# Patient Record
Sex: Female | Born: 1970 | Race: White
Health system: Southern US, Community
[De-identification: ages and names within clinical notes are randomized; demographics above are authoritative.]

## PROBLEM LIST (undated history)

## (undated) ENCOUNTER — Emergency Department (HOSPITAL_COMMUNITY): Admission: EM | Discharge status: Absent without leave | Payer: Self-pay

## (undated) DIAGNOSIS — F410 Panic disorder [episodic paroxysmal anxiety] without agoraphobia: Secondary | ICD-10-CM

## (undated) DIAGNOSIS — J449 Chronic obstructive pulmonary disease, unspecified: Secondary | ICD-10-CM

## (undated) DIAGNOSIS — F419 Anxiety disorder, unspecified: Secondary | ICD-10-CM

## (undated) DIAGNOSIS — M549 Dorsalgia, unspecified: Secondary | ICD-10-CM

## (undated) DIAGNOSIS — F32A Depression, unspecified: Secondary | ICD-10-CM

## (undated) DIAGNOSIS — G8929 Other chronic pain: Secondary | ICD-10-CM

## (undated) DIAGNOSIS — F329 Major depressive disorder, single episode, unspecified: Secondary | ICD-10-CM

## (undated) HISTORY — PX: APPENDECTOMY (OPEN): SHX54

## (undated) HISTORY — PX: ARTHROSCOPIC ASSISTED, KNEE, ANTERIOR CRUCIATE LIGAMENT (ACL) RECONSTRUCTION, ALLOGRAFT: SHX3152

## (undated) HISTORY — PX: CHOLECYSTECTOMY: SHX55

---
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
## 1998-11-20 DIAGNOSIS — G8929 Other chronic pain: Secondary | ICD-10-CM

## 1998-11-20 HISTORY — DX: Other chronic pain: G89.29

## 2001-11-20 HISTORY — PX: CHOLECYSTECTOMY: SHX55

## 2014-03-09 ENCOUNTER — Encounter (HOSPITAL_COMMUNITY): Payer: Self-pay | Admitting: Emergency Medicine

## 2014-03-09 ENCOUNTER — Emergency Department (HOSPITAL_COMMUNITY)
Admission: EM | Admit: 2014-03-09 | Discharge: 2014-03-09 | Disposition: A | Discharge status: Home / Self Care | Payer: Medicaid - Out of State | Attending: Emergency Medicine | Admitting: Emergency Medicine

## 2014-03-09 ENCOUNTER — Emergency Department (HOSPITAL_COMMUNITY): Payer: Medicaid - Out of State

## 2014-03-09 DIAGNOSIS — F411 Generalized anxiety disorder: Secondary | ICD-10-CM | POA: Insufficient documentation

## 2014-03-09 DIAGNOSIS — M25569 Pain in unspecified knee: Secondary | ICD-10-CM | POA: Insufficient documentation

## 2014-03-09 DIAGNOSIS — F17200 Nicotine dependence, unspecified, uncomplicated: Secondary | ICD-10-CM | POA: Insufficient documentation

## 2014-03-09 DIAGNOSIS — M898X9 Other specified disorders of bone, unspecified site: Secondary | ICD-10-CM | POA: Insufficient documentation

## 2014-03-09 DIAGNOSIS — F3289 Other specified depressive episodes: Secondary | ICD-10-CM | POA: Insufficient documentation

## 2014-03-09 DIAGNOSIS — F329 Major depressive disorder, single episode, unspecified: Secondary | ICD-10-CM | POA: Insufficient documentation

## 2014-03-09 DIAGNOSIS — M25469 Effusion, unspecified knee: Secondary | ICD-10-CM | POA: Insufficient documentation

## 2014-03-09 DIAGNOSIS — M779 Enthesopathy, unspecified: Secondary | ICD-10-CM

## 2014-03-09 DIAGNOSIS — G8929 Other chronic pain: Secondary | ICD-10-CM | POA: Insufficient documentation

## 2014-03-09 DIAGNOSIS — G8918 Other acute postprocedural pain: Secondary | ICD-10-CM | POA: Insufficient documentation

## 2014-03-09 DIAGNOSIS — M25561 Pain in right knee: Secondary | ICD-10-CM

## 2014-03-09 DIAGNOSIS — M76891 Other specified enthesopathies of right lower limb, excluding foot: Secondary | ICD-10-CM

## 2014-03-09 DIAGNOSIS — Z9889 Other specified postprocedural states: Secondary | ICD-10-CM | POA: Insufficient documentation

## 2014-03-09 HISTORY — DX: Major depressive disorder, single episode, unspecified: F32.9

## 2014-03-09 HISTORY — DX: Depression, unspecified: F32.A

## 2014-03-09 HISTORY — DX: Anxiety disorder, unspecified: F41.9

## 2014-03-09 HISTORY — DX: Dorsalgia, unspecified: M54.9

## 2014-03-09 HISTORY — DX: Other chronic pain: G89.29

## 2014-03-09 MED ORDER — OXYCODONE-ACETAMINOPHEN 5-325 MG PO TABS
2.0000 | ORAL_TABLET | Freq: Once | ORAL | Status: AC
  Administered 2014-03-09: 2 via ORAL
  Filled 2014-03-09: qty 2

## 2014-03-09 MED ORDER — OXYCODONE-ACETAMINOPHEN 5-325 MG PO TABS: 1.0000 | ORAL_TABLET | Freq: Four times a day (QID) | ORAL | Status: DC | PRN

## 2014-03-09 MED ORDER — MELOXICAM 7.5 MG PO TABS: 15.0000 mg | ORAL_TABLET | Freq: Every day | ORAL | Status: DC

## 2014-03-09 NOTE — Discharge Instructions (Signed)
°Emergency Department Resource Guide °1) Find a Doctor and Pay Out of Pocket °Although you won't have to find out who is covered by your insurance plan, it is a good idea to ask around and get recommendations. You will then need to call the office and see if the doctor you have chosen will accept you as a new patient and what types of options they offer for patients who are self-pay. Some doctors offer discounts or will set up payment plans for their patients who do not have insurance, but you will need to ask so you aren't surprised when you get to your appointment. ° °2) Contact Your Local Health Department °Not all health departments have doctors that can see patients for sick visits, but many do, so it is worth a call to see if yours does. If you don't know where your local health department is, you can check in your phone book. The CDC also has a tool to help you locate your state's health department, and many state websites also have listings of all of their local health departments. ° °3) Find a Walk-in Clinic °If your illness is not likely to be very severe or complicated, you may want to try a walk in clinic. These are popping up all over the country in pharmacies, drugstores, and shopping centers. They're usually staffed by nurse practitioners or physician assistants that have been trained to treat common illnesses and complaints. They're usually fairly quick and inexpensive. However, if you have serious medical issues or chronic medical problems, these are probably not your best option. ° °No Primary Care Doctor: °- Call [facility] at  832-8000 - they can help you locate a primary care doctor that  accepts your insurance, provides certain services, etc. °- Physician Referral Service- 1-800-533-3463 ° °Chronic Pain Problems: °Organization         Address  Phone   Notes  °[facility]  (336) 297-2271 Patients need to be referred by their primary care doctor.  ° °Medication  Assistance: °Organization         Address  Phone   Notes  °[place] [facility] [address] °[place], [place] [address] (336) 641-8030 --Must be a resident of [place] °-- Must have NO insurance coverage whatsoever (no Medicaid/ Medicare, etc.) °-- The pt. MUST have a primary care doctor that directs their care regularly and follows them in the community °  °[facility]  (866) 331-1348   °[employer]  (888) 892-1162   ° °Agencies that provide inexpensive medical care: °Organization         Address  Phone   Notes  °[facility]  (336) 832-8035   °[facility]    (336) 832-7272   °[facility] [address] °[place], [place] [address] (336) 832-4777   °[facility] [address], °[place] (336) 271-4999   °[facility]    (336) 373-0678   °[facility]    (336) 272-1050   °[facility] ° [address], [place] Phone:  (336) 832-4444, Fax:  (336) 832-4440 Hours of Operation:  9 am - 6 pm, M-F.  Also accepts Medicaid/Medicare and self-pay.  °[facility] ° [address], [place] Phone: (336) 832-3150, Fax: (336) 832-3151. Hours of Operation:  8:30 am - 5:30 pm, M-F.  Also accepts Medicaid and self-pay.  °[facility] [place] [address], [place] Phone: (336) 878-6027   °[facility] [address], [place], [place] (336)723-1848, Ext. 123 Mondays & Thursdays: 7-9 AM.  First 15 patients are seen on a first come, first serve basis. °  ° °Medicaid-accepting [place] Providers: ° °Organization         Address  Phone   Notes  °[facility] [address], [place] (336) 641-2100 Also accepts self-pay patients.  °[facility] [address], [place] ° (336) 856-9996   °[facility] [address], [place]  (336) 288-8857   °[facility] [address] [place] [address], [place] (336) 299-7000   °[facility] [address], [place]  ° (336) 373-1557 Only accepts [place] Access Medicaid patients after they have their name applied to their card.  ° °Self-Pay (no insurance) in [place]: ° °Organization         Address  Phone   Notes  °Sickle Cell Patients, [facility] [address], [place] (336) 832-1970   °[facility] [address], [place] (336) 832-4400   °[facility] ° [address], [place] (336) 992-4800   °[facility]/Dr. Osei-Bonsu ° [address] [place] [address], [place] or [address], [place] (336) 841-8500 Phone number for both [place] and [place] locations is the same.  °[facility] [address], [place] (336) 299-0000   °[facility] [address] [place] [address], [place] or [address] (336) 852-7530 °(336) 878-2260   °[facility] [address], [place] (336) 350-1642, phone; (336) 294-5005, fax Sees patients 1st and 3rd Saturday of every month.  Must not qualify for public or private insurance (i.e. Medicaid, Medicare, [place] Health Choice, Veterans' Benefits) • Household income should be no more than 200% of the poverty level •The clinic cannot treat you if you are pregnant or think you are pregnant • Sexually transmitted diseases are not treated at the clinic.  ° ° °Dental Care: °Organization         Address  Phone  Notes  °[place] [facility] [address], [place] (336) 641-6152 Accepts children up to age 21 who are enrolled in Medicaid or [place] Health Choice; pregnant women with a Medicaid card; and children who have applied for Medicaid or [place] Health Choice, but were declined, whose parents can pay a reduced fee at time of service.  °[place]  [facility] [place]  [address], [place] (336) 641-7733 Accepts children up to age 21 who are enrolled in Medicaid or [place] Health Choice; pregnant women with a Medicaid card; and children who have applied for Medicaid or [place] Health Choice, but were declined, whose parents can pay a reduced fee at time of service.  °[facility] ° [address], [place] (336) 641-4533 Patients are seen by appointment only. Walk-ins are not accepted. [facility] will see patients 18 years of age and older. °Monday - Tuesday (8am-5pm) °Most Wednesdays (8:30-5pm) °$30 per visit, cash only  °[facility] ° [address], [place] (336) 641-4533 Patients are seen by appointment only. Walk-ins are not accepted. [facility] will see patients 18 years of age and older. °One   Wednesday Evening (Monthly: Volunteer Based).  $30 per visit, cash only  °[facility]  (919) 537-3737 for adults; Children under age 4, call Graduate Pediatric Dentistry at (919) [phone]. Children aged 4-14, please call (919) 537-3737 to request a pediatric application. ° Dental services are provided in all areas of dental care including fillings, crowns and bridges, complete and partial dentures, implants, gum treatment, root canals, and extractions. Preventive care is also provided. Treatment is provided to both adults and children. °Patients are selected via a lottery and there is often a waiting list. °  °[facility] [address], °[place] ° (336) 763-8833 [email or web address] °  °[facility] [address], [place], [place] (336)723-1848, Ext. 123 Second and Fourth Thursday of each month, opens at 6:30 AM; Clinic ends at 9 AM.  Patients are seen on a first-come first-served basis, and a limited number are seen during each clinic.  ° °[facility] ° [address], [place], [place] (336) 723-7904    Eligibility Requirements °You must have lived in [place], [place], or [place] counties for at least the last three months. °  You cannot be eligible for state or federal sponsored healthcare insurance, including Veterans Administration, Medicaid, or Medicare. °  You generally cannot be eligible for healthcare insurance through your employer.  °  How to apply: °Eligibility screenings are held every Tuesday and Wednesday afternoon from 1:00 pm until 4:00 pm. You do not need an appointment for the interview!  °[facility] [address], [place], [place] 336-631-2330   °[facility]  336-342-8273   °[facility]  336-703-3100   °[facility]  336-570-6415   ° °Behavioral Health Resources in the Community: °Intensive Outpatient Programs °Organization         Address  Phone  Notes  °[facility] [address], [place], [place] 336-878-6098   °[facility] [address], [place], [place] 336-832-9800   °[facility] [address], [place], [place] ° 336-882-2125   °[facility] [address],  °[place], [place] 1-800-853-5163 or 336-641-4981   °Substance Abuse Resources °Organization         Address  Phone  Notes  °[facility]  336-882-2125   °[facility]  336-784-9470   °[facility]  336-285-9073   °[facility]  336-845-3988   °Residential & Outpatient Substance Abuse Program  1-800-659-3381   °Psychological Services °Organization         Address  Phone  Notes  °[facility]  336- 832-9600   °[facility]  336- 378-7881   °[facility] [address], [place] 1-800-853-5163 or 336-641-4981   ° °Mobile Crisis Teams °Organization         Address  Phone  Notes  °[facility], Mobile Crisis Care Unit  1-877-626-1772   °Assertive °[facility] ° [address]  [place], [place] 336-834-9664   °Sharon DeEsch [address] °[place] 336-554-5454   ° °Self-Help/Support Groups °Organization         Address  Phone             Notes  °[facility]  - variety of support groups  336- 373-1402 Call for more information  °Narcotics Anonymous (NA), [facility] [address], °[place]  2 meetings at this location  ° °  Residential Treatment Programs °Organization         Address  Phone  Notes  °[facility] [address],    °[place]  1-866-801-8205   °[facility] ° [address], [place], [place] 704-293-8524   °[facility] [address], [place] 336-845-3988 Admissions: 8am-3pm M-F  °[facility] [address]    °[place], [place] 336-841-1104   °[facility] [address], [place], [place] 336-379-7146   °[facility] [address]  °[place], [place] 336-285-9073   °[facility] Intensive Outpatient [address], [place], [place] 336-852-3033   °[facility] [address]  °[place], [place] 1-877-615-2722 or 336-784-9470   °[facility] [address] [place], [place] 336-227-7417 Accepts Medicaid  °[facility] [address]  °[place] 1-800-659-3381 Substance Abuse/Addiction Treatment  ° °[place] Behavioral Health Resources °Organization         Address  Phone  Notes  °[facility]  (888) 581-9988   °Julie Brannon, PhD [address] [place], [place]   (336) 349-5553 or (336) 951-0000   °[facility]   [address] °[place], [place] (336) 349-4454   °[facility] [address], [place], [place] (336) 342-8316 Insurance/Medicaid/sponsorship through [facility]  °[facility] [address]                                    [place], [place] (336) 342-8316 Therapy/tele-psych/case    °[facility] [address]  ° [place],  (336) 349-2233    °Dr. Arfeen  (336) 349-4544   °[facility] [place]  [employer] [place] [facility] 1) [address], [place] °2) [address], [place] °3)  [address], [place] (336) 349-3220 °(336) 342-7768 ° °(336) 342-8140   °[place] [facility] (336) 342-1394 or (336) 342-3537 (After Hours)    ° ° °

## 2014-03-09 NOTE — ED Notes (Signed)
Per pt sts right knee pain. sts she had arthroscopic surgery over a month ago and is still having pain. sts she sees orthopedic on the 29th. sts she feels something moving, its swollen and burning.

## 2014-03-09 NOTE — ED Notes (Signed)
Triage done by Dahlia Byesraci Kaisyn Reinhold RN

## 2014-03-09 NOTE — ED Provider Notes (Signed)
CSN: [id number]     Arrival date & time 03/09/14  1721 History  This chart was scribed for non-physician practitioner Johney MaineErin O'Mallye, PA-C working with Dagmar HaitWilliam Blair Walden, MD by Joaquin MusicKristina Sanchez-Matthews, ED Scribe. This patient was seen in room TR04C/TR04C and the patient's care was started at 8:42 PM .   Chief Complaint  Patient presents with  . Knee Pain   The history is provided by the patient. No language interpreter was used.   HPI Comments: Buren KosDeborah Debski is a 43 y.o. female who presents to the Emergency Department complaining of ongoing R knee pain due to post-surgery over a month ago. Pt reports having arthroscopic surgery to R knee surgery for a bone spur [place] . [place] and does not have a local orthopedist yet. She states she is having sharp constant pain, 9/10, worse with ambulation. She denies having a knee brace but states she was informed to use crutches for a week, however a nurse told her just 3 days.  Her surgeon got upset, and she began using the crutches again for 2 weeks. Pt states she has taken OTC Advil and denies relief. Pt states she has chronic back pain and reports taking Percocet 15 but states she does not have her medications at this time. She denies allergies to medications. Pt denies any recent falls and injuries.  Past Medical History  Diagnosis Date  . Chronic back pain   . Depression   . Anxiety    Past Surgical History  Procedure Laterality Date  . Cholecystectomy    . Appendectomy     History reviewed. No pertinent family history. History  Substance Use Topics  . Smoking status: Current Every Day Smoker  . Smokeless tobacco: Not on file  . Alcohol Use: No   OB History   Grav Para Term Preterm Abortions TAB SAB Ect Mult Living                 Review of Systems  Musculoskeletal: Positive for gait problem.  Skin: Negative for color change and wound.   Allergies  Review of patient's  allergies indicates no known allergies.  Home Medications   Prior to Admission medications   Medication Sig Start Date End Date Taking? Authorizing Provider  buPROPion (WELLBUTRIN XL) 300 MG 24 hr tablet Take 300 mg by mouth daily.   Yes Historical Provider, MD  oxybutynin (DITROPAN-XL) 10 MG 24 hr tablet Take 10 mg by mouth daily.   Yes Historical Provider, MD  oxyCODONE (ROXICODONE) 15 MG immediate release tablet Take 15 mg by mouth every 4 (four) hours as needed for pain.   Yes Historical Provider, MD  risperiDONE (RISPERDAL) 0.5 MG tablet Take 0.5 mg by mouth daily.   Yes Historical Provider, MD  sertraline (ZOLOFT) 100 MG tablet Take 100 mg by mouth 2 (two) times daily.   Yes Historical Provider, MD   BP 120/67  Pulse 58  Temp(Src) 98.3 F (36.8 C)  Resp 16  Wt 247 lb 3 oz (112.124 kg)  SpO2 100%  LMP 02/16/2014  Physical Exam  Nursing note and vitals reviewed. Constitutional: She is oriented to person, place, and time. She appears well-developed and well-nourished.  Morbidly obese patient.   HENT:  Head: Normocephalic and atraumatic.  Eyes: EOM are normal.  Neck: Normal range of motion.  Cardiovascular: Normal rate.   Pulmonary/Chest: Effort normal.  Musculoskeletal: Normal range of motion.  R knee, moderate edema. 3 well  healing surgical incision consistent with arthroscopic surgery. No evidence of underlying infection. Circumferential tenderness worse in medial and lateral joint lines. Antalgic gait. Pedal pulse 2+. Sensation intact.   Neurological: She is alert and oriented to person, place, and time.  Skin: Skin is warm and dry.  Psychiatric: She has a normal mood and affect. Her behavior is normal.    ED Course  Procedures  DIAGNOSTIC STUDIES: Oxygen Saturation is 98% on RA, normal by my interpretation.    COORDINATION OF CARE: 8:46 PM-Discussed treatment plan which includes discussed radiology findings, will order crutches and knee sleeve. Advised pt to F/U  with orthopedist and will provide a resource guide for pt. Pt agreed to plan.   Labs Review Labs Reviewed - No data to display  Imaging Review Dg Knee Complete 4 Views Right  03/09/2014   CLINICAL DATA:  Right knee pain, swelling  EXAM: RIGHT KNEE - COMPLETE 4+ VIEW  COMPARISON:  None.  FINDINGS: Four views of the right knee submitted. Narrowing of medial joint compartment. Spurring of medial and lateral tibial plateau. Significant spurring of femoral condyles. Small joint effusion. Narrowing of patellofemoral joint space. Spurring of patella. No acute fracture or subluxation.  IMPRESSION: No acute fracture or subluxation. Osteoarthritic changes as described above.   Electronically Signed   By: Natasha MeadLiviu  Pop M.D.   On: 03/09/2014 20:39     EKG Interpretation None     MDM   Final diagnoses:  Right knee pain  Bone spur of right femur  Bone spur    Pt presenting to ED c/o right knee pain 18mo after arthroscopic surgery due to bone spurs in her knee. Denies new trauma to knee.  Out of pain medication. On exam, knee is tender and swollen, however, no evidence of underlying infection. Plain films: no acute fracture or subluxation. OA changes including multiple bone spurs. [facility].  Return precautions provided. Pt verbalized understanding and agreement with tx plan.   I personally performed the services described in this documentation, which was scribed in my presence. The recorded information has been reviewed and is accurate.   Junius FinnerErin O'Malley, PA-C 03/11/14 [phone]

## 2014-03-12 NOTE — ED Provider Notes (Signed)
Medical screening examination/treatment/procedure(s) were performed by non-physician practitioner and as supervising physician I was immediately available for consultation/collaboration.   EKG Interpretation None        William Arles Rumbold, MD 03/12/14 1557 

## 2014-03-16 ENCOUNTER — Encounter (HOSPITAL_COMMUNITY): Payer: Self-pay | Admitting: Emergency Medicine

## 2014-03-16 ENCOUNTER — Emergency Department (HOSPITAL_COMMUNITY)
Admission: EM | Admit: 2014-03-16 | Discharge: 2014-03-16 | Disposition: A | Discharge status: Home / Self Care | Payer: Medicaid - Out of State | Attending: Emergency Medicine | Admitting: Emergency Medicine

## 2014-03-16 DIAGNOSIS — L089 Local infection of the skin and subcutaneous tissue, unspecified: Secondary | ICD-10-CM | POA: Insufficient documentation

## 2014-03-16 DIAGNOSIS — F329 Major depressive disorder, single episode, unspecified: Secondary | ICD-10-CM | POA: Insufficient documentation

## 2014-03-16 DIAGNOSIS — K089 Disorder of teeth and supporting structures, unspecified: Secondary | ICD-10-CM | POA: Insufficient documentation

## 2014-03-16 DIAGNOSIS — F3289 Other specified depressive episodes: Secondary | ICD-10-CM | POA: Insufficient documentation

## 2014-03-16 DIAGNOSIS — K029 Dental caries, unspecified: Secondary | ICD-10-CM | POA: Insufficient documentation

## 2014-03-16 DIAGNOSIS — R110 Nausea: Secondary | ICD-10-CM | POA: Insufficient documentation

## 2014-03-16 DIAGNOSIS — Y92009 Unspecified place in unspecified non-institutional (private) residence as the place of occurrence of the external cause: Secondary | ICD-10-CM | POA: Insufficient documentation

## 2014-03-16 DIAGNOSIS — Z791 Long term (current) use of non-steroidal anti-inflammatories (NSAID): Secondary | ICD-10-CM | POA: Insufficient documentation

## 2014-03-16 DIAGNOSIS — Z79899 Other long term (current) drug therapy: Secondary | ICD-10-CM | POA: Insufficient documentation

## 2014-03-16 DIAGNOSIS — S20169A Insect bite (nonvenomous) of breast, unspecified breast, initial encounter: Secondary | ICD-10-CM

## 2014-03-16 DIAGNOSIS — K0889 Other specified disorders of teeth and supporting structures: Secondary | ICD-10-CM

## 2014-03-16 DIAGNOSIS — W57XXXA Bitten or stung by nonvenomous insect and other nonvenomous arthropods, initial encounter: Secondary | ICD-10-CM

## 2014-03-16 DIAGNOSIS — Y939 Activity, unspecified: Secondary | ICD-10-CM | POA: Insufficient documentation

## 2014-03-16 DIAGNOSIS — F411 Generalized anxiety disorder: Secondary | ICD-10-CM | POA: Insufficient documentation

## 2014-03-16 DIAGNOSIS — G8929 Other chronic pain: Secondary | ICD-10-CM | POA: Insufficient documentation

## 2014-03-16 DIAGNOSIS — F17200 Nicotine dependence, unspecified, uncomplicated: Secondary | ICD-10-CM | POA: Insufficient documentation

## 2014-03-16 DIAGNOSIS — S90569A Insect bite (nonvenomous), unspecified ankle, initial encounter: Secondary | ICD-10-CM | POA: Insufficient documentation

## 2014-03-16 MED ORDER — DOXYCYCLINE HYCLATE 100 MG PO CAPS: 100.0000 mg | ORAL_CAPSULE | Freq: Two times a day (BID) | ORAL | Status: DC

## 2014-03-16 MED ORDER — HYDROCORTISONE 1 % EX CREA: TOPICAL_CREAM | CUTANEOUS | Status: DC

## 2014-03-16 MED ORDER — OXYCODONE-ACETAMINOPHEN 5-325 MG PO TABS: 1.0000 | ORAL_TABLET | Freq: Four times a day (QID) | ORAL | Status: DC | PRN

## 2014-03-16 NOTE — ED Notes (Signed)
Pt reports left sided toothache that she thinks is infected. Reports that she also has bug bites to the back and stomach.

## 2014-03-16 NOTE — Discharge Instructions (Signed)
Dental Pain A tooth ache may be caused by cavities (tooth decay). Cavities expose the nerve of the tooth to air and hot or cold temperatures. It may come from an infection or abscess (also called a boil or furuncle) around your tooth. It is also often caused by dental caries (tooth decay). This causes the pain you are having. DIAGNOSIS  Your caregiver can diagnose this problem by exam. TREATMENT   If caused by an infection, it may be treated with medications which kill germs (antibiotics) and pain medications as prescribed by your caregiver. Take medications as directed.  Only take over-the-counter or prescription medicines for pain, discomfort, or fever as directed by your caregiver.  Whether the tooth ache today is caused by infection or dental disease, you should see your dentist as soon as possible for further care. SEEK MEDICAL CARE IF: The exam and treatment you received today has been provided on an emergency basis only. This is not a substitute for complete medical or dental care. If your problem worsens or new problems (symptoms) appear, and you are unable to meet with your dentist, call or return to this location. SEEK IMMEDIATE MEDICAL CARE IF:   You have a fever.  You develop redness and swelling of your face, jaw, or neck.  You are unable to open your mouth.  You have severe pain uncontrolled by pain medicine. MAKE SURE YOU:   Understand these instructions.  Will watch your condition.  Will get help right away if you are not doing well or get worse. Document Released: 11/06/2005 Document Revised: 01/29/2012 Document Reviewed: 06/24/2008 [facility] Patient Information 2014 [place] [employer], [place].  Insect Bite Mosquitoes, flies, fleas, bedbugs, and many other insects can bite. Insect bites are different from insect stings. A sting is when venom is injected into the skin. Some insect bites can transmit infectious diseases. SYMPTOMS  Insect bites usually turn red, swell, and itch  for 2 to 4 days. They often go away on their own. TREATMENT  Your caregiver may prescribe antibiotic medicines if a bacterial infection develops in the bite. HOME CARE INSTRUCTIONS  Do not scratch the bite area.  Keep the bite area clean and dry. Wash the bite area thoroughly with soap and water.  Put ice or cool compresses on the bite area.  Put ice in a plastic bag.  Place a towel between your skin and the bag.  Leave the ice on for 20 minutes, 4 times a day for the first 2 to 3 days, or as directed.  You may apply a baking soda paste, cortisone cream, or calamine lotion to the bite area as directed by your caregiver. This can help reduce itching and swelling.  Only take over-the-counter or prescription medicines as directed by your caregiver.  If you are given antibiotics, take them as directed. Finish them even if you start to feel better. You may need a tetanus shot if:  You cannot remember when you had your last tetanus shot.  You have never had a tetanus shot.  The injury broke your skin. If you get a tetanus shot, your arm may swell, get red, and feel warm to the touch. This is common and not a problem. If you need a tetanus shot and you choose not to have one, there is a rare chance of getting tetanus. Sickness from tetanus can be serious. SEEK IMMEDIATE MEDICAL CARE IF:   You have increased pain, redness, or swelling in the bite area.  You see a red line on  the skin coming from the bite.  You have a fever.  You have joint pain.  You have a headache or neck pain.  You have unusual weakness.  You have a rash.  You have chest pain or shortness of breath.  You have abdominal pain, nausea, or vomiting.  You feel unusually tired or sleepy. MAKE SURE YOU:   Understand these instructions.  Will watch your condition.  Will get help right away if you are not doing well or get worse. Document Released: 12/14/2004 Document Revised: 01/29/2012 Document Reviewed:  06/07/2011 [facility] Patient Information 2014 [place], [place].

## 2014-03-16 NOTE — ED Notes (Signed)
Ppt. Stated, [place] are [place] from [place] and have some type of bites on my back and stomach and nobody else in the house has the problem.  My teeth started hurting 2 days ago and went through to tubes of oral jel with no help.

## 2014-03-16 NOTE — ED Provider Notes (Signed)
CSN: [id number]     Arrival date & time 03/16/14  1535 History  This chart was scribed for non-physician practitioner, Marlon Peliffany Desmon Hitchner, PA-C,working with Ethelda ChickMartha K Linker, MD, by Karle PlumberJennifer Tensley, ED Scribe.  This patient was seen in room TR07C/TR07C and the patient's care was started at 5:10 PM.  Chief Complaint  Patient presents with  . Dental Pain  . Insect Bite   The history is provided by the patient. No language interpreter was used.   HPI Comments:  Lauren Flynn is a 43 y.o. female with h/o chronic back pain who presents to the Emergency Department complaining of new onset severe upper and lower left-sided dental pain that started approximately two days ago. She reports associated nausea. She states she has been using Orajel with no relief. She states she is taking her homes meds for pain.  She also complains of multiple insect bites to her abdomen, back, and hips. She states [place] here [place] and is staying with friends. She reports seeing multiple ticks and spiders in and around the house. She denies vomiting, diarrhea, or fever. She denies allergies to any medications.  Past Medical History  Diagnosis Date  . Chronic back pain   . Depression   . Anxiety    Past Surgical History  Procedure Laterality Date  . Cholecystectomy    . Appendectomy     History reviewed. No pertinent family history. History  Substance Use Topics  . Smoking status: Current Every Day Smoker -- 0.50 packs/day    Types: Cigarettes  . Smokeless tobacco: Not on file  . Alcohol Use: Yes   OB History   Grav Para Term Preterm Abortions TAB SAB Ect Mult Living                 Review of Systems  Constitutional: Negative for fever.  HENT: Positive for dental problem.   Gastrointestinal: Positive for nausea. Negative for vomiting and diarrhea.  Skin: Positive for rash (insect bites to back and stomach).    Allergies  Review of patient's allergies indicates no known  allergies.  Home Medications   Prior to Admission medications   Medication Sig Start Date End Date Taking? Authorizing Provider  buPROPion (WELLBUTRIN XL) 300 MG 24 hr tablet Take 300 mg by mouth daily.    Historical Provider, MD  meloxicam (MOBIC) 7.5 MG tablet Take 2 tablets (15 mg total) by mouth daily. 03/09/14   Junius FinnerErin O'Malley, PA-C  oxybutynin (DITROPAN-XL) 10 MG 24 hr tablet Take 10 mg by mouth daily.    Historical Provider, MD  oxyCODONE (ROXICODONE) 15 MG immediate release tablet Take 15 mg by mouth every 4 (four) hours as needed for pain.    Historical Provider, MD  oxyCODONE-acetaminophen (PERCOCET/ROXICET) 5-325 MG per tablet Take 1-2 tablets by mouth every 6 (six) hours as needed for severe pain. 03/09/14   Junius FinnerErin O'Malley, PA-C  risperiDONE (RISPERDAL) 0.5 MG tablet Take 0.5 mg by mouth daily.    Historical Provider, MD  sertraline (ZOLOFT) 100 MG tablet Take 100 mg by mouth 2 (two) times daily.    Historical Provider, MD   Triage Vitals: BP 123/70  Pulse 71  Temp(Src) 98.5 F (36.9 C) (Oral)  Resp 16  Ht 5\' 8"  (1.727 m)  Wt 247 lb (112.038 kg)  BMI 37.56 kg/m2  SpO2 98%  LMP 02/09/2014 Physical Exam  Nursing note and vitals reviewed. Constitutional: She is oriented to person, place, and time. She appears well-developed and well-nourished.  HENT:  Head: Normocephalic and atraumatic.  Widespread dental decay. No obvious abscesses or trismus noted.  Eyes: EOM are normal.  Neck: Normal range of motion.  Cardiovascular: Normal rate.   Pulmonary/Chest: Effort normal.  Musculoskeletal: Normal range of motion.  Neurological: She is alert and oriented to person, place, and time.  Skin: Skin is warm and dry.  3 insect bites along bra line. Scabbed, excoriated and associated cellulitis. Multiple bites to bilateral hips.  Psychiatric: She has a normal mood and affect. Her behavior is normal.    ED Course  NERVE BLOCK Date/Time: 03/17/2014 9:16 PM Performed by: Dorthula MatasGREENE, Kairos Panetta  G Authorized by: Dorthula MatasGREENE, Shaqueena Mauceri G Consent: Verbal consent obtained. Risks and benefits: risks, benefits and alternatives were discussed Consent given by: patient Indications: pain relief Body area: face/mouth Laterality: right Patient sedated: no Needle gauge: 24 G Local anesthetic: bupivacaine 0.25% with epinephrine Anesthetic total: 2 ml Outcome: pain improved Patient tolerance: Patient tolerated the procedure well with no immediate complications.   (including critical care time) DIAGNOSTIC STUDIES: Oxygen Saturation is 98% on RA, normal by my interpretation.   COORDINATION OF CARE: 5:19 PM- Will prescribe pain medication, antibiotic, and hydrocortisone cream and administer dental block. Pt verbalizes understanding and agrees to plan.  Medications - No data to display  Labs Review Labs Reviewed - No data to display  Imaging Review No results found.   EKG Interpretation None      MDM   Final diagnoses:  Pain, dental  Insect bite    Patient has dental pain. No emergent s/sx's present. Patent airway. No trismus.  Will be given pain medication and antibiotics.  Dental referral given. Return to ED precautions given.  Pt voiced understanding and has agreed to follow-up.   43 y.o.Lauren Flynn's evaluation in the Emergency Department is complete. It has been determined that no acute conditions requiring further emergency intervention are present at this time. The patient/guardian have been advised of the diagnosis and plan. We have discussed signs and symptoms that warrant return to the ED, such as changes or worsening in symptoms.  Vital signs are stable at discharge. Filed Vitals:   03/16/14 1541  BP: 123/70  Pulse: 71  Temp: 98.5 F (36.9 C)  Resp: 16    Patient/guardian has voiced understanding and agreed to follow-up with the PCP or specialist.   I personally performed the services described in this documentation, which was scribed in my presence. The  recorded information has been reviewed and is accurate.    Dorthula Matasiffany G Ennis Heavner, PA-C 03/17/14 2116

## 2014-03-17 NOTE — ED Provider Notes (Signed)
Medical screening examination/treatment/procedure(s) were performed by non-physician practitioner and as supervising physician I was immediately available for consultation/collaboration.   EKG Interpretation None       Martha K Linker, MD 03/17/14 2118 

## 2014-08-05 ENCOUNTER — Emergency Department (HOSPITAL_COMMUNITY): Payer: Medicaid - Out of State

## 2014-08-05 ENCOUNTER — Encounter (HOSPITAL_COMMUNITY): Payer: Self-pay | Admitting: Emergency Medicine

## 2014-08-05 ENCOUNTER — Emergency Department (HOSPITAL_COMMUNITY)
Admission: EM | Admit: 2014-08-05 | Discharge: 2014-08-06 | Disposition: A | Discharge status: Home / Self Care | Payer: Medicaid - Out of State | Attending: Emergency Medicine | Admitting: Emergency Medicine

## 2014-08-05 DIAGNOSIS — M25559 Pain in unspecified hip: Secondary | ICD-10-CM | POA: Diagnosis not present

## 2014-08-05 DIAGNOSIS — F329 Major depressive disorder, single episode, unspecified: Secondary | ICD-10-CM | POA: Insufficient documentation

## 2014-08-05 DIAGNOSIS — G8929 Other chronic pain: Secondary | ICD-10-CM | POA: Diagnosis not present

## 2014-08-05 DIAGNOSIS — M5430 Sciatica, unspecified side: Secondary | ICD-10-CM | POA: Diagnosis not present

## 2014-08-05 DIAGNOSIS — F17200 Nicotine dependence, unspecified, uncomplicated: Secondary | ICD-10-CM | POA: Diagnosis not present

## 2014-08-05 DIAGNOSIS — M25569 Pain in unspecified knee: Secondary | ICD-10-CM | POA: Insufficient documentation

## 2014-08-05 DIAGNOSIS — Z79899 Other long term (current) drug therapy: Secondary | ICD-10-CM | POA: Diagnosis not present

## 2014-08-05 DIAGNOSIS — F411 Generalized anxiety disorder: Secondary | ICD-10-CM | POA: Insufficient documentation

## 2014-08-05 DIAGNOSIS — M25511 Pain in right shoulder: Secondary | ICD-10-CM

## 2014-08-05 DIAGNOSIS — M25551 Pain in right hip: Secondary | ICD-10-CM

## 2014-08-05 DIAGNOSIS — M25561 Pain in right knee: Secondary | ICD-10-CM

## 2014-08-05 DIAGNOSIS — M25519 Pain in unspecified shoulder: Secondary | ICD-10-CM | POA: Diagnosis not present

## 2014-08-05 DIAGNOSIS — F3289 Other specified depressive episodes: Secondary | ICD-10-CM | POA: Diagnosis not present

## 2014-08-05 DIAGNOSIS — M5441 Lumbago with sciatica, right side: Secondary | ICD-10-CM

## 2014-08-05 DIAGNOSIS — M25469 Effusion, unspecified knee: Secondary | ICD-10-CM | POA: Diagnosis not present

## 2014-08-05 DIAGNOSIS — M79609 Pain in unspecified limb: Secondary | ICD-10-CM | POA: Diagnosis not present

## 2014-08-05 MED ORDER — INDOMETHACIN 50 MG PO CAPS: 50.0000 mg | ORAL_CAPSULE | Freq: Three times a day (TID) | ORAL | Status: DC | PRN

## 2014-08-05 MED ORDER — OXYCODONE-ACETAMINOPHEN 5-325 MG PO TABS: 1.0000 | ORAL_TABLET | ORAL | Status: DC | PRN

## 2014-08-05 MED ORDER — OXYCODONE-ACETAMINOPHEN 5-325 MG PO TABS
1.0000 | ORAL_TABLET | Freq: Once | ORAL | Status: AC
  Administered 2014-08-05: 1 via ORAL
  Filled 2014-08-05: qty 1

## 2014-08-05 MED ORDER — NAPROXEN 500 MG PO TABS: 500.0000 mg | ORAL_TABLET | Freq: Two times a day (BID) | ORAL | Status: DC

## 2014-08-05 NOTE — Discharge Instructions (Signed)
Naprosyn for pain and inflammation. Percocet for severe pain. Rest. Ice your knee and shoulder several times a day. Keep elevated. Follow up with primary care doctor.   Chronic Pain Chronic pain can be defined as pain that is off and on and lasts for 3-6 months or longer. Many things cause chronic pain, which can make it difficult to make a diagnosis. There are many treatment options available for chronic pain. However, finding a treatment that works well for you may require trying various approaches until the right one is found. Many people benefit from a combination of two or more types of treatment to control their pain. SYMPTOMS  Chronic pain can occur anywhere in the body and can range from mild to very severe. Some types of chronic pain include:  Headache.  Low back pain.  Cancer pain.  Arthritis pain.  Neurogenic pain. This is pain resulting from damage to nerves. People with chronic pain may also have other symptoms such as:  Depression.  Anger.  Insomnia.  Anxiety. DIAGNOSIS  Your health care provider will help diagnose your condition over time. In many cases, the initial focus will be on excluding possible conditions that could be causing the pain. Depending on your symptoms, your health care provider may order tests to diagnose your condition. Some of these tests may include:   Blood tests.   CT scan.   MRI.   X-rays.   Ultrasounds.   Nerve conduction studies.  You may need to see a specialist.  TREATMENT  Finding treatment that works well may take time. You may be referred to a pain specialist. He or she may prescribe medicine or therapies, such as:   Mindful meditation or yoga.  Shots (injections) of numbing or pain-relieving medicines into the spine or area of pain.  Local electrical stimulation.  Acupuncture.   Massage therapy.   Aroma, color, light, or sound therapy.   Biofeedback.   Working with a physical therapist to keep from  getting stiff.   Regular, gentle exercise.   Cognitive or behavioral therapy.   Group support.  Sometimes, surgery may be recommended.  HOME CARE INSTRUCTIONS   Take all medicines as directed by your health care provider.   Lessen stress in your life by relaxing and doing things such as listening to calming music.   Exercise or be active as directed by your health care provider.   Eat a healthy diet and include things such as vegetables, fruits, fish, and lean meats in your diet.   Keep all follow-up appointments with your health care provider.   Attend a support group with others suffering from chronic pain. SEEK MEDICAL CARE IF:   Your pain gets worse.   You develop a new pain that was not there before.   You cannot tolerate medicines given to you by your health care provider.   You have new symptoms since your last visit with your health care provider.  SEEK IMMEDIATE MEDICAL CARE IF:   You feel weak.   You have decreased sensation or numbness.   You lose control of bowel or bladder function.   Your pain suddenly gets much worse.   You develop shaking.  You develop chills.  You develop confusion.  You develop chest pain.  You develop shortness of breath.  MAKE SURE YOU:  Understand these instructions.  Will watch your condition.  Will get help right away if you are not doing well or get worse. Document Released: 07/29/2002 Document Revised: 07/09/2013 Document  Reviewed: 05/02/2013 ExitCare Patient Information 2015 [place], [place]. This information is not intended to replace advice given to you by your health care provider. Make sure you discuss any questions you have with your health care provider.

## 2014-08-05 NOTE — ED Notes (Signed)
Patient states that she twisted wrong today and hurt her right knee.  She is also complaining of lower back pain, right hip and right arm pain.  No injuries to the back, hip or arm that patient can remember, except for twisting.  She states that she feels like when she walks, sometimes her hip "pops" out and she can't really walk.  Patient was ambulatory into triage room.

## 2014-08-05 NOTE — ED Provider Notes (Signed)
CSN: [id number]     Arrival date & time 08/05/14  2156 History   First MD Initiated Contact with Patient 08/05/14 2218     This chart was scribed for non-physician practitioner Jaynie Crumble, PA-C,  working with Rolland Porter, MD by Gwenevere Abbot, ED scribe. This patient was seen in room TR07C/TR07C and the patient's care was started at 10:52 PM.  Chief Complaint  Patient presents with  . Knee Pain  . Back Pain  . Arm Pain  . Hip Pain     The history is provided by the patient. No language interpreter was used.    HPI Comments:  Lauren Flynn is a 43 y.o. female who presents to the Emergency Department complaining of chronic right knee, right hip, right shoulder, and back pain. Pt reports that she did twist her knee on yesterday, when attempting to go down the steps. Pt reports that she had surgery in March 2015, and since then has developed 5 bone spurs. Pt has also experienced swelling of the right knee for the past two weeks. Pt reports that she was in pain management in [place], and was prescribed Hydrocodone, but finished the prescription last month. Pt reports taking ibuprofen, without relief. Pt denies incontinence of urine or bowel. Pt denies chills, fever, nausea, or vomiting. Pt reports that her family recently moved from [place], and she has not yet established care with an orthopedist in [place].   Past Medical History  Diagnosis Date  . Chronic back pain   . Depression   . Anxiety    Past Surgical History  Procedure Laterality Date  . Cholecystectomy    . Appendectomy     History reviewed. No pertinent family history. History  Substance Use Topics  . Smoking status: Current Every Day Smoker -- 0.50 packs/day    Types: Cigarettes  . Smokeless tobacco: Not on file  . Alcohol Use: Yes   OB History   Grav Para Term Preterm Abortions TAB SAB Ect Mult Living                 Review of Systems  Constitutional: Negative for fever and chills.   Gastrointestinal: Negative for nausea and vomiting.  Musculoskeletal: Positive for arthralgias, back pain, joint swelling and myalgias.      Allergies  Review of patient's allergies indicates no known allergies.  Home Medications   Prior to Admission medications   Medication Sig Start Date End Date Taking? Authorizing Provider  buPROPion (WELLBUTRIN XL) 300 MG 24 hr tablet Take 300 mg by mouth daily.   Yes Historical Provider, MD  oxybutynin (DITROPAN-XL) 10 MG 24 hr tablet Take 10 mg by mouth daily.   Yes Historical Provider, MD  risperiDONE (RISPERDAL) 0.5 MG tablet Take 0.5 mg by mouth daily.   Yes Historical Provider, MD  sertraline (ZOLOFT) 100 MG tablet Take 100 mg by mouth 2 (two) times daily.   Yes Historical Provider, MD   BP 106/60  Pulse 68  Temp(Src) 97.6 F (36.4 C) (Oral)  Resp 16  Ht  (1.753 m)  Wt 234 lb (106.142 kg)  BMI 34.54 kg/m2  SpO2 98%  LMP 06/28/2014 Physical Exam  Nursing note and vitals reviewed. Constitutional: She is oriented to person, place, and time. She appears well-developed and well-nourished.  HENT:  Head: Normocephalic and atraumatic.  Eyes: EOM are normal.  Neck: Normal range of motion. Neck supple.  Cardiovascular: Normal rate.   Pulmonary/Chest: Effort normal.  Musculoskeletal: Normal range of motion.  Diffuse tenderness over right shoulder. Full range of motion passively, unable to raise arm past 90 actively. Bicep,, deltoid strength intact, good strength with resistance. Pain with external and internal rotation actively and passively. Distal radial pulse intact. Tenderness to midline lumbar spine and right SI joint. Tenderness to the right hip. Forward motion of the hip. Pain with right straight leg raise. Right knee is diffusely tender. Not warm to the touch, no erythema or any other discoloration. Limited range of motion due to pain. Tear posterior drawer signs are negative. No laxity with medial lateral stress. Difficult exam  and do to patient's body habitus and acute pain.  Neurological: She is alert and oriented to person, place, and time.  Skin: Skin is warm and dry.  Psychiatric: She has a normal mood and affect. Her behavior is normal.    ED Course  Procedures  DIAGNOSTIC STUDIES: Oxygen Saturation is 98% on RA, normal by my interpretation.  COORDINATION OF CARE: 10:58 PM-Discussed treatment plan which includes management of the pain with pt at bedside and pt agreed to plan.  Labs Review Labs Reviewed - No data to display  Imaging Review Dg Hip Complete Right  08/05/2014   CLINICAL DATA:  Right hip pain laterally for 3 weeks, no trauma  EXAM: RIGHT HIP - COMPLETE 2+ VIEW  COMPARISON:  None.  FINDINGS: There is no evidence of hip fracture or dislocation. There is no evidence of arthropathy or other focal bone abnormality. Bilateral mild hip degenerative change noted.  IMPRESSION: Negative.   Electronically Signed   By: Christiana Pellant M.D.   On: 08/05/2014 22:32   Dg Knee Complete 4 Views Right  08/05/2014   CLINICAL DATA:  Right knee pain and swelling.  EXAM: RIGHT KNEE - COMPLETE 4+ VIEW  COMPARISON:  Right knee radiographs performed 03/09/2014  FINDINGS: There is no evidence of fracture or dislocation. Mild narrowing is noted at the medial and patellofemoral compartments. There is slight cortical irregularity at the lateral compartment. Marginal osteophytes are seen arising at all three compartments.  Trace joint fluid remains within normal limits. The visualized soft tissues are normal in appearance.  IMPRESSION: No evidence of fracture or dislocation. Mild tricompartmental osteoarthritis noted.   Electronically Signed   By: Roanna Raider M.D.   On: 08/05/2014 22:32     EKG Interpretation None      MDM   Final diagnoses:  Right shoulder pain  Right hip pain  Right knee pain  Right-sided low back pain with right-sided sciatica     Patient with multiple joint pain, no acute injuries. Patient  admitted that she used to be in pain management, but just moved to this area and has not found a doctor yet. She has been out of her Percocet for months. She's requesting more pain medications. I do not think any of her complaints or acute. X-rays of the knee and hip obtained and were ordered in triage, both negative. No signs of infection in any of the joints. Vital signs are normal. Patient is stable for discharge home, I will give her 20 tablets of Percocet until she is able to followup with her primary care Dr. pain management. Patient also requested Indocin which will be prescribed. Return precautions discussed  Filed Vitals:   08/05/14 2210 08/05/14 2347  BP: 106/60 116/67  Pulse: 68 66  Temp: 97.6 F (36.4 C)   TempSrc: Oral   Resp: 16 18  Height:  (1.753 m)   Weight: 234 lb (  106.142 kg)   SpO2: 98% 99%     I personally performed the services described in this documentation, which was scribed in my presence. The recorded information has been reviewed and is accurate.    Lottie Mussel, PA-C 08/06/14 0011

## 2014-08-13 NOTE — ED Provider Notes (Signed)
Medical screening examination/treatment/procedure(s) were performed by non-physician practitioner and as supervising physician I was immediately available for consultation/collaboration.   EKG Interpretation None        Destani Wamser, MD 08/13/14 0701 

## 2014-08-31 ENCOUNTER — Ambulatory Visit: Payer: Medicaid Other | Attending: Family Medicine | Admitting: Family Medicine

## 2014-08-31 ENCOUNTER — Encounter: Payer: Self-pay | Admitting: Family Medicine

## 2014-08-31 VITALS — BP 94/65 | HR 72 | Temp 98.7°F | Resp 18 | Ht 69.0 in | Wt 237.0 lb

## 2014-08-31 DIAGNOSIS — M25562 Pain in left knee: Secondary | ICD-10-CM

## 2014-08-31 DIAGNOSIS — M25569 Pain in unspecified knee: Secondary | ICD-10-CM | POA: Insufficient documentation

## 2014-08-31 DIAGNOSIS — G8929 Other chronic pain: Secondary | ICD-10-CM | POA: Diagnosis not present

## 2014-08-31 DIAGNOSIS — M549 Dorsalgia, unspecified: Secondary | ICD-10-CM

## 2014-08-31 DIAGNOSIS — F419 Anxiety disorder, unspecified: Secondary | ICD-10-CM | POA: Insufficient documentation

## 2014-08-31 DIAGNOSIS — F32A Depression, unspecified: Secondary | ICD-10-CM

## 2014-08-31 DIAGNOSIS — F17200 Nicotine dependence, unspecified, uncomplicated: Secondary | ICD-10-CM | POA: Diagnosis not present

## 2014-08-31 DIAGNOSIS — M25561 Pain in right knee: Secondary | ICD-10-CM

## 2014-08-31 DIAGNOSIS — M545 Low back pain: Secondary | ICD-10-CM | POA: Diagnosis not present

## 2014-08-31 DIAGNOSIS — F323 Major depressive disorder, single episode, severe with psychotic features: Secondary | ICD-10-CM | POA: Diagnosis not present

## 2014-08-31 DIAGNOSIS — F329 Major depressive disorder, single episode, unspecified: Secondary | ICD-10-CM

## 2014-08-31 DIAGNOSIS — N3281 Overactive bladder: Secondary | ICD-10-CM

## 2014-08-31 MED ORDER — RISPERIDONE 0.5 MG PO TABS: 0.5000 mg | ORAL_TABLET | Freq: Every day | ORAL | Status: DC

## 2014-08-31 MED ORDER — CYCLOBENZAPRINE HCL 10 MG PO TABS: 10.0000 mg | ORAL_TABLET | Freq: Three times a day (TID) | ORAL | Status: DC | PRN

## 2014-08-31 MED ORDER — BUPROPION HCL ER (XL) 150 MG PO TB24: 300.0000 mg | ORAL_TABLET | Freq: Every day | ORAL | Status: DC

## 2014-08-31 MED ORDER — INDOMETHACIN 50 MG PO CAPS: 50.0000 mg | ORAL_CAPSULE | Freq: Three times a day (TID) | ORAL | Status: DC | PRN

## 2014-08-31 MED ORDER — SERTRALINE HCL 100 MG PO TABS: 100.0000 mg | ORAL_TABLET | Freq: Two times a day (BID) | ORAL | Status: DC

## 2014-08-31 MED ORDER — OXYBUTYNIN CHLORIDE ER 10 MG PO TB24: 10.0000 mg | ORAL_TABLET | Freq: Every day | ORAL | Status: DC

## 2014-08-31 NOTE — Progress Notes (Signed)
Establish Care Medicine Refill, all medicine listed

## 2014-08-31 NOTE — Progress Notes (Signed)
   Subjective:    Patient ID: Lauren Flynn, female    DOB: 05-06-1971, 43 y.o.   MRN: [id number] CC: establish care, depression  HPI 43 yo F presents to establish care and discuss the following:  1. 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 No SI or HI.  No substance abuse. No recent psychiatrist. Out of all medications x 2 weeks   2. Chronic low back pain: since 2000. No surgeries.   3. Chronic knee pain: unknown duration. Had previous scopes. Walks with a cane sometimes. Patient tripped and had a fall onto her L side   Soc hx: current smoker  Review of Systems As per HPI  GAD 7: score of 14- 1-5, 2-3 and 6. 4-1,2,4.     Objective:   Physical Exam BP 94/65  Pulse 72  Temp(Src) 98.7 F (37.1 C) (Oral)  Resp 18  Ht 5\' 9"  (1.753 m)  Wt 237 lb (107.502 kg)  BMI 34.98 kg/m2  SpO2 98%  LMP 06/28/2014 General appearance: alert, cooperative, no distress and mildly obese Lungs: clear to auscultation bilaterally Heart: regular rate and rhythm, S1, S2 normal, no murmur, click, rub or gallop Extremities: knee deformity, antalgic gait.      Assessment & Plan:

## 2014-08-31 NOTE — Assessment & Plan Note (Signed)
Refilled oxybutynin.

## 2014-08-31 NOTE — Assessment & Plan Note (Signed)
A: chronic stable. P: refill medications zoloft, wellbutrin, risperal Referral to psychiatry CBC with diff CMP

## 2014-08-31 NOTE — Patient Instructions (Addendum)
Ms. Lauren Flynn,  Thank you for coming in today. It was a pleasure meeting you. I look forward to being your primary doctor.   I have refilled your medications.  Placed a pain management referral. Flexeril for pain related to recent fall, short course.   Please schedule lab visit for blood work: CMP and CBC with diff.   Dr. Armen PickupFunches

## 2014-08-31 NOTE — Assessment & Plan Note (Signed)
A: chronic pain, recent fall P: Pain management referral Patient made aware that we do not prescribe narcotics here Refilled indocin Flexeril x short course, no refills CMP

## 2014-09-02 ENCOUNTER — Telehealth: Payer: Self-pay | Admitting: *Deleted

## 2014-09-02 NOTE — Telephone Encounter (Signed)
Pt stated need Rx Risperdal 3mg ,

## 2014-09-08 MED ORDER — RISPERIDONE 3 MG PO TABS: 3.0000 mg | ORAL_TABLET | Freq: Every day | ORAL | Status: DC

## 2014-09-08 NOTE — Addendum Note (Signed)
Addended by: Dessa PhiFUNCHES, Rylynn Schoneman on: 09/08/2014 01:02 PM   Modules accepted: Orders

## 2014-09-08 NOTE — Telephone Encounter (Signed)
Refilled respideral 3 mg nightly.

## 2014-09-12 ENCOUNTER — Emergency Department (HOSPITAL_COMMUNITY)
Admission: EM | Admit: 2014-09-12 | Discharge: 2014-09-13 | Disposition: A | Discharge status: Home / Self Care | Payer: Medicaid Other | Attending: Emergency Medicine | Admitting: Emergency Medicine

## 2014-09-12 ENCOUNTER — Encounter (HOSPITAL_COMMUNITY): Payer: Self-pay | Admitting: Emergency Medicine

## 2014-09-12 DIAGNOSIS — N858 Other specified noninflammatory disorders of uterus: Secondary | ICD-10-CM | POA: Insufficient documentation

## 2014-09-12 DIAGNOSIS — F329 Major depressive disorder, single episode, unspecified: Secondary | ICD-10-CM | POA: Insufficient documentation

## 2014-09-12 DIAGNOSIS — R102 Pelvic and perineal pain: Secondary | ICD-10-CM

## 2014-09-12 DIAGNOSIS — Z791 Long term (current) use of non-steroidal anti-inflammatories (NSAID): Secondary | ICD-10-CM | POA: Insufficient documentation

## 2014-09-12 DIAGNOSIS — E669 Obesity, unspecified: Secondary | ICD-10-CM | POA: Diagnosis not present

## 2014-09-12 DIAGNOSIS — R109 Unspecified abdominal pain: Secondary | ICD-10-CM | POA: Insufficient documentation

## 2014-09-12 DIAGNOSIS — Z792 Long term (current) use of antibiotics: Secondary | ICD-10-CM | POA: Diagnosis not present

## 2014-09-12 DIAGNOSIS — Z79899 Other long term (current) drug therapy: Secondary | ICD-10-CM | POA: Diagnosis not present

## 2014-09-12 DIAGNOSIS — Z720 Tobacco use: Secondary | ICD-10-CM | POA: Insufficient documentation

## 2014-09-12 DIAGNOSIS — Z7251 High risk heterosexual behavior: Secondary | ICD-10-CM

## 2014-09-12 DIAGNOSIS — Z3202 Encounter for pregnancy test, result negative: Secondary | ICD-10-CM | POA: Insufficient documentation

## 2014-09-12 DIAGNOSIS — G8929 Other chronic pain: Secondary | ICD-10-CM | POA: Insufficient documentation

## 2014-09-12 DIAGNOSIS — F419 Anxiety disorder, unspecified: Secondary | ICD-10-CM | POA: Diagnosis not present

## 2014-09-12 DIAGNOSIS — N898 Other specified noninflammatory disorders of vagina: Secondary | ICD-10-CM | POA: Diagnosis present

## 2014-09-12 LAB — POC URINE PREG, ED: Preg Test, Ur: NEGATIVE

## 2014-09-12 LAB — URINALYSIS, ROUTINE W REFLEX MICROSCOPIC
BILIRUBIN URINE: NEGATIVE
Glucose, UA: NEGATIVE mg/dL
Hgb urine dipstick: NEGATIVE
KETONES UR: NEGATIVE mg/dL
LEUKOCYTES UA: NEGATIVE
NITRITE: NEGATIVE
PH: 6 (ref 5.0–8.0)
PROTEIN: NEGATIVE mg/dL
Specific Gravity, Urine: 1.016 (ref 1.005–1.030)
UROBILINOGEN UA: 0.2 mg/dL (ref 0.0–1.0)

## 2014-09-12 NOTE — ED Notes (Signed)
C/o lower mid abd pain/pressure and vaginal d/c.also some back pain. Denies fever, bleeding or nv. LMP last week. 8/10.

## 2014-09-13 LAB — WET PREP, GENITAL
Clue Cells Wet Prep HPF POC: NONE SEEN
TRICH WET PREP: NONE SEEN
Yeast Wet Prep HPF POC: NONE SEEN

## 2014-09-13 MED ORDER — OXYCODONE-ACETAMINOPHEN 5-325 MG PO TABS: 1.0000 | ORAL_TABLET | Freq: Four times a day (QID) | ORAL | Status: DC | PRN

## 2014-09-13 MED ORDER — CEFTRIAXONE SODIUM 250 MG IJ SOLR
250.0000 mg | Freq: Once | INTRAMUSCULAR | Status: AC
  Administered 2014-09-13: 250 mg via INTRAMUSCULAR
  Filled 2014-09-13: qty 250

## 2014-09-13 MED ORDER — DOXYCYCLINE HYCLATE 100 MG PO CAPS: 100.0000 mg | ORAL_CAPSULE | Freq: Two times a day (BID) | ORAL | Status: DC

## 2014-09-13 MED ORDER — OXYCODONE-ACETAMINOPHEN 5-325 MG PO TABS
2.0000 | ORAL_TABLET | Freq: Once | ORAL | Status: AC
  Administered 2014-09-13: 2 via ORAL
  Filled 2014-09-13: qty 2

## 2014-09-13 NOTE — ED Provider Notes (Signed)
CSN: [id number]     Arrival date & time 09/12/14  2109 History   First MD Initiated Contact with Patient 09/12/14 2349     Chief Complaint  Patient presents with  . Abdominal Pain  . Vaginal Discharge    (Consider location/radiation/quality/duration/timing/severity/associated sxs/prior Treatment) HPI Comments: 43 year old female history of appendectomy and cholecystectomy as well as anxiety, depression, PID, and chronic back pain presents to the emergency department for suprapubic abdominal pain. Patient states that pain began this morning. She describes the pain as a pressure sensation which is nonradiating. She has not taken any medications for symptoms. Symptoms associated with white vaginal discharge. She denies fever, nausea, vomiting, vaginal bleeding, dysuria, and hematuria. Patient states that she has been sexually active with one partner in the last 6 months. She denies the use of barrier protection such as condoms. LMP 09/05/2014.  Patient is a 43 y.o. female presenting with abdominal pain and vaginal discharge. The history is provided by the patient. No language interpreter was used.  Abdominal Pain Associated symptoms: vaginal discharge   Associated symptoms: no dysuria, no fever, no hematuria, no nausea, no vaginal bleeding and no vomiting   Vaginal Discharge Associated symptoms: abdominal pain   Associated symptoms: no dysuria, no fever, no nausea and no vomiting     Past Medical History  Diagnosis Date  . Chronic back pain 2000    lower back pain, no surgeries   . Anxiety Dx 2005  . Depression Dx 2005   Past Surgical History  Procedure Laterality Date  . Appendectomy  1983   . Cholecystectomy  2003    Family History  Problem Relation Age of Onset  . Heart disease Father   . Asthma Sister   . Cancer Maternal Grandmother     colon  . Cancer Paternal Grandfather   . Cancer Maternal Grandfather     colon   History  Substance Use Topics  . Smoking status:  Current Every Day Smoker -- 0.50 packs/day for 27 years    Types: Cigarettes  . Smokeless tobacco: Never Used  . Alcohol Use: Yes     Comment: rare    OB History   Grav Para Term Preterm Abortions TAB SAB Ect Mult Living                  Review of Systems  Constitutional: Negative for fever.  Gastrointestinal: Positive for abdominal pain. Negative for nausea and vomiting.  Genitourinary: Positive for vaginal discharge. Negative for dysuria, hematuria and vaginal bleeding.  All other systems reviewed and are negative.   Allergies  Review of patient's allergies indicates no known allergies.  Home Medications   Prior to Admission medications   Medication Sig Start Date End Date Taking? Authorizing Provider  buPROPion (WELLBUTRIN XL) 150 MG 24 hr tablet Take 2 tablets (300 mg total) by mouth daily. 08/31/14  Yes Josalyn C Funches, MD  cyclobenzaprine (FLEXERIL) 10 MG tablet Take 1 tablet (10 mg total) by mouth 3 (three) times daily as needed for muscle spasms (related to recent fall). 08/31/14  Yes Josalyn C Funches, MD  indomethacin (INDOCIN) 50 MG capsule Take 1 capsule (50 mg total) by mouth 3 (three) times daily as needed. 08/31/14  Yes Josalyn C Funches, MD  oxybutynin (DITROPAN-XL) 10 MG 24 hr tablet Take 1 tablet (10 mg total) by mouth daily. 08/31/14  Yes Josalyn C Funches, MD  risperiDONE (RISPERDAL) 0.5 MG tablet Take 1 tablet (0.5 mg total) by mouth daily. 08/31/14  Yes  Lora PaulaJosalyn C Funches, MD  sertraline (ZOLOFT) 100 MG tablet Take 1 tablet (100 mg total) by mouth 2 (two) times daily. 08/31/14  Yes Josalyn C Funches, MD  doxycycline (VIBRAMYCIN) 100 MG capsule Take 1 capsule (100 mg total) by mouth 2 (two) times daily. 09/13/14   Antony MaduraKelly Tadan Shill, PA-C  oxyCODONE-acetaminophen (PERCOCET/ROXICET) 5-325 MG per tablet Take 1-2 tablets by mouth every 6 (six) hours as needed for moderate pain or severe pain. 09/13/14   Antony MaduraKelly Deondray Ospina, PA-C  risperiDONE (RISPERDAL) 3 MG tablet Take 1 tablet  (3 mg total) by mouth at bedtime. 09/08/14   Lora PaulaJosalyn C Funches, MD   BP 96/67  Pulse 71  Temp(Src) 98.3 F (36.8 C) (Oral)  Resp 18  Ht 5\' 8"  (1.727 m)  Wt 235 lb (106.595 kg)  BMI 35.74 kg/m2  SpO2 99%  LMP 09/05/2014  Physical Exam  Nursing note and vitals reviewed. Constitutional: She is oriented to person, place, and time. She appears well-developed and well-nourished. No distress.  Nontoxic/nonseptic appearing  HENT:  Head: Normocephalic and atraumatic.  Eyes: Conjunctivae and EOM are normal. No scleral icterus.  Neck: Normal range of motion.  Cardiovascular: Normal rate, regular rhythm and intact distal pulses.   Pulmonary/Chest: Effort normal and breath sounds normal. No respiratory distress. She has no wheezes. She has no rales.  Abdominal: Soft. She exhibits no distension. There is tenderness. There is no rebound and no guarding.  Suprapubic tenderness to palpation. No masses or peritoneal signs. Soft, obese abdomen.  Genitourinary: There is no rash, tenderness, lesion or injury on the right labia. There is no rash, tenderness, lesion or injury on the left labia. Uterus is tender. Cervix exhibits friability (Mild friability around cervical os). Cervix exhibits no motion tenderness. Right adnexum displays no mass, no tenderness and no fullness. Left adnexum displays tenderness (Very mild). Left adnexum displays no mass and no fullness. Vaginal discharge (Scant clear/white discharge) found.  No tenderness to palpation out of proportion to exam findings  Musculoskeletal: Normal range of motion.  Neurological: She is alert and oriented to person, place, and time. She exhibits normal muscle tone. Coordination normal.  GCS 15. Patient moving all extremities.  Skin: Skin is warm and dry. No rash noted. She is not diaphoretic. No erythema. No pallor.  Psychiatric: She has a normal mood and affect. Her behavior is normal.    ED Course  Procedures (including critical care  time) Labs Review Labs Reviewed  WET PREP, GENITAL - Abnormal; Notable for the following:    WBC, Wet Prep HPF POC FEW (*)    All other components within normal limits  URINALYSIS, ROUTINE W REFLEX MICROSCOPIC - Abnormal; Notable for the following:    APPearance HAZY (*)    All other components within normal limits  GC/CHLAMYDIA PROBE AMP  POC URINE PREG, ED   Imaging Review No results found.   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 Symptoms constant since waking this morning. No associated symptoms. Patient endorses a history of unprotected sexual intercourse within the last 6 months. Patient noted to have suprapubic tenderness on exam today. GU exam significant for left adnexal tenderness. This is mild. No tenderness out of proportion to exam. No cervical motion tenderness.  I have a low suspicion for ovarian torsion in this patient given his constant  nature of pain since morning without pain out of proportion on exam. No associated nausea or vomiting. Also low suspicion for tubo-ovarian abscess given lack of fever. Symptoms mild and only present for 24 hours. Urinalysis does not suggest infection. Wet prep shows few white blood cells. Urine pregnancy negative. Will cover for PID given history of unprotected sexual intercourse with Rocephin and doxycycline. Possible, also, that symptoms may be secondary to ovarian cyst or hemorrhagic cyst. However, still no indication for emergent ultrasound. Pain control to be managed with Percocet. Have advised OB/GYN follow-up with women's outpatient clinic should symptoms persist or worsen. Return precautions provided and patient agreeable to plan with no unaddressed concerns.   Filed Vitals:   09/12/14 2137 09/12/14 2352 09/13/14 0138 09/13/14 0200  BP: 113/67 96/67 93/60   99/58  Pulse: 63 71 75 58  Temp: 98.3 F (36.8 C)     TempSrc: Oral     Resp: 18  24   Height: 5\' 8"  (1.727 m)     Weight: 235 lb (106.595 kg)     SpO2: 100% 99% 95% 99%     Antony Madura, PA-C 09/13/14 2032

## 2014-09-13 NOTE — Discharge Instructions (Signed)
Abdominal Pain, Women °Abdominal (stomach, pelvic, or belly) pain can be caused by many things. It is important to tell your doctor: °· The location of the pain. °· Does it come and go or is it present all the time? °· Are there things that start the pain (eating certain foods, exercise)? °· Are there other symptoms associated with the pain (fever, nausea, vomiting, diarrhea)? °All of this is helpful to know when trying to find the cause of the pain. °CAUSES  °· Stomach: virus or bacteria infection, or ulcer. °· Intestine: appendicitis (inflamed appendix), regional ileitis (Crohn's disease), ulcerative colitis (inflamed colon), irritable bowel syndrome, diverticulitis (inflamed diverticulum of the colon), or cancer of the stomach or intestine. °· Gallbladder disease or stones in the gallbladder. °· Kidney disease, kidney stones, or infection. °· Pancreas infection or cancer. °· Fibromyalgia (pain disorder). °· Diseases of the female organs: °¨ Uterus: fibroid (non-cancerous) tumors or infection. °¨ Fallopian tubes: infection or tubal pregnancy. °¨ Ovary: cysts or tumors. °¨ Pelvic adhesions (scar tissue). °¨ Endometriosis (uterus lining tissue growing in the pelvis and on the pelvic organs). °¨ Pelvic congestion syndrome (female organs filling up with blood just before the menstrual period). °¨ Pain with the menstrual period. °¨ Pain with ovulation (producing an egg). °¨ Pain with an IUD (intrauterine device, birth control) in the uterus. °¨ Cancer of the female organs. °· Functional pain (pain not caused by a disease, may improve without treatment). °· Psychological pain. °· Depression. °DIAGNOSIS  °Your doctor will decide the seriousness of your pain by doing an examination. °· Blood tests. °· X-rays. °· Ultrasound. °· CT scan (computed tomography, special type of X-ray). °· MRI (magnetic resonance imaging). °· Cultures, for infection. °· Barium enema (dye inserted in the large intestine, to better view it with  X-rays). °· Colonoscopy (looking in intestine with a lighted tube). °· Laparoscopy (minor surgery, looking in abdomen with a lighted tube). °· Major abdominal exploratory surgery (looking in abdomen with a large incision). °TREATMENT  °The treatment will depend on the cause of the pain.  °· Many cases can be observed and treated at home. °· Over-the-counter medicines recommended by your caregiver. °· Prescription medicine. °· Antibiotics, for infection. °· Birth control pills, for painful periods or for ovulation pain. °· Hormone treatment, for endometriosis. °· Nerve blocking injections. °· Physical therapy. °· Antidepressants. °· Counseling with a psychologist or psychiatrist. °· Minor or major surgery. °HOME CARE INSTRUCTIONS  °· Do not take laxatives, unless directed by your caregiver. °· Take over-the-counter pain medicine only if ordered by your caregiver. Do not take aspirin because it can cause an upset stomach or bleeding. °· Try a clear liquid diet (broth or water) as ordered by your caregiver. Slowly move to a bland diet, as tolerated, if the pain is related to the stomach or intestine. °· Have a thermometer and take your temperature several times a day, and record it. °· Bed rest and sleep, if it helps the pain. °· Avoid sexual intercourse, if it causes pain. °· Avoid stressful situations. °· Keep your follow-up appointments and tests, as your caregiver orders. °· If the pain does not go away with medicine or surgery, you may try: °¨ Acupuncture. °¨ Relaxation exercises (yoga, meditation). °¨ Group therapy. °¨ Counseling. °SEEK MEDICAL CARE IF:  °· You notice certain foods cause stomach pain. °· Your home care treatment is not helping your pain. °· You need stronger pain medicine. °· You want your IUD removed. °· You feel faint or   lightheaded. °· You develop nausea and vomiting. °· You develop a rash. °· You are having side effects or an allergy to your medicine. °SEEK IMMEDIATE MEDICAL CARE IF:  °· Your  pain does not go away or gets worse. °· You have a fever. °· Your pain is felt only in portions of the abdomen. The right side could possibly be appendicitis. The left lower portion of the abdomen could be colitis or diverticulitis. °· You are passing blood in your stools (bright red or black tarry stools, with or without vomiting). °· You have blood in your urine. °· You develop chills, with or without a fever. °· You pass out. °MAKE SURE YOU:  °· Understand these instructions. °· Will watch your condition. °· Will get help right away if you are not doing well or get worse. °Document Released: 09/03/2007 Document Revised: 03/23/2014 Document Reviewed: 09/23/2009 °[employer]® Patient Information ©2015 [employer]. This information is not intended to replace advice given to you by your health care provider. Make sure you discuss any questions you have with your health care provider. ° °

## 2014-09-14 LAB — GC/CHLAMYDIA PROBE AMP
CT Probe RNA: NEGATIVE
GC PROBE AMP APTIMA: NEGATIVE

## 2014-09-14 NOTE — ED Provider Notes (Addendum)
Medical screening examination/treatment/procedure(s) were conducted as a shared visit with non-physician practitioner(s) or resident and myself. I personally evaluated the patient during the encounter and agree with the findings.  I have personally reviewed any xrays and/ or EKG's with the provider and I agree with interpretation.   Patient with PID, chronic back pain history presents with central lower abdominal pressure and white vaginal discharge since today. Patient denies fevers or vomiting. No history of abscess. Gradually worsening. Clinically likely pelvic inflammatory disease versus ovarian cyst. On exam patient has mild suprapubic and left pelvic tenderness. I do not feel emergent ultrasounds indicated this evening to discuss close follow-up with OB/GYN. Antibiotics given prophylactically.  Suprapubic abdominal pain, vaginal discharge        Enid SkeensJoshua M Nastassja Witkop, MD 09/20/14 1733

## 2014-09-15 ENCOUNTER — Telehealth: Payer: Self-pay | Admitting: *Deleted

## 2014-10-02 ENCOUNTER — Encounter (HOSPITAL_COMMUNITY): Payer: Self-pay | Admitting: *Deleted

## 2014-10-02 ENCOUNTER — Emergency Department (HOSPITAL_COMMUNITY): Payer: Medicaid Other

## 2014-10-02 ENCOUNTER — Emergency Department (HOSPITAL_COMMUNITY)
Admission: EM | Admit: 2014-10-02 | Discharge: 2014-10-03 | Disposition: A | Discharge status: Home / Self Care | Payer: Medicaid Other | Attending: Emergency Medicine | Admitting: Emergency Medicine

## 2014-10-02 DIAGNOSIS — Z792 Long term (current) use of antibiotics: Secondary | ICD-10-CM | POA: Diagnosis not present

## 2014-10-02 DIAGNOSIS — Z79899 Other long term (current) drug therapy: Secondary | ICD-10-CM | POA: Insufficient documentation

## 2014-10-02 DIAGNOSIS — Z9889 Other specified postprocedural states: Secondary | ICD-10-CM | POA: Insufficient documentation

## 2014-10-02 DIAGNOSIS — R072 Precordial pain: Secondary | ICD-10-CM | POA: Insufficient documentation

## 2014-10-02 DIAGNOSIS — G8929 Other chronic pain: Secondary | ICD-10-CM | POA: Insufficient documentation

## 2014-10-02 DIAGNOSIS — Z791 Long term (current) use of non-steroidal anti-inflammatories (NSAID): Secondary | ICD-10-CM | POA: Diagnosis not present

## 2014-10-02 DIAGNOSIS — F419 Anxiety disorder, unspecified: Secondary | ICD-10-CM | POA: Insufficient documentation

## 2014-10-02 DIAGNOSIS — R110 Nausea: Secondary | ICD-10-CM | POA: Diagnosis not present

## 2014-10-02 DIAGNOSIS — R0602 Shortness of breath: Secondary | ICD-10-CM | POA: Diagnosis not present

## 2014-10-02 DIAGNOSIS — R61 Generalized hyperhidrosis: Secondary | ICD-10-CM | POA: Insufficient documentation

## 2014-10-02 DIAGNOSIS — R079 Chest pain, unspecified: Secondary | ICD-10-CM | POA: Diagnosis present

## 2014-10-02 DIAGNOSIS — Z720 Tobacco use: Secondary | ICD-10-CM | POA: Insufficient documentation

## 2014-10-02 DIAGNOSIS — Z79891 Long term (current) use of opiate analgesic: Secondary | ICD-10-CM | POA: Insufficient documentation

## 2014-10-02 LAB — CBC
HEMATOCRIT: 33.9 % — AB (ref 36.0–46.0)
HEMOGLOBIN: 11.2 g/dL — AB (ref 12.0–15.0)
MCH: 29.5 pg (ref 26.0–34.0)
MCHC: 33 g/dL (ref 30.0–36.0)
MCV: 89.2 fL (ref 78.0–100.0)
Platelets: 285 10*3/uL (ref 150–400)
RBC: 3.8 MIL/uL — ABNORMAL LOW (ref 3.87–5.11)
RDW: 13.3 % (ref 11.5–15.5)
WBC: 8.7 10*3/uL (ref 4.0–10.5)

## 2014-10-02 LAB — COMPREHENSIVE METABOLIC PANEL
ALBUMIN: 3.4 g/dL — AB (ref 3.5–5.2)
ALK PHOS: 77 U/L (ref 39–117)
ALT: 10 U/L (ref 0–35)
ANION GAP: 13 (ref 5–15)
AST: 15 U/L (ref 0–37)
BUN: 13 mg/dL (ref 6–23)
CHLORIDE: 107 meq/L (ref 96–112)
CO2: 21 mEq/L (ref 19–32)
Calcium: 9.8 mg/dL (ref 8.4–10.5)
Creatinine, Ser: 1 mg/dL (ref 0.50–1.10)
GFR calc Af Amer: 79 mL/min — ABNORMAL LOW (ref >90)
GFR calc non Af Amer: 68 mL/min — ABNORMAL LOW (ref >90)
Glucose, Bld: 87 mg/dL (ref 70–99)
Potassium: 4.1 mEq/L (ref 3.7–5.3)
Sodium: 141 mEq/L (ref 137–147)
Total Protein: 6.5 g/dL (ref 6.0–8.3)

## 2014-10-02 LAB — TROPONIN I

## 2014-10-02 LAB — PRO B NATRIURETIC PEPTIDE: Pro B Natriuretic peptide (BNP): 215.5 pg/mL — ABNORMAL HIGH (ref 0–125)

## 2014-10-02 MED ORDER — SODIUM CHLORIDE 0.9 % IV BOLUS (SEPSIS)
1000.0000 mL | Freq: Once | INTRAVENOUS | Status: AC
  Administered 2014-10-02: 1000 mL via INTRAVENOUS

## 2014-10-02 MED ORDER — ASPIRIN 325 MG PO TABS
325.0000 mg | ORAL_TABLET | ORAL | Status: AC
  Administered 2014-10-02: 325 mg via ORAL
  Filled 2014-10-02: qty 1

## 2014-10-02 MED ORDER — NITROGLYCERIN 0.4 MG SL SUBL
0.4000 mg | SUBLINGUAL_TABLET | SUBLINGUAL | Status: DC | PRN
  Filled 2014-10-02: qty 1

## 2014-10-02 NOTE — ED Provider Notes (Signed)
CSN: [id number]     Arrival date & time 10/02/14  2152 History   First MD Initiated Contact with Patient 10/02/14 2255     Chief Complaint  Patient presents with  . Chest Pain     (Consider location/radiation/quality/duration/timing/severity/associated sxs/prior Treatment) HPI Comments: Patient is a 43 yo F PMHx significant for tobacco abuse, h/o MI (1998) presenting to the ED for substernal chest pain with radiation to left arm with associated nausea. Patient states she had had intermittent sharp chest pains on and off for four days until 8PM when it became more severe and continues to be intermittent. She did not try any medications as home. Patient states she had a cardiac catheterization within the last year, along with a stress test both of which were normal (12/14). She states that this [place]. Denies any cocaine use. PERC negative.  Patient is a 43 y.o. female presenting with chest pain.  Chest Pain Associated symptoms: diaphoresis, nausea and shortness of breath   Associated symptoms: no fever and not vomiting     Past Medical History  Diagnosis Date  . Chronic back pain 2000    lower back pain, no surgeries   . Anxiety Dx 2005  . Depression Dx 2005   Past Surgical History  Procedure Laterality Date  . Appendectomy  1983   . Cholecystectomy  2003    Family History  Problem Relation Age of Onset  . Heart disease Father   . Asthma Sister   . Cancer Maternal Grandmother     colon  . Cancer Paternal Grandfather   . Cancer Maternal Grandfather     colon   History  Substance Use Topics  . Smoking status: Current Every Day Smoker -- 0.50 packs/day for 27 years    Types: Cigarettes  . Smokeless tobacco: Never Used  . Alcohol Use: Yes     Comment: rare    OB History    No data available     Review of Systems  Constitutional: Positive for diaphoresis. Negative for fever.  Respiratory: Positive for shortness of breath.   Cardiovascular: Positive for  chest pain.  Gastrointestinal: Positive for nausea. Negative for vomiting.  All other systems reviewed and are negative.     Allergies  Review of patient's allergies indicates no known allergies.  Home Medications   Prior to Admission medications   Medication Sig Start Date End Date Taking? Authorizing Provider  buPROPion (WELLBUTRIN XL) 300 MG 24 hr tablet Take 300 mg by mouth daily.   Yes Historical Provider, MD  indomethacin (INDOCIN) 50 MG capsule Take 1 capsule (50 mg total) by mouth 3 (three) times daily as needed. Patient taking differently: Take 50 mg by mouth 2 (two) times daily with a meal.  08/31/14  Yes Josalyn C Funches, MD  oxybutynin (DITROPAN-XL) 10 MG 24 hr tablet Take 1 tablet (10 mg total) by mouth daily. 08/31/14  Yes Josalyn C Funches, MD  risperiDONE (RISPERDAL) 0.5 MG tablet Take 1 tablet (0.5 mg total) by mouth daily. 08/31/14  Yes Josalyn C Funches, MD  risperiDONE (RISPERDAL) 3 MG tablet Take 1 tablet (3 mg total) by mouth at bedtime. 09/08/14  Yes Josalyn C Funches, MD  sertraline (ZOLOFT) 100 MG tablet Take 1 tablet (100 mg total) by mouth 2 (two) times daily. Patient taking differently: Take 200 mg by mouth daily.  08/31/14  Yes Josalyn C Funches, MD  buPROPion (WELLBUTRIN XL) 150 MG 24 hr tablet Take 2 tablets (300 mg total) by  mouth daily. 08/31/14   Josalyn C Funches, MD  cyclobenzaprine (FLEXERIL) 10 MG tablet Take 1 tablet (10 mg total) by mouth 3 (three) times daily as needed for muscle spasms (related to recent fall). 08/31/14   Lora PaulaJosalyn C Funches, MD  doxycycline (VIBRAMYCIN) 100 MG capsule Take 1 capsule (100 mg total) by mouth 2 (two) times daily. 09/13/14   Antony MaduraKelly Humes, PA-C  oxyCODONE-acetaminophen (PERCOCET/ROXICET) 5-325 MG per tablet Take 1-2 tablets by mouth every 6 (six) hours as needed for moderate pain or severe pain. 09/13/14   Antony MaduraKelly Humes, PA-C  traMADol (ULTRAM) 50 MG tablet Take 1 tablet (50 mg total) by mouth every 6 (six) hours as needed.  10/03/14   Donney Caraveo L Jestina Stephani, PA-C   BP 100/58 mmHg  Pulse 53  Temp(Src) 97.7 F (36.5 C) (Oral)  Resp 10  SpO2 100%  LMP 09/27/2014 (Approximate) Physical Exam  Constitutional: She is oriented to person, place, and time. She appears well-developed and well-nourished.  HENT:  Head: Normocephalic and atraumatic.  Right Ear: External ear normal.  Left Ear: External ear normal.  Nose: Nose normal.  Eyes: Conjunctivae are normal.  Neck: Neck supple.  Cardiovascular: Normal rate, regular rhythm and normal heart sounds.   Pulmonary/Chest: Effort normal and breath sounds normal.  Abdominal: Soft. There is no tenderness.  Musculoskeletal: She exhibits no edema.  Neurological: She is alert and oriented to person, place, and time.  Skin: Skin is warm. She is diaphoretic.    ED Course  Procedures (including critical care time) Medications  nitroGLYCERIN (NITROSTAT) SL tablet 0.4 mg (not administered)  aspirin tablet 325 mg (325 mg Oral Given 10/02/14 2203)  sodium chloride 0.9 % bolus 1,000 mL (0 mLs Intravenous Stopped 10/03/14 0212)  morphine 4 MG/ML injection 4 mg (4 mg Intravenous Given 10/03/14 0032)  oxyCODONE-acetaminophen (PERCOCET/ROXICET) 5-325 MG per tablet 2 tablet (2 tablets Oral Given 10/03/14 0418)    Labs Review Labs Reviewed  CBC - Abnormal; Notable for the following:    RBC 3.80 (*)    Hemoglobin 11.2 (*)    HCT 33.9 (*)    All other components within normal limits  PRO B NATRIURETIC PEPTIDE - Abnormal; Notable for the following:    Pro B Natriuretic peptide (BNP) 215.5 (*)    All other components within normal limits  COMPREHENSIVE METABOLIC PANEL - Abnormal; Notable for the following:    Albumin 3.4 (*)    Total Bilirubin <0.2 (*)    GFR calc non Af Amer 68 (*)    GFR calc Af Amer 79 (*)    All other components within normal limits  TROPONIN I  PREGNANCY, URINE  TROPONIN I  URINE RAPID DRUG SCREEN (HOSP PERFORMED)    Imaging Review Dg Chest  Port 1 View  10/03/2014   CLINICAL DATA:  Sharp sided LEFT chest pain beginning today. History of myocardial infarction.  EXAM: PORTABLE CHEST - 1 VIEW  COMPARISON:  None.  FINDINGS: The heart size and mediastinal contours are within normal limits. Both lungs are clear. The visualized skeletal structures are unremarkable.  IMPRESSION: No active disease.   Electronically Signed   By: Awilda Metroourtnay  Bloomer   On: 10/03/2014 00:04     EKG Interpretation   Date/Time:  Friday October 02 2014 22:02:50 EST Ventricular Rate:  60 PR Interval:  146 QRS Duration: 86 QT Interval:  404 QTC Calculation: 404 R Axis:   79 Text Interpretation:  Normal sinus rhythm with sinus arrhythmia Normal ECG  T wave inversion  lead v2 Confirmed by Erroll Luna [phone]) on  10/03/2014 1:52:58 AM      [place] records were reviewed. Patient with echocardiogram performed December 2014 with minimal  Decreased ejection fraction. CT chest angiogram negative for PE other or other acute cardio pulmonary findings.   MDM   Final diagnoses:  Chest pain    Filed Vitals:   10/03/14 0430  BP: 100/58  Pulse: 53  Temp:   Resp: 10   Afebrile, NAD, non-toxic appearing, AAOx4.  Patient is to be discharged with recommendation to follow up with PCP in regards to today's hospital visit. Chest pain is not likely of cardiac or pulmonary etiology d/t presentation, perc negative, VSS, no tracheal deviation, no JVD or new murmur, RRR, breath sounds equal bilaterally, EKG without acute abnormalities, negative delta troponin, and negative CXR. Pt has been advised to follow up with cardiology and return to the ED is CP becomes exertional, associated with diaphoresis or nausea, radiates to left jaw/arm, worsens or becomes concerning in any way. Pt appears reliable for follow up and is agreeable to discharge.   Case has been discussed with and seen by Dr. Mora Bellman who agrees with the above plan to discharge.      Jeannetta Ellis, PA-C 10/03/14 6045  Tomasita Crumble, MD 10/03/14 1419

## 2014-10-02 NOTE — ED Notes (Signed)
Pt c/o left side sharp chest pain radiating to left arm. Pt was sitting down when onset of pain started. Pt reports hx of MI. Pt reports shortness of breath. Pt denies n/v, diaphoresis. Pt has not taken aspirin.

## 2014-10-02 NOTE — ED Notes (Signed)
Patient not given any Nitro SL at this time due to systolic BP in low 100's.  Patient given bolus of NS and will reassess after bolus.  Continuing monitoring patient.

## 2014-10-02 NOTE — ED Notes (Signed)
Pt is aware that urine in needed for testing.

## 2014-10-03 LAB — TROPONIN I: Troponin I: <0.3 ng/mL (ref <0.30)

## 2014-10-03 LAB — PREGNANCY, URINE: Preg Test, Ur: NEGATIVE

## 2014-10-03 MED ORDER — MORPHINE SULFATE 4 MG/ML IJ SOLN
4.0000 mg | Freq: Once | INTRAMUSCULAR | Status: AC
Start: 2014-10-03 — End: 2014-10-03
  Administered 2014-10-03: 4 mg via INTRAVENOUS
  Filled 2014-10-03: qty 1

## 2014-10-03 MED ORDER — OXYCODONE-ACETAMINOPHEN 5-325 MG PO TABS
2.0000 | ORAL_TABLET | Freq: Once | ORAL | Status: AC
  Administered 2014-10-03: 2 via ORAL
  Filled 2014-10-03: qty 2

## 2014-10-03 MED ORDER — TRAMADOL HCL 50 MG PO TABS: 50.0000 mg | ORAL_TABLET | Freq: Four times a day (QID) | ORAL | Status: DC | PRN

## 2014-10-03 NOTE — Discharge Instructions (Signed)
Please follow up with your primary care physician in 1-2 days. If you do not have one please call the [facility] number listed above. Please follow up with the [facility] to schedule a follow up appointment.  Please take pain medication and/or muscle relaxants as prescribed and as needed for pain. Please do not drive on narcotic pain medication or on muscle relaxants. Please read all discharge instructions and return precautions.    Chest Pain (Nonspecific) It is often hard to give a specific diagnosis for the cause of chest pain. There is always a chance that your pain could be related to something serious, such as a heart attack or a blood clot in the lungs. You need to follow up with your health care provider for further evaluation. CAUSES   Heartburn.  Pneumonia or bronchitis.  Anxiety or stress.  Inflammation around your heart (pericarditis) or lung (pleuritis or pleurisy).  A blood clot in the lung.  A collapsed lung (pneumothorax). It can develop suddenly on its own (spontaneous pneumothorax) or from trauma to the chest.  Shingles infection (herpes zoster virus). The chest wall is composed of bones, muscles, and cartilage. Any of these can be the source of the pain.  The bones can be bruised by injury.  The muscles or cartilage can be strained by coughing or overwork.  The cartilage can be affected by inflammation and become sore (costochondritis). DIAGNOSIS  Lab tests or other studies may be needed to find the cause of your pain. Your health care provider may have you take a test called an ambulatory electrocardiogram (ECG). An ECG records your heartbeat patterns over a 24-hour period. You may also have other tests, such as:  Transthoracic echocardiogram (TTE). During echocardiography, sound waves are used to evaluate how blood flows through your heart.  Transesophageal echocardiogram (TEE).  Cardiac monitoring. This allows your health  care provider to monitor your heart rate and rhythm in real time.  Holter monitor. This is a portable device that records your heartbeat and can help diagnose heart arrhythmias. It allows your health care provider to track your heart activity for several days, if needed.  Stress tests by exercise or by giving medicine that makes the heart beat faster. TREATMENT   Treatment depends on what may be causing your chest pain. Treatment may include:  Acid blockers for heartburn.  Anti-inflammatory medicine.  Pain medicine for inflammatory conditions.  Antibiotics if an infection is present.  You may be advised to change lifestyle habits. This includes stopping smoking and avoiding alcohol, caffeine, and chocolate.  You may be advised to keep your head raised (elevated) when sleeping. This reduces the chance of acid going backward from your stomach into your esophagus. Most of the time, nonspecific chest pain will improve within 2-3 days with rest and mild pain medicine.  HOME CARE INSTRUCTIONS   If antibiotics were prescribed, take them as directed. Finish them even if you start to feel better.  For the next few days, avoid physical activities that bring on chest pain. Continue physical activities as directed.  Do not use any tobacco products, including cigarettes, chewing tobacco, or electronic cigarettes.  Avoid drinking alcohol.  Only take medicine as directed by your health care provider.  Follow your health care provider's suggestions for further testing if your chest pain does not go away.  Keep any follow-up appointments you made. If you do not go to an appointment, you could develop lasting (chronic) problems with pain.  If there is any problem keeping an appointment, call to reschedule. SEEK MEDICAL CARE IF:   Your chest pain does not go away, even after treatment.  You have a rash with blisters on your chest.  You have a fever. SEEK IMMEDIATE MEDICAL CARE IF:   You have  increased chest pain or pain that spreads to your arm, neck, jaw, back, or abdomen.  You have shortness of breath.  You have an increasing cough, or you cough up blood.  You have severe back or abdominal pain.  You feel nauseous or vomit.  You have severe weakness.  You faint.  You have chills. This is an emergency. Do not wait to see if the pain will go away. Get medical help at once. Call your local emergency services (911 in [place] Do not drive yourself to the hospital. MAKE SURE YOU:   Understand these instructions.  Will watch your condition.  Will get help right away if you are not doing well or get worse. Document Released: 08/16/2005 Document Revised: 11/11/2013 Document Reviewed: 06/11/2008 [facility] Patient Information 2015 [place], [place]. This information is not intended to replace advice given to you by your health care provider. Make sure you discuss any questions you have with your health care provider.

## 2014-10-30 ENCOUNTER — Encounter (HOSPITAL_COMMUNITY): Payer: Self-pay | Admitting: Emergency Medicine

## 2014-10-30 ENCOUNTER — Emergency Department (HOSPITAL_COMMUNITY)
Admission: EM | Admit: 2014-10-30 | Discharge: 2014-10-31 | Disposition: A | Discharge status: Home / Self Care | Payer: Medicaid Other | Attending: Emergency Medicine | Admitting: Emergency Medicine

## 2014-10-30 DIAGNOSIS — K088 Other specified disorders of teeth and supporting structures: Secondary | ICD-10-CM | POA: Insufficient documentation

## 2014-10-30 DIAGNOSIS — Z79899 Other long term (current) drug therapy: Secondary | ICD-10-CM | POA: Insufficient documentation

## 2014-10-30 DIAGNOSIS — M5442 Lumbago with sciatica, left side: Secondary | ICD-10-CM | POA: Insufficient documentation

## 2014-10-30 DIAGNOSIS — M79602 Pain in left arm: Secondary | ICD-10-CM | POA: Insufficient documentation

## 2014-10-30 DIAGNOSIS — M25552 Pain in left hip: Secondary | ICD-10-CM | POA: Insufficient documentation

## 2014-10-30 DIAGNOSIS — Z720 Tobacco use: Secondary | ICD-10-CM | POA: Diagnosis not present

## 2014-10-30 DIAGNOSIS — F419 Anxiety disorder, unspecified: Secondary | ICD-10-CM | POA: Insufficient documentation

## 2014-10-30 DIAGNOSIS — M545 Low back pain: Secondary | ICD-10-CM | POA: Diagnosis present

## 2014-10-30 DIAGNOSIS — G8929 Other chronic pain: Secondary | ICD-10-CM | POA: Insufficient documentation

## 2014-10-30 DIAGNOSIS — K0889 Other specified disorders of teeth and supporting structures: Secondary | ICD-10-CM

## 2014-10-30 DIAGNOSIS — M5432 Sciatica, left side: Secondary | ICD-10-CM

## 2014-10-30 DIAGNOSIS — F329 Major depressive disorder, single episode, unspecified: Secondary | ICD-10-CM | POA: Diagnosis not present

## 2014-10-30 DIAGNOSIS — M549 Dorsalgia, unspecified: Secondary | ICD-10-CM

## 2014-10-30 DIAGNOSIS — Z792 Long term (current) use of antibiotics: Secondary | ICD-10-CM | POA: Diagnosis not present

## 2014-10-30 NOTE — ED Notes (Signed)
Pt. reports chronic pain at left lower back radiating to left hip , denies  recent fall or injury , ambulatory , pt. stated pain worsened  these past several days unrelieved by prescription Percocet.

## 2014-10-30 NOTE — ED Provider Notes (Signed)
CSN: [id number]     Arrival date & time 10/30/14  2050 History  This chart was scribed for non-physician practitioner, Marlon Peliffany Harryette Shuart, PA-C,working with Loren Raceravid Yelverton, MD, by Karle PlumberJennifer Tensley, ED Scribe. This patient was seen in room TR05C/TR05C and the patient's care was started at 11:34 PM.  Chief Complaint  Patient presents with  . Back Pain   Patient is a 43 y.o. female presenting with back pain. The history is provided by the patient. No language interpreter was used.  Back Pain Associated symptoms: no fever, no numbness and no weakness     HPI Comments:  [name] is a 43 y.o. female with PMH of chronic back pain who presents to the Emergency Department complaining of severe low back pain and left hip pain that started two months ago. She also reports left arm pain. Pt states she fell on October 6th (2 months ago) on her left side on a concrete floor. Pt was seen for the fall the day after and had negative xrays. Once the pain began, she was treated with Percocet 5 mg that did not relieve her pain. She describes the back and hip pain as sharp and worsening. She reports the left arm pain as burning. Pt states she takes Oxycodone 15 mg four times daily for her chronic back pain and states it has not been helping. She denies any alleviating factors. Denies numbness, tingling or weakness of the lower extremities, fever, chills, nausea or vomiting, bowel or bladder incontinence. PMH of depression and anxiety. Denies allergies to any medications. She also reports right lower dental pain that she thinks is infected and requests antibiotics.   Past Medical History  Diagnosis Date  . Chronic back pain 2000    lower back pain, no surgeries   . Anxiety Dx 2005  . Depression Dx 2005   Past Surgical History  Procedure Laterality Date  . Appendectomy  1983   . Cholecystectomy  2003    Family History  Problem Relation Age of Onset  . Heart disease Father   . Asthma Sister   . Cancer  Maternal Grandmother     colon  . Cancer Paternal Grandfather   . Cancer Maternal Grandfather     colon   History  Substance Use Topics  . Smoking status: Current Every Day Smoker -- 0.50 packs/day for 27 years    Types: Cigarettes  . Smokeless tobacco: Never Used  . Alcohol Use: Yes     Comment: rare    OB History    No data available     Review of Systems  Constitutional: Negative for fever and chills.  Gastrointestinal: Negative for nausea and vomiting.  Musculoskeletal: Positive for back pain.  Skin: Negative for color change.  Neurological: Negative for weakness and numbness.  All other systems reviewed and are negative.   Allergies  Review of patient's allergies indicates no known allergies.  Home Medications   Prior to Admission medications   Medication Sig Start Date End Date Taking? Authorizing Provider  amoxicillin (AMOXIL) 500 MG capsule Take 1 capsule (500 mg total) by mouth 3 (three) times daily. 10/31/14   Ed Mandich Irine SealG Kimmie Doren, PA-C  buPROPion (WELLBUTRIN XL) 150 MG 24 hr tablet Take 2 tablets (300 mg total) by mouth daily. 08/31/14   Josalyn C Funches, MD  buPROPion (WELLBUTRIN XL) 300 MG 24 hr tablet Take 300 mg by mouth daily.    Historical Provider, MD  cyclobenzaprine (FLEXERIL) 10 MG tablet Take 1 tablet (10 mg total)  by mouth 3 (three) times daily as needed for muscle spasms (related to recent fall). 08/31/14   Lora PaulaJosalyn C Funches, MD  doxycycline (VIBRAMYCIN) 100 MG capsule Take 1 capsule (100 mg total) by mouth 2 (two) times daily. 09/13/14   Antony MaduraKelly Humes, PA-C  indomethacin (INDOCIN) 50 MG capsule Take 1 capsule (50 mg total) by mouth 3 (three) times daily as needed. Patient taking differently: Take 50 mg by mouth 2 (two) times daily with a meal.  08/31/14   Josalyn C Funches, MD  meloxicam (MOBIC) 15 MG tablet Take 1 tablet (15 mg total) by mouth daily. 10/31/14   Katira Dumais Irine SealG Chalon Zobrist, PA-C  oxybutynin (DITROPAN-XL) 10 MG 24 hr tablet Take 1 tablet (10 mg  total) by mouth daily. 08/31/14   Lora PaulaJosalyn C Funches, MD  oxyCODONE-acetaminophen (PERCOCET/ROXICET) 5-325 MG per tablet Take 1-2 tablets by mouth every 6 (six) hours as needed for moderate pain or severe pain. 09/13/14   Antony MaduraKelly Humes, PA-C  risperiDONE (RISPERDAL) 0.5 MG tablet Take 1 tablet (0.5 mg total) by mouth daily. 08/31/14   Josalyn C Funches, MD  risperiDONE (RISPERDAL) 3 MG tablet Take 1 tablet (3 mg total) by mouth at bedtime. 09/08/14   Lora PaulaJosalyn C Funches, MD  sertraline (ZOLOFT) 100 MG tablet Take 1 tablet (100 mg total) by mouth 2 (two) times daily. Patient taking differently: Take 200 mg by mouth daily.  08/31/14   Josalyn C Funches, MD  traMADol (ULTRAM) 50 MG tablet Take 1 tablet (50 mg total) by mouth every 6 (six) hours as needed. 10/03/14   Lise AuerJennifer L Piepenbrink, PA-C   Triage Vitals: BP 113/82 mmHg  Pulse 67  Temp(Src) 97.8 F (36.6 C) (Oral)  Resp 18  SpO2 99%  LMP 10/15/2014 Physical Exam  Constitutional: She is oriented to person, place, and time. She appears well-developed and well-nourished. No distress.  HENT:  Head: Normocephalic and atraumatic.  Mouth/Throat: Uvula is midline and oropharynx is clear and moist.    No trismus, tongue sweep reveals no abnormality  Eyes: EOM are normal. Pupils are equal, round, and reactive to light.  Neck: Normal range of motion. Neck supple.  Cardiovascular: Normal rate and regular rhythm.   Pulmonary/Chest: Effort normal.  Abdominal: Soft.  Musculoskeletal: Normal range of motion.       Back:  Pt has equal strength to bilateral lower extremities.  Neurosensory function adequate to both legs No clonus on dorsiflextion Skin color is normal. Skin is warm and moist.  I see no step off deformity, no midline bony tenderness.  Pt is able to ambulate- pain with certain movements makes her gait slowed.  No crepitus, laceration, effusion, induration, lesions, swelling.   Pedal pulses are symmetrical and palpable bilaterally   Mild/moderate tenderness to palpation of the left hip.  Neurological: She is alert and oriented to person, place, and time.  Skin: Skin is warm and dry.  Psychiatric: She has a normal mood and affect. Her behavior is normal.  Nursing note and vitals reviewed.   ED Course  Procedures (including critical care time) DIAGNOSTIC STUDIES: Oxygen Saturation is 99% on RA, normal by my interpretation.   COORDINATION OF CARE: 11:40 PM- Will give injection of pain medication and steroids to try to better control chronic pain. Advised pt to follow up with PCP. Pt verbalizes understanding and agrees to plan. NO prescriptions given at this visit  Medications  ondansetron (ZOFRAN-ODT) disintegrating tablet 4 mg (4 mg Oral Given 10/31/14 0050)  predniSONE (DELTASONE) tablet 60 mg (60  mg Oral Given 10/31/14 0050)  HYDROmorphone (DILAUDID) injection 1 mg (1 mg Intramuscular Given 10/31/14 0050)    Labs Review Labs Reviewed - No data to display  Imaging Review No results found.   EKG Interpretation None      MDM   Final diagnoses:  Sciatica, left  Chronic back pain  Pain, dental    43 y.o.Lauren Flynn  with back pain. No neurological deficits and normal neuro exam. Patient can walk. No loss of bowel or bladder control. No concern for cauda equina at this time base on HPI and physical exam findings. No fever, night sweats, weight loss, h/o cancer, IVDU.   RICE protocol and pain medicine indicated and discussed with patient.   Patient Plan 1. Medications:take at home pain medication and muscle relaxer. Cont usual home medications unless otherwise directed. 2. Treatment: rest, drink plenty of fluids, gentle stretching as discussed, alternate ice and heat  3. Follow Up: Please followup with your primary doctor for discussion of your diagnoses and further evaluation after today's visit; if you do not have a primary care doctor use the resource guide provided to find one  Advised to  follow-up with the orthopedist if symptoms do not start to resolve in the next 2-3 days. If develop loss of bowel or urinary control return to the ED as soon as possible for further evaluation. To take the medications as prescribed as they can cause harm if not taken appropriately.   Vital signs are stable at discharge. Filed Vitals:   10/31/14 0128  BP: 101/57  Pulse: 62  Temp: 98.2 F (36.8 C)  Resp: 18    Patient/guardian has voiced understanding and agreed to follow-up with the PCP or specialist.    I personally performed the services described in this documentation, which was scribed in my presence. The recorded information has been reviewed and is accurate.    Dorthula Matas, PA-C 11/02/14 1610  Loren Racer, MD 11/05/14 [phone]

## 2014-10-31 MED ORDER — HYDROMORPHONE HCL 1 MG/ML IJ SOLN: 1.0000 mg | Freq: Once | INTRAMUSCULAR | Status: DC

## 2014-10-31 MED ORDER — HYDROMORPHONE HCL 1 MG/ML IJ SOLN
1.0000 mg | Freq: Once | INTRAMUSCULAR | Status: AC
  Administered 2014-10-31: 1 mg via INTRAMUSCULAR
  Filled 2014-10-31: qty 1

## 2014-10-31 MED ORDER — PREDNISONE 20 MG PO TABS
60.0000 mg | ORAL_TABLET | Freq: Once | ORAL | Status: AC
  Administered 2014-10-31: 60 mg via ORAL
  Filled 2014-10-31: qty 3

## 2014-10-31 MED ORDER — MELOXICAM 15 MG PO TABS: 15.0000 mg | ORAL_TABLET | Freq: Every day | ORAL | Status: DC

## 2014-10-31 MED ORDER — AMOXICILLIN 500 MG PO CAPS: 500.0000 mg | ORAL_CAPSULE | Freq: Three times a day (TID) | ORAL | Status: DC

## 2014-10-31 MED ORDER — ONDANSETRON 4 MG PO TBDP
4.0000 mg | ORAL_TABLET | Freq: Once | ORAL | Status: AC
  Administered 2014-10-31: 4 mg via ORAL
  Filled 2014-10-31: qty 1

## 2014-10-31 NOTE — Discharge Instructions (Signed)
Sciatica Sciatica is pain, weakness, numbness, or tingling along the path of the sciatic nerve. The nerve starts in the lower back and runs down the back of each leg. The nerve controls the muscles in the lower leg and in the back of the knee, while also providing sensation to the back of the thigh, lower leg, and the sole of your foot. Sciatica is a symptom of another medical condition. For instance, nerve damage or certain conditions, such as a herniated disk or bone spur on the spine, pinch or put pressure on the sciatic nerve. This causes the pain, weakness, or other sensations normally associated with sciatica. Generally, sciatica only affects one side of the body. CAUSES   Herniated or slipped disc.  Degenerative disk disease.  A pain disorder involving the narrow muscle in the buttocks (piriformis syndrome).  Pelvic injury or fracture.  Pregnancy.  Tumor (rare). SYMPTOMS  Symptoms can vary from mild to very severe. The symptoms usually travel from the low back to the buttocks and down the back of the leg. Symptoms can include:  Mild tingling or dull aches in the lower back, leg, or hip.  Numbness in the back of the calf or sole of the foot.  Burning sensations in the lower back, leg, or hip.  Sharp pains in the lower back, leg, or hip.  Leg weakness.  Severe back pain inhibiting movement. These symptoms may get worse with coughing, sneezing, laughing, or prolonged sitting or standing. Also, being overweight may worsen symptoms. DIAGNOSIS  Your caregiver will perform a physical exam to look for common symptoms of sciatica. He or she may ask you to do certain movements or activities that would trigger sciatic nerve pain. Other tests may be performed to find the cause of the sciatica. These may include:  Blood tests.  X-rays.  Imaging tests, such as an MRI or CT scan. TREATMENT  Treatment is directed at the cause of the sciatic pain. Sometimes, treatment is not necessary  and the pain and discomfort goes away on its own. If treatment is needed, your caregiver may suggest:  Over-the-counter medicines to relieve pain.  Prescription medicines, such as anti-inflammatory medicine, muscle relaxants, or narcotics.  Applying heat or ice to the painful area.  Steroid injections to lessen pain, irritation, and inflammation around the nerve.  Reducing activity during periods of pain.  Exercising and stretching to strengthen your abdomen and improve flexibility of your spine. Your caregiver may suggest losing weight if the extra weight makes the back pain worse.  Physical therapy.  Surgery to eliminate what is pressing or pinching the nerve, such as a bone spur or part of a herniated disk. HOME CARE INSTRUCTIONS   Only take over-the-counter or prescription medicines for pain or discomfort as directed by your caregiver.  Apply ice to the affected area for 20 minutes, 3-4 times a day for the first 48-72 hours. Then try heat in the same way.  Exercise, stretch, or perform your usual activities if these do not aggravate your pain.  Attend physical therapy sessions as directed by your caregiver.  Keep all follow-up appointments as directed by your caregiver.  Do not wear high heels or shoes that do not provide proper support.  Check your mattress to see if it is too soft. A firm mattress may lessen your pain and discomfort. SEEK IMMEDIATE MEDICAL CARE IF:   You lose control of your bowel or bladder (incontinence).  You have increasing weakness in the lower back, pelvis, buttocks,   or legs.  You have redness or swelling of your back.  You have a burning sensation when you urinate.  You have pain that gets worse when you lie down or awakens you at night.  Your pain is worse than you have experienced in the past.  Your pain is lasting longer than 4 weeks.  You are suddenly losing weight without reason. MAKE SURE YOU:  Understand these  instructions.  Will watch your condition.  Will get help right away if you are not doing well or get worse. Document Released: 10/31/2001 Document Revised: 05/07/2012 Document Reviewed: 03/17/2012 [employer] Patient Information 2015 [employer]. This information is not intended to replace advice given to you by your health care provider. Make sure you discuss any questions you have with your health care provider.  

## 2014-11-27 ENCOUNTER — Telehealth: Payer: Self-pay | Admitting: Family Medicine

## 2014-11-27 ENCOUNTER — Other Ambulatory Visit: Payer: Medicaid - Out of State

## 2014-11-27 ENCOUNTER — Ambulatory Visit: Payer: Medicaid - Out of State | Admitting: Family Medicine

## 2014-11-27 NOTE — Telephone Encounter (Signed)
Pt was told by dr Lauren Flynn to make an appt for a referral and is trying to schedule for sometime this week. She is also requesting a medication refill for  traMADol (ULTRAM) 50 MG tablet and is interested in a refill for gabapentin. Please follow up with pt. And follow up with me or front staff if appt needs to be scheduled for this pt before Monday the 18th.

## 2014-11-30 ENCOUNTER — Telehealth: Payer: Self-pay | Admitting: *Deleted

## 2014-11-30 NOTE — Telephone Encounter (Signed)
Pt had appt on 11/27/13 but had to reschedule and now is scheduled for 12/07/13 but says she is out of medications and is requesting refill, please f/u with pt.

## 2014-11-30 NOTE — Telephone Encounter (Signed)
Pt called requesting Rx Tramadol and MRI referral Stated has back pain and can not wait till next visit Advised if pain continues go to urgent care for assistant

## 2014-12-01 ENCOUNTER — Other Ambulatory Visit: Payer: Self-pay | Admitting: Family Medicine

## 2014-12-01 MED ORDER — TRAMADOL HCL 50 MG PO TABS: 50.0000 mg | ORAL_TABLET | Freq: Three times a day (TID) | ORAL | Status: DC | PRN

## 2014-12-01 NOTE — Addendum Note (Signed)
Addended by: Dessa PhiFUNCHES, Phung Kotas on: 12/01/2014 10:34 AM   Modules accepted: Orders, Medications

## 2014-12-01 NOTE — Telephone Encounter (Signed)
Patient with known chronic low back pain. Tramadol refilled and placed up front for pick up.  Will wait for f.u appt to discuss whether an MRI is warranted.

## 2014-12-01 NOTE — Telephone Encounter (Signed)
Pt aware of Rx  Advised to make F/U appointment

## 2014-12-02 ENCOUNTER — Other Ambulatory Visit: Payer: Self-pay | Admitting: Family Medicine

## 2014-12-07 ENCOUNTER — Other Ambulatory Visit (HOSPITAL_COMMUNITY)
Admission: RE | Admit: 2014-12-07 | Discharge: 2014-12-07 | Disposition: A | Discharge status: Home / Self Care | Payer: Medicaid Other | Source: Ambulatory Visit | Attending: Family Medicine | Admitting: Family Medicine

## 2014-12-07 ENCOUNTER — Ambulatory Visit: Payer: Medicaid Other | Attending: Family Medicine | Admitting: Family Medicine

## 2014-12-07 ENCOUNTER — Encounter: Payer: Self-pay | Admitting: Family Medicine

## 2014-12-07 ENCOUNTER — Other Ambulatory Visit: Payer: Self-pay | Admitting: Family Medicine

## 2014-12-07 VITALS — BP 104/67 | HR 55 | Temp 98.0°F | Resp 16 | Ht 69.0 in | Wt 243.0 lb

## 2014-12-07 DIAGNOSIS — R202 Paresthesia of skin: Secondary | ICD-10-CM

## 2014-12-07 DIAGNOSIS — E669 Obesity, unspecified: Secondary | ICD-10-CM

## 2014-12-07 DIAGNOSIS — B3731 Acute candidiasis of vulva and vagina: Secondary | ICD-10-CM

## 2014-12-07 DIAGNOSIS — G8929 Other chronic pain: Secondary | ICD-10-CM

## 2014-12-07 DIAGNOSIS — Z124 Encounter for screening for malignant neoplasm of cervix: Secondary | ICD-10-CM

## 2014-12-07 DIAGNOSIS — R200 Anesthesia of skin: Secondary | ICD-10-CM | POA: Insufficient documentation

## 2014-12-07 DIAGNOSIS — Z01419 Encounter for gynecological examination (general) (routine) without abnormal findings: Secondary | ICD-10-CM | POA: Insufficient documentation

## 2014-12-07 DIAGNOSIS — Z1151 Encounter for screening for human papillomavirus (HPV): Secondary | ICD-10-CM | POA: Insufficient documentation

## 2014-12-07 DIAGNOSIS — M549 Dorsalgia, unspecified: Secondary | ICD-10-CM

## 2014-12-07 DIAGNOSIS — B373 Candidiasis of vulva and vagina: Secondary | ICD-10-CM

## 2014-12-07 DIAGNOSIS — Z113 Encounter for screening for infections with a predominantly sexual mode of transmission: Secondary | ICD-10-CM | POA: Diagnosis not present

## 2014-12-07 DIAGNOSIS — F329 Major depressive disorder, single episode, unspecified: Secondary | ICD-10-CM

## 2014-12-07 DIAGNOSIS — F32A Depression, unspecified: Secondary | ICD-10-CM

## 2014-12-07 DIAGNOSIS — N760 Acute vaginitis: Secondary | ICD-10-CM | POA: Insufficient documentation

## 2014-12-07 LAB — GLUCOSE, POCT (MANUAL RESULT ENTRY): POC GLUCOSE: 99 mg/dL (ref 70–99)

## 2014-12-07 LAB — POCT GLYCOSYLATED HEMOGLOBIN (HGB A1C): Hemoglobin A1C: 5.3

## 2014-12-07 MED ORDER — RISPERIDONE 0.5 MG PO TABS: 0.5000 mg | ORAL_TABLET | Freq: Every day | ORAL | Status: AC

## 2014-12-07 MED ORDER — GABAPENTIN 300 MG PO CAPS: 300.0000 mg | ORAL_CAPSULE | Freq: Three times a day (TID) | ORAL | Status: DC

## 2014-12-07 MED ORDER — RISPERIDONE 3 MG PO TABS: 3.0000 mg | ORAL_TABLET | Freq: Every day | ORAL | Status: AC

## 2014-12-07 NOTE — Assessment & Plan Note (Signed)
Normal CBG and A1c  Plan for f/u B12

## 2014-12-07 NOTE — Patient Instructions (Addendum)
Ms. Lauren Flynn,   Thank you for coming back in to see me today.  1. Chronic low back pain: We have to start with an x-ray, before I can order an MRI. X-ray ordered Also re-ordered gabapentin start with 300 mg nightly, then work up to 300 mg twice daily, by week 2, then 300 mg three times daily by week 3   2. Depression: Refilled risperidone for one month, you will have to follow up with psychiatry for additional refills.  I recommend [facility], first appt is walk in.   3. Screening pap: done today   You will be called with lab results.   F/u in 2 months for low back pain   Dr. Armen PickupFunches

## 2014-12-07 NOTE — Assessment & Plan Note (Signed)
2. Depression: Refilled risperidone for one month, you will have to follow up with psychiatry for additional refills.  I recommend [facility], first appt is walk in.

## 2014-12-07 NOTE — Assessment & Plan Note (Signed)
Screening for HIV.

## 2014-12-07 NOTE — Assessment & Plan Note (Signed)
1. Chronic low back pain: We have to start with an x-ray, before I can order an MRI. X-ray ordered Also re-ordered gabapentin start with 300 mg nightly, then work up to 300 mg twice daily, by week 2, then 300 mg three times daily by week 3

## 2014-12-07 NOTE — Progress Notes (Signed)
Patient would like referral to get MRI spine Would like prescription for Gabapentin Needs refills on respiradone 0.5 and 3 mg Would like pap today Patient has had a cough for month it is a dry cough no fevers Patient reports she smokes 3 cigarettes per day-not ready to quit LMP unknown-not sexually active currently [place]

## 2014-12-07 NOTE — Progress Notes (Signed)
   Subjective:    Patient ID: Lauren Flynn, female    DOB: 05/23/1971, 44 y.o.   MRN: [id number] CC: f/u chronic low back pain, refill request for Risperdal,  HPI 44 yo F f/u:  1. Chronic low back pain: since 2000 with flare up following fall in 08/2014. Pain radiating down L leg. No incontinence. Patient interested in restarting gabapentin. Taking indocin. Would like MRI of low back as well. Reports some tingling in both feet b/l.   2. Refill Risperdal: taking for her depression. She does not have a psychiatrist in town. She is amenable to establishing with psychiatry.   3. HM: due for pap. Last had sex in 05/2014. No vaginal discharge or pelvic pain. Periods are getting lighter and less frequent.   Soc Hx: current smoker 3 cigs per day  Review of Systems As per HPI     Objective:   Physical Exam BP 104/67 mmHg  Pulse 55  Temp(Src) 98 F (36.7 C)  Resp 16  Ht 5\' 9"  (1.753 m)  Wt 243 lb (110.224 kg)  BMI 35.87 kg/m2  SpO2 98%  LMP  (LMP Unknown) General appearance: alert, cooperative and no distress Lungs: normal WOB Pelvic: cervix normal in appearance, external genitalia normal, no adnexal masses or tenderness, no cervical motion tenderness, uterus normal size, shape, and consistency and vagina normal without discharge Back Exam: Back: Normal Curvature, no deformities or CVA tenderness  Paraspinal Tenderness: L5 on the L sided   LE Strength 5/5  LE Sensation: in tact  Skin: scattered skin colored papules on legs, with one ingrown hair with papule in L groin area        Assessment & Plan:

## 2014-12-08 ENCOUNTER — Telehealth: Payer: Self-pay | Admitting: *Deleted

## 2014-12-08 DIAGNOSIS — B373 Candidiasis of vulva and vagina: Secondary | ICD-10-CM | POA: Insufficient documentation

## 2014-12-08 DIAGNOSIS — B3731 Acute candidiasis of vulva and vagina: Secondary | ICD-10-CM | POA: Insufficient documentation

## 2014-12-08 LAB — CYTOLOGY - PAP

## 2014-12-08 LAB — HIV ANTIBODY (ROUTINE TESTING W REFLEX): HIV 1&2 Ab, 4th Generation: NONREACTIVE

## 2014-12-08 LAB — CERVICOVAGINAL ANCILLARY ONLY
Chlamydia: NEGATIVE
NEISSERIA GONORRHEA: NEGATIVE
WET PREP (BD AFFIRM): NEGATIVE
WET PREP (BD AFFIRM): POSITIVE — AB
Wet Prep (BD Affirm): NEGATIVE

## 2014-12-08 MED ORDER — FLUCONAZOLE 150 MG PO TABS
150.0000 mg | ORAL_TABLET | ORAL | Status: DC
Start: 2014-12-08 — End: 2015-03-03

## 2014-12-08 NOTE — Telephone Encounter (Signed)
Unable to contact pt. 

## 2014-12-08 NOTE — Telephone Encounter (Signed)
-----   Message from Lora PaulaJosalyn C Funches, MD sent at 12/08/2014  8:06 AM EST ----- Screening HIV negative. Yeast on wet prep, sent in diflucan

## 2014-12-08 NOTE — Addendum Note (Signed)
Addended by: Dessa PhiFUNCHES, Nylan Nakatani on: 12/08/2014 08:08 AM   Modules accepted: Orders

## 2014-12-08 NOTE — Assessment & Plan Note (Signed)
Sent in diflucan for yeast on wet prep

## 2014-12-08 NOTE — Telephone Encounter (Signed)
-----   Message from Josalyn C Funches, MD sent at 12/08/2014  8:06 AM EST ----- Screening HIV negative. Yeast on wet prep, sent in diflucan 

## 2014-12-08 NOTE — Telephone Encounter (Signed)
Pt aware of lab results 

## 2014-12-08 NOTE — Telephone Encounter (Signed)
-----   Message from Lora PaulaJosalyn C Funches, MD sent at 12/08/2014  3:00 PM EST ----- Negative GC/chlam

## 2014-12-31 ENCOUNTER — Other Ambulatory Visit: Payer: Self-pay | Admitting: *Deleted

## 2014-12-31 ENCOUNTER — Other Ambulatory Visit: Payer: Self-pay | Admitting: Family Medicine

## 2014-12-31 DIAGNOSIS — M549 Dorsalgia, unspecified: Principal | ICD-10-CM

## 2014-12-31 DIAGNOSIS — G8929 Other chronic pain: Secondary | ICD-10-CM

## 2014-12-31 MED ORDER — INDOMETHACIN 50 MG PO CAPS: 50.0000 mg | ORAL_CAPSULE | Freq: Three times a day (TID) | ORAL | Status: DC | PRN

## 2014-12-31 NOTE — Telephone Encounter (Signed)
Refill send to [facility] 

## 2014-12-31 NOTE — Telephone Encounter (Signed)
Patient has come in today to request a medication refill for indomethacin (INDOCIN) 50 MG capsule; Patient presented to the Pharmacy and was told she need approval from PCP for additional refills; please f/u with patient about this request;

## 2015-01-05 ENCOUNTER — Other Ambulatory Visit: Payer: Self-pay | Admitting: *Deleted

## 2015-01-19 ENCOUNTER — Telehealth: Payer: Self-pay

## 2015-01-19 ENCOUNTER — Telehealth: Payer: Self-pay | Admitting: Family Medicine

## 2015-01-19 NOTE — Telephone Encounter (Signed)
Pt's called requesting antibiotics, advised to talk to nurse. Please f/u with pt

## 2015-01-19 NOTE — Telephone Encounter (Signed)
Pt stated has possible tooth infection x 2 days worsen last night Advised to come in to walking clinic today from 2-4

## 2015-01-19 NOTE — Telephone Encounter (Signed)
Patient already spoke with Dr Armen PickupFunches nurse Issue resolved

## 2015-01-20 ENCOUNTER — Encounter: Payer: Self-pay | Admitting: Family Medicine

## 2015-01-20 ENCOUNTER — Ambulatory Visit: Payer: Medicaid Other | Attending: Family Medicine | Admitting: Family Medicine

## 2015-01-20 VITALS — BP 98/65 | HR 60 | Temp 97.4°F | Resp 16 | Ht 68.0 in | Wt 235.0 lb

## 2015-01-20 DIAGNOSIS — M5412 Radiculopathy, cervical region: Secondary | ICD-10-CM | POA: Diagnosis not present

## 2015-01-20 DIAGNOSIS — B351 Tinea unguium: Secondary | ICD-10-CM | POA: Insufficient documentation

## 2015-01-20 DIAGNOSIS — M62830 Muscle spasm of back: Secondary | ICD-10-CM | POA: Diagnosis not present

## 2015-01-20 DIAGNOSIS — K047 Periapical abscess without sinus: Secondary | ICD-10-CM | POA: Diagnosis not present

## 2015-01-20 MED ORDER — CYCLOBENZAPRINE HCL 10 MG PO TABS: 10.0000 mg | ORAL_TABLET | Freq: Three times a day (TID) | ORAL | Status: DC | PRN

## 2015-01-20 MED ORDER — TRAMADOL HCL 50 MG PO TABS: 50.0000 mg | ORAL_TABLET | Freq: Three times a day (TID) | ORAL | Status: DC | PRN

## 2015-01-20 MED ORDER — TERBINAFINE HCL 250 MG PO TABS: 250.0000 mg | ORAL_TABLET | Freq: Every day | ORAL | Status: DC

## 2015-01-20 MED ORDER — PENICILLIN V POTASSIUM 250 MG PO TABS: 250.0000 mg | ORAL_TABLET | Freq: Three times a day (TID) | ORAL | Status: DC

## 2015-01-20 NOTE — Patient Instructions (Signed)
Ms. Lauren Flynn,  Thank you for coming in today  1. Dental pain: Start penicillin Tramadol for pain Keep dental appt in 5 days    2. Back pain with muscle spasm: Flexeril-muscle relaxer Continue indocin Heat, stretches and massage   3. R great toe pain: Ingrown and toenail fungus lamisil for 12 weeks Podiatry referral  4. L hand pain, burning sensations I suspect this is coming from neck or pinched nerves in arm Checking vit D and B12 to rule out deficiency Sleep on back with hands at side  F/u in 3 months for back pain  Dr. Armen PickupFunches

## 2015-01-20 NOTE — Progress Notes (Signed)
Patient here due to right lower jaw pain. Patient thinks she has an abscess but cannot go to the dentist until March 7th. Patient also here due to back pain and right arm pain.  Patient denies falling or an injury. Patient requesting an antibiotic for tooth pain.

## 2015-01-21 ENCOUNTER — Encounter: Payer: Self-pay | Admitting: Family Medicine

## 2015-01-21 LAB — VITAMIN B12: VITAMIN B 12: 635 pg/mL (ref 211–911)

## 2015-01-21 LAB — VITAMIN D 25 HYDROXY (VIT D DEFICIENCY, FRACTURES): Vit D, 25-Hydroxy: 27 ng/mL — ABNORMAL LOW (ref 30–100)

## 2015-01-21 NOTE — Assessment & Plan Note (Addendum)
Back pain with muscle spasm: Flexeril-muscle relaxer Continue indocin Heat, stretches and massage

## 2015-01-21 NOTE — Progress Notes (Signed)
   Subjective:    Patient ID: Lauren Flynn, female    DOB: 22-May-1971, 44 y.o.   MRN: [id number] CC; dental pain, R lower jaw, mid back pain, intermittent L hand burning  HPI 44 yo F f/u:  1. Dental pain: x 1  Weeks. Worsening pain. No fever. Pain with eating. R lower teeth. Has dental appt 01/25/15.  2. Mid back pain: x 1 week. No injury. Helped a friend move but denies heavy lifting or bending. No leg weakness. No fever. No skin rash.  3. L hand burning sensation: comes and goes, worse at night. No rash or trauma.   4. R great toe pain:  many months. No injury. Nail is thick.   Soc Hx: current smoker  Review of Systems As per HPI     Objective:   Physical Exam BP 98/65 mmHg  Pulse 60  Temp(Src) 97.4 F (36.3 C)  Resp 16  Ht 5\' 8"  (1.727 m)  Wt 235 lb (106.595 kg)  BMI 35.74 kg/m2  SpO2 97% General appearance: alert, cooperative and no distress Throat: abnormal findings: dentition: poor with carries upper and lower teeth. With gingival swelling.  Back: symmetric, no curvature. ROM normal. No CVA tenderness. MSK tenderness with palpable trigger points in thoracic back.  Extremities: extremities normal, atraumatic, no cyanosis or edema thickened and slightly ingrown R great toenail.      Assessment & Plan:

## 2015-01-21 NOTE — Assessment & Plan Note (Signed)
Dental pain: Start penicillin Tramadol for pain Keep dental appt in 5 days

## 2015-01-21 NOTE — Assessment & Plan Note (Signed)
3. R great toe pain: Ingrown and toenail fungus lamisil for 12 weeks Podiatry referral

## 2015-01-21 NOTE — Assessment & Plan Note (Signed)
4. L hand pain, burning sensations I suspect this is coming from neck or pinched nerves in arm Checking vit D and B12 to rule out deficiency Sleep on back with hands at side

## 2015-01-26 ENCOUNTER — Other Ambulatory Visit: Payer: Self-pay | Admitting: Family Medicine

## 2015-01-27 ENCOUNTER — Telehealth: Payer: Self-pay | Admitting: *Deleted

## 2015-01-27 MED ORDER — VITAMIN D (ERGOCALCIFEROL) 1.25 MG (50000 UNIT) PO CAPS: 50000.0000 [IU] | ORAL_CAPSULE | ORAL | Status: DC

## 2015-01-27 NOTE — Telephone Encounter (Signed)
-----   Message from Josalyn C Funches, MD sent at 01/21/2015  9:07 AM EST ----- Normal B12. Vit D insufficiency, will treat 

## 2015-01-27 NOTE — Telephone Encounter (Signed)
-----   Message from Lora PaulaJosalyn C Funches, MD sent at 01/21/2015  9:07 AM EST ----- Normal B12. Vit D insufficiency, will treat

## 2015-01-27 NOTE — Telephone Encounter (Signed)
Pt aware  Rx send to [facility]

## 2015-01-28 ENCOUNTER — Other Ambulatory Visit: Payer: Self-pay | Admitting: Family Medicine

## 2015-02-03 ENCOUNTER — Telehealth: Payer: Self-pay | Admitting: Family Medicine

## 2015-02-03 NOTE — Telephone Encounter (Signed)
Pt called requesting medication refill for gabapentin (NEURONTIN) 300 MG capsule, please f/u with pt

## 2015-02-04 ENCOUNTER — Telehealth: Payer: Self-pay | Admitting: Family Medicine

## 2015-02-04 ENCOUNTER — Other Ambulatory Visit: Payer: Self-pay | Admitting: *Deleted

## 2015-02-04 DIAGNOSIS — G8929 Other chronic pain: Secondary | ICD-10-CM

## 2015-02-04 DIAGNOSIS — M549 Dorsalgia, unspecified: Principal | ICD-10-CM

## 2015-02-04 MED ORDER — GABAPENTIN 300 MG PO CAPS: 300.0000 mg | ORAL_CAPSULE | Freq: Three times a day (TID) | ORAL | Status: AC

## 2015-02-04 NOTE — Telephone Encounter (Signed)
Pt called requesting refill for gabapentin (NEURONTIN) 300 MG capsule , please f/u with pt

## 2015-02-04 NOTE — Telephone Encounter (Signed)
Refills send to [facility]  

## 2015-02-26 ENCOUNTER — Ambulatory Visit: Payer: Medicaid Other | Admitting: Podiatrist

## 2015-03-03 ENCOUNTER — Telehealth: Payer: Self-pay | Admitting: *Deleted

## 2015-03-03 ENCOUNTER — Other Ambulatory Visit: Payer: Self-pay | Admitting: Family Medicine

## 2015-03-03 DIAGNOSIS — F329 Major depressive disorder, single episode, unspecified: Secondary | ICD-10-CM

## 2015-03-03 DIAGNOSIS — F32A Depression, unspecified: Secondary | ICD-10-CM

## 2015-03-03 DIAGNOSIS — N3281 Overactive bladder: Secondary | ICD-10-CM

## 2015-03-03 MED ORDER — OXYBUTYNIN CHLORIDE ER 10 MG PO TB24: 10.0000 mg | ORAL_TABLET | Freq: Every day | ORAL | Status: DC

## 2015-03-03 MED ORDER — BUPROPION HCL ER (XL) 300 MG PO TB24: 300.0000 mg | ORAL_TABLET | Freq: Every day | ORAL | Status: AC

## 2015-03-03 MED ORDER — SERTRALINE HCL 100 MG PO TABS: 100.0000 mg | ORAL_TABLET | Freq: Two times a day (BID) | ORAL | Status: AC

## 2015-03-03 NOTE — Telephone Encounter (Signed)
Pt requesting medicine refill 

## 2015-03-03 NOTE — Addendum Note (Signed)
Addended by: Dessa PhiFUNCHES, Teliah [place] on: 03/03/2015 03:24 PM   Modules accepted: Orders, Medications

## 2015-03-03 NOTE — Telephone Encounter (Signed)
Medications refilled

## 2015-03-03 NOTE — Telephone Encounter (Signed)
Pt notified Rx refills send to [facility]

## 2015-03-10 ENCOUNTER — Telehealth: Payer: Self-pay | Admitting: Family Medicine

## 2015-03-10 DIAGNOSIS — M549 Dorsalgia, unspecified: Principal | ICD-10-CM

## 2015-03-10 DIAGNOSIS — G8929 Other chronic pain: Secondary | ICD-10-CM

## 2015-03-10 NOTE — Telephone Encounter (Signed)
Patient called to request a referral to a different pain management center, [facility] is [facility] with Dr. Tyler DeisWheeler. Please f/u with pt.

## 2015-03-11 NOTE — Telephone Encounter (Signed)
Referral placed. Please inform patient.

## 2015-03-16 ENCOUNTER — Telehealth: Payer: Self-pay | Admitting: Family Medicine

## 2015-03-16 NOTE — Telephone Encounter (Signed)
Patient called stating that her insurance needs to speak with her PCP before oxybutynin (DITROPAN-XL) 10 MG 24 hr tablet can be refilled.Please f/u

## 2015-03-19 NOTE — Telephone Encounter (Signed)
Please see prior message regarding medication authorization. Please assist

## 2015-03-19 NOTE — Telephone Encounter (Signed)
Patient calling back to follow up on previous request for medication refill authorization.  Please assist

## 2015-03-19 NOTE — Telephone Encounter (Signed)
What is the # for her insurance?

## 2015-03-22 ENCOUNTER — Telehealth: Payer: Self-pay | Admitting: Family Medicine

## 2015-03-22 ENCOUNTER — Other Ambulatory Visit: Payer: Self-pay | Admitting: *Deleted

## 2015-03-22 DIAGNOSIS — N3281 Overactive bladder: Secondary | ICD-10-CM

## 2015-03-22 MED ORDER — OXYBUTYNIN CHLORIDE ER 10 MG PO TB24: 10.0000 mg | ORAL_TABLET | Freq: Every day | ORAL | Status: DC

## 2015-03-22 NOTE — Telephone Encounter (Signed)
Pt following up on medication authorization, please see previous notes.

## 2015-03-22 NOTE — Telephone Encounter (Signed)
Oxybutynin CL ER 10 mg tab prescribed.  It is not on the preferred medication list for Medicaid.  Oxybutynin is.  Please advise or change Rx.

## 2015-03-23 ENCOUNTER — Telehealth: Payer: Self-pay | Admitting: Family Medicine

## 2015-03-23 ENCOUNTER — Ambulatory Visit: Payer: Medicaid Other | Attending: Family Medicine | Admitting: Family Medicine

## 2015-03-23 ENCOUNTER — Encounter: Payer: Self-pay | Admitting: Family Medicine

## 2015-03-23 VITALS — BP 94/60 | HR 65 | Temp 98.7°F | Resp 16 | Ht 69.0 in | Wt 234.0 lb

## 2015-03-23 DIAGNOSIS — M25562 Pain in left knee: Secondary | ICD-10-CM | POA: Insufficient documentation

## 2015-03-23 DIAGNOSIS — M25511 Pain in right shoulder: Secondary | ICD-10-CM | POA: Diagnosis not present

## 2015-03-23 DIAGNOSIS — M25561 Pain in right knee: Secondary | ICD-10-CM

## 2015-03-23 DIAGNOSIS — Z720 Tobacco use: Secondary | ICD-10-CM | POA: Diagnosis not present

## 2015-03-23 DIAGNOSIS — N3281 Overactive bladder: Secondary | ICD-10-CM | POA: Insufficient documentation

## 2015-03-23 DIAGNOSIS — G8929 Other chronic pain: Secondary | ICD-10-CM

## 2015-03-23 MED ORDER — OXYBUTYNIN CHLORIDE 5 MG PO TABS: 5.0000 mg | ORAL_TABLET | Freq: Three times a day (TID) | ORAL | Status: AC

## 2015-03-23 MED ORDER — DICLOFENAC SODIUM 75 MG PO TBEC: 75.0000 mg | DELAYED_RELEASE_TABLET | Freq: Two times a day (BID) | ORAL | Status: AC

## 2015-03-23 NOTE — Progress Notes (Signed)
Complaining of lt Knee pain And Rt shoulder pain  Requesting Ortho referral

## 2015-03-23 NOTE — Telephone Encounter (Signed)
Patient called stating that her insurance needs to speak with her PCP before oxybutynin (DITROPAN-XL) 10 MG 24 hr tablet can be refilled.Please f/u with pt.

## 2015-03-23 NOTE — Telephone Encounter (Signed)
LVM to return call.

## 2015-03-23 NOTE — Assessment & Plan Note (Signed)
A: patient with overactive bladder, unable to get ditropan LA need PA P: Ditropan IR, 5 mg TID ordered

## 2015-03-23 NOTE — Telephone Encounter (Signed)
Pt seen in our office today.

## 2015-03-23 NOTE — Progress Notes (Signed)
   Subjective:    Patient ID: Lauren Flynn, female    DOB: 09/03/1971, 44 y.o.   MRN: [id number] CC: L knee pain, R shoulder pain  HPI 44 yo F with chronic pain   1. L knee pain: hx b/l knee pain. Had R knee scoped. Pains is worsening in L knee now. Pain with walking. There is some swelling. No redness. No recent trauma.  2. R shoulder pain: R posterior shoulder pain. No recent injury. Pain when lifting her arm.   3. Overactive bladder: leaking frequently. Prior auth needed for ditropan LA. Leaking is distressing. No dysuria.   Soc hx: current smoker   Review of Systems  Constitutional: Negative for fever and chills.  Musculoskeletal: Positive for myalgias, joint swelling, arthralgias and gait problem.       Objective:   Physical Exam BP 94/60 mmHg  Pulse 65  Temp(Src) 98.7 F (37.1 C) (Oral)  Resp 16  Ht 5\' 9"  (1.753 m)  Wt 234 lb (106.142 kg)  BMI 34.54 kg/m2  SpO2 98%  LMP 03/16/2015 General appearance: alert, cooperative and no distress   Shoulder: Inspection reveals no abnormalities, atrophy or asymmetry. Palpation is normal with no tenderness over AC joint or bicipital groove. ROM is limited in to 120 degrees in coronal plane   Knee: Mild effusion, no erythema, full ROM      Assessment & Plan:

## 2015-03-23 NOTE — Patient Instructions (Addendum)
Ms. Lauren Flynn,  Thank you for coming in today.  For worsening of L knee pain and R shoulder pain: 1. X-ray: b/l standing knees 2. Ortho referral placed today 3. Trial of voltaren to replace indocin as antiinflammatory  For overactive bladder: Ditropan IR 5 mg three times a day   F/u in 3 months for chronic pain and overactive bladder  Dr. Armen PickupFunches

## 2015-03-23 NOTE — Telephone Encounter (Signed)
Pt received a call this morning from us. Please follow up with pt.

## 2015-03-26 NOTE — Assessment & Plan Note (Signed)
For worsening of L knee pain and R shoulder pain: 1. X-ray: b/l standing knees 2. Ortho referral placed today 3. Trial of voltaren to replace indocin as antiinflammatory

## 2015-03-26 NOTE — Assessment & Plan Note (Addendum)
For worsening of L knee pain and R shoulder pain: 1. X-ray: b/l standing knees 2. Ortho referral placed today 3. Trial of voltaren to replace indocin as antiinflammatory  

## 2015-03-29 ENCOUNTER — Ambulatory Visit (HOSPITAL_COMMUNITY): Payer: Medicaid Other | Admitting: Psychiatry

## 2015-04-14 ENCOUNTER — Telehealth: Payer: Self-pay | Admitting: Family Medicine

## 2015-04-14 NOTE — Telephone Encounter (Signed)
Please call patient More info is needed to complete form received via fax Form is medicaid transportation exception form   Section 2 needs to be completed by patient, patient signature required   Section 3, what is the reason for exception If reason is to go to another provider out of the county provide name, address and phone # of provider as well a anticipated duration of care

## 2015-04-14 NOTE — Telephone Encounter (Signed)
Patient is returning phone call from [facility]MA, please f/u °

## 2015-04-14 NOTE — Telephone Encounter (Signed)
LVM to return call.

## 2015-04-16 ENCOUNTER — Ambulatory Visit (HOSPITAL_COMMUNITY): Payer: Medicaid Other | Admitting: Psychiatry

## 2015-04-20 NOTE — Telephone Encounter (Signed)
Patient [place] [facility] [place] and no longer requires transportation exception.

## 2015-04-30 ENCOUNTER — Telehealth: Payer: Self-pay | Admitting: Family Medicine

## 2015-04-30 ENCOUNTER — Other Ambulatory Visit: Payer: Self-pay | Admitting: Family Medicine

## 2015-04-30 DIAGNOSIS — M549 Dorsalgia, unspecified: Principal | ICD-10-CM

## 2015-04-30 DIAGNOSIS — G8929 Other chronic pain: Secondary | ICD-10-CM

## 2015-04-30 MED ORDER — INDOMETHACIN 50 MG PO CAPS
50.0000 mg | ORAL_CAPSULE | Freq: Three times a day (TID) | ORAL | Status: AC | PRN
Start: 2015-04-30 — End: ?

## 2015-04-30 NOTE — Telephone Encounter (Signed)
Indocin refilled, please inform patient.

## 2015-04-30 NOTE — Telephone Encounter (Signed)
Pt called requesting medication refill on indomethacin (INDOCIN) 50 MG capsule. Please f/u with patient

## 2015-05-12 ENCOUNTER — Encounter: Payer: Self-pay | Admitting: Family Medicine

## 2015-05-12 DIAGNOSIS — M549 Dorsalgia, unspecified: Principal | ICD-10-CM

## 2015-05-12 DIAGNOSIS — G8929 Other chronic pain: Secondary | ICD-10-CM

## 2015-05-13 ENCOUNTER — Telehealth: Payer: Self-pay | Admitting: Family Medicine

## 2015-05-13 DIAGNOSIS — F329 Major depressive disorder, single episode, unspecified: Secondary | ICD-10-CM

## 2015-05-13 DIAGNOSIS — F32A Depression, unspecified: Secondary | ICD-10-CM

## 2015-05-13 NOTE — Telephone Encounter (Signed)
Please call patient. Please verify risperidone dose and is she still under the care of Dr. Teodoro Kil.

## 2015-05-13 NOTE — Telephone Encounter (Signed)
LVM to return call.

## 2016-01-13 ENCOUNTER — Encounter (HOSPITAL_COMMUNITY): Payer: Self-pay | Admitting: Emergency Medicine

## 2016-01-13 ENCOUNTER — Emergency Department (HOSPITAL_COMMUNITY)
Admission: EM | Admit: 2016-01-13 | Discharge: 2016-01-13 | Disposition: A | Discharge status: Home / Self Care | Payer: Medicaid - Out of State | Attending: Emergency Medicine | Admitting: Emergency Medicine

## 2016-01-13 ENCOUNTER — Emergency Department (EMERGENCY_DEPARTMENT_HOSPITAL): Payer: Medicaid - Out of State

## 2016-01-13 ENCOUNTER — Emergency Department (HOSPITAL_COMMUNITY): Payer: Medicaid - Out of State

## 2016-01-13 DIAGNOSIS — R600 Localized edema: Secondary | ICD-10-CM | POA: Diagnosis not present

## 2016-01-13 DIAGNOSIS — Y9389 Activity, other specified: Secondary | ICD-10-CM | POA: Diagnosis not present

## 2016-01-13 DIAGNOSIS — G8929 Other chronic pain: Secondary | ICD-10-CM | POA: Diagnosis not present

## 2016-01-13 DIAGNOSIS — F17210 Nicotine dependence, cigarettes, uncomplicated: Secondary | ICD-10-CM | POA: Diagnosis not present

## 2016-01-13 DIAGNOSIS — F329 Major depressive disorder, single episode, unspecified: Secondary | ICD-10-CM | POA: Diagnosis not present

## 2016-01-13 DIAGNOSIS — M1611 Unilateral primary osteoarthritis, right hip: Secondary | ICD-10-CM | POA: Diagnosis not present

## 2016-01-13 DIAGNOSIS — R001 Bradycardia, unspecified: Secondary | ICD-10-CM | POA: Insufficient documentation

## 2016-01-13 DIAGNOSIS — M160 Bilateral primary osteoarthritis of hip: Secondary | ICD-10-CM

## 2016-01-13 DIAGNOSIS — S79921A Unspecified injury of right thigh, initial encounter: Secondary | ICD-10-CM | POA: Diagnosis present

## 2016-01-13 DIAGNOSIS — Y998 Other external cause status: Secondary | ICD-10-CM | POA: Diagnosis not present

## 2016-01-13 DIAGNOSIS — R609 Edema, unspecified: Secondary | ICD-10-CM

## 2016-01-13 DIAGNOSIS — D649 Anemia, unspecified: Secondary | ICD-10-CM

## 2016-01-13 DIAGNOSIS — Y9289 Other specified places as the place of occurrence of the external cause: Secondary | ICD-10-CM | POA: Insufficient documentation

## 2016-01-13 DIAGNOSIS — S7001XA Contusion of right hip, initial encounter: Secondary | ICD-10-CM

## 2016-01-13 DIAGNOSIS — X58XXXA Exposure to other specified factors, initial encounter: Secondary | ICD-10-CM | POA: Diagnosis not present

## 2016-01-13 DIAGNOSIS — M1612 Unilateral primary osteoarthritis, left hip: Secondary | ICD-10-CM | POA: Diagnosis not present

## 2016-01-13 DIAGNOSIS — S7011XA Contusion of right thigh, initial encounter: Secondary | ICD-10-CM | POA: Diagnosis not present

## 2016-01-13 DIAGNOSIS — Z79899 Other long term (current) drug therapy: Secondary | ICD-10-CM | POA: Insufficient documentation

## 2016-01-13 DIAGNOSIS — F419 Anxiety disorder, unspecified: Secondary | ICD-10-CM | POA: Insufficient documentation

## 2016-01-13 DIAGNOSIS — M79604 Pain in right leg: Secondary | ICD-10-CM | POA: Diagnosis not present

## 2016-01-13 DIAGNOSIS — M79605 Pain in left leg: Secondary | ICD-10-CM

## 2016-01-13 LAB — CBC WITH DIFFERENTIAL/PLATELET
Basophils Absolute: 0 10*3/uL (ref 0.0–0.1)
Basophils Relative: 1 %
Eosinophils Absolute: 0.3 10*3/uL (ref 0.0–0.7)
Eosinophils Relative: 4 %
HCT: 34.1 % — ABNORMAL LOW (ref 36.0–46.0)
HEMOGLOBIN: 11.5 g/dL — AB (ref 12.0–15.0)
Lymphocytes Relative: 38 %
Lymphs Abs: 2.8 10*3/uL (ref 0.7–4.0)
MCH: 29.6 pg (ref 26.0–34.0)
MCHC: 33.7 g/dL (ref 30.0–36.0)
MCV: 87.9 fL (ref 78.0–100.0)
MONOS PCT: 6 %
Monocytes Absolute: 0.4 10*3/uL (ref 0.1–1.0)
NEUTROS PCT: 51 %
Neutro Abs: 3.8 10*3/uL (ref 1.7–7.7)
Platelets: 230 10*3/uL (ref 150–400)
RBC: 3.88 MIL/uL (ref 3.87–5.11)
RDW: 14.6 % (ref 11.5–15.5)
WBC: 7.3 10*3/uL (ref 4.0–10.5)

## 2016-01-13 LAB — COMPREHENSIVE METABOLIC PANEL
ALK PHOS: 54 U/L (ref 38–126)
ALT: 12 U/L — ABNORMAL LOW (ref 14–54)
AST: 16 U/L (ref 15–41)
Albumin: 3.4 g/dL — ABNORMAL LOW (ref 3.5–5.0)
Anion gap: 9 (ref 5–15)
BILIRUBIN TOTAL: 0.4 mg/dL (ref 0.3–1.2)
BUN: 8 mg/dL (ref 6–20)
CALCIUM: 9.9 mg/dL (ref 8.9–10.3)
CO2: 22 mmol/L (ref 22–32)
Chloride: 104 mmol/L (ref 101–111)
Creatinine, Ser: 0.8 mg/dL (ref 0.44–1.00)
GFR calc Af Amer: >60 mL/min (ref >60)
Glucose, Bld: 105 mg/dL — ABNORMAL HIGH (ref 65–99)
Potassium: 4.1 mmol/L (ref 3.5–5.1)
Sodium: 135 mmol/L (ref 135–145)
Total Protein: 5.8 g/dL — ABNORMAL LOW (ref 6.5–8.1)

## 2016-01-13 MED ORDER — OXYCODONE-ACETAMINOPHEN 5-325 MG PO TABS
2.0000 | ORAL_TABLET | Freq: Once | ORAL | Status: AC
  Administered 2016-01-13: 2 via ORAL
  Filled 2016-01-13: qty 2

## 2016-01-13 MED ORDER — FUROSEMIDE 20 MG PO TABS: 20.0000 mg | ORAL_TABLET | Freq: Every day | ORAL | Status: AC

## 2016-01-13 NOTE — ED Notes (Addendum)
Pt tranported to Vascular

## 2016-01-13 NOTE — ED Notes (Signed)
Patient transported to X-ray 

## 2016-01-13 NOTE — ED Notes (Signed)
Pt. reports worsening left lower leg pain with swelling onset this evening , pt. fell on her right side yesterday , denies injury/ambulatory . Respirations unlabored.

## 2016-01-13 NOTE — ED Provider Notes (Signed)
CSN: [id number]     Arrival date & time 01/13/16  0100 History   First MD Initiated Contact with Patient 01/13/16 [phone]     Chief Complaint  Patient presents with  . Leg Pain     (Consider location/radiation/quality/duration/timing/severity/associated sxs/prior Treatment) HPI Comments: Chenille Toor is a 45 y.o. female with a PMHx of chronic back pain, anxiety, depression, and a PSHx of appendectomy and cholecystectomy, who presents to the ED with complaints of bilateral lower extremity pain and swelling 1 week with the left leg worse than the right. She is present his 6/10 intermittent nonradiating sharp lower leg pain, worse with walking, and improved with oxycodone 30 mg and MS Contin 60 mg. Associated symptoms include swelling of the left greater than the right. She states that she fell on her right hip yesterday and she is also having right hip pain. She denies any head injury or LOC, numbness, tingling, weakness, fevers, chills, chest pain, shortness breath, abdominal pain, nausea, vomiting, diarrhea, constipation, dysuria, hematuria, incontinence of urine and stool, erythema or warmth of the lower extremities, recent travel/surgery/immobilization, or estrogen use. No hx of cardiac illness. No prior LE swelling.  Patient is a 45 y.o. female presenting with leg pain. The history is provided by the patient and medical records. No language interpreter was used.  Leg Pain Location:  Leg Time since incident:  1 week Injury: no   Leg location:  R lower leg Pain details:    Quality:  Sharp   Radiates to:  Does not radiate   Severity:  Moderate   Onset quality:  Gradual   Duration:  1 week   Timing:  Intermittent   Progression:  Unchanged Chronicity:  New Prior injury to area:  No Relieved by:  Nothing Worsened by:  Bearing weight Ineffective treatments: MS contin and oxycodone. Associated symptoms: swelling   Associated symptoms: no decreased ROM, no fever, no muscle weakness, no  numbness and no tingling     Past Medical History  Diagnosis Date  . Chronic back pain 2000    lower back pain, no surgeries   . Anxiety Dx 2005  . Depression Dx 2005   Past Surgical History  Procedure Laterality Date  . Appendectomy  1983   . Cholecystectomy  2003    Family History  Problem Relation Age of Onset  . Heart disease Father   . Asthma Sister   . Cancer Maternal Grandmother     colon  . Cancer Paternal Grandfather   . Cancer Maternal Grandfather     colon   Social History  Substance Use Topics  . Smoking status: Current Every Day Smoker -- 0.25 packs/day for 27 years    Types: Cigarettes  . Smokeless tobacco: Never Used  . Alcohol Use: No     Comment: rare    OB History    No data available     Review of Systems  Constitutional: Negative for fever and chills.  HENT: Negative for facial swelling (no head inj).   Respiratory: Negative for shortness of breath.   Cardiovascular: Positive for leg swelling. Negative for chest pain.  Gastrointestinal: Negative for nausea, vomiting, abdominal pain, diarrhea and constipation.  Genitourinary: Negative for dysuria and hematuria.  Musculoskeletal: Positive for arthralgias. Negative for myalgias and joint swelling.  Skin: Negative for color change and wound.  Allergic/Immunologic: Negative for immunocompromised state.  Neurological: Negative for syncope, weakness and numbness.  Psychiatric/Behavioral: Negative for confusion.   10 Systems reviewed and are negative for  acute change except as noted in the HPI.    Allergies  Review of patient's allergies indicates no known allergies.  Home Medications   Prior to Admission medications   Medication Sig Start Date End Date Taking? Authorizing Provider  buPROPion (WELLBUTRIN XL) 300 MG 24 hr tablet Take 1 tablet (300 mg total) by mouth daily. 03/03/15  Yes Josalyn Funches, MD  gabapentin (NEURONTIN) 300 MG capsule Take 1 capsule (300 mg total) by mouth 3 (three)  times daily. Patient taking differently: Take 600 mg by mouth 3 (three) times daily.  02/04/15  Yes Josalyn Funches, MD  oxybutynin (DITROPAN) 5 MG tablet Take 1 tablet (5 mg total) by mouth 3 (three) times daily. 03/23/15  Yes Josalyn Funches, MD  risperiDONE (RISPERDAL) 0.5 MG tablet Take 1 tablet (0.5 mg total) by mouth daily. Patient taking differently: Take 1 mg by mouth daily.  12/07/14  Yes Josalyn Funches, MD  risperiDONE (RISPERDAL) 3 MG tablet Take 1 tablet (3 mg total) by mouth at bedtime. 12/07/14  Yes Josalyn Funches, MD  sertraline (ZOLOFT) 100 MG tablet Take 1 tablet (100 mg total) by mouth 2 (two) times daily. Patient taking differently: Take 200 mg by mouth daily.  03/03/15  Yes Dessa Phi, MD  diclofenac (VOLTAREN) 75 MG EC tablet Take 1 tablet (75 mg total) by mouth 2 (two) times daily. Patient not taking: Reported on 01/13/2016 03/23/15   Dessa Phi, MD  indomethacin (INDOCIN) 50 MG capsule Take 1 capsule (50 mg total) by mouth 3 (three) times daily as needed. Patient not taking: Reported on 01/13/2016 04/30/15   Dessa Phi, MD   BP 91/52 mmHg  Pulse 53  Temp(Src) 98.6 F (37 C) (Oral)  Resp 16  Ht 5\' 8"  (1.727 m)  Wt 112.492 kg  BMI 37.72 kg/m2  SpO2 100% Physical Exam  Constitutional: She is oriented to person, place, and time. Vital signs are normal. She appears well-developed and well-nourished.  Non-toxic appearance. No distress.  Afebrile, nontoxic, NAD, initially sleeping. BP soft, but consistent with prior visits  HENT:  Head: Normocephalic and atraumatic.  Mouth/Throat: Oropharynx is clear and moist and mucous membranes are normal.  Eyes: Conjunctivae and EOM are normal. Right eye exhibits no discharge. Left eye exhibits no discharge.  Neck: Normal range of motion. Neck supple.  Cardiovascular: Regular rhythm, normal heart sounds and intact distal pulses.  Bradycardia present.  Exam reveals no gallop and no friction rub.   No murmur heard. Bradycardic  which is similar to prior visits, reg rhythm, nl s1/s2, no m/r/g, distal pulses intact, 3+ pitting edema in LLE with 2+ in RLE  Pulmonary/Chest: Effort normal and breath sounds normal. No respiratory distress. She has no decreased breath sounds. She has no wheezes. She has no rhonchi. She has no rales.  Abdominal: Soft. Normal appearance and bowel sounds are normal. She exhibits no distension. There is no tenderness. There is no rigidity, no rebound, no guarding, no CVA tenderness, no tenderness at McBurney's point and negative Murphy's sign.  Musculoskeletal: Normal range of motion.       Right hip: She exhibits tenderness and bony tenderness. She exhibits normal range of motion, normal strength, no swelling, no crepitus and no laceration.       Legs: R hip with FROM intact, neg log roll test, with TTP in lateral joint line into greater trochanteric area, no bruising or abrasions, no erythema or warmth, no swelling or crepitus, no limb length discrepancy.  B/l lower legs with pitting edema  as noted above (3+ in LLE and 2+ in RLE). No skin changes. Strength and sensation grossly intact, distal pulses intact  Neurological: She is alert and oriented to person, place, and time. She has normal strength. No sensory deficit.  Skin: Skin is warm, dry and intact. No rash noted.  Psychiatric: She has a normal mood and affect.  Nursing note and vitals reviewed.   ED Course  Procedures (including critical care time) Labs Review Labs Reviewed  COMPREHENSIVE METABOLIC PANEL - Abnormal; Notable for the following:    Glucose, Bld 105 (*)    Total Protein 5.8 (*)    Albumin 3.4 (*)    ALT 12 (*)    All other components within normal limits  CBC WITH DIFFERENTIAL/PLATELET - Abnormal; Notable for the following:    Hemoglobin 11.5 (*)    HCT 34.1 (*)    All other components within normal limits    Imaging Review Progress Notes by Gwendolyn Fill at 01/13/2016 9:46 AM    Author: Lawrence Marseilles  Simonetti Service: Vascular Lab Author Type: Cardiovascular Sonographer   Filed: 01/13/2016 9:48 AM Note Time: 01/13/2016 9:46 AM Status: Signed   Editor: Lawrence Marseilles Simonetti (Cardiovascular Sonographer)     Expand All Collapse All   *Preliminary Results* Bilateral lower extremity venous duplex completed. Study was technically difficult and limited due to pitting edema. Bilateral lower extremities are negative for deep vein thrombosis. There is no evidence of Baker's cyst bilaterally.  01/13/2016  Gertie Fey, RVT, RDCS, RDMS       Dg Hip Unilat With Pelvis 2-3 Views Right  01/13/2016  CLINICAL DATA:  Initial valuation for acute right hip pain status post fall. EXAM: DG HIP (WITH OR WITHOUT PELVIS) 2-3V RIGHT COMPARISON:  None. FINDINGS: Visualized bony pelvis intact. No acute abnormality about the left hip. Severe degenerative osteoarthritic changes about both hips, right worse than left. No acute fracture or dislocation. Femoral heads in normal aligned with the acetabulum. SI joints approximated. Degenerative changes within the lower lumbar spine. No acute soft tissue abnormality. IMPRESSION: 1. No acute fracture or dislocation. 2. Severe degenerative osteoarthrosis about the hips bilaterally, right worse than left. Electronically Signed   By: Rise Mu M.D.   On: 01/13/2016 06:59   I have personally reviewed and evaluated these images and lab results as part of my medical decision-making.   EKG Interpretation None      MDM   Final diagnoses:  Peripheral edema  Contusion of right hip and thigh, initial encounter  Bilateral leg pain  Chronic anemia  Bilateral hip joint arthritis    45 y.o. female here with BLE edema with L>R, also with R hip pain since a fall yesterday. On exam, 3+ pitting edema on L leg, 2+ on R leg, extending to mid-pretibial area. Mild tenderness to R hip. No erythema or warmth of legs, no bruising to hip. NVI with soft compartments. Labs  obtained prior to my arrival reveal no acute changes, mild anemia which is chronic and unchanged. Will obtain xray of R hip, and LE ultrasounds to eval for DVT especially since the L is worse than the right. If DVT study neg, could potentially give short supply of lasix but she would need close PCP f/up to find out the etiology of the swelling. Will give pain meds and reassess shortly.   9:58 AM  Xray of R hip neg for acute findings, has some arthritis noted in b/l hips. Likely the cause of her pain, in  addition to contusion of hip from fall. DVT study was negative, no DVTs to explain her leg swelling, which is reassurring. Unclear exactly why she has LE swelling, but could be venous stasis vs dependent edema vs cardiac issue,etc. Will need further work up as an outpatient. Will start on low-dose lasix x4 days, discussed compression stockings and elevation. Use home pain meds for pain. F/up with PCP in 5-7 days. I explained the diagnosis and have given explicit precautions to return to the ER including for any other new or worsening symptoms. The patient understands and accepts the medical plan as it's been dictated and I have answered their questions. Discharge instructions concerning home care and prescriptions have been given. The patient is STABLE and is discharged to home in good condition.    BP 115/61 mmHg  Pulse 56  Temp(Src) 98.6 F (37 C) (Oral)  Resp 16  Ht  (1.727 m)  Wt 112.492 kg  BMI 37.72 kg/m2  SpO2 93%  LMP 03/16/2015  Meds ordered this encounter  Medications  . oxyCODONE-acetaminophen (PERCOCET/ROXICET) 5-325 MG per tablet 2 tablet    Sig:   . furosemide (LASIX) 20 MG tablet    Sig: Take 1 tablet (20 mg total) by mouth daily.    Dispense:  4 tablet    Refill:  0    Order Specific Question:  Supervising Provider    Answer:  Eber Hong [[id number]]     Anurag Scarfo Camprubi-Soms, PA-C 01/13/16 1007  Loren Racer, MD 01/13/16 2310

## 2016-01-13 NOTE — Discharge Instructions (Signed)
Your leg swelling is not due to blood clots. Start taking lasix as directed for leg swelling. Use compression stockings to help as well, in addition to elevating your legs to help with swelling. Use your home pain medications as needed for pain, follow up with your regular doctor or pain clinic to have ongoing refills of these. Use ice to the areas of pain on your hip. Follow up with your regular doctor in 5-7 days for recheck of symptoms. Return to the ER for changes or worsening symptoms.   Edema Edema is an abnormal buildup of fluids. It is more common in your legs and thighs. Painless swelling of the feet and ankles is more likely as a person ages. It also is common in looser skin, like around your eyes. HOME CARE   Keep the affected body part above the level of the heart while lying down.  Do not sit still or stand for a long time.  Do not put anything right under your knees when you lie down.  Do not wear tight clothes on your upper legs.  Exercise your legs to help the puffiness (swelling) go down.  Wear elastic bandages or support stockings as told by your doctor.  A low-salt diet may help lessen the puffiness.  Only take medicine as told by your doctor. GET HELP IF:  Treatment is not working.  You have heart, liver, or kidney disease and notice that your skin looks puffy or shiny.  You have puffiness in your legs that does not get better when you raise your legs.  You have sudden weight gain for no reason. GET HELP RIGHT AWAY IF:   You have shortness of breath or chest pain.  You cannot breathe when you lie down.  You have pain, redness, or warmth in the areas that are puffy.  You have heart, liver, or kidney disease and get edema all of a sudden.  You have a fever and your symptoms get worse all of a sudden. MAKE SURE YOU:   Understand these instructions.  Will watch your condition.  Will get help right away if you are not doing well or get worse.   This  information is not intended to replace advice given to you by your health care provider. Make sure you discuss any questions you have with your health care provider.   Document Released: 04/24/2008 Document Revised: 11/11/2013 Document Reviewed: 08/29/2013 Elsevier Interactive Patient Education 2016 [employer]  Contusion A contusion is a deep bruise. Contusions are the result of a blunt injury to tissues and muscle fibers under the skin. The injury causes bleeding under the skin. The skin overlying the contusion may turn blue, purple, or yellow. Minor injuries will give you a painless contusion, but more severe contusions may stay painful and swollen for a few weeks.  CAUSES  This condition is usually caused by a blow, trauma, or direct force to an area of the body. SYMPTOMS  Symptoms of this condition include:  Swelling of the injured area.  Pain and tenderness in the injured area.  Discoloration. The area may have redness and then turn blue, purple, or yellow. DIAGNOSIS  This condition is diagnosed based on a physical exam and medical history. An X-ray, CT scan, or MRI may be needed to determine if there are any associated injuries, such as broken bones (fractures). TREATMENT  Specific treatment for this condition depends on what area of the body was injured. In general, the best treatment for a contusion  is resting, icing, applying pressure to (compression), and elevating the injured area. This is often called the RICE strategy. Over-the-counter anti-inflammatory medicines may also be recommended for pain control.  HOME CARE INSTRUCTIONS   Rest the injured area.  If directed, apply ice to the injured area:  Put ice in a plastic bag.  Place a towel between your skin and the bag.  Leave the ice on for 20 minutes, 2-3 times per day.  If directed, apply light compression to the injured area using an elastic bandage. Make sure the bandage is not wrapped too tightly. Remove and  reapply the bandage as directed by your health care provider.  If possible, raise (elevate) the injured area above the level of your heart while you are sitting or lying down.  Take over-the-counter and prescription medicines only as told by your health care provider. SEEK MEDICAL CARE IF:  Your symptoms do not improve after several days of treatment.  Your symptoms get worse.  You have difficulty moving the injured area. SEEK IMMEDIATE MEDICAL CARE IF:   You have severe pain.  You have numbness in a hand or foot.  Your hand or foot turns pale or cold.   This information is not intended to replace advice given to you by your health care provider. Make sure you discuss any questions you have with your health care provider.   Document Released: 08/16/2005 Document Revised: 07/28/2015 Document Reviewed: 03/24/2015 Elsevier Interactive Patient Education 2016 [employer]  Cryotherapy Cryotherapy means treatment with cold. Ice or gel packs can be used to reduce both pain and swelling. Ice is the most helpful within the first 24 to 48 hours after an injury or flare-up from overusing a muscle or joint. Sprains, strains, spasms, burning pain, shooting pain, and aches can all be eased with ice. Ice can also be used when recovering from surgery. Ice is effective, has very few side effects, and is safe for most people to use. PRECAUTIONS  Ice is not a safe treatment option for people with:  Raynaud phenomenon. This is a condition affecting small blood vessels in the extremities. Exposure to cold may cause your problems to return.  Cold hypersensitivity. There are many forms of cold hypersensitivity, including:  Cold urticaria. Red, itchy hives appear on the skin when the tissues begin to warm after being iced.  Cold erythema. This is a red, itchy rash caused by exposure to cold.  Cold hemoglobinuria. Red blood cells break down when the tissues begin to warm after being iced. The  hemoglobin that carry oxygen are passed into the urine because they cannot combine with blood proteins fast enough.  Numbness or altered sensitivity in the area being iced. If you have any of the following conditions, do not use ice until you have discussed cryotherapy with your caregiver:  Heart conditions, such as arrhythmia, angina, or chronic heart disease.  High blood pressure.  Healing wounds or open skin in the area being iced.  Current infections.  Rheumatoid arthritis.  Poor circulation.  Diabetes. Ice slows the blood flow in the region it is applied. This is beneficial when trying to stop inflamed tissues from spreading irritating chemicals to surrounding tissues. However, if you expose your skin to cold temperatures for too long or without the proper protection, you can damage your skin or nerves. Watch for signs of skin damage due to cold. HOME CARE INSTRUCTIONS Follow these tips to use ice and cold packs safely.  Place a dry or damp towel  between the ice and skin. A damp towel will cool the skin more quickly, so you may need to shorten the time that the ice is used.  For a more rapid response, add gentle compression to the ice.  Ice for no more than 10 to 20 minutes at a time. The bonier the area you are icing, the less time it will take to get the benefits of ice.  Check your skin after 5 minutes to make sure there are no signs of a poor response to cold or skin damage.  Rest 20 minutes or more between uses.  Once your skin is numb, you can end your treatment. You can test numbness by very lightly touching your skin. The touch should be so light that you do not see the skin dimple from the pressure of your fingertip. When using ice, most people will feel these normal sensations in this order: cold, burning, aching, and numbness.  Do not use ice on someone who cannot communicate their responses to pain, such as small children or people with dementia. HOW TO MAKE AN ICE  PACK Ice packs are the most common way to use ice therapy. Other methods include ice massage, ice baths, and cryosprays. Muscle creams that cause a cold, tingly feeling do not offer the same benefits that ice offers and should not be used as a substitute unless recommended by your caregiver. To make an ice pack, do one of the following:  Place crushed ice or a bag of frozen vegetables in a sealable plastic bag. Squeeze out the excess air. Place this bag inside another plastic bag. Slide the bag into a pillowcase or place a damp towel between your skin and the bag.  Mix 3 parts water with 1 part rubbing alcohol. Freeze the mixture in a sealable plastic bag. When you remove the mixture from the freezer, it will be slushy. Squeeze out the excess air. Place this bag inside another plastic bag. Slide the bag into a pillowcase or place a damp towel between your skin and the bag. SEEK MEDICAL CARE IF:  You develop white spots on your skin. This may give the skin a blotchy (mottled) appearance.  Your skin turns blue or pale.  Your skin becomes waxy or hard.  Your swelling gets worse. MAKE SURE YOU:   Understand these instructions.  Will watch your condition.  Will get help right away if you are not doing well or get worse.   This information is not intended to replace advice given to you by your health care provider. Make sure you discuss any questions you have with your health care provider.   Document Released: 07/03/2011 Document Revised: 11/27/2014 Document Reviewed: 07/03/2011 Elsevier Interactive Patient Education [employer]

## 2016-01-13 NOTE — Progress Notes (Signed)
*  Preliminary Results* Bilateral lower extremity venous duplex completed. Study was technically difficult and limited due to pitting edema. Bilateral lower extremities are negative for deep vein thrombosis. There is no evidence of Baker's cyst bilaterally.  01/13/2016  Gertie Fey, RVT, RDCS, RDMS

## 2016-07-18 IMAGING — CR DG HIP (WITH OR WITHOUT PELVIS) 2-3V*R*
3 series · 3 of 3 positions shown · non-contrast
Comparison: None.

CLINICAL DATA: Initial valuation for acute right hip pain status
post fall.

EXAM:
DG HIP (WITH OR WITHOUT PELVIS) 2-3V RIGHT

[pelvis ap]
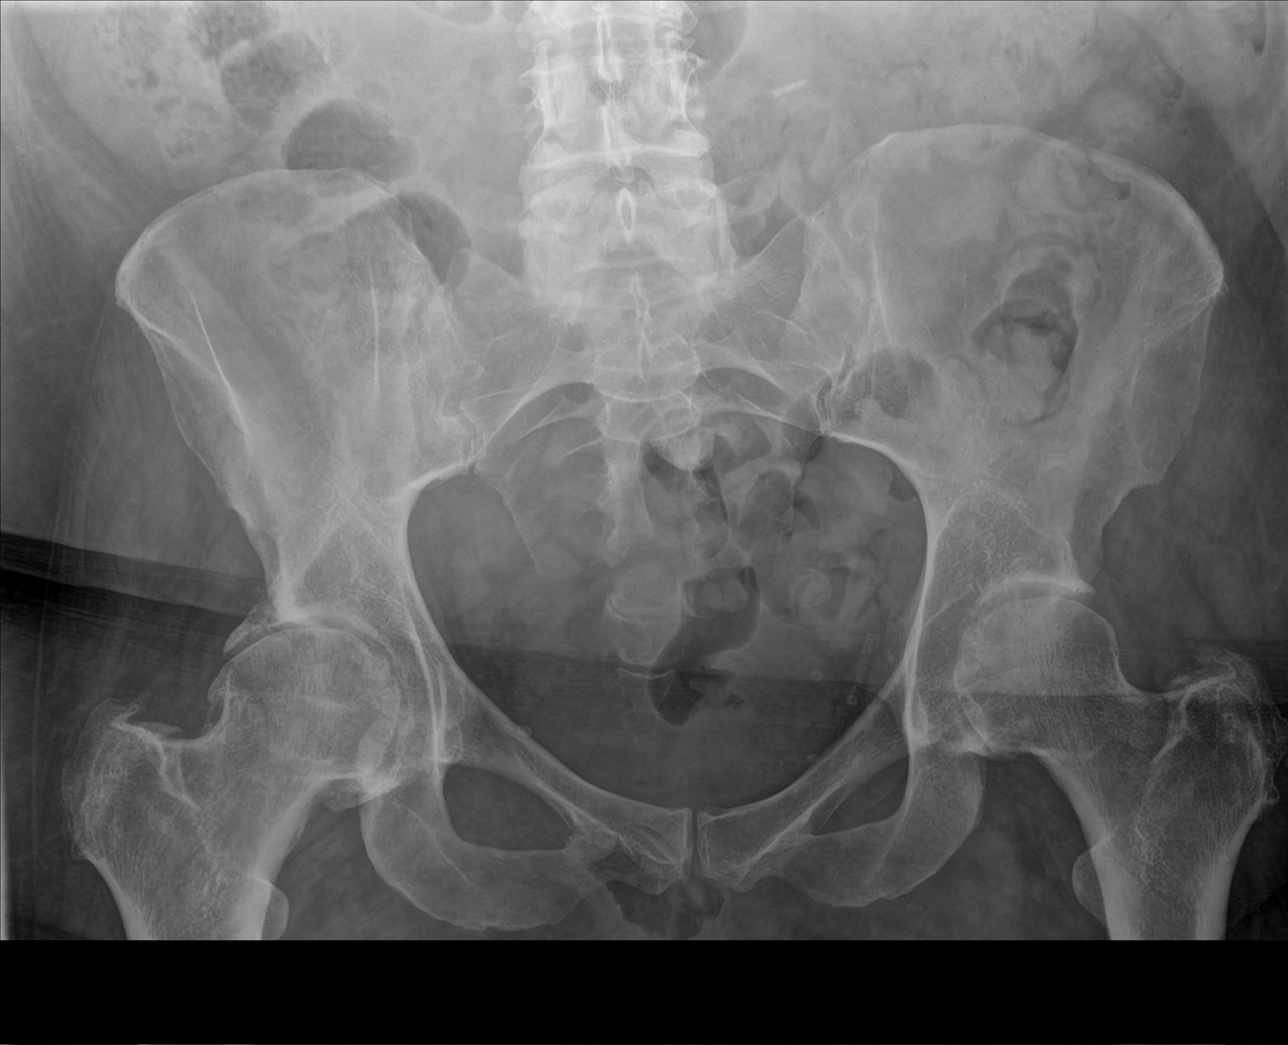

[hip ap]
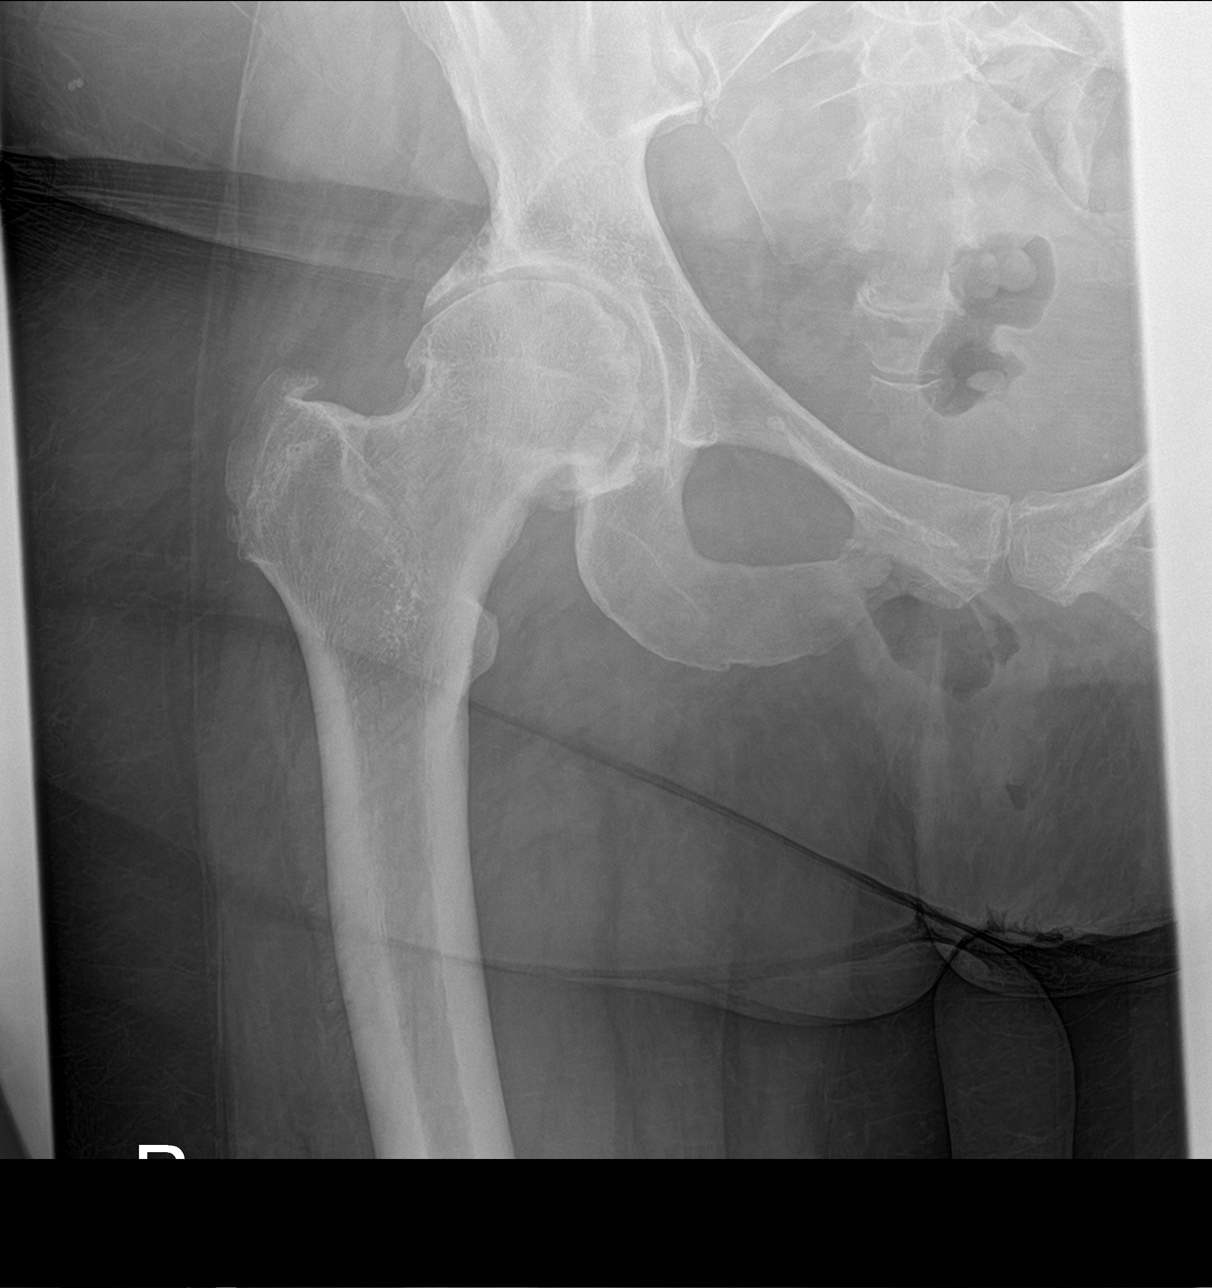

[hip lat]
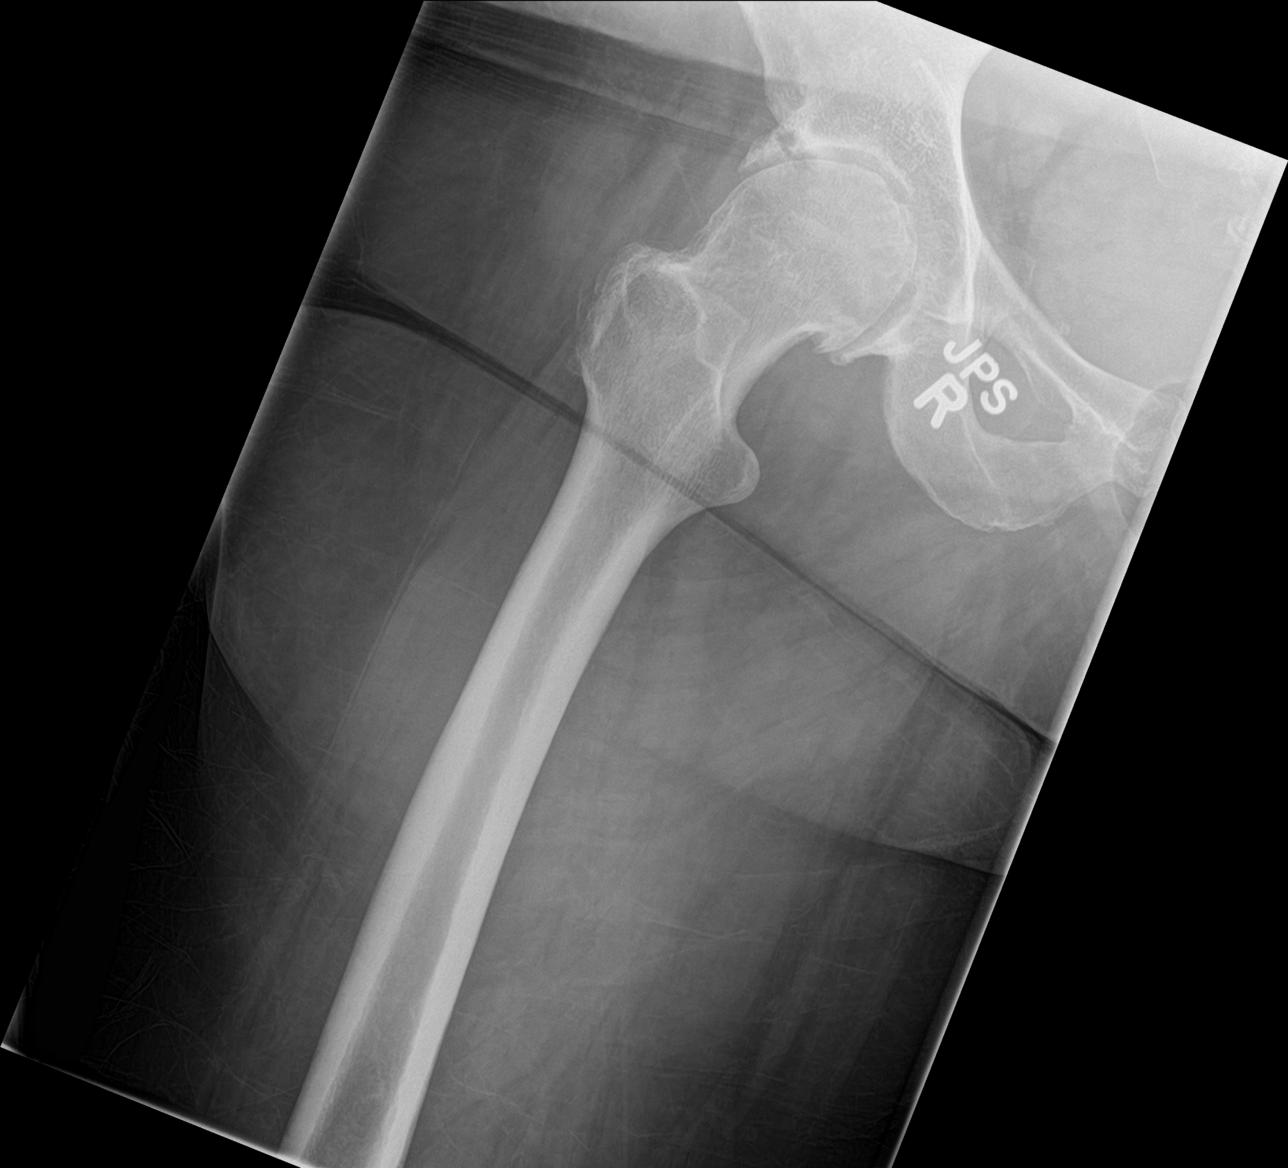

[3 of 3 positions shown; findings below may reference images not displayed]

FINDINGS: Visualized bony pelvis intact. No acute abnormality about the left
hip. Severe degenerative osteoarthritic changes about both hips,
right worse than left. No acute fracture or dislocation. Femoral
heads in normal aligned with the acetabulum. SI joints approximated.
Degenerative changes within the lower lumbar spine.

No acute soft tissue abnormality.
IMPRESSION: 1. No acute fracture or dislocation.
2. Severe degenerative osteoarthrosis about the hips bilaterally,
right worse than left.

## 2019-01-01 ENCOUNTER — Inpatient Hospital Stay
Admission: EM | Admit: 2019-01-01 | Discharge: 2019-01-11 | DRG: 139 | Disposition: A | Discharge status: Home Health | Payer: Medicaid Other | Attending: Family Medicine | Admitting: Family Medicine

## 2019-01-01 ENCOUNTER — Emergency Department: Payer: Medicaid Other

## 2019-01-01 DIAGNOSIS — I2510 Atherosclerotic heart disease of native coronary artery without angina pectoris: Secondary | ICD-10-CM | POA: Diagnosis present

## 2019-01-01 DIAGNOSIS — Z8709 Personal history of other diseases of the respiratory system: Secondary | ICD-10-CM

## 2019-01-01 DIAGNOSIS — F419 Anxiety disorder, unspecified: Secondary | ICD-10-CM | POA: Diagnosis present

## 2019-01-01 DIAGNOSIS — Z825 Family history of asthma and other chronic lower respiratory diseases: Secondary | ICD-10-CM

## 2019-01-01 DIAGNOSIS — Z8249 Family history of ischemic heart disease and other diseases of the circulatory system: Secondary | ICD-10-CM

## 2019-01-01 DIAGNOSIS — J189 Pneumonia, unspecified organism: Principal | ICD-10-CM | POA: Diagnosis present

## 2019-01-01 DIAGNOSIS — N3281 Overactive bladder: Secondary | ICD-10-CM | POA: Diagnosis present

## 2019-01-01 DIAGNOSIS — Z6841 Body Mass Index (BMI) 40.0 and over, adult: Secondary | ICD-10-CM

## 2019-01-01 DIAGNOSIS — Z720 Tobacco use: Secondary | ICD-10-CM

## 2019-01-01 DIAGNOSIS — I272 Pulmonary hypertension, unspecified: Secondary | ICD-10-CM | POA: Diagnosis present

## 2019-01-01 DIAGNOSIS — R0602 Shortness of breath: Secondary | ICD-10-CM

## 2019-01-01 DIAGNOSIS — I252 Old myocardial infarction: Secondary | ICD-10-CM

## 2019-01-01 DIAGNOSIS — J9601 Acute respiratory failure with hypoxia: Secondary | ICD-10-CM | POA: Diagnosis present

## 2019-01-01 DIAGNOSIS — J441 Chronic obstructive pulmonary disease with (acute) exacerbation: Secondary | ICD-10-CM | POA: Diagnosis present

## 2019-01-01 DIAGNOSIS — Z87891 Personal history of nicotine dependence: Secondary | ICD-10-CM

## 2019-01-01 DIAGNOSIS — Z79899 Other long term (current) drug therapy: Secondary | ICD-10-CM

## 2019-01-01 DIAGNOSIS — J440 Chronic obstructive pulmonary disease with acute lower respiratory infection: Secondary | ICD-10-CM | POA: Diagnosis present

## 2019-01-01 DIAGNOSIS — R739 Hyperglycemia, unspecified: Secondary | ICD-10-CM | POA: Diagnosis present

## 2019-01-01 DIAGNOSIS — G4733 Obstructive sleep apnea (adult) (pediatric): Secondary | ICD-10-CM | POA: Diagnosis present

## 2019-01-01 DIAGNOSIS — Z8701 Personal history of pneumonia (recurrent): Secondary | ICD-10-CM

## 2019-01-01 HISTORY — DX: Anxiety disorder, unspecified: F41.9

## 2019-01-01 HISTORY — DX: Panic disorder (episodic paroxysmal anxiety): F41.0

## 2019-01-01 HISTORY — DX: Chronic obstructive pulmonary disease, unspecified: J44.9

## 2019-01-01 LAB — COMPREHENSIVE METABOLIC PANEL
ALT: 9 U/L (ref 0–55)
AST (SGOT): 18 U/L (ref 5–34)
Albumin/Globulin Ratio: 1 (ref 0.9–2.2)
Albumin: 2.7 g/dL — ABNORMAL LOW (ref 3.5–5.0)
Alkaline Phosphatase: 80 U/L (ref 37–106)
Anion Gap: 9 (ref 5.0–15.0)
BUN: 8 mg/dL (ref 7.0–19.0)
Bilirubin, Total: 0.2 mg/dL (ref 0.2–1.2)
CO2: 19 mEq/L — ABNORMAL LOW (ref 22–29)
Calcium: 9.4 mg/dL (ref 8.5–10.5)
Chloride: 112 mEq/L — ABNORMAL HIGH (ref 100–111)
Creatinine: 0.9 mg/dL (ref 0.6–1.0)
Globulin: 2.7 g/dL (ref 2.0–3.6)
Glucose: 109 mg/dL — ABNORMAL HIGH (ref 70–100)
Potassium: 4.5 mEq/L (ref 3.5–5.1)
Protein, Total: 5.4 g/dL — ABNORMAL LOW (ref 6.0–8.3)
Sodium: 140 mEq/L (ref 136–145)

## 2019-01-01 LAB — TROPONIN I: Troponin I: <0.01 ng/mL (ref 0.00–0.05)

## 2019-01-01 LAB — GFR: EGFR: >60

## 2019-01-01 LAB — CBC AND DIFFERENTIAL
Absolute NRBC: 0 10*3/uL (ref 0.00–0.00)
Basophils Absolute Automated: 0.08 10*3/uL (ref 0.00–0.08)
Basophils Automated: 0.6 %
Eosinophils Absolute Automated: 0.95 10*3/uL — ABNORMAL HIGH (ref 0.00–0.44)
Eosinophils Automated: 7.5 %
Hematocrit: 38 % (ref 34.7–43.7)
Hgb: 12 g/dL (ref 11.4–14.8)
Immature Granulocytes Absolute: 0.04 10*3/uL (ref 0.00–0.07)
Immature Granulocytes: 0.3 %
Lymphocytes Absolute Automated: 1.43 10*3/uL (ref 0.42–3.22)
Lymphocytes Automated: 11.4 %
MCH: 28.9 pg (ref 25.1–33.5)
MCHC: 31.6 g/dL (ref 31.5–35.8)
MCV: 91.6 fL (ref 78.0–96.0)
MPV: 11 fL (ref 8.9–12.5)
Monocytes Absolute Automated: 0.48 10*3/uL (ref 0.21–0.85)
Monocytes: 3.8 %
Neutrophils Absolute: 9.61 10*3/uL — ABNORMAL HIGH (ref 1.10–6.33)
Neutrophils: 76.4 %
Nucleated RBC: 0 /100 WBC (ref 0.0–0.0)
Platelets: 248 10*3/uL (ref 142–346)
RBC: 4.15 10*6/uL (ref 3.90–5.10)
RDW: 14 % (ref 11–15)
WBC: 12.59 10*3/uL — ABNORMAL HIGH (ref 3.10–9.50)

## 2019-01-01 LAB — HEMOLYSIS INDEX: Hemolysis Index: 12 (ref 0–18)

## 2019-01-01 LAB — B-TYPE NATRIURETIC PEPTIDE: B-Natriuretic Peptide: 83 pg/mL (ref 0–100)

## 2019-01-01 MED ORDER — OXYCODONE-ACETAMINOPHEN 5-325 MG PO TABS
1.0000 | ORAL_TABLET | ORAL | Status: DC | PRN
Start: 2019-01-01 — End: 2019-01-02
  Administered 2019-01-02: 1 via ORAL
  Filled 2019-01-01: qty 1

## 2019-01-01 MED ORDER — ALBUTEROL-IPRATROPIUM 2.5-0.5 (3) MG/3ML IN SOLN
3.00 mL | RESPIRATORY_TRACT | Status: DC
Start: 2019-01-02 — End: 2019-01-02

## 2019-01-01 MED ORDER — OXYCODONE-ACETAMINOPHEN 5-325 MG PO TABS
2.00 | ORAL_TABLET | Freq: Once | ORAL | Status: AC
Start: 2019-01-01 — End: 2019-01-01
  Administered 2019-01-01: 21:00:00 2 via ORAL
  Filled 2019-01-01: qty 2

## 2019-01-01 MED ORDER — METHYLPREDNISOLONE SODIUM SUCC 125 MG IJ SOLR
125.00 mg | Freq: Once | INTRAMUSCULAR | Status: AC
Start: 2019-01-01 — End: 2019-01-01
  Administered 2019-01-01: 21:00:00 125 mg via INTRAVENOUS
  Filled 2019-01-01: qty 2

## 2019-01-01 MED ORDER — ACETAMINOPHEN 325 MG PO TABS
650.0000 mg | ORAL_TABLET | Freq: Once | ORAL | Status: DC | PRN
Start: 2019-01-01 — End: 2019-01-02

## 2019-01-01 MED ORDER — LEVOFLOXACIN IN D5W 750 MG/150ML IV SOLN
750.00 mg | Freq: Once | INTRAVENOUS | Status: AC
Start: 2019-01-01 — End: 2019-01-01
  Administered 2019-01-01: 22:00:00 750 mg via INTRAVENOUS
  Filled 2019-01-01: qty 150

## 2019-01-01 MED ORDER — BENZONATATE 100 MG PO CAPS
100.00 mg | ORAL_CAPSULE | Freq: Once | ORAL | Status: AC
Start: 2019-01-01 — End: 2019-01-01
  Administered 2019-01-01: 21:00:00 100 mg via ORAL
  Filled 2019-01-01: qty 1

## 2019-01-01 MED ORDER — ALBUTEROL-IPRATROPIUM 2.5-0.5 (3) MG/3ML IN SOLN
6.00 mL | Freq: Once | RESPIRATORY_TRACT | Status: AC
Start: 2019-01-01 — End: 2019-01-01
  Administered 2019-01-01: 22:00:00 6 mL via RESPIRATORY_TRACT
  Filled 2019-01-01: qty 6

## 2019-01-01 MED ORDER — ONDANSETRON HCL 4 MG/2ML IJ SOLN
4.00 mg | Freq: Once | INTRAMUSCULAR | Status: DC | PRN
Start: 2019-01-01 — End: 2019-01-02

## 2019-01-01 NOTE — ED Notes (Signed)
[facility] ED NURSING NOTE FOR THE RECEIVING INPATIENT NURSE   ED NURSE Zackry Deines   [place] [phone]   ED CHARGE RN [phone]   ADMISSION INFORMATION   [name] is a 48 y.o. female admitted with a diagnosis of:    1. SOB (shortness of breath)    2. History of COPD         Isolation: None   Allergies: Iodine solution [povidone iodine]   Holding Orders confirmed? Yes   Belongings Documented? Yes   Home medications sent to pharmacy confirmed? No   NURSING CARE   Mental Status: alert and oriented   ADL: Needs assistance with ADLs   Ambulation: ambulates with: walker   Pertinent Information  and Safety Concerns: Dyspnea on exertion, used wheelchair to bathroom     ED Medications: See MAR for medications administered in the ED   CT / NIH   CT Head ordered on this patient?  No   NIH/Dysphagia assessment done prior to admission? No    VITAL SIGNS   Time BP Temp Pulse Resp SpO2   2217 118/86 98.1 86 24 95%   IV LINES   IV Catheter Size: 20 g  Peripheral IV 01/01/19 Left Antecubital (Active)   Site Assessment Clean;Dry;Intact 01/01/2019  9:13 PM   Line Status Saline Locked 01/01/2019  9:13 PM   Dressing Status Clean;Dry;Intact 01/01/2019  9:13 PM   Number of days: 0        LAB RESULTS   Labs Reviewed   CBC AND DIFFERENTIAL - Abnormal; Notable for the following components:       Result Value    WBC 12.59 (*)     Neutrophils Absolute 9.61 (*)     Abs Eos Automated 0.95 (*)     All other components within normal limits   COMPREHENSIVE METABOLIC PANEL - Abnormal; Notable for the following components:    Glucose 109 (*)     Chloride 112 (*)     CO2 19 (*)     Protein, Total 5.4 (*)     Albumin 2.7 (*)     All other components within normal limits   TROPONIN I   B-TYPE NATRIURETIC PEPTIDE   HEMOLYSIS INDEX   GFR   LACTIC ACID, PLASMA

## 2019-01-01 NOTE — ED Provider Notes (Signed)
EMERGENCY DEPARTMENT HISTORY AND PHYSICAL EXAM     Physician/Midlevel provider first contact with patient: 01/01/19 2017         History of Presenting Illness:  History Provided By: Patient    [name] is a 48 y.o. female pw's of breath and cough x2 days.  She is unable to walk next door from her bedroom to the bathroom.  She denies fever, chills or sore throat.  She had double pneumonia diagnosed at [facility] approx 3 months ago.  She has been smoking since she was 48 years old and stopped smoking 2 days ago.  She reports using albuterol and another inhaler.  She did not received flu vaccine this season.    Reviewed Past Medical History, Surgical History, Family History and Social as documented.    PCP: [name], MD  SPECIALISTS:    Review of Systems:  Review of Systems   Constitutional: Negative for chills and fever.   Respiratory: Positive for shortness of breath.    Cardiovascular: Positive for chest pain. Negative for leg swelling.   Musculoskeletal: Positive for back pain.   All other systems reviewed as negative.    Physical Exam:  Vitals:    01/03/19 1900 01/03/19 2040 01/03/19 2225 01/04/19 0010   BP: 138/79   147/66   Pulse: 98   92   Resp: 20   19   Temp: 98.6 F (37 C)      TempSrc: Oral      SpO2: 93% 95% 97% 95%   Weight:       Height:           Physical Exam   Constitutional: Patient is alert.  Well nourished.  NAD  Head: Atraumatic.   Eyes: EOMI. PERRL  ENT:  MMM.   Neck:  FROM. No spinal tenderness. Neck supple.    Cardiovascular: Normal rate and regular rhythm.   Pulmonary/Chest: Effort normal and breath sounds diffuse exp wheezing.  Mild resp distress.   Abdominal: Soft. There is no tenderness. Bowel sounds present and normal.    Musculoskeletal:  No lower extremity edema or tenderness.    Neurological: Patient is alert and oriented to person, place, and time.  No focal deficits.   Skin: Skin is warm and dry.    Cardiac Monitor:  Rate: 70s  Rhythm:   NSR    EKG:  Interpreted by the EP.   Time Interpreted: 2040   Rate: 78   Rhythm: Normal Sinus Rhythm    Interpretation: No ST/TW changes.    Comparison: No prior study is available for comparison.    Old Medical Records: Nursing notes.    Patient Update Notes:  ED Course as of Jan 04 317   Wed Jan 01, 2019   2213 Still wheezing and feeling SOB.      [BK]      ED Course User Index  [BK] Donia Guiles, MD       Provider Notes: Coughing and shortness of breath.  Chest x-ray with bilateral pneumonia (Dx w/pna 3 months ago as well).  Persistent wheezing despite duo nebs x2.  Antibiotics and steroids.    Clinical Impression:   1. SOB (shortness of breath)    2. History of COPD        ED Disposition     ED Disposition Condition Date/Time Comment    Observation  Wed Jan 01, 2019 10:46 PM Admitting Physician: Baruch Gouty [[id number]]   Diagnosis:  SOB (shortness of breath) [241880]   Estimated Length of Stay: < 2 midnights   Tentative Discharge Plan?: Home or Self Care [1]   Patient Class: Observation [104]   Bed request comments: remote tele            CRITICAL CARE: The high probability of sudden, clinically significant deterioration in the patient's condition required the highest level of my preparedness to intervene urgently.    The services I provided to this patient were to treat and/or prevent clinically significant deterioration that could result in: mortality.  Services included the following: chart data review, reviewing nursing notes and/or old charts, documentation time, consultant collaboration regarding findings and treatment options, medication orders and management, direct patient care, re-evaluations, vital sign assessments and ordering, interpreting and reviewing diagnostic studies/lab tests.    Aggregate critical care time was 35 minutes, which includes only time during which I was engaged in work directly related to the patient's care, as described above, whether at the bedside or elsewhere in  the Emergency Department.  It did not include time spent performing other reported procedures or the services of residents, students, nurses or physician assistants.          This note was generated by the Epic EMR system/ Dragon speech recognition and may contain inherent errors or omissions not intended by the user. Grammatical errors, random word insertions, deletions and pronoun errors  are occasional consequences of this technology due to software limitations. Not all errors are caught or corrected. If there are questions or concerns about the content of this note or information contained within the body of this dictation they should be addressed directly with the author for clarification     Donia Guiles, MD  01/04/19 0320

## 2019-01-01 NOTE — ED Triage Notes (Addendum)
Pt BIBA for SOB that started Monday and got worse. Pt states she feels like 100 lbs are sitting on her chest. Initially denied chest pain but states it occurs when coughing. Pt started using O2 at home when couldn't breathe. Pt states she has hx of COPD but was never officially diagnosed. Has chronic back, hip, and knee pain. EMS placed pt on 2 LPM NC

## 2019-01-01 NOTE — ED Notes (Signed)
Bed: BL19  Expected date:   Expected time:   Means of arrival:   Comments:  PG [phone]

## 2019-01-02 ENCOUNTER — Observation Stay: Payer: Medicaid Other

## 2019-01-02 ENCOUNTER — Encounter: Payer: Self-pay | Admitting: Internal Medicine

## 2019-01-02 LAB — BLOOD GAS, ARTERIAL
Arterial Total CO2: 34.7 mEq/L — ABNORMAL HIGH (ref 24.0–30.0)
Base Excess, Arterial: -7.9 mEq/L — ABNORMAL LOW (ref <=2.0)
HCO3, Arterial: 17 mEq/L — ABNORMAL LOW (ref 23.0–29.0)
O2 Flow: 4 L/min
O2 Sat, Arterial: 96.4 % (ref 95.0–100.0)
Temperature: 37
pCO2, Arterial: 34.6 mmHg — ABNORMAL LOW (ref 35.0–45.0)
pH, Arterial: 7.312 — ABNORMAL LOW (ref 7.350–7.450)
pO2, Arterial: 85.5 mmHg (ref 80.0–90.0)

## 2019-01-02 LAB — ECG 12-LEAD
Atrial Rate: 78 {beats}/min
P Axis: 84 degrees
P-R Interval: 160 ms
Q-T Interval: 368 ms
QRS Duration: 90 ms
QTC Calculation (Bezet): 419 ms
R Axis: 33 degrees
T Axis: 99 degrees
Ventricular Rate: 78 {beats}/min

## 2019-01-02 LAB — CBC
Absolute NRBC: 0 10*3/uL (ref 0.00–0.00)
Hematocrit: 39.4 % (ref 34.7–43.7)
Hgb: 12.5 g/dL (ref 11.4–14.8)
MCH: 29 pg (ref 25.1–33.5)
MCHC: 31.7 g/dL (ref 31.5–35.8)
MCV: 91.4 fL (ref 78.0–96.0)
MPV: 10.7 fL (ref 8.9–12.5)
Nucleated RBC: 0 /100 WBC (ref 0.0–0.0)
Platelets: 263 10*3/uL (ref 142–346)
RBC: 4.31 10*6/uL (ref 3.90–5.10)
RDW: 14 % (ref 11–15)
WBC: 11.35 10*3/uL — ABNORMAL HIGH (ref 3.10–9.50)

## 2019-01-02 LAB — RESPIRATORY PATHOGEN PANEL, PCR (FILMARRAY) (SOFT)
Adenovirus: NOT DETECTED
Bordetella pertussis: NOT DETECTED
Chlamydophila pneumoniae: NOT DETECTED
Coronavirus 229E: NOT DETECTED
Coronavirus HKU1: NOT DETECTED
Coronavirus NL63: NOT DETECTED
Coronavirus OC43: NOT DETECTED
Human Metapneumovirus: NOT DETECTED
Human Rhinovirus/Enterovirus: NOT DETECTED
Influenza A/H1: NOT DETECTED
Influenza A/H3: NOT DETECTED
Influenza A: NOT DETECTED
Influenza AH1 - 2009: NOT DETECTED
Influenza B: NOT DETECTED
Mycoplasma pneumoniae: NOT DETECTED
Parainfluenza Virus 1: NOT DETECTED
Parainfluenza Virus 2: NOT DETECTED
Parainfluenza Virus 3: NOT DETECTED
Parainfluenza Virus 4: NOT DETECTED
Respiratory Syncytial Virus: NOT DETECTED

## 2019-01-02 LAB — GLUCOSE WHOLE BLOOD - POCT: Whole Blood Glucose POCT: 208 mg/dL — ABNORMAL HIGH (ref 70–100)

## 2019-01-02 LAB — HEMOLYSIS INDEX: Hemolysis Index: 5 (ref 0–18)

## 2019-01-02 LAB — BASIC METABOLIC PANEL
Anion Gap: 10 (ref 5.0–15.0)
BUN: 9 mg/dL (ref 7.0–19.0)
CO2: 17 mEq/L — ABNORMAL LOW (ref 22–29)
Calcium: 9.3 mg/dL (ref 8.5–10.5)
Chloride: 110 mEq/L (ref 100–111)
Creatinine: 0.9 mg/dL (ref 0.6–1.0)
Glucose: 155 mg/dL — ABNORMAL HIGH (ref 70–100)
Potassium: 4.3 mEq/L (ref 3.5–5.1)
Sodium: 137 mEq/L (ref 136–145)

## 2019-01-02 LAB — TROPONIN I
Troponin I: <0.01 ng/mL (ref 0.00–0.05)
Troponin I: <0.01 ng/mL (ref 0.00–0.05)

## 2019-01-02 LAB — C-REACTIVE PROTEIN HIGH SENSITIVE: C-Reactive Protein, High Sensitive: 17.2 mg/dL — ABNORMAL HIGH (ref 0.00–0.50)

## 2019-01-02 LAB — LACTIC ACID, PLASMA: Lactic Acid: 1.5 mmol/L (ref 0.2–2.0)

## 2019-01-02 LAB — HEMOGLOBIN A1C
Average Estimated Glucose: 99.7 mg/dL
Hemoglobin A1C: 5.1 % (ref 4.6–5.9)

## 2019-01-02 LAB — PROCALCITONIN: Procalcitonin: 0.03 (ref 0.00–0.10)

## 2019-01-02 LAB — HCG QUANTITATIVE: hCG, Quant.: <1.2

## 2019-01-02 LAB — GFR: EGFR: >60

## 2019-01-02 LAB — IHS D-DIMER: D-Dimer: 1.04 ug/mL FEU — ABNORMAL HIGH (ref 0.00–0.50)

## 2019-01-02 MED ORDER — ENOXAPARIN SODIUM 80 MG/0.8ML SC SOLN
0.50 mg/kg | Freq: Every day | SUBCUTANEOUS | Status: DC
Start: 2019-01-02 — End: 2019-01-11
  Administered 2019-01-02 – 2019-01-11 (×8): 70 mg via SUBCUTANEOUS
  Filled 2019-01-02 (×10): qty 0.8

## 2019-01-02 MED ORDER — GUAIFENESIN ER 600 MG PO TB12
600.00 mg | ORAL_TABLET | Freq: Two times a day (BID) | ORAL | Status: DC
Start: 2019-01-02 — End: 2019-01-11
  Administered 2019-01-02 – 2019-01-11 (×19): 600 mg via ORAL
  Filled 2019-01-02 (×21): qty 1

## 2019-01-02 MED ORDER — INSULIN LISPRO 100 UNIT/ML SC SOLN
1.00 [IU] | Freq: Every evening | SUBCUTANEOUS | Status: DC
Start: 2019-01-02 — End: 2019-01-11
  Administered 2019-01-02 – 2019-01-04 (×2): 1 [IU] via SUBCUTANEOUS
  Filled 2019-01-02 (×2): qty 3

## 2019-01-02 MED ORDER — PREDNISONE 20 MG PO TABS
40.0000 mg | ORAL_TABLET | Freq: Every morning | ORAL | Status: DC
Start: 2019-01-03 — End: 2019-01-02

## 2019-01-02 MED ORDER — ONDANSETRON HCL 4 MG/2ML IJ SOLN
4.00 mg | Freq: Four times a day (QID) | INTRAMUSCULAR | Status: DC | PRN
Start: 2019-01-02 — End: 2019-01-11

## 2019-01-02 MED ORDER — SODIUM CHLORIDE 0.9 % IV MBP
1.00 g | Freq: Two times a day (BID) | INTRAVENOUS | Status: DC
Start: 2019-01-02 — End: 2019-01-06
  Administered 2019-01-02 – 2019-01-06 (×8): 1 g via INTRAVENOUS
  Filled 2019-01-02 (×9): qty 1000

## 2019-01-02 MED ORDER — SENNOSIDES-DOCUSATE SODIUM 8.6-50 MG PO TABS
2.0000 | ORAL_TABLET | Freq: Every evening | ORAL | Status: DC | PRN
Start: 2019-01-02 — End: 2019-01-11
  Administered 2019-01-06: 02:00:00 2 via ORAL
  Filled 2019-01-02: qty 2

## 2019-01-02 MED ORDER — FLUTICASONE FUROATE-VILANTEROL 200-25 MCG/INH IN AEPB
1.00 | INHALATION_SPRAY | Freq: Every morning | RESPIRATORY_TRACT | Status: DC
Start: 2019-01-02 — End: 2019-01-11
  Administered 2019-01-03 – 2019-01-11 (×8): 1 via RESPIRATORY_TRACT
  Filled 2019-01-02: qty 14

## 2019-01-02 MED ORDER — INFLUENZA VAC SPLIT QUAD 0.5 ML IM SUSY
0.50 mL | PREFILLED_SYRINGE | INTRAMUSCULAR | Status: DC | PRN
Start: 2019-01-02 — End: 2019-01-11

## 2019-01-02 MED ORDER — GABAPENTIN 400 MG PO CAPS
800.00 mg | ORAL_CAPSULE | Freq: Three times a day (TID) | ORAL | Status: DC
Start: 2019-01-02 — End: 2019-01-11
  Administered 2019-01-02 – 2019-01-11 (×27): 800 mg via ORAL
  Filled 2019-01-02 (×27): qty 2

## 2019-01-02 MED ORDER — LEVOFLOXACIN 500 MG PO TABS
750.0000 mg | ORAL_TABLET | Freq: Every day | ORAL | Status: DC
Start: 2019-01-02 — End: 2019-01-02

## 2019-01-02 MED ORDER — NALOXONE HCL 0.4 MG/ML IJ SOLN (WRAP)
0.20 mg | INTRAMUSCULAR | Status: DC | PRN
Start: 2019-01-02 — End: 2019-01-11

## 2019-01-02 MED ORDER — RISAQUAD PO CAPS
1.00 | ORAL_CAPSULE | Freq: Every day | ORAL | Status: DC
Start: 2019-01-02 — End: 2019-01-11
  Administered 2019-01-02 – 2019-01-11 (×10): 1 via ORAL
  Filled 2019-01-02 (×13): qty 1

## 2019-01-02 MED ORDER — HYDROCORTISONE SOD SUC (PF) 100 MG IJ SOLR (WRAP)
200.00 mg | Freq: Once | INTRAMUSCULAR | Status: AC
Start: 2019-01-02 — End: 2019-01-02
  Administered 2019-01-02: 12:00:00 200 mg via INTRAVENOUS
  Filled 2019-01-02: qty 4

## 2019-01-02 MED ORDER — PANTOPRAZOLE SODIUM 40 MG PO TBEC
40.00 mg | DELAYED_RELEASE_TABLET | Freq: Every morning | ORAL | Status: DC
Start: 2019-01-02 — End: 2019-01-11
  Administered 2019-01-02 – 2019-01-11 (×10): 40 mg via ORAL
  Filled 2019-01-02 (×10): qty 1

## 2019-01-02 MED ORDER — ONDANSETRON 4 MG PO TBDP
4.00 mg | ORAL_TABLET | Freq: Four times a day (QID) | ORAL | Status: DC | PRN
Start: 2019-01-02 — End: 2019-01-11

## 2019-01-02 MED ORDER — NICOTINE 14 MG/24HR TD PT24
1.00 | MEDICATED_PATCH | Freq: Every day | TRANSDERMAL | Status: DC
Start: 2019-01-02 — End: 2019-01-11
  Administered 2019-01-02 – 2019-01-11 (×10): 1 via TRANSDERMAL
  Filled 2019-01-02 (×11): qty 1

## 2019-01-02 MED ORDER — DIPHENHYDRAMINE HCL 50 MG/ML IJ SOLN
25.00 mg | Freq: Four times a day (QID) | INTRAMUSCULAR | Status: DC | PRN
Start: 2019-01-02 — End: 2019-01-11

## 2019-01-02 MED ORDER — INSULIN LISPRO 100 UNIT/ML SC SOLN
1.00 [IU] | Freq: Three times a day (TID) | SUBCUTANEOUS | Status: DC
Start: 2019-01-02 — End: 2019-01-11

## 2019-01-02 MED ORDER — METHYLPREDNISOLONE SODIUM SUCC 40 MG IJ SOLR
40.0000 mg | Freq: Three times a day (TID) | INTRAMUSCULAR | Status: DC
Start: 2019-01-02 — End: 2019-01-02

## 2019-01-02 MED ORDER — DIPHENHYDRAMINE HCL 25 MG PO CAPS
50.00 mg | ORAL_CAPSULE | Freq: Once | ORAL | Status: AC
Start: 2019-01-02 — End: 2019-01-02
  Administered 2019-01-02: 14:00:00 50 mg via ORAL
  Filled 2019-01-02: qty 2

## 2019-01-02 MED ORDER — ALPRAZOLAM 0.5 MG PO TABS
0.5000 mg | ORAL_TABLET | Freq: Two times a day (BID) | ORAL | Status: DC | PRN
Start: 2019-01-02 — End: 2019-01-11
  Administered 2019-01-02 – 2019-01-10 (×14): 0.5 mg via ORAL
  Filled 2019-01-02 (×14): qty 1

## 2019-01-02 MED ORDER — METHYLPREDNISOLONE SODIUM SUCC 40 MG IJ SOLR
20.00 mg | Freq: Three times a day (TID) | INTRAMUSCULAR | Status: DC
Start: 2019-01-02 — End: 2019-01-02
  Administered 2019-01-02: 06:00:00 20 mg via INTRAVENOUS
  Filled 2019-01-02: qty 1

## 2019-01-02 MED ORDER — INDOMETHACIN 25 MG PO CAPS
50.00 mg | ORAL_CAPSULE | Freq: Three times a day (TID) | ORAL | Status: DC
Start: 2019-01-02 — End: 2019-01-02
  Administered 2019-01-02: 12:00:00 50 mg via ORAL
  Filled 2019-01-02 (×4): qty 2

## 2019-01-02 MED ORDER — CEFTRIAXONE SODIUM 1 G IJ SOLR
1.00 g | INTRAMUSCULAR | Status: DC
Start: 2019-01-02 — End: 2019-01-02
  Administered 2019-01-02: 12:00:00 1 g via INTRAVENOUS
  Filled 2019-01-02: qty 1000

## 2019-01-02 MED ORDER — GLUCAGON 1 MG IJ SOLR (WRAP)
1.00 mg | INTRAMUSCULAR | Status: DC | PRN
Start: 2019-01-02 — End: 2019-01-11

## 2019-01-02 MED ORDER — DEXTROSE 10 % IV BOLUS
125.00 mL | INTRAVENOUS | Status: DC | PRN
Start: 2019-01-02 — End: 2019-01-11

## 2019-01-02 MED ORDER — NITROGLYCERIN 0.4 MG SL SUBL
0.40 mg | SUBLINGUAL_TABLET | SUBLINGUAL | Status: DC | PRN
Start: 2019-01-02 — End: 2019-01-11

## 2019-01-02 MED ORDER — BENZONATATE 100 MG PO CAPS
200.00 mg | ORAL_CAPSULE | Freq: Three times a day (TID) | ORAL | Status: DC | PRN
Start: 2019-01-02 — End: 2019-01-11
  Administered 2019-01-02 – 2019-01-09 (×12): 200 mg via ORAL
  Filled 2019-01-02 (×12): qty 2

## 2019-01-02 MED ORDER — SERTRALINE HCL 50 MG PO TABS
100.0000 mg | ORAL_TABLET | Freq: Two times a day (BID) | ORAL | Status: DC
Start: 2019-01-02 — End: 2019-01-11
  Administered 2019-01-02 – 2019-01-11 (×19): 100 mg via ORAL
  Filled 2019-01-02 (×19): qty 2

## 2019-01-02 MED ORDER — RISPERIDONE 1 MG PO TABS
3.0000 mg | ORAL_TABLET | Freq: Two times a day (BID) | ORAL | Status: DC
Start: 2019-01-02 — End: 2019-01-11
  Administered 2019-01-02 – 2019-01-11 (×19): 3 mg via ORAL
  Filled 2019-01-02 (×7): qty 3
  Filled 2019-01-02: qty 1
  Filled 2019-01-02 (×5): qty 3
  Filled 2019-01-02: qty 2
  Filled 2019-01-02 (×6): qty 3

## 2019-01-02 MED ORDER — ACETAMINOPHEN 325 MG PO TABS
650.0000 mg | ORAL_TABLET | ORAL | Status: DC | PRN
Start: 2019-01-02 — End: 2019-01-11
  Administered 2019-01-02 – 2019-01-03 (×2): 650 mg via ORAL
  Filled 2019-01-02 (×2): qty 2

## 2019-01-02 MED ORDER — DOXYCYCLINE MONOHYDRATE 100 MG PO CAPS
100.00 mg | ORAL_CAPSULE | Freq: Two times a day (BID) | ORAL | Status: DC
Start: 2019-01-02 — End: 2019-01-11
  Administered 2019-01-02 – 2019-01-11 (×18): 100 mg via ORAL
  Filled 2019-01-02 (×20): qty 1

## 2019-01-02 MED ORDER — IOHEXOL 350 MG/ML IV SOLN
100.00 mL | Freq: Once | INTRAVENOUS | Status: AC | PRN
Start: 2019-01-02 — End: 2019-01-02
  Administered 2019-01-02: 100 mL via INTRAVENOUS

## 2019-01-02 MED ORDER — ASPIRIN 81 MG PO CHEW
81.00 mg | CHEWABLE_TABLET | Freq: Every day | ORAL | Status: DC
Start: 2019-01-02 — End: 2019-01-11
  Administered 2019-01-02 – 2019-01-11 (×10): 81 mg via ORAL
  Filled 2019-01-02 (×10): qty 1

## 2019-01-02 MED ORDER — ALBUTEROL-IPRATROPIUM 2.5-0.5 (3) MG/3ML IN SOLN
3.00 mL | RESPIRATORY_TRACT | Status: DC | PRN
Start: 2019-01-02 — End: 2019-01-02
  Administered 2019-01-02: 04:00:00 3 mL via RESPIRATORY_TRACT
  Filled 2019-01-02: qty 3

## 2019-01-02 MED ORDER — ALBUTEROL-IPRATROPIUM 2.5-0.5 (3) MG/3ML IN SOLN
3.00 mL | RESPIRATORY_TRACT | Status: DC
Start: 2019-01-02 — End: 2019-01-11
  Administered 2019-01-02 – 2019-01-10 (×42): 3 mL via RESPIRATORY_TRACT
  Filled 2019-01-02 (×3): qty 3
  Filled 2019-01-02: qty 9
  Filled 2019-01-02 (×35): qty 3

## 2019-01-02 MED ORDER — GLUCOSE 40 % PO GEL
15.00 g | ORAL | Status: DC | PRN
Start: 2019-01-02 — End: 2019-01-11

## 2019-01-02 MED ORDER — AZITHROMYCIN 250 MG PO TABS
500.0000 mg | ORAL_TABLET | ORAL | Status: DC
Start: 2019-01-02 — End: 2019-01-02
  Administered 2019-01-02: 12:00:00 500 mg via ORAL
  Filled 2019-01-02: qty 2

## 2019-01-02 MED ORDER — METHYLPREDNISOLONE SODIUM SUCC 40 MG IJ SOLR
40.00 mg | Freq: Three times a day (TID) | INTRAMUSCULAR | Status: DC
Start: 2019-01-02 — End: 2019-01-05
  Administered 2019-01-02 – 2019-01-05 (×10): 40 mg via INTRAVENOUS
  Filled 2019-01-02 (×10): qty 1

## 2019-01-02 MED ORDER — MEROPENEM 500 MG IV SOLR
500.00 mg | Freq: Four times a day (QID) | INTRAVENOUS | Status: DC
Start: 2019-01-02 — End: 2019-01-02

## 2019-01-02 MED ORDER — GUAIFENESIN-DM 100-10 MG/5ML PO SYRP
5.00 mL | ORAL_SOLUTION | Freq: Four times a day (QID) | ORAL | Status: DC | PRN
Start: 2019-01-02 — End: 2019-01-11
  Administered 2019-01-02 – 2019-01-08 (×7): 5 mL via ORAL
  Filled 2019-01-02 (×7): qty 5

## 2019-01-02 MED ORDER — OXYBUTYNIN CHLORIDE 5 MG PO TABS
5.0000 mg | ORAL_TABLET | Freq: Three times a day (TID) | ORAL | Status: DC
Start: 2019-01-02 — End: 2019-01-11
  Administered 2019-01-02 – 2019-01-11 (×28): 5 mg via ORAL
  Filled 2019-01-02 (×27): qty 1

## 2019-01-02 MED ORDER — LEVOFLOXACIN IN D5W 750 MG/150ML IV SOLN
750.00 mg | INTRAVENOUS | Status: DC
Start: 2019-01-02 — End: 2019-01-02

## 2019-01-02 MED ORDER — ACETAMINOPHEN 650 MG RE SUPP
650.00 mg | RECTAL | Status: DC | PRN
Start: 2019-01-02 — End: 2019-01-11

## 2019-01-02 NOTE — ED Notes (Signed)
Called [address] 25 RN upstairs, she is aware of lactic acid redraw.

## 2019-01-02 NOTE — Consults (Addendum)
Pulmonary Critical Care CONSULTATION    Date Time: 01/02/19 5:47 PM  Patient Name: [name]  Requesting Physician: Adrienne Mocha, MD      Assesment:   Extensive multilobar pneumonia  COPD  OSA wears cpap at home  CAD hx MI  Anxiety  Hyperglycemia, steroid induced?  Plan:   Will change antibiotics to meropenem, check procalcitonin to guide length of therapy.   COPD, continue DuoNeb and steroid, add Breo  Patient unable to stay awake, will check ABG, wears cpap at home, will order bipap  Continue steroids and nebulizers  Continue asa  Will place on low correctional as steroids started and may increase blood sugar    Discussed with Dr [name]    History:   [name] is a 48 y.o. female who presents to the hospital on 01/01/2019 with SOB (shortness of breath) [R06.02]  History of COPD [Z87.09] with cough and shortness of breath for 2 days prior to arriving.  She had pneumonia diagnosed at [facility] 3 months ago.  She is current smoker only quit 2 days ago.   Patient has a pet, dog, no birds or cats.  She is unable to stay awake during interview so ROS unable to obtain.        Past Medical History:     Past Medical History:   Diagnosis Date    Anxiety     Chronic obstructive pulmonary disease     Myocardial infarction     Panic attacks        Past Surgical History:     Past Surgical History:   Procedure Laterality Date    APPENDECTOMY      ARTHROSCOPIC KNEE, ACL RECONSTRUCTION      R. knee     CHOLECYSTECTOMY         Family History:     Family History   Problem Relation Age of Onset    Heart attack Father     Diabetes Father     Asthma Sister     Diabetes Maternal Grandmother     Diabetes Paternal Grandmother     Heart disease Paternal Grandmother        Social History:     Social History     Socioeconomic History    Marital status: Single     Spouse name: Not on file    Number of children: Not on file    Years of education: Not on file    Highest education level: Not on file    Occupational History    Not on file   Social Needs    Financial resource strain: Not on file    Food insecurity:     Worry: Not on file     Inability: Not on file    Transportation needs:     Medical: Not on file     Non-medical: Not on file   Tobacco Use    Smoking status: Former Smoker     Packs/day: 1.00     Years: 25.00     Pack years: 25.00     Types: Cigarettes     Last attempt to quit: 12/29/2018     Years since quitting: 0.0    Smokeless tobacco: Never Used   Substance and Sexual Activity    Alcohol use: Never     Frequency: Never    Drug use: Never    Sexual activity: Not on file   Lifestyle    Physical activity:  Days per week: Not on file     Minutes per session: Not on file    Stress: Not on file   Relationships    Social connections:     Talks on phone: Not on file     Gets together: Not on file     Attends religious service: Not on file     Active member of club or organization: Not on file     Attends meetings of clubs or organizations: Not on file     Relationship status: Not on file    Intimate partner violence:     Fear of current or ex partner: Not on file     Emotionally abused: Not on file     Physically abused: Not on file     Forced sexual activity: Not on file   Other Topics Concern    Not on file   Social History Narrative    Not on file       Allergies:     Allergies   Allergen Reactions    Iodine Solution [Povidone Iodine]      Contrast dye        Hospital Medications:     Current Facility-Administered Medications   Medication Dose Route Frequency    albuterol-ipratropium  3 mL Nebulization Q4H SCH    aspirin  81 mg Oral Daily    cefTRIAXone  1 g Intravenous Q24H    And    azithromycin  500 mg Oral Q24H    enoxaparin  0.5 mg/kg Subcutaneous Daily    gabapentin  800 mg Oral TID    guaiFENesin  600 mg Oral Q12H SCH    methylPREDNISolone  40 mg Intravenous Q8H    nicotine  1 patch Transdermal Daily    oxybutynin  5 mg Oral TID    pantoprazole  40 mg Oral QAM AC     risperiDONE  3 mg Oral BID    sertraline  100 mg Oral BID       Home Medications:     Medications Prior to Admission   Medication Sig Dispense Refill Last Dose    ALPRAZolam (XANAX) 0.5 MG tablet Take 0.5 mg by mouth 2 (two) times daily as needed          atomoxetine (STRATTERA) 10 MG capsule Take 80 mg by mouth daily          gabapentin (NEURONTIN) 800 MG tablet Take 800 mg by mouth 3 (three) times daily       indomethacin (INDOCIN) 25 MG capsule Take 50 mg by mouth 3 (three) times daily with meals       oxybutynin (DITROPAN) 5 MG tablet Take 5 mg by mouth 3 (three) times daily       oxyCODONE-acetaminophen (PERCOCET) 10-325 MG per tablet Take 1 tablet by mouth every 4 (four) hours as needed for Pain       risperiDONE (RISPERDAL) 3 MG tablet Take 3 mg by mouth 2 (two) times daily       sertraline (ZOLOFT) 100 MG tablet Take 100 mg by mouth 2 (two) times daily             Review of Systems:   Unable to obtain    Physical Exam:   BP 111/71    Pulse 97    Temp 99.2 F (37.3 C) (Axillary)    Resp 19    Ht 1.753 m (_0 )    Wt 138.8 kg (306 lb)  SpO2 93%    BMI 45.19 kg/m     Intake and Output Summary (Last 24 hours) at Date Time    Intake/Output Summary (Last 24 hours) at 01/02/2019 1747  Last data filed at 01/02/2019 0900  Gross per 24 hour   Intake 240 ml   Output    Net 240 ml       General appearance - Mental status - somnonlent to lethargic, needs to constantly be aroused to awake, limited ability to answer questions  Eyes - pupils equal and reactive  Ears - no external ear lesions seen  Nose - no nasal discharge   Mouth - clear oral mucosa, moist   Neck - supple, no JVD  Chest - crackles faint, with occasional wheezing, on nasal canula sats reported 94%  Heart - normal rate, regular rhythm, normal S1, S2, no murmurs, rubs, clicks or gallops  Abdomen - soft, nontender, nondistended, no masses or organomegaly  Neurological -moves all extremities, increased stimulation and can follow commands   Musculoskeletal - no joint swelling or tenderness  Extremities -  no pedal edema, no clubbing or cyanosis  Skin - normal coloration, no rashes    Labs Reviewed:     Results     Procedure Component Value Units Date/Time    Legionella antigen, urine [[id number]] Collected:  01/02/19 0120    Specimen:  Urine, Clean Catch Updated:  01/02/19 1146    Narrative:       ORDER#: [id number]                                    ORDERED BY: Gloris Manchester  SOURCE: Urine, Clean Catch                           COLLECTED:  01/02/19 01:20  ANTIBIOTICS AT COLL.:                                RECEIVED :  01/02/19 04:11  Legionella, Rapid Urinary Antigen          FINAL       01/02/19 11:46  01/02/19   Negative for Legionella pneumophila Serogroup 1 Antigen             Limitations of Test:             1. Negative results do not exclude infection with Legionella                pneumophila Serogroup 1.             2. Does not detect other serogroups of L. pneumophila                or other Legionella species.             Test Reference Range: Negative      Beta HCG, Quant, Serum [[id number]] Collected:  01/02/19 0839     Updated:  01/02/19 1052     hCG, Quant. <1.2    Troponin I [[id number]] Collected:  01/02/19 0839    Specimen:  Blood Updated:  01/02/19 0916     Troponin I <0.01 ng/mL     D-Dimer [[id number]]  (Abnormal) Collected:  01/02/19 0839     Updated:  01/02/19 0904     D-Dimer 1.04 ug/mL FEU  Hemoglobin A1C [[id number]] Collected:  01/02/19 0506    Specimen:  Blood Updated:  01/02/19 0700     Hemoglobin A1C 5.1 %      Average Estimated Glucose 99.7 mg/dL     Narrative:       This is NOT the correct Test for Patients with  Hemoglobinopathy.    Respiratory Pathogen Panel, PCR (Film Array) [[id number]] Collected:  01/02/19 0104     Updated:  01/02/19 0644     Adenovirus Not Detected     Coronavirus 229E Not Detected     Coronavirus HKU1 Not Detected     Coronavirus NL63 Not Detected     Coronavirus OC43 Not Detected     Human  Metapneumovirus Not Detected     Human Rhinovirus/Enterovirus Not Detected     Influenza A Not Detected     Influenza A/H1 Not Detected     Influenza AH1 - 2009 Not Detected     Influenza A/H3 Not Detected     Influenza B Not Detected     Parainfluenza Virus 1 Not Detected     Parainfluenza Virus 2 Not Detected     Parainfluenza Virus 3 Not Detected     Parainfluenza Virus 4 Not Detected     Respiratory Syncytial Virus Not Detected     Bordetella pertussis Not Detected     Chlamydophila pneumoniae Not Detected     Mycoplasma pneumoniae Not Detected     Source NP Swab    CRP, High Sensitive [[id number]]  (Abnormal) Collected:  01/02/19 0506     Updated:  01/02/19 0557     C-Reactive Protein, High Sensitive 17.20 mg/dL     Narrative:       This is NOT the correct Test for Patients with  Hemoglobinopathy.    Troponin I [[id number]] Collected:  01/02/19 0506    Specimen:  Blood Updated:  01/02/19 0556     Troponin I <0.01 ng/mL     Narrative:       This is NOT the correct Test for Patients with  Hemoglobinopathy.    Procalcitonin [[id number]] Collected:  01/02/19 0103     Updated:  01/02/19 0429     Procalcitonin 0.03    Lactic acid, plasma [[id number]] Collected:  01/02/19 0232    Specimen:  Blood Updated:  01/02/19 0246     Lactic acid 1.5 mmol/L     Basic Metabolic Panel [[id number]]  (Abnormal) Collected:  01/02/19 0103    Specimen:  Blood Updated:  01/02/19 0140     Glucose 155 mg/dL      BUN 9.0 mg/dL      Creatinine 0.9 mg/dL      Calcium 9.3 mg/dL      Sodium 137 mEq/L      Potassium 4.3 mEq/L      Chloride 110 mEq/L      CO2 17 mEq/L      Anion Gap 10.0    Hemolysis index [[id number]] Collected:  01/02/19 0103     Updated:  01/02/19 0140     Hemolysis Index 5    GFR [[id number]] Collected:  01/02/19 0103     Updated:  01/02/19 0140     EGFR >60.0    Rapid influenza A/B antigens [[id number]] Collected:  01/02/19 0104    Specimen:  Nasopharyngeal from Nasal Aspirate Updated:  01/02/19 0138    Narrative:       ORDER#:  [id number]  ORDERED BY: Gloris Manchester  SOURCE: Nasal Aspirate                               COLLECTED:  01/02/19 01:04  ANTIBIOTICS AT COLL.:                                RECEIVED :  01/02/19 01:19  Influenza Rapid Antigen A&B                FINAL       01/02/19 01:38  01/02/19   Negative for Influenza A and B             Reference Range: Negative      CBC without differential [[id number]]  (Abnormal) Collected:  01/02/19 0103    Specimen:  Blood Updated:  01/02/19 0124     WBC 11.35 x10 3/uL      Hgb 12.5 g/dL      Hematocrit 39.4 %      Platelets 263 x10 3/uL      RBC 4.31 x10 6/uL      MCV 91.4 fL      MCH 29.0 pg      MCHC 31.7 g/dL      RDW 14 %      MPV 10.7 fL      Nucleated RBC 0.0 /100 WBC      Absolute NRBC 0.00 x10 3/uL     B-type Natriuretic Peptide [[id number]] Collected:  01/01/19 2116    Specimen:  Blood Updated:  01/01/19 2231     B-Natriuretic Peptide 83 pg/mL     Troponin I [[id number]] Collected:  01/01/19 2116    Specimen:  Blood Updated:  01/01/19 2217     Troponin I <0.01 ng/mL     Comprehensive metabolic panel [[id number]]  (Abnormal) Collected:  01/01/19 2116    Specimen:  Blood Updated:  01/01/19 2210     Glucose 109 mg/dL      BUN 8.0 mg/dL      Creatinine 0.9 mg/dL      Sodium 140 mEq/L      Potassium 4.5 mEq/L      Chloride 112 mEq/L      CO2 19 mEq/L      Calcium 9.4 mg/dL      Protein, Total 5.4 g/dL      Albumin 2.7 g/dL      AST (SGOT) 18 U/L      ALT 9 U/L      Alkaline Phosphatase 80 U/L      Bilirubin, Total 0.2 mg/dL      Globulin 2.7 g/dL      Albumin/Globulin Ratio 1.0     Anion Gap 9.0    Hemolysis index [[id number]] Collected:  01/01/19 2116     Updated:  01/01/19 2210     Hemolysis Index 12    GFR [[id number]] Collected:  01/01/19 2116     Updated:  01/01/19 2210     EGFR >60.0    CBC and differential [[id number]]  (Abnormal) Collected:  01/01/19 2116    Specimen:  Blood Updated:  01/01/19 2147     WBC 12.59 x10 3/uL      Hgb 12.0 g/dL       Hematocrit 38.0 %      Platelets 248 x10 3/uL      RBC 4.15 x10 6/uL  MCV 91.6 fL      MCH 28.9 pg      MCHC 31.6 g/dL      RDW 14 %      MPV 11.0 fL      Neutrophils 76.4 %      Lymphocytes Automated 11.4 %      Monocytes 3.8 %      Eosinophils Automated 7.5 %      Basophils Automated 0.6 %      Immature Granulocyte 0.3 %      Nucleated RBC 0.0 /100 WBC      Neutrophils Absolute 9.61 x10 3/uL      Abs Lymph Automated 1.43 x10 3/uL      Abs Mono Automated 0.48 x10 3/uL      Abs Eos Automated 0.95 x10 3/uL      Absolute Baso Automated 0.08 x10 3/uL      Absolute Immature Granulocyte 0.04 x10 3/uL      Absolute NRBC 0.00 x10 3/uL             Rads:   Radiological Procedure reviewed.   Radiology Results (24 Hour)     Procedure Component Value Units Date/Time    CT Angiogram Chest [[id number]] Collected:  01/02/19 1507    Order Status:  Completed Updated:  01/02/19 1523    Narrative:       CT ANGIOGRAM CHEST     CLINICAL HISTORY: PE suspected, intermediate prob, positive D-dimer    COMPARISON: None.    TECHNIQUE: Helical CT is performed from the thoracic inlet to the upper  abdomen after administration of 100 mL Omnipaque intravenous contrast. A  second scan was attempted for better imaging, 4 minutes after the  initial attempt. Coronal and sagittal MIP reconstructions were  performed.     Note: Note that CT scanning at this site  utilizes multiple dose  reduction techniques including automatic exposure control, adjustment of  the MAS and/or KVP according to patient's size and use of iterative  reconstruction technique    FINDINGS: This is a limited exam due to technique, motion artifact and  bolus timing. No saddle embolus identified. No filling defects in the  main segmental arteries. The distal arteries are not well evaluated.    Degenerative and/or posttraumatic changes of the right glenohumeral  joint. No aggressive lesions or acute fractures identified.    Visualized thyroid gland appears normal. No enlarged  axillary,  mediastinal or hilar lymphadenopathy. Heart size is mildly enlarged.  There is difficulty measuring the great vessels due to motion artifact  although the appear normal in caliber.    The esophagus is normal. Limited visualization of the upper abdomen  reveals colonic diverticulosis and surgical clips in the gallbladder  fossa.    No filling defects seen within the airways. No effusion. There are  diffuse mosaic attenuation and air trapping worse in the mid to upper  lung zones.      Impression:          1. This is a limited study due to multiple factors including patient  motion and suboptimal bolus timing. No large central filling defect  within the pulmonary arteries.    2. There is mosaic attenuation and air trapping worsened mid to upper  lobe distribution. While this is a nonspecific finding but can be  associated with many disease entities and etiologies, it can be seen  with occlusive vascular disease such as chronic thromboembolic disease.    Jeanella Cara, MD  01/02/2019 3:19 PM    Chest 2 Views [[id number]] Collected:  01/01/19 2200    Order Status:  Completed Updated:  01/01/19 2205    Narrative:       XR CHEST 2 VIEWS    CLINICAL INDICATION:   sob    COMPARISON: None    FINDINGS:  The study is limited secondary to underpenetrated technique.  The cardiac silhouette appears enlarged. Mediastinal silhouette appears  within normal size limits. The lungs demonstrate suboptimal inspiration  with crowding of bronchovascular structures. Patchy bilateral perihilar  opacities are nonspecific. There is no evidence for pleural effusion or  pneumothorax.        Impression:        Patchy bilateral perihilar opacities are nonspecific.  Atypical infection cannot be excluded. Clinical correlation and  continued follow-up is recommended.     [name], MD   01/01/2019 10:01 PM              Signed by: Paulla Fore

## 2019-01-02 NOTE — ED Notes (Addendum)
[facility] ED NURSING NOTE FOR THE RECEIVING INPATIENT NURSE  -Update-      The following information is an update to the previous [facility] ED Nursing Admission Note:    Lab called and said lactic acid clotted, please redraw, pt was already sent upstairs. Was in room with conscious sedation when lab called.

## 2019-01-02 NOTE — H&P (Signed)
[facility]      Patient: [name]  Date: 01/01/2019   DOB: Jul 15, 1971  Admission Date: 01/01/2019   MRN: [id number]  Attending: Baruch Gouty         Chief Complaint   Patient presents with    Shortness of Breath      History Gathered From: Patient    HISTORY AND PHYSICAL     [name] is a 48 y.o. female with a history of COPD, CAD/MI, and anxiety presenting with shortness of breath.  Patient states that on Monday she began to feel a sensation of chest pressure which was initially mild but progressively worsened.  She describes currently having worsening chest pressure localized centrally that feels like 1000 pound weight on her chest.  Symptoms got worse this morning and progressed over the course of the day prompting her to come in this evening.  She complains of shortness of breath associated with cough without sputum production.  She endorses chest wall pain that is worsened with coughing.  She denies abdominal pain, nausea, vomiting, lower urinary tract symptoms, or focal neurologic deficits..    Past Medical History:   Diagnosis Date    Anxiety     Chronic obstructive pulmonary disease     Myocardial infarction     Panic attacks        Past Surgical History:   Procedure Laterality Date    APPENDECTOMY      ARTHROSCOPIC KNEE, ACL RECONSTRUCTION      R. knee     CHOLECYSTECTOMY         Prior to Admission medications    Medication Sig Start Date End Date Taking? Authorizing Provider   ALPRAZolam (XANAX) 0.5 MG tablet Take 0.5 mg by mouth 2 (two) times daily as needed      Yes [provider]   atomoxetine (STRATTERA) 10 MG capsule Take 80 mg by mouth daily      Yes [provider]   gabapentin (NEURONTIN) 800 MG tablet Take 800 mg by mouth 3 (three) times daily   Yes [provider]   indomethacin (INDOCIN) 25 MG capsule Take 50 mg by mouth 3 (three) times daily with meals   Yes [provider]   oxybutynin (DITROPAN) 5 MG tablet Take 5 mg  by mouth 3 (three) times daily   Yes [provider]   oxyCODONE-acetaminophen (PERCOCET) 10-325 MG per tablet Take 1 tablet by mouth every 4 (four) hours as needed for Pain   Yes [provider]   risperiDONE (RISPERDAL) 3 MG tablet Take 3 mg by mouth 2 (two) times daily   Yes [provider]   sertraline (ZOLOFT) 100 MG tablet Take 100 mg by mouth 2 (two) times daily      Yes [provider]       Allergies   Allergen Reactions    Iodine Solution [Povidone Iodine]      Contrast dye        PRIMARY CARE MD: [name], MD    Family History   Problem Relation Age of Onset    Heart attack Father     Diabetes Father     Asthma Sister     Diabetes Maternal Grandmother     Diabetes Paternal Grandmother     Heart disease Paternal Grandmother          Social History     Tobacco Use    Smoking status: Former Smoker  Packs/day: 1.00     Years: 25.00     Pack years: 25.00     Types: Cigarettes     Last attempt to quit: 12/29/2018     Years since quitting: 0.0    Smokeless tobacco: Never Used   Substance Use Topics    Alcohol use: Never     Frequency: Never    Drug use: Never       REVIEW OF SYSTEMS   Pertinent positives are noted in HPI. Review of Systems completed and all other systems were reviewed and are negative.     PHYSICAL EXAM   Vital Signs (most recent): BP 118/61    Pulse 79    Temp 97.8 F (36.6 C) (Oral)    Resp 22    Ht 1.753 m (_0 )    Wt 138.8 kg (306 lb)    SpO2 95%    BMI 45.19 kg/m     Constitutional: Distressed.    HEENT: NC/AT, PERRL, no scleral icterus or conjunctival pallor, MMM, oropharynx without erythema or exudate  Neck: supple, no cervical or supraclavicular lymphadenopathy or masses  Cardiovascular: Mild tachycardia, regular rhythm, normal S1 S2, no murmurs, gallops, no JVD. No edema.  Respiratory: Moderate increased work of breathing.  Diffuse bilateral wheezing.    Gastrointestinal: +BS, non-distended, soft, non-tender, no rebound or  guarding, no hepatosplenomegaly  Genitourinary: no suprapubic or costovertebral angle tenderness  Musculoskeletal: ROM and motor strength grossly normal. No clubbing or cyanosis. DP and radial pulses 2+ and symmetric.  Skin: warm and perfused. No rash or lesions.  Neurologic: AAOx3, EOMI, CN 2-12 grossly intact. no gross motor or sensory deficits  Psychiatric: Normal mood and affect. The patient is alert, interactive, appropriate.    Lines/Drains/Airways:  Patient Lines/Drains/Airways Status    Active Lines, Drains and Airways     Name:   Placement date:   Placement time:   Site:   Days:    Peripheral IV 01/02/19 Right;Posterior;Lateral Hand   01/02/19    0257    Hand   less than 1                Exam done by Baruch Gouty, MD on 01/02/19 at 12:43 AM    LABS & IMAGING     Recent Labs     01/02/19  0103 01/01/19  2116   WBC 11.35* 12.59*   Hgb 12.5 12.0   Hematocrit 39.4 38.0   Platelets 263 248     No results for input(s): PT, INR, PTT in the last 72 hours. Recent Labs   Lab 01/02/19  0103 01/01/19  2116   Sodium 137 140   Potassium 4.3 4.5   Chloride 110 112*   CO2 17* 19*   BUN 9.0 8.0   Creatinine 0.9 0.9   Calcium 9.3 9.4   Albumin  --  2.7*   Protein, Total  --  5.4*   Bilirubin, Total  --  0.2   Alkaline Phosphatase  --  80   ALT  --  9   AST (SGOT)  --  18   Glucose 155* 109*   B-Natriuretic Peptide  --  83             Microbiology:   Microbiology Results     None          Imaging:  Chest 2 Views    Result Date: 01/01/2019   Patchy bilateral perihilar opacities are nonspecific. Atypical infection cannot be excluded.  Clinical correlation and continued follow-up is recommended. [name], MD 01/01/2019 10:01 PM         ASSESSMENT & PLAN     [name] is a 48 y.o. female with a history of COPD, CAD/MI, and anxiety presenting with acute COPD exacerbation with overlapping viral upper respiratory infection    #Acute COPD exacerbation  #Viral URI  Continue DuoNeb every 4 hours standing  Continue  Solu-Medrol  Continue empiric antibiotics with Levaquin  Continue Breo Ellipta  Give supplemental oxygen as needed  Give Robitussin and Tessalon Perles for cough as needed  Continue to monitor on telemetry  Check procalcitonin  Check respiratory viral panel and flu    #Chest pressure   #CAD  Continue aspirin  Give sublingual nitroglycerin for chest pain as needed  Continue to trend troponins, rule out ACS  Risk stratification with hemoglobin A1c, lipid panel, high sensitivity CRP  Continue to monitor on telemetry  Repeat EKG as indicated    #Anxiety  Continue Resporal  Continue Zoloft  Continue Neurontin    #Overactive bladder  Continue oxybutynin    # Nutrition  Regular diet    # VTE Prophylaxis  Lovenox    # CODE STATUS: Full Code    Anticipated medical stability for discharge: 2 to 3 days    Service status/Reason for ongoing hospitalization: COPD exacerbation  Anticipated Discharge Needs: none    Signed,  Baruch Gouty    01/02/2019 4:28 AM  Time Elapsed:  45 mins

## 2019-01-02 NOTE — Progress Notes (Signed)
[facility]  PROGRESS NOTE      Patient: [name]  Date: 01/02/2019   LOS: 0 Days  Admission Date: 01/01/2019   MRN: [id number]  Attending: Adrienne Mocha  Please contact me on Perfect[employer]            SUBJECTIVE     Patient seen and examined at bedside this morning with RN. Reports nonproductive cough and shortness of breath.   Denies sick contacts, recent travel. Reports chest discomfort only with coughing.   States she is not having actual chest pain. Updated on plan of care.     Denies abdominal pain, nausea, vomiting, diarrhea.        OBJECTIVE     Vitals:    01/02/19 1708   BP: 111/71   Pulse: 97   Resp: 19   Temp: 99.2 F (37.3 C)   SpO2: 93%       Temperature: Temp  Min: 96.8 F (36 C)  Max: 99.2 F (37.3 C)  Pulse: Pulse  Min: 68  Max: 97  Respiratory: Resp  Min: 17  Max: 28  Non-Invasive BP: BP  Min: 105/52  Max: 119/51  Pulse Oximetry SpO2  Min: 89 %  Max: 98 %    Intake and Output Summary (Last 24 hours) at Date Time    Intake/Output Summary (Last 24 hours) at 01/02/2019 1820  Last data filed at 01/02/2019 0900  Gross per 24 hour   Intake 240 ml   Output    Net 240 ml       PHYSICAL EXAMINATION  GEN APPEARANCE: ill-appearing but in NAD. Alert and cooperative.   Frequently coughing.   HEENT: NCAT, EOMI, PERRL, no nasal discharge.   conjunctivae/corneas clear. Oral mucosa moist.   Neck: Supple without meningismus   CVS: RRR, S1, S2. No murmur   LUNGS: coarse breath sounds with diffuse expiratory wheezes. On O2.   ABD: BS+, soft, NT, ND, no guarding or rigidity  EXT: No edema; Pulses 2+ and intact  NEURO: AAOx3, CN 2-12 intact.  No focal neurological deficits  MENTAL STATUS: anxious otherwise appropriate affect and mood      MEDICATIONS     Current Facility-Administered Medications   Medication Dose Route Frequency    albuterol-ipratropium  3 mL Nebulization Q4H SCH    aspirin  81 mg Oral Daily    enoxaparin  0.5 mg/kg Subcutaneous Daily    gabapentin  800 mg Oral TID    guaiFENesin  600 mg  Oral Q12H [place]    insulin lispro  1-3 Units Subcutaneous QHS    insulin lispro  1-5 Units Subcutaneous TID AC    meropenem  500 mg Intravenous Q6H    methylPREDNISolone  40 mg Intravenous Q8H    nicotine  1 patch Transdermal Daily    oxybutynin  5 mg Oral TID    pantoprazole  40 mg Oral QAM AC    risperiDONE  3 mg Oral BID    sertraline  100 mg Oral BID            LABS/IMAGING     Recent Labs   Lab 01/02/19  0103 01/01/19  2116   WBC 11.35* 12.59*   RBC 4.31 4.15   Hgb 12.5 12.0   Hematocrit 39.4 38.0   MCV 91.4 91.6   Platelets 263 248       Recent Labs   Lab 01/02/19  0103 01/01/19  2116   Sodium 137 140   Potassium 4.3  4.5   Chloride 110 112*   CO2 17* 19*   BUN 9.0 8.0   Creatinine 0.9 0.9   Glucose 155* 109*   Calcium 9.3 9.4       Recent Labs   Lab 01/01/19  2116   ALT 9   AST (SGOT) 18   Bilirubin, Total 0.2   Albumin 2.7*   Alkaline Phosphatase 80       Recent Labs   Lab 01/02/19  0839 01/02/19  0506 01/01/19  2116   Troponin I <0.01 <0.01 <0.01             Microbiology Results     Procedure Component Value Units Date/Time    Legionella antigen, urine [[id number]] Collected:  01/02/19 0120    Specimen:  Urine, Clean Catch Updated:  01/02/19 1146    Narrative:       ORDER#: [id number]                                    ORDERED BY: Gloris Manchester  SOURCE: Urine, Clean Catch                           COLLECTED:  01/02/19 01:20  ANTIBIOTICS AT COLL.:                                RECEIVED :  01/02/19 04:11  Legionella, Rapid Urinary Antigen          FINAL       01/02/19 11:46  01/02/19   Negative for Legionella pneumophila Serogroup 1 Antigen             Limitations of Test:             1. Negative results do not exclude infection with Legionella                pneumophila Serogroup 1.             2. Does not detect other serogroups of L. pneumophila                or other Legionella species.             Test Reference Range: Negative      Rapid influenza A/B antigens [[id number]] Collected:  01/02/19  0104    Specimen:  Nasopharyngeal from Nasal Aspirate Updated:  01/02/19 0138    Narrative:       ORDER#: [id number]                                    ORDERED BY: Gloris Manchester  SOURCE: Nasal Aspirate                               COLLECTED:  01/02/19 01:04  ANTIBIOTICS AT COLL.:                                RECEIVED :  01/02/19 01:19  Influenza Rapid Antigen A&B                FINAL       01/02/19 01:38  01/02/19  Negative for Influenza A and B             Reference Range: Negative      Respiratory Pathogen Panel, PCR (Film Array) [[id number]] Collected:  01/02/19 0104     Updated:  01/02/19 0644     Adenovirus Not Detected     Coronavirus 229E Not Detected     Coronavirus HKU1 Not Detected     Coronavirus NL63 Not Detected     Coronavirus OC43 Not Detected     Human Metapneumovirus Not Detected     Human Rhinovirus/Enterovirus Not Detected     Influenza A Not Detected     Influenza A/H1 Not Detected     Influenza AH1 - 2009 Not Detected     Influenza A/H3 Not Detected     Influenza B Not Detected     Parainfluenza Virus 1 Not Detected     Parainfluenza Virus 2 Not Detected     Parainfluenza Virus 3 Not Detected     Parainfluenza Virus 4 Not Detected     Respiratory Syncytial Virus Not Detected     Bordetella pertussis Not Detected     Chlamydophila pneumoniae Not Detected     Mycoplasma pneumoniae Not Detected     Comment: Multiplex nucleic acid amplification assay for detection of  18 respiratory viruses and bacteria. This assay cannot  differentiate Rhinovirus/Enterovirus. If necessary for  patient care, a positive result for Rhinovirus/Enterovirus  may be followed-up using an alternate method.  Viral and bacterial nucleic acids may persist even though no  viable organism is present. Detection of nucleic acid does  not imply that the corresponding organisms are infectious or  are the causative agents of clinical symptoms. A negative  result does not exclude the possibility of viral or  bacterial infection.  Performance characteristics may vary  with circulating strains. The assay may not be able to  distinguish between existing viral strains and new variants  as they emerge.The performance of this test has not been  established in individuals who received influenza vaccine.  Recent administration of a nasal influenza vaccine may cause  false positive results for Influenza A and/or Influenza B.  This assay is FDA cleared for nasopharyngeal swab samples.  Performance characteristics for Bronchoalveolar lavage  samples have been determined by the [facility] laboratory. Other  sample types are unacceptable.          Source NP Swab             Chest 2 Views    Result Date: 01/01/2019   Patchy bilateral perihilar opacities are nonspecific. Atypical infection cannot be excluded. Clinical correlation and continued follow-up is recommended. [name], MD 01/01/2019 10:01 PM    Ct Angiogram Chest    Result Date: 01/02/2019   1. This is a limited study due to multiple factors including patient motion and suboptimal bolus timing. No large central filling defect within the pulmonary arteries. 2. There is mosaic attenuation and air trapping worsened mid to upper lobe distribution. While this is a nonspecific finding but can be associated with many disease entities and etiologies, it can be seen with occlusive vascular disease such as chronic thromboembolic disease. Jeanella Cara, MD 01/02/2019 3:19 PM         ASSESSMENT/PLAN   Patient Active Hospital Problem List:   Multilobar pneumonia    Acute COPD exacerbation   OSA on CPAP qhs    History of CAD    Anxiety  [name] is a 48 y.o. female with a history of COPD, active tobacco use, CAD/MI, and anxiety presenting with shortness of breath. She is admitted for pneumonia and acute COPD exacerbation.       #Multilobar Pneumonia   #Acute COPD exacerbation  -initially started on ceftriaxone and azithromycin.   -lactic acid and procalcitonin normal.  Legionella, influenza,  respiratory pathogen panel negative.  -elevated d-dimer, CTA chest negative for PE but shows extensive multilobar pneumonia.  -Pulmonology consulted. she was hospitalized for pneumonia 3 months ago.  abx broadened to meropenem.    -ID consulted   -iv solumedrol 68m q8h   -continue supplemental oxygen   -scheduled duonebs   -mucinex, incentive spirometry  -follow cultures   -monitor on telemetry          #CAD  -EKG shows NSR, nonspecific T wave changes. Serial troponins x 3 negative.   -continue aspirin  -continue to monitor on telemetry    #Tobacco use  -advised cessation. Nicotine patch.     #OSA   -on cpap qhs        #Anxiety  -continue Resporal Zoloft and Neurontin    #Overactive bladder  -continue oxybutynin      Code: FULL   DVT prophylaxis: lovenox  Disposition: continues to require inpatient hospitalization. Change inpatient status for further management as above.           Signed,  FAdrienne Mocha          This chart was generated using hospital voice-recognition software which does not employ spell-checking or grammar-checking features. It was dictated, all or in part, in a busy and often noisy patient care environment. I have taken all usual measures to dictate carefully and to review all aspects this chart. Nonetheless, given the known and well-documented performance characteristics of VR software in such patient care environments, this dictation still may contain unrecognized and wholly unintended errors or omissions

## 2019-01-02 NOTE — UM Notes (Signed)
[facility] Utilization Review  NPI: [id number], Tax ID: [id number]  Please Call Janalyn Rouse @ [phone] with any questions or concerns. Confidential voicemail.  Email: [email or web address]   Fax final authorizations and requests for additional information to [phone]    01/01/19 2246 Place in Observation Services     01/01/19 Vitals: 98.1, 86, 95%, 24, 118/66    01/02/19 Medicine Assessment  48 y.o. female with a history of COPD, CAD/MI, and anxiety presenting with shortness of breath. She describes currently having worsening chest pressure.  Symptoms got worse this morning and progressed over the course of the day prompting her to come in this evening.  She complains of shortness of breath associated with cough without sputum production.      01/01/2019   WBC: 12.59     01/02/2019   WBC: 11.35     01/01/2019   Albumin: 2.7   Protein, Total: 5.4     01/02/2019 C-Reactive Protein, High Sensitive: 17.20     01/02/2019   D-Dimer: 1.04     01/01/19 Chest XR:  IMPRESSION:    Patchy bilateral perihilar opacities are nonspecific.  Atypical infection cannot be excluded. Clinical correlation and  continued follow-up is recommended.     Plan:  #Acute COPD exacerbation  #Viral URI  Continue DuoNeb every 4 hours standing  Continue Solu-Medrol  Continue empiric antibiotics with Levaquin  Continue Breo Ellipta  Give supplemental oxygen as needed  Give Robitussin and Tessalon Perles for cough as needed  Continue to monitor on telemetry  Check procalcitonin  Check respiratory viral panel and flu    #Chest pressure   #CAD  Continue aspirin  Give sublingual nitroglycerin for chest pain as needed  Continue to trend troponins, rule out ACS  Risk stratification with hemoglobin A1c, lipid panel, high sensitivity CRP  Continue to monitor on telemetry  Repeat EKG as indicated       Scheduled Meds:  Current Facility-Administered Medications   Medication Dose Route Frequency    albuterol-ipratropium  3 mL  Nebulization Q4H SCH    aspirin  81 mg Oral Daily    cefTRIAXone  1 g Intravenous Q24H    And    azithromycin  500 mg Oral Q24H    diphenhydrAMINE  50 mg Oral Once    enoxaparin  0.5 mg/kg Subcutaneous Daily    gabapentin  800 mg Oral TID    guaiFENesin  600 mg Oral Q12H SCH    hydrocortisone  200 mg Intravenous Once    indomethacin  50 mg Oral TID MEALS    methylPREDNISolone  40 mg Intravenous Q8H    nicotine  1 patch Transdermal Daily    oxybutynin  5 mg Oral TID    risperiDONE  3 mg Oral BID    sertraline  100 mg Oral BID       levoFLOXacin (LEVAQUIN) 738m in D5W 1578mIVPB (premix)   Dose: 750 mg  Freq: Once Route: IV x 1 dose    oxyCODONE-acetaminophen (PERCOCET) 5-325 MG per tablet 2 tablet   Dose: 2 tablet  Freq: Once Route: PO x 1 dose

## 2019-01-02 NOTE — Plan of Care (Signed)
Admitted this shift for SOB and COPD exacerbation; alert, oriented, NSR, 02 sats >93% @ 3 LPM NC; DOE esp when walking to BR; c/o CP s/t strong coughing; initial trop negative; Flu negative; pt isolated for droplet, resp pathogen panel result pending; on empiric abx.    Problem: Pain  Goal: Pain at adequate level as identified by patient  01/02/2019 2942 by Feliberto Gottron Rycca R, RN  Outcome: Progressing  Flowsheets (Taken 01/02/2019 0551)  Pain at adequate level as identified by patient: Assess pain on admission, during daily assessment and/or before any "as needed" intervention(s);Evaluate if patient comfort function goal is met;Offer non-pharmacological pain management interventions  Note:   Pt c/o chronic back and knee pain and takes Percocet @ home;      Problem: Compromised Hemodynamic Status  Goal: Vital signs and fluid balance maintained/improved  Outcome: Progressing  Note:   DOE; oxygen sats >93% @ 3 LPM NC; some coughing noted w/ occasional CP     Problem: Inadequate Gas Exchange  Goal: Adequate oxygenation and improved ventilation  Outcome: Progressing     Problem: Inadequate Gas Exchange  Goal: Patent Airway maintained  Outcome: Progressing     Problem: Inadequate Airway Clearance  Goal: Normal respiratory rate/effort achieved/maintained  Outcome: Progressing  Note:   DOE, shallow breathing, rhonchi and wheezes all over; neb treatments given; on solumedrol; on oxygen @ 3 LPM     Problem: Impaired Mobility  Goal: Mobility/Activity is maintained at optimal level for patient  Outcome: Progressing  Note:   Can walk to the BR x1 assist voiding well     Problem: Pain interferes with ability to perform ADL  Goal: Pain at adequate level as identified by patient  01/02/2019 6270 by Feliberto Gottron Rycca R, RN  Outcome: Progressing  01/02/2019 0551 by Feliberto Gottron Rycca R, RN  Outcome: Progressing  Flowsheets (Taken 01/02/2019 0551)  Pain at adequate level as identified by patient: Assess pain on admission, during daily  assessment and/or before any "as needed" intervention(s);Evaluate if patient comfort function goal is met;Offer non-pharmacological pain management interventions  Note:   Pt c/o chronic back and knee pain and takes Percocet @ home;

## 2019-01-02 NOTE — Consults (Addendum)
Infectious Diseases and Tropical Medicine Consult  Heywood Footman, MD          Date Time: 01/02/19 6:40 PM  Patient Name: [name]  Referring Physician: Adrienne Mocha, MD      Reason for Consultation:      Pneumonia    Assessment:      Multifocal pneumonia   Possibility of atypical pneumonia or pulmonary fibrosis   History of heavy smoking   COPD exacerbation    Recommendations:      Start cefepime and doxycycline   Mycoplasma and Chlamydia antibodies   Repeat procalcitonin level   Pulmonary following-May need bronchoscopy/pulmonary function tests   Follow-up chest x-ray   Probiotics    Monitor electrolytes and renal functions closely   Monitor clinically                                                                   History of Present Illness:     [name] is a 48 year old female with a history of COPD, heavy smoking, myocardial infarction, generalized anxiety disorder is admitted today because of worsening shortness of breath, nonproductive cough and generalized weakness since last 2 days.  Complains of chest pain secondary to constant coughing.  No nausea vomiting or diarrhea.  Patient was at [facility] 3 months ago.  Patient has a T-max of 99.2, blood pressure 111/71, pulse of 97 bpm, leukocyte count of 12.5, influenza screen is negative, chest x-ray shows patchy bilateral perihilar opacities.  Urinary Legionella antigen is unremarkable.  Procalcitonin level is 0.03.    Past Medical History:      COPD   Myocardial infarction   Generalized anxiety disorder    Past Surgical History:      Appendectomy   Right knee arthroscopy   Cholecystectomy    Family History:      Heart disease, diabetes mellitus-father   Asthma-sister   Diabetes mellitus-maternal grandmother, paternal grandmother    Social History:      25-year pack smoking history   No alcohol    Allergies:      Iodine    Review of Systems:     General:  Comfortable, no acute distress, complains  of subjective fevers  HEENT: No runny nose or sore throat  Respiratory: Complains of dry cough, no shortness of breath, no hematemesis or hemoptysis  Cardiac: no chest pain  Abdomen: no abdominal pain, no nausea vomiting or diarrhea  Neurologic: awake and alert  Extremities: no joint pains or swelling  Genitourinary: No dysuria or hematuria  Dermatologic: no skin rashes, no itching    Physical Exam:     Blood pressure 111/71, pulse 97, temperature 99.2 F (37.3 C), temperature source Axillary, resp. rate 19, height 1.753 m (5' 9"), weight 138.8 kg (306 lb), SpO2 93 %.    General Appearance: Comfortable, well-appearing and in no acute distress.    HEENT:  Head is normocephalic, atraumatic, pupils are equal and reactive to light  Neck:    Supple,No neck lymphadenopathy, no thyromegaly  Lungs: Bilateral scattered crackles, decreased air entry in both lungs  Chest Wall: Symmetric chest wall expansion.   Heart : Regular rate and rhythm, no murmur or gallop  Abdomen: Abdomen is soft, nontender, good bowel sounds, no  hepatosplenomegaly  Neurological: Awake and alert, normal muscle strength, no focal deficit  Extremities: Trace edema, no clubbing or cyanosis  Skin:  Warm and dry.No rash or ecchymosis.   Psychiatric: Mood and affect is normal    Labs:     Recent Labs     01/02/19  0103 01/01/19  2116   WBC 11.35* 12.59*   Hgb 12.5 12.0   Hematocrit 39.4 38.0   Platelets 263 248   MCV 91.4 91.6       Recent Labs     01/02/19  0103 01/01/19  2116   Sodium 137 140   Potassium 4.3 4.5   Chloride 110 112*   CO2 17* 19*   BUN 9.0 8.0   Creatinine 0.9 0.9   Glucose 155* 109*   Calcium 9.3 9.4       Recent Labs     01/01/19  2116   AST (SGOT) 18   ALT 9   Alkaline Phosphatase 80   Protein, Total 5.4*   Albumin 2.7*   Bilirubin, Total 0.2         Imaging studies:           Thanks for the consultation          Signed by: Heywood Footman, MD  Date Time: 01/02/19 6:40 PM      *This note was generated by the Epic EMR system/ Dragon  speech recognition and may contain inherent errors or omissions not intended by the user. Grammatical errors, random word insertions, deletions, pronoun errors and incomplete sentences are occasional consequences of this technology due to software limitations. Not all errors are caught or corrected. If there are questions or concerns about the content of this note or information contained within the body of this dictation they should be addressed directly with the author for clarification

## 2019-01-03 ENCOUNTER — Inpatient Hospital Stay: Payer: Medicaid Other

## 2019-01-03 ENCOUNTER — Other Ambulatory Visit: Payer: Medicaid Other

## 2019-01-03 LAB — BASIC METABOLIC PANEL
Anion Gap: 6 (ref 5.0–15.0)
BUN: 9 mg/dL (ref 7.0–19.0)
CO2: 21 mEq/L — ABNORMAL LOW (ref 22–29)
Calcium: 10.2 mg/dL (ref 8.5–10.5)
Chloride: 112 mEq/L — ABNORMAL HIGH (ref 100–111)
Creatinine: 0.7 mg/dL (ref 0.6–1.0)
Glucose: 129 mg/dL — ABNORMAL HIGH (ref 70–100)
Potassium: 4.6 mEq/L (ref 3.5–5.1)
Sodium: 139 mEq/L (ref 136–145)

## 2019-01-03 LAB — LIPID PANEL
Cholesterol / HDL Ratio: 2.4
Cholesterol: 137 mg/dL (ref 0–199)
HDL: 56 mg/dL (ref 40–9999)
LDL Calculated: 70 mg/dL (ref 0–99)
Triglycerides: 54 mg/dL (ref 34–149)
VLDL Calculated: 11 mg/dL (ref 10–40)

## 2019-01-03 LAB — HEMOLYSIS INDEX
Hemolysis Index: 1 (ref 0–18)
Hemolysis Index: 5 (ref 0–18)

## 2019-01-03 LAB — CBC
Absolute NRBC: 0 10*3/uL (ref 0.00–0.00)
Hematocrit: 37.6 % (ref 34.7–43.7)
Hgb: 12 g/dL (ref 11.4–14.8)
MCH: 29.3 pg (ref 25.1–33.5)
MCHC: 31.9 g/dL (ref 31.5–35.8)
MCV: 91.9 fL (ref 78.0–96.0)
MPV: 11.3 fL (ref 8.9–12.5)
Nucleated RBC: 0 /100 WBC (ref 0.0–0.0)
Platelets: 277 10*3/uL (ref 142–346)
RBC: 4.09 10*6/uL (ref 3.90–5.10)
RDW: 14 % (ref 11–15)
WBC: 16.8 10*3/uL — ABNORMAL HIGH (ref 3.10–9.50)

## 2019-01-03 LAB — GLUCOSE WHOLE BLOOD - POCT
Whole Blood Glucose POCT: 136 mg/dL — ABNORMAL HIGH (ref 70–100)
Whole Blood Glucose POCT: 155 mg/dL — ABNORMAL HIGH (ref 70–100)

## 2019-01-03 LAB — GFR: EGFR: >60

## 2019-01-03 LAB — PROCALCITONIN: Procalcitonin: 0.02 (ref 0.00–0.10)

## 2019-01-03 MED ORDER — OXYCODONE-ACETAMINOPHEN 5-325 MG PO TABS
2.0000 | ORAL_TABLET | Freq: Three times a day (TID) | ORAL | Status: DC | PRN
Start: 2019-01-03 — End: 2019-01-04
  Administered 2019-01-03 – 2019-01-04 (×3): 2 via ORAL
  Filled 2019-01-03 (×3): qty 2

## 2019-01-03 NOTE — Progress Notes (Addendum)
[facility]  PROGRESS NOTE      Patient: [name]  Date: 01/03/2019   LOS: 1 Days  Admission Date: 01/01/2019   MRN: [id number]  Attending: Adrienne Mocha  Please contact me on [employer]            SUBJECTIVE     Patient seen and examined at bedside with RN. Reports cough and shortness of breath.   She is requesting her home prn oxycodone 71m q8h. States she is on indomethacin for chronic knee pain.  Discussed holding NSAIDs and is agreeable. Updated on plan of care, CTA chest results.   Denies chest pain, abdominal pain, nausea, vomiting, diarrhea.        OBJECTIVE     Vitals:    01/03/19 1106   BP: 124/80   Pulse: 94   Resp: 20   Temp: 98.2 F (36.8 C)   SpO2: 92%       Temperature: Temp  Min: 97.7 F (36.5 C)  Max: 99.4 F (37.4 C)  Pulse: Pulse  Min: 87  Max: 98  Respiratory: Resp  Min: 16  Max: 20  Non-Invasive BP: BP  Min: 109/64  Max: 128/57  Pulse Oximetry SpO2  Min: 92 %  Max: 95 %    Intake and Output Summary (Last 24 hours) at Date Time    Intake/Output Summary (Last 24 hours) at 01/03/2019 1417  Last data filed at 01/03/2019 0900  Gross per 24 hour   Intake 1713 ml   Output 800 ml   Net 913 ml       PHYSICAL EXAMINATION  GEN APPEARANCE: ill-appearing, mild sob with speaking otherwise NAD.   Frequently coughing. Alert and cooperative.   HEENT: NCAT, EOMI, PERRL, no nasal discharge.   conjunctivae/corneas clear. Oral mucosa moist.   Neck: Supple without meningismus   CVS: RRR, S1, S2. No murmur   LUNGS: coarse breath sounds with diffuse wheezes. On O2.   ABD: BS+, soft, NT, ND, no guarding or rigidity  EXT: No edema; Pulses 2+ and intact  NEURO: AAOx3, CN 2-12 intact.  No focal neurological deficits  MENTAL STATUS: anxious otherwise appropriate affect and mood      MEDICATIONS     Current Facility-Administered Medications   Medication Dose Route Frequency    albuterol-ipratropium  3 mL Nebulization Q4H SCH    aspirin  81 mg Oral Daily    cefepime  1 g Intravenous Q12H [place]   doxycycline   100 mg Oral Q12H [place]   enoxaparin  0.5 mg/kg Subcutaneous Daily    fluticasone furoate-vilanterol  1 puff Inhalation QAM    gabapentin  800 mg Oral TID    guaiFENesin  600 mg Oral Q12H [place]   insulin lispro  1-3 Units Subcutaneous QHS    insulin lispro  1-5 Units Subcutaneous TID AC    lactobacillus/streptococcus  1 capsule Oral Daily    methylPREDNISolone  40 mg Intravenous Q8H    nicotine  1 patch Transdermal Daily    oxybutynin  5 mg Oral TID    pantoprazole  40 mg Oral QAM AC    risperiDONE  3 mg Oral BID    sertraline  100 mg Oral BID            LABS/IMAGING     Recent Labs   Lab 01/03/19  0319 01/02/19  0103 01/01/19  2116   WBC 16.80* 11.35* 12.59*   RBC 4.09 4.31 4.15   Hgb 12.0  12.5 12.0   Hematocrit 37.6 39.4 38.0   MCV 91.9 91.4 91.6   Platelets 277 263 248       Recent Labs   Lab 01/03/19  0319 01/02/19  0103 01/01/19  2116   Sodium 139 137 140   Potassium 4.6 4.3 4.5   Chloride 112* 110 112*   CO2 21* 17* 19*   BUN 9.0 9.0 8.0   Creatinine 0.7 0.9 0.9   Glucose 129* 155* 109*   Calcium 10.2 9.3 9.4       Recent Labs   Lab 01/01/19  2116   ALT 9   AST (SGOT) 18   Bilirubin, Total 0.2   Albumin 2.7*   Alkaline Phosphatase 80       Recent Labs   Lab 01/02/19  0839 01/02/19  0506 01/01/19  2116   Troponin I <0.01 <0.01 <0.01             Microbiology Results     Procedure Component Value Units Date/Time    Legionella antigen, urine [[id number]] Collected:  01/02/19 0120    Specimen:  Urine, Clean Catch Updated:  01/02/19 1146    Narrative:       ORDER#: [id number]                                    ORDERED BY: Gloris Manchester  SOURCE: Urine, Clean Catch                           COLLECTED:  01/02/19 01:20  ANTIBIOTICS AT COLL.:                                RECEIVED :  01/02/19 04:11  Legionella, Rapid Urinary Antigen          FINAL       01/02/19 11:46  01/02/19   Negative for Legionella pneumophila Serogroup 1 Antigen             Limitations of Test:             1. Negative results do not  exclude infection with Legionella                pneumophila Serogroup 1.             2. Does not detect other serogroups of L. pneumophila                or other Legionella species.             Test Reference Range: Negative      Rapid influenza A/B antigens [[id number]] Collected:  01/02/19 0104    Specimen:  Nasopharyngeal from Nasal Aspirate Updated:  01/02/19 0138    Narrative:       ORDER#: [id number]                                    ORDERED BY: Gloris Manchester  SOURCE: Nasal Aspirate                               COLLECTED:  01/02/19 01:04  ANTIBIOTICS AT COLL.:  RECEIVED :  01/02/19 01:19  Influenza Rapid Antigen A&B                FINAL       01/02/19 01:38  01/02/19   Negative for Influenza A and B             Reference Range: Negative      Respiratory Pathogen Panel, PCR (Film Array) [[id number]] Collected:  01/02/19 0104     Updated:  01/02/19 0644     Adenovirus Not Detected     Coronavirus 229E Not Detected     Coronavirus HKU1 Not Detected     Coronavirus NL63 Not Detected     Coronavirus OC43 Not Detected     Human Metapneumovirus Not Detected     Human Rhinovirus/Enterovirus Not Detected     Influenza A Not Detected     Influenza A/H1 Not Detected     Influenza AH1 - 2009 Not Detected     Influenza A/H3 Not Detected     Influenza B Not Detected     Parainfluenza Virus 1 Not Detected     Parainfluenza Virus 2 Not Detected     Parainfluenza Virus 3 Not Detected     Parainfluenza Virus 4 Not Detected     Respiratory Syncytial Virus Not Detected     Bordetella pertussis Not Detected     Chlamydophila pneumoniae Not Detected     Mycoplasma pneumoniae Not Detected     Comment: Multiplex nucleic acid amplification assay for detection of  18 respiratory viruses and bacteria. This assay cannot  differentiate Rhinovirus/Enterovirus. If necessary for  patient care, a positive result for Rhinovirus/Enterovirus  may be followed-up using an alternate method.  Viral and bacterial  nucleic acids may persist even though no  viable organism is present. Detection of nucleic acid does  not imply that the corresponding organisms are infectious or  are the causative agents of clinical symptoms. A negative  result does not exclude the possibility of viral or  bacterial infection. Performance characteristics may vary  with circulating strains. The assay may not be able to  distinguish between existing viral strains and new variants  as they emerge.The performance of this test has not been  established in individuals who received influenza vaccine.  Recent administration of a nasal influenza vaccine may cause  false positive results for Influenza A and/or Influenza B.  This assay is FDA cleared for nasopharyngeal swab samples.  Performance characteristics for Bronchoalveolar lavage  samples have been determined by the [facility] laboratory. Other  sample types are unacceptable.          Source NP Swab             Chest 2 Views    Result Date: 01/01/2019   Patchy bilateral perihilar opacities are nonspecific. Atypical infection cannot be excluded. Clinical correlation and continued follow-up is recommended. [name], MD 01/01/2019 10:01 PM    Ct Angiogram Chest    Result Date: 01/02/2019   1. This is a limited study due to multiple factors including patient motion and suboptimal bolus timing. No large central filling defect within the pulmonary arteries. 2. There is mosaic attenuation and air trapping worsened mid to upper lobe distribution. While this is a nonspecific finding but can be associated with many disease entities and etiologies, it can be seen with occlusive vascular disease such as chronic thromboembolic disease. Jeanella Cara, MD 01/02/2019 3:19 PM    Xr Chest Ap Portable  Result Date: 01/03/2019   Pulmonary edema pattern without change which may be seen with CHF or multifocal pneumonia Elyn Peers, MD 01/03/2019 9:46 AM         ASSESSMENT/PLAN   Patient Active Hospital Problem List:    Multilobar pneumonia    Acute COPD exacerbation   OSA on CPAP qhs    History of CAD    Anxiety            NATALEE TOMKIEWICZ is a 48 y.o. female with a history of COPD, active tobacco use, CAD/MI, and anxiety presenting with shortness of breath. she was hospitalized for pneumonia 3 months ago in [place].   She is admitted for pneumonia and acute COPD exacerbation. Elevated d-dimer, CTA chest negative for PE but concerning for extensive multilobar pneumonia. Lactic acid and procalcitonin normal.  Legionella, influenza, respiratory pathogen panel negative.      #Multilobar Pneumonia   #Acute COPD exacerbation  -CTA chest negative for PE but concerning for extensive multilobar pneumonia.  -ID following, appreciate recs. Possible atypical pneumonia. ?pulmonary fibrosis or ILD.   -Pulmonology following, appreciate recs  -continue cefepime and doxycycline   -continue iv solumedrol 68m q8h   -check venous lower extremity doppler and echo.   -repeat CXR reviewed   -followup Mycoplasma and Chlamydia antibodies   -continue supplemental oxygen,  BIPAP qhs   -continue breo, scheduled duonebs   -mucinex, incentive spirometry  -follow cultures   -monitor on telemetry    #CAD  -EKG shows NSR, nonspecific T wave changes. Serial troponins x 3 negative.   -continue aspirin  -continue to monitor on telemetry    #Tobacco use  -advised cessation. Nicotine patch.     #OSA   -on cpap qhs      #Anxiety  -continue Resporal Zoloft and Neurontin    #Overactive bladder  -continue oxybutynin      Code: FULL   DVT prophylaxis: lovenox  Disposition: continues to require inpatient hospitalization.           Signed,  FAdrienne Mocha          This chart was generated using hospital voice-recognition software which does not employ spell-checking or grammar-checking features. It was dictated, all or in part, in a busy and often noisy patient care environment. I have taken all usual measures to dictate carefully and to review all aspects this chart.  Nonetheless, given the known and well-documented performance characteristics of VR software in such patient care environments, this dictation still may contain unrecognized and wholly unintended errors or omissions

## 2019-01-03 NOTE — Progress Notes (Signed)
Infectious Diseases & Tropical Medicine  Progress Note    01/03/2019   [name] UTM:[id number] is a 48 y.o. female,       Assessment:      Multifocal pneumonia   COPD exacerbation   Respiratory pathogen panel unremarkable   Chest x-ray-pulmonary edema/multifocal pneumonia   Procalcitonin level-0.02 (01/02/2019)   History of heavy smoking   Leukocytosis-on steroids   Morbid obesity    Plan:      Continue cefepime and doxycycline   Sputum culture   Mycoplasma and Chlamydia antibodies pending   Pulmonary following-May need bronchoscopy/pulmonary function tests   Follow-up chest x-ray   Continue probiotics   Monitor electrolytes and renal functions closely   Monitor clinically      Discussed with patient in detail    Discussed with Dr. Shelton Silvas      ROS:     General:  no fever, no chills, no rigor, awake and alert,comfortable  HEENT: no neck pain, no throat pain  Endocrine:  no fatigue, no night sweats  Respiratory: Complains of cough sometimes productive of yellowish sputum, complains of shortness of breath  Cardiovascular: no chest pain   Gastrointestinal: no abdominal pain,no N/V/D  Genito-Urinary: no dysuria, increased urinary frequency, trouble voiding, or hematuria   Musculoskeletal: no edema  Neurological: c/o generalized weakness   Dermatological: no rash, no ulcer    Physical Examination:     Blood pressure 109/64, pulse 87, temperature 97.7 F (36.5 C), temperature source Oral, resp. rate 19, height 1.753 m (_0 ), weight 137 kg (302 lb), SpO2 95 %.     General Appearance: Comfortable, and in no acute distress.   HEENT: Pupils are equal, round, and reactive to light.    Lungs: Bilateral scattered Rales   Heart:  Regular rate and rhythm   Chest: Symmetric chest wall expansion.    Abdomen: soft ,non tender,no hepatosplenomegaly,good bowel sounds   Neurological: No focal deficit   Extremities: No edema    Laboratory And Diagnostic Studies:     Recent Labs      01/03/19  0319 01/02/19  0103   WBC 16.80* 11.35*   Hgb 12.0 12.5   Hematocrit 37.6 39.4   Platelets 277 263     Recent Labs     01/03/19  0319 01/02/19  0103   Sodium 139 137   Potassium 4.6 4.3   Chloride 112* 110   CO2 21* 17*   BUN 9.0 9.0   Creatinine 0.7 0.9   Glucose 129* 155*   Calcium 10.2 9.3     Recent Labs     01/01/19  2116   AST (SGOT) 18   ALT 9   Alkaline Phosphatase 80   Protein, Total 5.4*   Albumin 2.7*       Current Meds:      Scheduled Meds: PRN Meds:    albuterol-ipratropium, 3 mL, Nebulization, Q4H SCH  aspirin, 81 mg, Oral, Daily  cefepime, 1 g, Intravenous, Q12H SCH  doxycycline, 100 mg, Oral, Q12H SCH  enoxaparin, 0.5 mg/kg, Subcutaneous, Daily  fluticasone furoate-vilanterol, 1 puff, Inhalation, QAM  gabapentin, 800 mg, Oral, TID  guaiFENesin, 600 mg, Oral, Q12H SCH  insulin lispro, 1-3 Units, Subcutaneous, QHS  insulin lispro, 1-5 Units, Subcutaneous, TID AC  lactobacillus/streptococcus, 1 capsule, Oral, Daily  methylPREDNISolone, 40 mg, Intravenous, Q8H  nicotine, 1 patch, Transdermal, Daily  oxybutynin, 5 mg, Oral, TID  pantoprazole, 40 mg, Oral, QAM AC  risperiDONE, 3 mg, Oral, BID  sertraline, 100  mg, Oral, BID        Continuous Infusions:   acetaminophen, 650 mg, Q4H PRN    Or  acetaminophen, 650 mg, Q4H PRN  ALPRAZolam, 0.5 mg, BID PRN  benzonatate, 200 mg, TID PRN  dextrose, 15 g of glucose, PRN    And  glucagon (rDNA), 1 mg, PRN    And  dextrose, 125 mL, PRN  diphenhydrAMINE, 25 mg, Q6H PRN  guaiFENesin-dextromethorphan, 5 mL, Q6H PRN  influenza, 0.5 mL, Prior to discharge  naloxone, 0.2 mg, PRN  nitroglycerin, 0.4 mg, Q5 Min PRN  ondansetron, 4 mg, Q6H PRN    Or  ondansetron, 4 mg, Q6H PRN  senna-docusate, 2 tablet, QHS PRN          [name], M.D.  01/03/2019  9:42 AM

## 2019-01-03 NOTE — Progress Notes (Signed)
Pulmonary Rehab Assessment Note - [place] Order    Patient meets criteria:  Yes     Patient given explanation of Pulmonary Rehab and brochure: Yes

## 2019-01-03 NOTE — Plan of Care (Signed)
Problem: Safety  Goal: Patient will be free from injury during hospitalization  Outcome: Progressing  Bed alarm kept activated, hourly rounding,     Problem: Safety  Goal: Patient will be free from infection during hospitalization  Outcome: Progressing  No sign and symptoms of infection     Problem: Pain  Goal: Pain at adequate level as identified by patient  Outcome: Progressing  C/o pain to back tylenol 650 mg po given which  According to patient didn't help requesting for oxycodone   was d/c. Dr Bertha Stakes notified.     Problem: Inadequate Gas Exchange  Goal: Adequate oxygenation and improved ventilation  Outcome: Progressing  On 2 L 02 nc, on scheduled neb treatment  Has been coughing robitussinand tessalon perle given     Problem: Inadequate Gas Exchange  Goal: Patent Airway maintained  Outcome: Progressing     Problem: Nutrition  Goal: Nutritional intake is adequate  Outcome: Progressing     Problem: Inadequate Airway Clearance  Goal: Normal respiratory rate/effort achieved/maintained  Outcome: Progressing

## 2019-01-03 NOTE — Plan of Care (Signed)
Problem: Safety  Goal: Patient will be free from injury during hospitalization  Outcome: Progressing  Goal: Patient will be free from infection during hospitalization  Outcome: Progressing     Problem: Pain  Goal: Pain at adequate level as identified by patient  Outcome: Progressing  Flowsheets (Taken 01/02/2019 0551 by Feliberto Gottron Rycca R, RN)  Pain at adequate level as identified by patient: Assess pain on admission, during daily assessment and/or before any "as needed" intervention(s);Evaluate if patient comfort function goal is met;Offer non-pharmacological pain management interventions     Problem: Side Effects from Pain Analgesia  Goal: Patient will experience minimal side effects of analgesic therapy  Outcome: Progressing     Problem: Discharge Barriers  Goal: Patient will be discharged home or other facility with appropriate resources  Outcome: Progressing     Problem: Psychosocial and Spiritual Needs  Goal: Demonstrates ability to cope with hospitalization/illness  Outcome: Progressing     Problem: Moderate/High Fall Risk Score >5  Goal: Patient will remain free of falls  Outcome: Progressing     Problem: Compromised Hemodynamic Status  Goal: Vital signs and fluid balance maintained/improved  Outcome: Progressing  Flowsheets (Taken 01/03/2019 2259)  Vital signs and fluid balance are maintained/improved: Position patient for maximum circulation/cardiac output; Monitor/assess vitals and hemodynamic parameters with position changes; Monitor and compare daily weight; Monitor intake and output. Notify LIP if urine output is less than 30 mL/hour.; Monitor/assess lab values and report abnormal values     Problem: Inadequate Gas Exchange  Goal: Adequate oxygenation and improved ventilation  Outcome: Progressing  Flowsheets (Taken 01/03/2019 2259)  Adequate oxygenation and improved ventilation: Assess lung sounds; Monitor SpO2 and treat as needed; Provide mechanical and oxygen support to facilitate gas exchange;  Position for maximum ventilatory efficiency; Teach/reinforce use of incentive spirometer 10 times per hour while awake, cough and deep breath as needed; Plan activities to conserve energy: plan rest periods; Increase activity as tolerated/progressive mobility; Consult/collaborate with Respiratory Therapy  Goal: Patent Airway maintained  Outcome: Progressing     Problem: Inadequate Airway Clearance  Goal: Normal respiratory rate/effort achieved/maintained  Outcome: Progressing  Flowsheets (Taken 01/03/2019 2259)  Normal respiratory rate/effort achieved/maintained: Plan activities to conserve energy: plan rest periods     Problem: Inadequate Tissue Perfusion-Venous  Goal: Tissue perfusion is adequate-venous  Outcome: Progressing     Problem: Impaired Mobility  Goal: Mobility/Activity is maintained at optimal level for patient  Outcome: Progressing     Problem: Nutrition  Goal: Nutritional intake is adequate  Outcome: Progressing     Problem: Pain interferes with ability to perform ADL  Goal: Pain at adequate level as identified by patient  Outcome: Progressing  Flowsheets (Taken 01/02/2019 0551 by Feliberto Gottron Rycca R, RN)  Pain at adequate level as identified by patient: Assess pain on admission, during daily assessment and/or before any "as needed" intervention(s);Evaluate if patient comfort function goal is met;Offer non-pharmacological pain management interventions

## 2019-01-03 NOTE — Progress Notes (Signed)
PULM. CCM PROGRESS NOTE    Date Time: 01/03/19 7:04 PM  Patient Name: [name], [name]      Assessment:     .  Multilobar pneumonia  .  Chronic obstructive pulmonary disease with exacerbation  .  Morbid obesity, sleep apnea  .  Coronary artery disease  .  Hyperglycemia  .  Anxiety disorder    Plan:     .  Antibiotic was changed to meropenem today, follow chest x-ray  .  Continue DuoNeb, Breo, also on IV Solu-Medrol which can be changed to p.o. prednisone in the morning  .  Continue nocturnal BiPAP  .  Stable coronary status  .  Monitor blood sugar continue sliding scale    Discussed with patient    Subjective:   Patient is a 48 y.o. female who is awake, alert, and comfortable.     Medications:     Current Facility-Administered Medications   Medication Dose Route Frequency    albuterol-ipratropium  3 mL Nebulization Q4H SCH    aspirin  81 mg Oral Daily    cefepime  1 g Intravenous Q12H SCH    doxycycline  100 mg Oral Q12H SCH    enoxaparin  0.5 mg/kg Subcutaneous Daily    fluticasone furoate-vilanterol  1 puff Inhalation QAM    gabapentin  800 mg Oral TID    guaiFENesin  600 mg Oral Q12H [place]    insulin lispro  1-3 Units Subcutaneous QHS    insulin lispro  1-5 Units Subcutaneous TID AC    lactobacillus/streptococcus  1 capsule Oral Daily    methylPREDNISolone  40 mg Intravenous Q8H    nicotine  1 patch Transdermal Daily    oxybutynin  5 mg Oral TID    pantoprazole  40 mg Oral QAM AC    risperiDONE  3 mg Oral BID    sertraline  100 mg Oral BID       Review of Systems:     General ROS: negative for - chills, fever or night sweats  Ophthalmic ROS: negative for - double vision, excessive tearing, itchy eyes or photophobia  ENT ROS: negative for - headaches, nasal congestion, sore throat or vertigo  Respiratory ROS: negative for - cough, hemoptysis, orthopnea, pleuritic pain, shortness of breath is little better, denies any tachypnea or wheezing  Cardiovascular ROS: negative for - chest pain, edema,  orthopnea, rapid heart rate  Gastrointestinal ROS: negative for - abdominal pain, blood in stools, constipation, diarrhea, heartburn or nausea/vomiting  Musculoskeletal ROS: Positive for knee pain   Neurological ROS: negative for - confusion, dizziness, headaches, speech problems, visual changes or weakness  Dermatological ROS: negative for dry skin, pruritus and rash    Physical Exam:   BP 140/79    Pulse (!) 102    Temp 98.4 F (36.9 C) (Oral)    Resp 21    Ht 1.753 m (_0 )    Wt 137 kg (302 lb)    SpO2 91%    BMI 44.60 kg/m     Intake and Output Summary (Last 24 hours) at Date Time    Intake/Output Summary (Last 24 hours) at 01/03/2019 1904  Last data filed at 01/03/2019 0900  Gross per 24 hour   Intake 1713 ml   Output 800 ml   Net 913 ml       General appearance - alert,  and in no distress  Mental status - alert, oriented to person, place, and time  Eyes - pupils equal  and reactive, extraocular eye movements intact  Ears - no external ear lesions seen  Nose - no nasal discharge   Mouth - clear oral mucosa, moist   Neck - supple, no significant adenopathy  Chest - clear to auscultation, no wheezes, rales or rhonchi, symmetric air entry  Heart - normal rate, regular rhythm, normal S1, S2, no rubs, clicks or gallops  Abdomen - soft, nontender, nondistended, no masses or organomegaly  Neurological - alert, oriented, normal speech, no focal findings or movement disorder noted  Musculoskeletal - no joint swelling or tenderness  Extremities -trace pedal edema, no clubbing or cyanosis  Skin - normal coloration, no rashes    Labs:     Recent Labs   Lab 01/03/19  0319 01/02/19  0103 01/01/19  2116   WBC 16.80* 11.35* 12.59*   Hgb 12.0 12.5 12.0   Hematocrit 37.6 39.4 38.0   Platelets 277 263 248   MCV 91.9 91.4 91.6   Neutrophils  --   --  76.4     Recent Labs   Lab 01/03/19  0319 01/02/19  0103 01/01/19  2116   Sodium 139 137 140   Potassium 4.6 4.3 4.5   Chloride 112* 110 112*   CO2 21* 17* 19*   BUN 9.0 9.0 8.0    Creatinine 0.7 0.9 0.9   Glucose 129* 155* 109*   Calcium 10.2 9.3 9.4   Protein, Total  --   --  5.4*   Albumin  --   --  2.7*   AST (SGOT)  --   --  18   ALT  --   --  9   Alkaline Phosphatase  --   --  80   Bilirubin, Total  --   --  0.2     Glucose:    Recent Labs   Lab 01/03/19  0319 01/02/19  0103 01/01/19  2116   Glucose 129* 155* 109*           Rads:   Radiological Procedure reviewed.  Radiology Results (24 Hour)     Procedure Component Value Units Date/Time    US Venous Low Extrem Duplx Dopp Comp Bilat [[id number]] Collected:  01/03/19 1713    Order Status:  Completed Updated:  01/03/19 1718    Narrative:       BILATERAL LOWER EXTREMITY VENOUS DUPLEX EXAM PERFORMED 01/03/2019    HISTORY: Dyspnea, elevated d-dimer, smoking history since 48 years old.  History of double pneumonia.     PROCEDURE: Real-time duplex examination of the bilateral lower extremity  venous system was performed. Both compression techniques and color  Doppler techniques were utilized.    FINDINGS:    Bilateral common femoral veins, profunda femoral veins, femoral veins,  popliteal veins are all patent with grossly normal venous waveforms.    Bilateral anterior tibial, posterior tibial, peroneal veins are patent.    Bilateral common iliac veins have normal phasic flow.      Impression:        No ultrasound evidence of DVT in either lower extremity.    Graciella Freer, MD   01/03/2019 5:14 PM    XR Chest AP Portable [[id number]] Collected:  01/03/19 0946    Order Status:  Completed Updated:  01/03/19 0950    Narrative:       History: followup pneumonia    Technique: Single Portable View    Comparison: 01/01/19    Findings:  There is vascular congestion with mixed interstitial and  patchy alveolar  opacities, findings indicating moderate pulmonary edema pattern.   There is no pneumothorax.  There is mild cardiomegaly.     The mediastinum is within normal limits.             Impression:        Pulmonary edema pattern without change which  may  be seen with CHF or multifocal pneumonia    Elyn Peers, MD   01/03/2019 9:46 AM            Inda Castle, MD  Pulmonary and critical care  01/03/19

## 2019-01-03 NOTE — Progress Notes (Signed)
2/14 Multilobar PNA COPD OSA cpap at home CAD IV abx ID consult duonebs; very drowsy; change to bipap; ABG DCP From [place]; watch for O2 needs; f/u support and transport  Crist Infante, RN, BSN, M.S.  RN Case Manager  [phone]

## 2019-01-04 LAB — CBC
Absolute NRBC: 0 10*3/uL (ref 0.00–0.00)
Hematocrit: 36 % (ref 34.7–43.7)
Hgb: 11.7 g/dL (ref 11.4–14.8)
MCH: 29.8 pg (ref 25.1–33.5)
MCHC: 32.5 g/dL (ref 31.5–35.8)
MCV: 91.6 fL (ref 78.0–96.0)
MPV: 10.3 fL (ref 8.9–12.5)
Nucleated RBC: 0 /100 WBC (ref 0.0–0.0)
Platelets: 303 10*3/uL (ref 142–346)
RBC: 3.93 10*6/uL (ref 3.90–5.10)
RDW: 14 % (ref 11–15)
WBC: 14.13 10*3/uL — ABNORMAL HIGH (ref 3.10–9.50)

## 2019-01-04 LAB — BASIC METABOLIC PANEL
Anion Gap: 8 (ref 5.0–15.0)
BUN: 13 mg/dL (ref 7.0–19.0)
CO2: 22 mEq/L (ref 22–29)
Calcium: 10 mg/dL (ref 8.5–10.5)
Chloride: 111 mEq/L (ref 100–111)
Creatinine: 0.7 mg/dL (ref 0.6–1.0)
Glucose: 128 mg/dL — ABNORMAL HIGH (ref 70–100)
Potassium: 4.9 mEq/L (ref 3.5–5.1)
Sodium: 141 mEq/L (ref 136–145)

## 2019-01-04 LAB — GLUCOSE WHOLE BLOOD - POCT
Whole Blood Glucose POCT: 130 mg/dL — ABNORMAL HIGH (ref 70–100)
Whole Blood Glucose POCT: 160 mg/dL — ABNORMAL HIGH (ref 70–100)
Whole Blood Glucose POCT: 128 mg/dL — ABNORMAL HIGH (ref 70–100)
Whole Blood Glucose POCT: 193 mg/dL — ABNORMAL HIGH (ref 70–100)

## 2019-01-04 LAB — GFR: EGFR: >60

## 2019-01-04 LAB — HEMOLYSIS INDEX: Hemolysis Index: 3 (ref 0–18)

## 2019-01-04 MED ORDER — OXYCODONE-ACETAMINOPHEN 5-325 MG PO TABS
2.0000 | ORAL_TABLET | Freq: Four times a day (QID) | ORAL | Status: DC | PRN
Start: 2019-01-04 — End: 2019-01-11
  Administered 2019-01-04 – 2019-01-11 (×23): 2 via ORAL
  Filled 2019-01-04 (×23): qty 2

## 2019-01-04 NOTE — Progress Notes (Signed)
[facility]  PROGRESS NOTE      Patient: [name]  Date: 01/04/2019   LOS: 2 Days  Admission Date: 01/01/2019   MRN: [id number]  Attending: Adrienne Mocha  Please contact me on [employer]            SUBJECTIVE     Patient seen and examined at bedside with RN. Requiring 5L NC during the day.   Wore bipap overnight. Feeling a bit better today.   Still having cough. Updated on plan of care.  Denies chest pain, abdominal pain, nausea, vomiting, diarrhea.        OBJECTIVE     Vitals:    01/04/19 1121   BP: 121/60   Pulse: 88   Resp: 20   Temp: 97.7 F (36.5 C)   SpO2: 90%       Temperature: Temp  Min: 97.1 F (36.2 C)  Max: 98.6 F (37 C)  Pulse: Pulse  Min: 72  Max: 102  Respiratory: Resp  Min: 19  Max: 21  Non-Invasive BP: BP  Min: 106/55  Max: 147/66  Pulse Oximetry SpO2  Min: 90 %  Max: 97 %    Intake and Output Summary (Last 24 hours) at Date Time    Intake/Output Summary (Last 24 hours) at 01/04/2019 1204  Last data filed at 01/04/2019 0700  Gross per 24 hour   Intake    Output 1300 ml   Net -1300 ml       PHYSICAL EXAMINATION  GEN APPEARANCE: chronically ill-appearing in NAD. Alert and cooperative.   Frequently coughing. Appears more comfortable today and less short of breath.    HEENT: NCAT, EOMI, PERRL, no nasal discharge.   conjunctivae/corneas clear. Oral mucosa moist.   Neck: Supple without meningismus   CVS: RRR, S1, S2. No murmur   LUNGS: coarse breath sounds with diffuse wheezes, symmetric air entry. On O2.   ABD: BS+, soft, NT, ND, no guarding or rigidity  EXT: No edema; Pulses 2+ and intact  NEURO: AAOx3, CN 2-12 intact.  No focal neurological deficits  MENTAL STATUS: anxious otherwise appropriate affect and mood      MEDICATIONS     Current Facility-Administered Medications   Medication Dose Route Frequency    albuterol-ipratropium  3 mL Nebulization Q4H SCH    aspirin  81 mg Oral Daily    cefepime  1 g Intravenous Q12H [place]    doxycycline  100 mg Oral Q12H [place]    enoxaparin  0.5 mg/kg  Subcutaneous Daily    fluticasone furoate-vilanterol  1 puff Inhalation QAM    gabapentin  800 mg Oral TID    guaiFENesin  600 mg Oral Q12H [place]    insulin lispro  1-3 Units Subcutaneous QHS    insulin lispro  1-5 Units Subcutaneous TID AC    lactobacillus/streptococcus  1 capsule Oral Daily    methylPREDNISolone  40 mg Intravenous Q8H    nicotine  1 patch Transdermal Daily    oxybutynin  5 mg Oral TID    pantoprazole  40 mg Oral QAM AC    risperiDONE  3 mg Oral BID    sertraline  100 mg Oral BID            LABS/IMAGING     Recent Labs   Lab 01/04/19  0449 01/03/19  0319 01/02/19  0103   WBC 14.13* 16.80* 11.35*   RBC 3.93 4.09 4.31   Hgb 11.7 12.0 12.5   Hematocrit 36.0 37.6  39.4   MCV 91.6 91.9 91.4   Platelets 303 277 263       Recent Labs   Lab 01/04/19  0449 01/03/19  0319 01/02/19  0103 01/01/19  2116   Sodium 141 139 137 140   Potassium 4.9 4.6 4.3 4.5   Chloride 111 112* 110 112*   CO2 22 21* 17* 19*   BUN 13.0 9.0 9.0 8.0   Creatinine 0.7 0.7 0.9 0.9   Glucose 128* 129* 155* 109*   Calcium 10.0 10.2 9.3 9.4       Recent Labs   Lab 01/01/19  2116   ALT 9   AST (SGOT) 18   Bilirubin, Total 0.2   Albumin 2.7*   Alkaline Phosphatase 80       Recent Labs   Lab 01/02/19  0839 01/02/19  0506 01/01/19  2116   Troponin I <0.01 <0.01 <0.01             Microbiology Results     Procedure Component Value Units Date/Time    Legionella antigen, urine [[id number]] Collected:  01/02/19 0120    Specimen:  Urine, Clean Catch Updated:  01/02/19 1146    Narrative:       ORDER#: [id number]                                    ORDERED BY: Gloris Manchester  SOURCE: Urine, Clean Catch                           COLLECTED:  01/02/19 01:20  ANTIBIOTICS AT COLL.:                                RECEIVED :  01/02/19 04:11  Legionella, Rapid Urinary Antigen          FINAL       01/02/19 11:46  01/02/19   Negative for Legionella pneumophila Serogroup 1 Antigen             Limitations of Test:             1. Negative results do not  exclude infection with Legionella                pneumophila Serogroup 1.             2. Does not detect other serogroups of L. pneumophila                or other Legionella species.             Test Reference Range: Negative      Rapid influenza A/B antigens [[id number]] Collected:  01/02/19 0104    Specimen:  Nasopharyngeal from Nasal Aspirate Updated:  01/02/19 0138    Narrative:       ORDER#: [id number]                                    ORDERED BY: Gloris Manchester  SOURCE: Nasal Aspirate                               COLLECTED:  01/02/19 01:04  ANTIBIOTICS AT COLL.:  RECEIVED :  01/02/19 01:19  Influenza Rapid Antigen A&B                FINAL       01/02/19 01:38  01/02/19   Negative for Influenza A and B             Reference Range: Negative      Respiratory Pathogen Panel, PCR (Film Array) [[id number]] Collected:  01/02/19 0104     Updated:  01/02/19 0644     Adenovirus Not Detected     Coronavirus 229E Not Detected     Coronavirus HKU1 Not Detected     Coronavirus NL63 Not Detected     Coronavirus OC43 Not Detected     Human Metapneumovirus Not Detected     Human Rhinovirus/Enterovirus Not Detected     Influenza A Not Detected     Influenza A/H1 Not Detected     Influenza AH1 - 2009 Not Detected     Influenza A/H3 Not Detected     Influenza B Not Detected     Parainfluenza Virus 1 Not Detected     Parainfluenza Virus 2 Not Detected     Parainfluenza Virus 3 Not Detected     Parainfluenza Virus 4 Not Detected     Respiratory Syncytial Virus Not Detected     Bordetella pertussis Not Detected     Chlamydophila pneumoniae Not Detected     Mycoplasma pneumoniae Not Detected     Comment: Multiplex nucleic acid amplification assay for detection of  18 respiratory viruses and bacteria. This assay cannot  differentiate Rhinovirus/Enterovirus. If necessary for  patient care, a positive result for Rhinovirus/Enterovirus  may be followed-up using an alternate method.  Viral and bacterial  nucleic acids may persist even though no  viable organism is present. Detection of nucleic acid does  not imply that the corresponding organisms are infectious or  are the causative agents of clinical symptoms. A negative  result does not exclude the possibility of viral or  bacterial infection. Performance characteristics may vary  with circulating strains. The assay may not be able to  distinguish between existing viral strains and new variants  as they emerge.The performance of this test has not been  established in individuals who received influenza vaccine.  Recent administration of a nasal influenza vaccine may cause  false positive results for Influenza A and/or Influenza B.  This assay is FDA cleared for nasopharyngeal swab samples.  Performance characteristics for Bronchoalveolar lavage  samples have been determined by the [facility] laboratory. Other  sample types are unacceptable.          Source NP Swab             Chest 2 Views    Result Date: 01/01/2019   Patchy bilateral perihilar opacities are nonspecific. Atypical infection cannot be excluded. Clinical correlation and continued follow-up is recommended. [name], MD 01/01/2019 10:01 PM    Ct Angiogram Chest    Result Date: 01/02/2019   1. This is a limited study due to multiple factors including patient motion and suboptimal bolus timing. No large central filling defect within the pulmonary arteries. 2. There is mosaic attenuation and air trapping worsened mid to upper lobe distribution. While this is a nonspecific finding but can be associated with many disease entities and etiologies, it can be seen with occlusive vascular disease such as chronic thromboembolic disease. Jeanella Cara, MD 01/02/2019 3:19 PM    Xr Chest Ap Portable  Result Date: 01/03/2019   Pulmonary edema pattern without change which may be seen with CHF or multifocal pneumonia Elyn Peers, MD 01/03/2019 9:46 AM    US Venous Low Extrem Duplx Dopp Comp Bilat    Result Date:  01/03/2019   No ultrasound evidence of DVT in either lower extremity. Graciella Freer, MD 01/03/2019 5:14 PM    Echo  Summary    * Left ventricular ejection fraction is normal with an estimated ejection  fraction of 60-65%.    * The left ventricle is mildly dilated.    * Left ventricular wall thickness is normal.    * Left ventricular diastolic filling parameters demonstrate normal diastolic  function.    * The right ventricular cavity size is dilated.    * Normal right ventricular systolic function.    * Moderate pulmonary hypertension with estimated right ventricular systolic  pressure of  54 mmHg.    * The left atrium is mildly dilated.    * There is no aortic stenosis.    * There is no aortic regurgitation.    * There is mild mitral regurgitation.    * The aortic root is mildly dilated.    * The IVC is mildly dilated to  2.26 cm.    * There is a small pericardial effusion.       ASSESSMENT/PLAN   Patient Active Hospital Problem List:   Multilobar pneumonia    Acute COPD exacerbation   OSA on CPAP qhs    History of CAD    Anxiety            [name] is a 48 y.o. female with a history of COPD, active tobacco use, CAD/MI, and anxiety presenting with shortness of breath. she was hospitalized for pneumonia 3 months ago in [place].   She is admitted for pneumonia and acute COPD exacerbation. Elevated d-dimer, CTA chest negative for PE but concerning for extensive multilobar pneumonia. venous lower extremity doppler negative for DVT. Lactic acid and procalcitonin normal.  Legionella, influenza, respiratory pathogen panel negative.    #Acute hypoxic respiratory failure   #Multilobar Pneumonia   #Acute COPD exacerbation  -CTA chest negative for PE but concerning for extensive multilobar pneumonia.  -ID following, appreciate recs. Possible atypical pneumonia. ?pulmonary fibrosis or ILD.   -Pulmonology following, appreciate recs  -continue cefepime and doxycycline   -continue iv solumedrol 70m q8h and taper   -echo  shows LVEF 60-65%, moderate pulmonary hypertension  -followup Mycoplasma and Chlamydia antibodies   -continue supplemental oxygen, on 5 lpm.  BIPAP qhs   -continue breo, scheduled duonebs   -mucinex, incentive spirometry  -follow cultures   -monitor on telemetry    #CAD  -EKG shows NSR, nonspecific T wave changes. Serial troponins x 3 negative.   -continue aspirin  -continue to monitor on telemetry    #Tobacco use  -advised cessation. Nicotine patch.     #OSA   -on cpap qhs      #Anxiety  -continue Resporal Zoloft and Neurontin    #Overactive bladder  -continue oxybutynin      Code: FULL   DVT prophylaxis: lovenox  Disposition: continues to require inpatient hospitalization.           Signed,  FAdrienne Mocha          This chart was generated using hospital voice-recognition software which does not employ spell-checking or grammar-checking features. It was dictated, all or in part, in a busy and often  noisy patient care environment. I have taken all usual measures to dictate carefully and to review all aspects this chart. Nonetheless, given the known and well-documented performance characteristics of VR software in such patient care environments, this dictation still may contain unrecognized and wholly unintended errors or omissions

## 2019-01-04 NOTE — Plan of Care (Signed)
Problem: Safety  Goal: Patient will be free from injury during hospitalization  Outcome: Progressing     Problem: Safety  Goal: Patient will be free from infection during hospitalization  Outcome: Progressing  On iv cefepime and po doxyycline     Problem: Pain  Goal: Pain at adequate level as identified by patient  Outcome: Progressing  Medicated for back pain oxycodone 2 po given     Problem: Side Effects from Pain Analgesia  Goal: Patient will experience minimal side effects of analgesic therapy  Outcome: Progressing  Will monitor adverse reaction from above medicine.                  Problem: Inadequate Gas Exchange  Goal: Adequate oxygenation and improved ventilation  Outcome: Progressing  On bi-pap at night and 02 nc 5 l during the day.

## 2019-01-04 NOTE — Progress Notes (Signed)
Infectious Diseases & Tropical Medicine  Progress Note    01/04/2019   Miu Chiong UEA:[id number] is a 48 y.o. female,       Assessment:      Multifocal pneumonia   COPD exacerbation   Respiratory pathogen panel unremarkable   Chest x-ray-pulmonary edema/multifocal pneumonia (01/03/2019)   Procalcitonin level-0.02 (01/02/2019)   History of heavy smoking   Leukocytosis improving-on steroids   Morbid obesity    Plan:      Continue cefepime and doxycycline   Sputum culture pending   Mycoplasma and Chlamydia antibodies pending   Pulmonary following   Follow-up chest x-ray   Continue probiotics   Monitor electrolytes and renal functions closely   Monitor clinically      Discussed with patient in detail     ROS:     General:  no fever, no chills, no rigor, awake and alert  HEENT: no neck pain, no throat pain  Endocrine:  no fatigue, no night sweats  Respiratory: Cough and shortness of breath improving  Cardiovascular: no chest pain   Gastrointestinal: Complains of abdominal muscle pain secondary to constant coughing,no N/V/D  Genito-Urinary: no dysuria, increased urinary frequency, trouble voiding, or hematuria   Musculoskeletal: no edema, complains of right hip pain  Neurological: c/o generalized weakness   Dermatological: no rash, no ulcer    Physical Examination:     Blood pressure 106/55, pulse 72, temperature 98 F (36.7 C), temperature source Oral, resp. rate 20, height 1.753 m (_0 ), weight 145 kg (319 lb 10.7 oz), SpO2 94 %.     General Appearance: Comfortable, and in no acute distress.   HEENT: Pupils are equal, round, and reactive to light.    Lungs: Scattered rhonchi   Heart:  Regular rate and rhythm   Chest: Symmetric chest wall expansion.    Abdomen: soft ,non tender,no hepatosplenomegaly,good bowel sounds   Neurological: No focal deficit   Extremities: No edema    Laboratory And Diagnostic Studies:     Recent Labs     01/04/19  0449 01/03/19  0319   WBC 14.13*  16.80*   Hgb 11.7 12.0   Hematocrit 36.0 37.6   Platelets 303 277     Recent Labs     01/04/19  0449 01/03/19  0319   Sodium 141 139   Potassium 4.9 4.6   Chloride 111 112*   CO2 22 21*   BUN 13.0 9.0   Creatinine 0.7 0.7   Glucose 128* 129*   Calcium 10.0 10.2     Recent Labs     01/01/19  2116   AST (SGOT) 18   ALT 9   Alkaline Phosphatase 80   Protein, Total 5.4*   Albumin 2.7*       Current Meds:      Scheduled Meds: PRN Meds:    albuterol-ipratropium, 3 mL, Nebulization, Q4H SCH  aspirin, 81 mg, Oral, Daily  cefepime, 1 g, Intravenous, Q12H SCH  doxycycline, 100 mg, Oral, Q12H SCH  enoxaparin, 0.5 mg/kg, Subcutaneous, Daily  fluticasone furoate-vilanterol, 1 puff, Inhalation, QAM  gabapentin, 800 mg, Oral, TID  guaiFENesin, 600 mg, Oral, Q12H SCH  insulin lispro, 1-3 Units, Subcutaneous, QHS  insulin lispro, 1-5 Units, Subcutaneous, TID AC  lactobacillus/streptococcus, 1 capsule, Oral, Daily  methylPREDNISolone, 40 mg, Intravenous, Q8H  nicotine, 1 patch, Transdermal, Daily  oxybutynin, 5 mg, Oral, TID  pantoprazole, 40 mg, Oral, QAM AC  risperiDONE, 3 mg, Oral, BID  sertraline, 100 mg, Oral, BID  Continuous Infusions:   acetaminophen, 650 mg, Q4H PRN    Or  acetaminophen, 650 mg, Q4H PRN  ALPRAZolam, 0.5 mg, BID PRN  benzonatate, 200 mg, TID PRN  dextrose, 15 g of glucose, PRN    And  glucagon (rDNA), 1 mg, PRN    And  dextrose, 125 mL, PRN  diphenhydrAMINE, 25 mg, Q6H PRN  guaiFENesin-dextromethorphan, 5 mL, Q6H PRN  influenza, 0.5 mL, Prior to discharge  naloxone, 0.2 mg, PRN  nitroglycerin, 0.4 mg, Q5 Min PRN  ondansetron, 4 mg, Q6H PRN    Or  ondansetron, 4 mg, Q6H PRN  oxyCODONE-acetaminophen, 2 tablet, Q8H PRN  senna-docusate, 2 tablet, QHS PRN          Latoya Maulding A. Gustavus Messing, M.D.  01/04/2019  9:19 AM

## 2019-01-04 NOTE — Plan of Care (Signed)
Problem: Compromised Hemodynamic Status  Goal: Vital signs and fluid balance maintained/improved  Outcome: Progressing  Flowsheets (Taken 01/04/2019 1821)  Vital signs and fluid balance are maintained/improved: Position patient for maximum circulation/cardiac output; Monitor intake and output. Notify LIP if urine output is less than 30 mL/hour.; Monitor/assess lab values and report abnormal values; Monitor/assess vitals and hemodynamic parameters with position changes; Monitor and compare daily weight     Problem: Inadequate Gas Exchange  Goal: Adequate oxygenation and improved ventilation  Outcome: Progressing  Flowsheets (Taken 01/04/2019 1821)  Adequate oxygenation and improved ventilation: Assess lung sounds; Position for maximum ventilatory efficiency; Plan activities to conserve energy: plan rest periods; Monitor SpO2 and treat as needed; Teach/reinforce use of incentive spirometer 10 times per hour while awake, cough and deep breath as needed; Monitor and treat ETCO2; Provide mechanical and oxygen support to facilitate gas exchange; Consult/collaborate with Respiratory Therapy; Increase activity as tolerated/progressive mobility    [name]'s learning abilities have been assessed. Today's individualized plan of care to continue hourly rounding, pain management, O2, IV ABX, nebs, and monitor BP and respiratory status was discussed with [name] and she agreed to it. [name] demonstrates understanding of disease process, treatment plan, medications and consequences of noncompliance. All questions and concerns were addressed.  Pt denies chest pain. SOB at rest and on exertion. O2 5L NC. IV ABX given. Tolerated diet. Ambulated to bedside commode with standby assist. PRN meds given for ABD pain.       Fall precaution maintained. Re instructed to call for assistance, wait for help to arrive before getting out of bed. Pt educated about the importance of preventing falls and about proper  safety.  Pt demonstrated understanding of education by repeating back.  Pt's informed of POC/goals. Verbalized understanding of disease process, treatment plan, medication, and consequences of noncompliance. Bed in lowest position. Bed wheels locked. Bed alarm on. Non-skid socks on bilaterally. Floor mats in used. Call light, phone, over bed table and personal belongings in reach. Participate in Hourly rounding. White board updated.

## 2019-01-05 LAB — BASIC METABOLIC PANEL
Anion Gap: 7 (ref 5.0–15.0)
BUN: 14 mg/dL (ref 7.0–19.0)
CO2: 22 mEq/L (ref 22–29)
Calcium: 10.2 mg/dL (ref 8.5–10.5)
Chloride: 111 mEq/L (ref 100–111)
Creatinine: 0.7 mg/dL (ref 0.6–1.0)
Glucose: 118 mg/dL — ABNORMAL HIGH (ref 70–100)
Potassium: 4.9 mEq/L (ref 3.5–5.1)
Sodium: 140 mEq/L (ref 136–145)

## 2019-01-05 LAB — CBC
Absolute NRBC: 0 10*3/uL (ref 0.00–0.00)
Hematocrit: 38.9 % (ref 34.7–43.7)
Hgb: 12.2 g/dL (ref 11.4–14.8)
MCH: 29 pg (ref 25.1–33.5)
MCHC: 31.4 g/dL — ABNORMAL LOW (ref 31.5–35.8)
MCV: 92.4 fL (ref 78.0–96.0)
MPV: 10.4 fL (ref 8.9–12.5)
Nucleated RBC: 0 /100 WBC (ref 0.0–0.0)
Platelets: 324 10*3/uL (ref 142–346)
RBC: 4.21 10*6/uL (ref 3.90–5.10)
RDW: 14 % (ref 11–15)
WBC: 12.12 10*3/uL — ABNORMAL HIGH (ref 3.10–9.50)

## 2019-01-05 LAB — GLUCOSE WHOLE BLOOD - POCT
Whole Blood Glucose POCT: 131 mg/dL — ABNORMAL HIGH (ref 70–100)
Whole Blood Glucose POCT: 148 mg/dL — ABNORMAL HIGH (ref 70–100)
Whole Blood Glucose POCT: 144 mg/dL — ABNORMAL HIGH (ref 70–100)

## 2019-01-05 LAB — GFR: EGFR: >60

## 2019-01-05 LAB — HEMOLYSIS INDEX: Hemolysis Index: 2 (ref 0–18)

## 2019-01-05 MED ORDER — METHYLPREDNISOLONE SODIUM SUCC 40 MG IJ SOLR
20.00 mg | Freq: Three times a day (TID) | INTRAMUSCULAR | Status: DC
Start: 2019-01-05 — End: 2019-01-07
  Administered 2019-01-05 – 2019-01-07 (×4): 20 mg via INTRAVENOUS
  Filled 2019-01-05 (×4): qty 1

## 2019-01-05 NOTE — Progress Notes (Signed)
[facility]  PROGRESS NOTE      Patient: [name]  Date: 01/05/2019   LOS: 3 Days  Admission Date: 01/01/2019   MRN: [id number]  Attending: Adrienne Mocha  Please contact me on [employer]            SUBJECTIVE     Patient seen and examined at bedside with RN. Requiring 4L NC during the day.   Wore bipap overnight. Reports feeling a bit better today, coughing frequently.   Updated on plan of care. Denies chest pain, abdominal pain, nausea, vomiting, diarrhea.        OBJECTIVE     Vitals:    01/05/19 1613   BP: 104/59   Pulse: 90   Resp: 20   Temp: 97.8 F (36.6 C)   SpO2: 94%       Temperature: Temp  Min: 97 F (36.1 C)  Max: 99.2 F (37.3 C)  Pulse: Pulse  Min: 65  Max: 94  Respiratory: Resp  Min: 16  Max: 20  Non-Invasive BP: BP  Min: 100/59  Max: 122/77  Pulse Oximetry SpO2  Min: 93 %  Max: 97 %    Intake and Output Summary (Last 24 hours) at Date Time    Intake/Output Summary (Last 24 hours) at 01/05/2019 1621  Last data filed at 01/05/2019 1400  Gross per 24 hour   Intake 700 ml   Output 800 ml   Net -100 ml       PHYSICAL EXAMINATION  GEN APPEARANCE: chronically ill-appearing in NAD. Alert and cooperative.   Frequently coughing. Appears more comfortable today and less short of breath.    HEENT: NCAT, EOMI, PERRL, no nasal discharge.   conjunctivae/corneas clear. Oral mucosa moist.   Neck: Supple without meningismus   CVS: RRR, S1, S2. No murmur   LUNGS: coarse breath sounds with diffuse wheezes, symmetric air entry. On O2.   ABD: BS+, soft, obese, NT, ND, no guarding or rigidity  EXT: No edema; Pulses 2+ and intact  NEURO: AAOx3, CN 2-12 intact.  No focal neurological deficits  MENTAL STATUS: anxious otherwise appropriate affect and mood      MEDICATIONS     Current Facility-Administered Medications   Medication Dose Route Frequency    albuterol-ipratropium  3 mL Nebulization Q4H SCH    aspirin  81 mg Oral Daily    cefepime  1 g Intravenous Q12H [place]    doxycycline  100 mg Oral Q12H [place]     enoxaparin  0.5 mg/kg Subcutaneous Daily    fluticasone furoate-vilanterol  1 puff Inhalation QAM    gabapentin  800 mg Oral TID    guaiFENesin  600 mg Oral Q12H [place]    insulin lispro  1-3 Units Subcutaneous QHS    insulin lispro  1-5 Units Subcutaneous TID AC    lactobacillus/streptococcus  1 capsule Oral Daily    methylPREDNISolone  40 mg Intravenous Q8H    nicotine  1 patch Transdermal Daily    oxybutynin  5 mg Oral TID    pantoprazole  40 mg Oral QAM AC    risperiDONE  3 mg Oral BID    sertraline  100 mg Oral BID            LABS/IMAGING     Recent Labs   Lab 01/05/19  0253 01/04/19  0449 01/03/19  0319   WBC 12.12* 14.13* 16.80*   RBC 4.21 3.93 4.09   Hgb 12.2 11.7 12.0   Hematocrit 38.9  36.0 37.6   MCV 92.4 91.6 91.9   Platelets 324 303 277       Recent Labs   Lab 01/05/19  0253 01/04/19  0449 01/03/19  0319 01/02/19  0103 01/01/19  2116   Sodium 140 141 139 137 140   Potassium 4.9 4.9 4.6 4.3 4.5   Chloride 111 111 112* 110 112*   CO2 22 22 21* 17* 19*   BUN 14.0 13.0 9.0 9.0 8.0   Creatinine 0.7 0.7 0.7 0.9 0.9   Glucose 118* 128* 129* 155* 109*   Calcium 10.2 10.0 10.2 9.3 9.4       Recent Labs   Lab 01/01/19  2116   ALT 9   AST (SGOT) 18   Bilirubin, Total 0.2   Albumin 2.7*   Alkaline Phosphatase 80       Recent Labs   Lab 01/02/19  0839 01/02/19  0506 01/01/19  2116   Troponin I <0.01 <0.01 <0.01             Microbiology Results     Procedure Component Value Units Date/Time    Legionella antigen, urine [[id number]] Collected:  01/02/19 0120    Specimen:  Urine, Clean Catch Updated:  01/02/19 1146    Narrative:       ORDER#: [id number]                                    ORDERED BY: Gloris Manchester  SOURCE: Urine, Clean Catch                           COLLECTED:  01/02/19 01:20  ANTIBIOTICS AT COLL.:                                RECEIVED :  01/02/19 04:11  Legionella, Rapid Urinary Antigen          FINAL       01/02/19 11:46  01/02/19   Negative for Legionella pneumophila Serogroup 1 Antigen              Limitations of Test:             1. Negative results do not exclude infection with Legionella                pneumophila Serogroup 1.             2. Does not detect other serogroups of L. pneumophila                or other Legionella species.             Test Reference Range: Negative      Rapid influenza A/B antigens [[id number]] Collected:  01/02/19 0104    Specimen:  Nasopharyngeal from Nasal Aspirate Updated:  01/02/19 0138    Narrative:       ORDER#: [id number]                                    ORDERED BY: Gloris Manchester  SOURCE: Nasal Aspirate                               COLLECTED:  01/02/19  01:04  ANTIBIOTICS AT COLL.:                                RECEIVED :  01/02/19 01:19  Influenza Rapid Antigen A&B                FINAL       01/02/19 01:38  01/02/19   Negative for Influenza A and B             Reference Range: Negative      Respiratory Pathogen Panel, PCR (Film Array) [[id number]] Collected:  01/02/19 0104     Updated:  01/02/19 0644     Adenovirus Not Detected     Coronavirus 229E Not Detected     Coronavirus HKU1 Not Detected     Coronavirus NL63 Not Detected     Coronavirus OC43 Not Detected     Human Metapneumovirus Not Detected     Human Rhinovirus/Enterovirus Not Detected     Influenza A Not Detected     Influenza A/H1 Not Detected     Influenza AH1 - 2009 Not Detected     Influenza A/H3 Not Detected     Influenza B Not Detected     Parainfluenza Virus 1 Not Detected     Parainfluenza Virus 2 Not Detected     Parainfluenza Virus 3 Not Detected     Parainfluenza Virus 4 Not Detected     Respiratory Syncytial Virus Not Detected     Bordetella pertussis Not Detected     Chlamydophila pneumoniae Not Detected     Mycoplasma pneumoniae Not Detected     Comment: Multiplex nucleic acid amplification assay for detection of  18 respiratory viruses and bacteria. This assay cannot  differentiate Rhinovirus/Enterovirus. If necessary for  patient care, a positive result for Rhinovirus/Enterovirus  may  be followed-up using an alternate method.  Viral and bacterial nucleic acids may persist even though no  viable organism is present. Detection of nucleic acid does  not imply that the corresponding organisms are infectious or  are the causative agents of clinical symptoms. A negative  result does not exclude the possibility of viral or  bacterial infection. Performance characteristics may vary  with circulating strains. The assay may not be able to  distinguish between existing viral strains and new variants  as they emerge.The performance of this test has not been  established in individuals who received influenza vaccine.  Recent administration of a nasal influenza vaccine may cause  false positive results for Influenza A and/or Influenza B.  This assay is FDA cleared for nasopharyngeal swab samples.  Performance characteristics for Bronchoalveolar lavage  samples have been determined by the [facility] laboratory. Other  sample types are unacceptable.          Source NP Swab             Chest 2 Views    Result Date: 01/01/2019   Patchy bilateral perihilar opacities are nonspecific. Atypical infection cannot be excluded. Clinical correlation and continued follow-up is recommended. [name], MD 01/01/2019 10:01 PM    Ct Angiogram Chest    Result Date: 01/02/2019   1. This is a limited study due to multiple factors including patient motion and suboptimal bolus timing. No large central filling defect within the pulmonary arteries. 2. There is mosaic attenuation and air trapping worsened mid to upper lobe distribution. While this is a nonspecific finding but can  be associated with many disease entities and etiologies, it can be seen with occlusive vascular disease such as chronic thromboembolic disease. Jeanella Cara, MD 01/02/2019 3:19 PM    Xr Chest Ap Portable    Result Date: 01/03/2019   Pulmonary edema pattern without change which may be seen with CHF or multifocal pneumonia Elyn Peers, MD 01/03/2019 9:46 AM    US  Venous Low Extrem Duplx Dopp Comp Bilat    Result Date: 01/03/2019   No ultrasound evidence of DVT in either lower extremity. Graciella Freer, MD 01/03/2019 5:14 PM    Echo  Summary    * Left ventricular ejection fraction is normal with an estimated ejection  fraction of 60-65%.    * The left ventricle is mildly dilated.    * Left ventricular wall thickness is normal.    * Left ventricular diastolic filling parameters demonstrate normal diastolic  function.    * The right ventricular cavity size is dilated.    * Normal right ventricular systolic function.    * Moderate pulmonary hypertension with estimated right ventricular systolic  pressure of  54 mmHg.    * The left atrium is mildly dilated.    * There is no aortic stenosis.    * There is no aortic regurgitation.    * There is mild mitral regurgitation.    * The aortic root is mildly dilated.    * The IVC is mildly dilated to  2.26 cm.    * There is a small pericardial effusion.       ASSESSMENT/PLAN   Patient Active Hospital Problem List:   Multilobar pneumonia    Acute COPD exacerbation   OSA on CPAP qhs    History of CAD    Anxiety            CANDITA BORENSTEIN is a 48 y.o. female with a history of COPD, active tobacco use, CAD/MI, and anxiety presenting with shortness of breath. she was hospitalized for pneumonia 3 months ago in [place].   She is admitted for pneumonia and acute COPD exacerbation. Elevated d-dimer, CTA chest negative for PE but concerning for extensive multilobar pneumonia. venous lower extremity doppler negative for DVT. Lactic acid and procalcitonin normal.  Legionella, influenza, respiratory pathogen panel negative.  Echo shows LVEF 60-65%, moderate pulmonary hypertension. Pulmonology and ID consulted. On cefepime and doxycyline.       #Acute hypoxic respiratory failure   #Multilobar Pneumonia   #Acute COPD exacerbation  -CTA chest negative for PE but concerning for extensive multilobar pneumonia.  -ID following, appreciate recs. Possible  atypical pneumonia. ?pulmonary fibrosis or ILD.   -Pulmonology following, appreciate recs  -continue cefepime and doxycycline   -continue iv solumedrol, taper to 87m q8h   -followup sputum cx, Mycoplasma and Chlamydia antibodies   -continue supplemental oxygen, on 4 lpm. BIPAP qhs   -continue breo, scheduled duonebs   -mucinex, incentive spirometry  -follow cultures   -monitor on telemetry    #CAD  -EKG shows NSR, nonspecific T wave changes. Serial troponins x 3 negative.   -continue aspirin  -continue to monitor on telemetry    #Tobacco use  -advised cessation. Nicotine patch.     #OSA   -on cpap qhs      #Anxiety  -continue Resporal Zoloft and Neurontin    #Overactive bladder  -continue oxybutynin      Code: FULL   DVT prophylaxis: lovenox  Disposition: continues to require inpatient hospitalization.  Signed,  Adrienne Mocha           This chart was generated using hospital voice-recognition software which does not employ spell-checking or grammar-checking features. It was dictated, all or in part, in a busy and often noisy patient care environment. I have taken all usual measures to dictate carefully and to review all aspects this chart. Nonetheless, given the known and well-documented performance characteristics of VR software in such patient care environments, this dictation still may contain unrecognized and wholly unintended errors or omissions

## 2019-01-05 NOTE — Plan of Care (Signed)
Problem: Compromised Hemodynamic Status  Goal: Vital signs and fluid balance maintained/improved  Outcome: Progressing  Flowsheets (Taken 01/05/2019 1439)  Vital signs and fluid balance are maintained/improved: Position patient for maximum circulation/cardiac output; Monitor intake and output. Notify LIP if urine output is less than 30 mL/hour.; Monitor/assess vitals and hemodynamic parameters with position changes; Monitor/assess lab values and report abnormal values; Monitor and compare daily weight     Problem: Inadequate Gas Exchange  Goal: Adequate oxygenation and improved ventilation  Outcome: Progressing  Flowsheets (Taken 01/05/2019 1439)  Adequate oxygenation and improved ventilation: Assess lung sounds; Position for maximum ventilatory efficiency; Plan activities to conserve energy: plan rest periods; Monitor SpO2 and treat as needed; Teach/reinforce use of incentive spirometer 10 times per hour while awake, cough and deep breath as needed; Increase activity as tolerated/progressive mobility; Monitor and treat ETCO2; Provide mechanical and oxygen support to facilitate gas exchange; Consult/collaborate with Respiratory Therapy    [name]'s learning abilities have been assessed. Today's individualized plan of care to continue hourly rounding, pain management, O2, IV ABX, Nebs, and monitor BP and respiratory status was discussed with [name] and she agreed to it. [name] demonstrates understanding of disease process, treatment plan, medications and consequences of noncompliance. All questions and concerns were addressed. Pt denies chest pain. SOB on exertion. O2 5L NC. IV ABX given. PRN meds given for ABD pain due to cough. Ambulated in room with standby assist. Tolerated diet.      Fall precaution maintained. Re instructed to call for assistance, wait for help to arrive before getting out of bed. Pt educated about the importance of preventing falls and about proper safety.  Pt  demonstrated understanding of education by repeating back.  Pt's informed of POC/goals. Verbalized understanding of disease process, treatment plan, medication, and consequences of noncompliance. Bed in lowest position. Bed wheels locked. Bed alarm on. Non-skid socks on bilaterally. Floor mats in used. Call light, phone, over bed table and personal belongings in reach. Participate in Hourly rounding. White board updated.

## 2019-01-05 NOTE — Progress Notes (Signed)
Infectious Diseases & Tropical Medicine  Progress Note    01/05/2019   [name] [id number] is a 48 y.o. female,       Assessment:      Multifocal pneumonia   COPD exacerbation   Respiratory pathogen panel unremarkable   Chest x-ray-pulmonary edema/multifocal pneumonia (01/03/2019)   Procalcitonin level-0.02 (01/02/2019)   History of heavy smoking   Leukocytosis improving-on steroids   Morbid obesity    Plan:      Continue cefepime and doxycycline   Sputum culture pending   Mycoplasma and Chlamydia antibodies pending   Pulmonary following   Follow-up chest x-ray   Continue probiotics   Monitor electrolytes and renal functions closely   Monitor clinically      Discussed with patient in detail     ROS:     General:  no fever, no chills, no rigor, awake and alert, feeling better, appears very comfortable  HEENT: no neck pain, no throat pain  Endocrine:  no fatigue, no night sweats  Respiratory: Cough and shortness of breath improving  Cardiovascular: no chest pain   Gastrointestinal: Complains of abdominal muscle pain secondary to constant coughing,no N/V/D  Genito-Urinary: no dysuria, increased urinary frequency, trouble voiding, or hematuria   Musculoskeletal: no edema, right hip pain improved  Neurological: c/o generalized weakness   Dermatological: no rash, no ulcer    Physical Examination:     Blood pressure 104/59, pulse 82, temperature 98.5 F (36.9 C), temperature source Axillary, resp. rate 20, height 1.753 m (_0 ), weight 145 kg (319 lb 10.7 oz), SpO2 95 %.     General Appearance: Comfortable, and in no acute distress.   HEENT: Pupils are equal, round, and reactive to light.    Lungs: Scattered rhonchi, decreased breath sounds   Heart:  Regular rate and rhythm   Chest: Symmetric chest wall expansion.    Abdomen: soft ,non tender,no hepatosplenomegaly,good bowel sounds   Neurological: No focal deficit   Extremities: No edema    Laboratory And Diagnostic  Studies:     Recent Labs     01/05/19  0253 01/04/19  0449   WBC 12.12* 14.13*   Hgb 12.2 11.7   Hematocrit 38.9 36.0   Platelets 324 303     Recent Labs     01/05/19  0253 01/04/19  0449   Sodium 140 141   Potassium 4.9 4.9   Chloride 111 111   CO2 22 22   BUN 14.0 13.0   Creatinine 0.7 0.7   Glucose 118* 128*   Calcium 10.2 10.0     No results for input(s): AST, ALT, ALKPHOS, PROT, ALB in the last 72 hours.    Current Meds:      Scheduled Meds: PRN Meds:    albuterol-ipratropium, 3 mL, Nebulization, Q4H [facility]  aspirin, 81 mg, Oral, Daily  cefepime, 1 g, Intravenous, Q12H [facility]  doxycycline, 100 mg, Oral, Q12H [facility]  enoxaparin, 0.5 mg/kg, Subcutaneous, Daily  fluticasone furoate-vilanterol, 1 puff, Inhalation, QAM  gabapentin, 800 mg, Oral, TID  guaiFENesin, 600 mg, Oral, Q12H [facility]  insulin lispro, 1-3 Units, Subcutaneous, QHS  insulin lispro, 1-5 Units, Subcutaneous, TID AC  lactobacillus/streptococcus, 1 capsule, Oral, Daily  methylPREDNISolone, 40 mg, Intravenous, Q8H  nicotine, 1 patch, Transdermal, Daily  oxybutynin, 5 mg, Oral, TID  pantoprazole, 40 mg, Oral, QAM AC  risperiDONE, 3 mg, Oral, BID  sertraline, 100 mg, Oral, BID        Continuous Infusions:   acetaminophen, 650 mg, Q4H  PRN    Or  acetaminophen, 650 mg, Q4H PRN  ALPRAZolam, 0.5 mg, BID PRN  benzonatate, 200 mg, TID PRN  dextrose, 15 g of glucose, PRN    And  glucagon (rDNA), 1 mg, PRN    And  dextrose, 125 mL, PRN  diphenhydrAMINE, 25 mg, Q6H PRN  guaiFENesin-dextromethorphan, 5 mL, Q6H PRN  influenza, 0.5 mL, Prior to discharge  naloxone, 0.2 mg, PRN  nitroglycerin, 0.4 mg, Q5 Min PRN  ondansetron, 4 mg, Q6H PRN    Or  ondansetron, 4 mg, Q6H PRN  oxyCODONE-acetaminophen, 2 tablet, Q6H PRN  senna-docusate, 2 tablet, QHS PRN          Monzerat Handler A. Gustavus Messing, M.D.  01/05/2019  8:43 AM

## 2019-01-06 ENCOUNTER — Inpatient Hospital Stay: Payer: Medicaid Other

## 2019-01-06 LAB — CBC
Absolute NRBC: 0 10*3/uL (ref 0.00–0.00)
Hematocrit: 37.4 % (ref 34.7–43.7)
Hgb: 11.9 g/dL (ref 11.4–14.8)
MCH: 29.2 pg (ref 25.1–33.5)
MCHC: 31.8 g/dL (ref 31.5–35.8)
MCV: 91.7 fL (ref 78.0–96.0)
MPV: 10.2 fL (ref 8.9–12.5)
Nucleated RBC: 0 /100 WBC (ref 0.0–0.0)
Platelets: 335 10*3/uL (ref 142–346)
RBC: 4.08 10*6/uL (ref 3.90–5.10)
RDW: 14 % (ref 11–15)
WBC: 16.06 10*3/uL — ABNORMAL HIGH (ref 3.10–9.50)

## 2019-01-06 LAB — BASIC METABOLIC PANEL
Anion Gap: 8 (ref 5.0–15.0)
BUN: 17 mg/dL (ref 7.0–19.0)
CO2: 23 mEq/L (ref 22–29)
Calcium: 9.9 mg/dL (ref 8.5–10.5)
Chloride: 109 mEq/L (ref 100–111)
Creatinine: 0.7 mg/dL (ref 0.6–1.0)
Glucose: 88 mg/dL (ref 70–100)
Potassium: 4.3 mEq/L (ref 3.5–5.1)
Sodium: 140 mEq/L (ref 136–145)

## 2019-01-06 LAB — HEMOLYSIS INDEX: Hemolysis Index: 3 (ref 0–18)

## 2019-01-06 LAB — GLUCOSE WHOLE BLOOD - POCT
Whole Blood Glucose POCT: 104 mg/dL — ABNORMAL HIGH (ref 70–100)
Whole Blood Glucose POCT: 124 mg/dL — ABNORMAL HIGH (ref 70–100)

## 2019-01-06 LAB — MYCOPLASMA PNEUMONIAE AB (IGG,IGM),EIA
Mycoplasma pneumoniae Antibody, IgG: POSITIVE
Mycoplasma pneumoniae Antibody, IgM: NEGATIVE

## 2019-01-06 LAB — GFR: EGFR: >60

## 2019-01-06 NOTE — Progress Notes (Signed)
PULMONARY PROGRESS NOTE                                                                                                              [phone]    Date Time: 01/06/19 6:21 PM  Patient Name: [name], [name] 48 y.o. female admitted with <principal problem not specified>  Admit Date: 01/01/2019    Patient status: Inpatient  Hospital Day: 4           Assessment:   Multilobar pneumonia  COPD exacerbation  Morbid obesity  Obstructive sleep apnea  Coronary artery disease  Hyperglycemia  Anxiety disorder      Plan:       Continue antibiotics patient is on meropenem  Bronchodilators  IV corticosteroid  Inhaled corticosteroid  Nocturnal BiPAP  Follow chest x-ray      Reason for contd. Hospitalization = management of pneumonia and COPD exacerbation  Subjective:       Patient is requiring supplemental oxygen has mild to moderate shortness of breath            Medications:     Current Facility-Administered Medications   Medication Dose Route Frequency    albuterol-ipratropium  3 mL Nebulization Q4H SCH    aspirin  81 mg Oral Daily    doxycycline  100 mg Oral Q12H SCH    enoxaparin  0.5 mg/kg Subcutaneous Daily    fluticasone furoate-vilanterol  1 puff Inhalation QAM    gabapentin  800 mg Oral TID    guaiFENesin  600 mg Oral Q12H [place]    insulin lispro  1-3 Units Subcutaneous QHS    insulin lispro  1-5 Units Subcutaneous TID AC    lactobacillus/streptococcus  1 capsule Oral Daily    methylPREDNISolone  20 mg Intravenous Q8H    nicotine  1 patch Transdermal Daily    oxybutynin  5 mg Oral TID    pantoprazole  40 mg Oral QAM AC    risperiDONE  3 mg Oral BID    sertraline  100 mg Oral BID       Review of Systems:        General ROS:  Afebrile    ENT ROS:  No sore throat no nasal discharge   Endocrine ROS:   fatigue   Respiratory ROS:  shortness of breath wheezing cough chest congestion    Cardiovascular ROS:  No chest pain or palpitation   Gastrointestinal ROS:  No nausea vomiting diarrhea.  No melanotic  stool   Genito-Urinary ROS:  No burning in the urine or hematuria   Musculoskeletal ROS:  No musculoskeletal deformities   Neurological ROS:  No stroke seizure disorder   Dermatological ROS:  No skin rash        Physical Exam:     Vitals:    01/06/19 1500   BP: (!) 89/58   Pulse: 83   Resp: 17   Temp: 98.7 F (37.1 C)   SpO2: 97%         Intake/Output Summary (Last 24 hours) at 01/06/2019  [place] filed at 01/06/2019 1117  Gross per 24 hour   Intake 240 ml   Output 1000 ml   Net -760 ml           General appearance - no visible respiratory distress patient does not appear toxic  Mental status -  Alert and oriented x3  Eyes - PERRLA  Nose - no nasal discharge  Mouth - mucous membrane is moist.  No evidence of sore throat  Neck - no JVD lymphadenopathy or thyromegaly  Chest -coarse breath sounds bilaterally with scattered wheezing  Heart - S1-S2 RRR no S3-S4 no murmur  Abdomen - soft nontender bowel sounds are normal with no hepatosplenomegaly  Neurological - no motor or sensory deficit  Extremities - no edema clubbing or cyanosis  Skin - no skin rash      Labs:     CBC w/Diff CMP   Recent Labs   Lab 01/06/19  0326 01/05/19  0253 01/04/19  0449  01/01/19  2116   WBC 16.06* 12.12* 14.13*  More results in Results Review 12.59*   Hgb 11.9 12.2 11.7  More results in Results Review 12.0   Hematocrit 37.4 38.9 36.0  More results in Results Review 38.0   Platelets 335 324 303  More results in Results Review 248   MCV 91.7 92.4 91.6  More results in Results Review 91.6   Neutrophils  --   --   --   --  76.4   More results in Results Review = values in this interval not displayed.       PT/INR         Recent Labs   Lab 01/06/19  0326 01/05/19  0253 01/04/19  0449  01/01/19  2116   Sodium 140 140 141  More results in Results Review 140   Potassium 4.3 4.9 4.9  More results in Results Review 4.5   Chloride 109 111 111  More results in Results Review 112*   CO2 _0 More results in Results Review 19*   BUN 17.0  14.0 13.0  More results in Results Review 8.0   Creatinine 0.7 0.7 0.7  More results in Results Review 0.9   Glucose 88 118* 128*  More results in Results Review 109*   Calcium 9.9 10.2 10.0  More results in Results Review 9.4   Protein, Total  --   --   --   --  5.4*   Albumin  --   --   --   --  2.7*   AST (SGOT)  --   --   --   --  18   ALT  --   --   --   --  9   Alkaline Phosphatase  --   --   --   --  80   Bilirubin, Total  --   --   --   --  0.2   More results in Results Review = values in this interval not displayed.      Glucose POCT   Recent Labs   Lab 01/06/19  0326 01/05/19  0253 01/04/19  0449 01/03/19  0319 01/02/19  0103 01/01/19  2116   Glucose 88 118* 128* 129* 155* 109*        Recent Labs   Lab 01/02/19  0839 01/02/19  0506 01/01/19  2116   Troponin I <0.01 <0.01 <0.01         ABGs:  ABG CollectionSite   Date Value Ref Range Status   01/02/2019 Left Radl  Final     Allen's Test   Date Value Ref Range Status   01/02/2019 Yes  Final     pH, Arterial   Date Value Ref Range Status   01/02/2019 7.312 (L) 7.350 - 7.450 Final     pCO2, Arterial   Date Value Ref Range Status   01/02/2019 34.6 (L) 35.0 - 45.0 mmHg Final     pO2, Arterial   Date Value Ref Range Status   01/02/2019 85.5 80.0 - 90.0 mmHg Final     HCO3, Arterial   Date Value Ref Range Status   01/02/2019 17.0 (L) 23.0 - 29.0 mEq/L Final     Base Excess, Arterial   Date Value Ref Range Status   01/02/2019 -7.9 (L) -2.0 - 2.0 mEq/L Final     O2 Sat, Arterial   Date Value Ref Range Status   01/02/2019 96.4 95.0 - 100.0 % Final       Urinalysis        Invalid input(s): LEUKOCYTESUR      Rads:   Xr Chest Ap Portable    Result Date: 01/06/2019   Stable diffuse lung disease Elyn Peers, MD 01/06/2019 12:10 PM      Quillian Quince, MD  01/06/2019  6:21 PM

## 2019-01-06 NOTE — OT Eval Note (Signed)
[facility]   Occupational Therapy Attempt Note    Patient:  [name] MRN#:  [id number]  Unit:  [place] Room/Bed:  R2074/S9796-41      Occupational Therapy evaluation attempted on 01/06/2019 at 9:08 AM unable to complete secondary to pt receiving breathing treatment.  Will follow up with pt at a later time.     RN aware.        Ronnie Derby, OTR/L  [facility] [phone]  [phone]    01/06/2019  9:09 AM

## 2019-01-06 NOTE — Progress Notes (Signed)
[facility]  PROGRESS NOTE      Patient: [name]  Date: 01/06/2019   LOS: 4 Days  Admission Date: 01/01/2019   MRN: [id number]  Attending: Adrienne Mocha  Please contact me on Perfect[employer]            SUBJECTIVE     Patient seen and examined at bedside. D/w RN. Currently on 4L NC. Wore bipap overnight.   Reports feeling a bit better today. Updated on plan of care.   Denies chest pain, abdominal pain, nausea, vomiting, diarrhea.        OBJECTIVE     Vitals:    01/06/19 0855   BP:    Pulse:    Resp:    Temp:    SpO2: 97%       Temperature: Temp  Min: 97 F (36.1 C)  Max: 97.8 F (36.6 C)  Pulse: Pulse  Min: 70  Max: 90  Respiratory: Resp  Min: 14  Max: 20  Non-Invasive BP: BP  Min: 87/61  Max: 104/59  Pulse Oximetry SpO2  Min: 94 %  Max: 97 %    Intake and Output Summary (Last 24 hours) at Date Time    Intake/Output Summary (Last 24 hours) at 01/06/2019 1232  Last data filed at 01/06/2019 0500  Gross per 24 hour   Intake 676 ml   Output    Net 676 ml       PHYSICAL EXAMINATION  GEN APPEARANCE: chronically ill-appearing in NAD. Alert and cooperative.   Occasionally coughing. Appears more comfortable today and less short of breath.    HEENT: NCAT, EOMI, PERRL, no nasal discharge.   conjunctivae/corneas clear. Oral mucosa moist.   Neck: Supple without meningismus   CVS: RRR, S1, S2. No murmur   LUNGS: coarse breath sounds with diffuse wheezes, symmetric air entry. On O2.   ABD: BS+, soft, obese, NT, ND, no guarding or rigidity  EXT: No edema; Pulses 2+ and intact  NEURO: AAOx3, CN 2-12 intact.  No focal neurological deficits  MENTAL STATUS: anxious otherwise appropriate affect and mood      MEDICATIONS     Current Facility-Administered Medications   Medication Dose Route Frequency    albuterol-ipratropium  3 mL Nebulization Q4H SCH    aspirin  81 mg Oral Daily    cefepime  1 g Intravenous Q12H [place]    doxycycline  100 mg Oral Q12H [place]    enoxaparin  0.5 mg/kg Subcutaneous Daily    fluticasone  furoate-vilanterol  1 puff Inhalation QAM    gabapentin  800 mg Oral TID    guaiFENesin  600 mg Oral Q12H [place]    insulin lispro  1-3 Units Subcutaneous QHS    insulin lispro  1-5 Units Subcutaneous TID AC    lactobacillus/streptococcus  1 capsule Oral Daily    methylPREDNISolone  20 mg Intravenous Q8H    nicotine  1 patch Transdermal Daily    oxybutynin  5 mg Oral TID    pantoprazole  40 mg Oral QAM AC    risperiDONE  3 mg Oral BID    sertraline  100 mg Oral BID            LABS/IMAGING     Recent Labs   Lab 01/06/19  0326 01/05/19  0253 01/04/19  0449   WBC 16.06* 12.12* 14.13*   RBC 4.08 4.21 3.93   Hgb 11.9 12.2 11.7   Hematocrit 37.4 38.9 36.0   MCV 91.7 92.4 91.6  Platelets 335 324 303       Recent Labs   Lab 01/06/19  0326 01/05/19  0253 01/04/19  0449 01/03/19  0319 01/02/19  0103   Sodium 140 140 141 139 137   Potassium 4.3 4.9 4.9 4.6 4.3   Chloride 109 111 111 112* 110   CO2 _0 21* 17*   BUN 17.0 14.0 13.0 9.0 9.0   Creatinine 0.7 0.7 0.7 0.7 0.9   Glucose 88 118* 128* 129* 155*   Calcium 9.9 10.2 10.0 10.2 9.3       Recent Labs   Lab 01/01/19  2116   ALT 9   AST (SGOT) 18   Bilirubin, Total 0.2   Albumin 2.7*   Alkaline Phosphatase 80       Recent Labs   Lab 01/02/19  0839 01/02/19  0506 01/01/19  2116   Troponin I <0.01 <0.01 <0.01             Microbiology Results     Procedure Component Value Units Date/Time    Legionella antigen, urine [[id number]] Collected:  01/02/19 0120    Specimen:  Urine, Clean Catch Updated:  01/02/19 1146    Narrative:       ORDER#: [id number]                                    ORDERED BY: Gloris Manchester  SOURCE: Urine, Clean Catch                           COLLECTED:  01/02/19 01:20  ANTIBIOTICS AT COLL.:                                RECEIVED :  01/02/19 04:11  Legionella, Rapid Urinary Antigen          FINAL       01/02/19 11:46  01/02/19   Negative for Legionella pneumophila Serogroup 1 Antigen             Limitations of Test:             1. Negative  results do not exclude infection with Legionella                pneumophila Serogroup 1.             2. Does not detect other serogroups of L. pneumophila                or other Legionella species.             Test Reference Range: Negative      Rapid influenza A/B antigens [[id number]] Collected:  01/02/19 0104    Specimen:  Nasopharyngeal from Nasal Aspirate Updated:  01/02/19 0138    Narrative:       ORDER#: [id number]                                    ORDERED BY: Gloris Manchester  SOURCE: Nasal Aspirate                               COLLECTED:  01/02/19 01:04  ANTIBIOTICS AT COLL.:  RECEIVED :  01/02/19 01:19  Influenza Rapid Antigen A&B                FINAL       01/02/19 01:38  01/02/19   Negative for Influenza A and B             Reference Range: Negative      Respiratory Pathogen Panel, PCR (Film Array) [[id number]] Collected:  01/02/19 0104     Updated:  01/02/19 0644     Adenovirus Not Detected     Coronavirus 229E Not Detected     Coronavirus HKU1 Not Detected     Coronavirus NL63 Not Detected     Coronavirus OC43 Not Detected     Human Metapneumovirus Not Detected     Human Rhinovirus/Enterovirus Not Detected     Influenza A Not Detected     Influenza A/H1 Not Detected     Influenza AH1 - 2009 Not Detected     Influenza A/H3 Not Detected     Influenza B Not Detected     Parainfluenza Virus 1 Not Detected     Parainfluenza Virus 2 Not Detected     Parainfluenza Virus 3 Not Detected     Parainfluenza Virus 4 Not Detected     Respiratory Syncytial Virus Not Detected     Bordetella pertussis Not Detected     Chlamydophila pneumoniae Not Detected     Mycoplasma pneumoniae Not Detected     Comment: Multiplex nucleic acid amplification assay for detection of  18 respiratory viruses and bacteria. This assay cannot  differentiate Rhinovirus/Enterovirus. If necessary for  patient care, a positive result for Rhinovirus/Enterovirus  may be followed-up using an alternate method.  Viral and  bacterial nucleic acids may persist even though no  viable organism is present. Detection of nucleic acid does  not imply that the corresponding organisms are infectious or  are the causative agents of clinical symptoms. A negative  result does not exclude the possibility of viral or  bacterial infection. Performance characteristics may vary  with circulating strains. The assay may not be able to  distinguish between existing viral strains and new variants  as they emerge.The performance of this test has not been  established in individuals who received influenza vaccine.  Recent administration of a nasal influenza vaccine may cause  false positive results for Influenza A and/or Influenza B.  This assay is FDA cleared for nasopharyngeal swab samples.  Performance characteristics for Bronchoalveolar lavage  samples have been determined by the [facility] laboratory. Other  sample types are unacceptable.          Source NP Swab             Chest 2 Views    Result Date: 01/01/2019   Patchy bilateral perihilar opacities are nonspecific. Atypical infection cannot be excluded. Clinical correlation and continued follow-up is recommended. [name], MD 01/01/2019 10:01 PM    Ct Angiogram Chest    Result Date: 01/02/2019   1. This is a limited study due to multiple factors including patient motion and suboptimal bolus timing. No large central filling defect within the pulmonary arteries. 2. There is mosaic attenuation and air trapping worsened mid to upper lobe distribution. While this is a nonspecific finding but can be associated with many disease entities and etiologies, it can be seen with occlusive vascular disease such as chronic thromboembolic disease. Jeanella Cara, MD 01/02/2019 3:19 PM    Xr Chest Ap Portable  Result Date: 01/06/2019   Stable diffuse lung disease Elyn Peers, MD 01/06/2019 12:10 PM    Xr Chest Ap Portable    Result Date: 01/03/2019   Pulmonary edema pattern without change which may be seen with CHF or  multifocal pneumonia Elyn Peers, MD 01/03/2019 9:46 AM    US Venous Low Extrem Duplx Dopp Comp Bilat    Result Date: 01/03/2019   No ultrasound evidence of DVT in either lower extremity. Graciella Freer, MD 01/03/2019 5:14 PM    Echo  Summary    * Left ventricular ejection fraction is normal with an estimated ejection  fraction of 60-65%.    * The left ventricle is mildly dilated.    * Left ventricular wall thickness is normal.    * Left ventricular diastolic filling parameters demonstrate normal diastolic  function.    * The right ventricular cavity size is dilated.    * Normal right ventricular systolic function.    * Moderate pulmonary hypertension with estimated right ventricular systolic  pressure of  54 mmHg.    * The left atrium is mildly dilated.    * There is no aortic stenosis.    * There is no aortic regurgitation.    * There is mild mitral regurgitation.    * The aortic root is mildly dilated.    * The IVC is mildly dilated to  2.26 cm.    * There is a small pericardial effusion.       ASSESSMENT/PLAN   Patient Active Hospital Problem List:   Multilobar pneumonia    Acute COPD exacerbation   OSA on CPAP qhs    History of CAD    Anxiety            [name] is a 48 y.o. female with a history of COPD, active tobacco use, CAD/MI, and anxiety presenting with shortness of breath. she was hospitalized for pneumonia 3 months ago in [place].   She is admitted for pneumonia and acute COPD exacerbation. Elevated d-dimer, CTA chest negative for PE but concerning for extensive multilobar pneumonia. venous lower extremity doppler negative for DVT. Lactic acid and procalcitonin normal.  Legionella, influenza, respiratory pathogen panel negative.  Echo shows LVEF 60-65%, moderate pulmonary hypertension. Pulmonology and ID consulted. On cefepime and doxycyline.       #Acute hypoxic respiratory failure   #Multilobar Pneumonia   #Acute COPD exacerbation  -CTA chest negative for PE but concerning for extensive  multilobar pneumonia.  -ID following, appreciate recs. Possible atypical pneumonia. ?pulmonary fibrosis or ILD.   -Pulmonology following, appreciate recs  -continue cefepime and doxycycline   -continue iv solumedrol, tapered to 17m q8h   -followup sputum cx, Mycoplasma and Chlamydia antibodies   -continue supplemental oxygen, on 4 lpm. BIPAP qhs   -continue breo, scheduled duonebs   -mucinex, incentive spirometry  -follow cultures   -monitor on telemetry    #CAD  -EKG shows NSR, nonspecific T wave changes. Serial troponins x 3 negative.   -continue aspirin  -continue to monitor on telemetry    #Tobacco use  -advised cessation. Nicotine patch.     #OSA   -on cpap qhs      #Anxiety  -continue Resporal Zoloft and Neurontin    #Overactive bladder  -continue oxybutynin      Code: FULL   DVT prophylaxis: lovenox  Disposition: continues to require inpatient hospitalization.           Signed,  FAdrienne Mocha  This chart was generated using hospital voice-recognition software which does not employ spell-checking or grammar-checking features. It was dictated, all or in part, in a busy and often noisy patient care environment. I have taken all usual measures to dictate carefully and to review all aspects this chart. Nonetheless, given the known and well-documented performance characteristics of VR software in such patient care environments, this dictation still may contain unrecognized and wholly unintended errors or omissions

## 2019-01-06 NOTE — Plan of Care (Signed)
Discharge Recommendation: Home with supervision, Home with home health OT, Home with home health PT   DME Recommended for Discharge: Reacher, Dressing stick, Long-handled sponge, Sock aid, Long-handled shoehorn, Grab bars, BSC    OT Frequency Recommended: 1-2x/wk     Next Visit Recommendation: OT - Next Visit Recommendation: 01/08/19       Is PT evaluation indicated at this time? Yes, a PT evaluation is indicated at this time.    PMP Activity: Step 7 - Walks out of Room  Distance Walked (ft) (Step 6,7): 40 Feet  (Please See Therapy Evaluation for device and assistance level needed)    Treatment Interventions: ADL retraining, Functional transfer training, Endurance training, Patient/Family training, Equipment eval/education, Compensatory technique education       Goal Formulation: Patient  Time For Goal Achievement: by time of discharge  ADL Goals  Patient will groom self: Independent, at sinkside, by time of discharge  Patient will dress lower body: Minimal Assist, with AE, by time of discharge  Patient will toilet: Independent, by time of discharge  Mobility and Transfer Goals  Pt will transfer bed to toilet: Modified Independent, with rolling walker, by time of discharge        Executive Fucntion Goals  Pt will follow energy conservation techniques: with verbalization of 3 ECTs to be used during ADLs, by time of discharge            Iva Lento & W [phone]  [phone]    01/06/2019  11:09 AM

## 2019-01-06 NOTE — Progress Notes (Signed)
Infectious Diseases & Tropical Medicine  Progress Note    01/06/2019   Lavette Yankovich RFF:[id number],TSV:[id number] is a 48 y.o. female,       Assessment:      Multifocal pneumonia   COPD exacerbation   Sputum culture no growth to date   Chest x-ray-extensive alveolar consolidation in both lungs bilaterally likely pulmonary edema (01/06/2019)   Procalcitonin level-0.02 (01/02/2019)   History of heavy smoking   Leukocytosis-on steroids   Morbid obesity    Plan:      Discontinue cefepime   Continue doxycycline   Pulmonary following   Follow-up chest x-ray   Continue probiotics   Monitor electrolytes and renal functions closely   Monitor clinically      Discussed with patient in detail     ROS:     General:  no fever, no chills, no rigor, comfortable  HEENT: no neck pain, no throat pain  Endocrine:  no fatigue, no night sweats  Respiratory: Cough and shortness of breath improving  Cardiovascular: no chest pain   Gastrointestinal: Complains of left lower abdominal muscle pain,no N/V/D  Genito-Urinary: no dysuria, increased urinary frequency, trouble voiding, or hematuria   Musculoskeletal: no edema, right hip pain improved  Neurological: c/o generalized weakness   Dermatological: no rash, no ulcer    Physical Examination:     Blood pressure 93/60, pulse 70, temperature 97.3 F (36.3 C), temperature source Oral, resp. rate 14, height 1.753 m (_0 ), weight 145.6 kg (320 lb 15.8 oz), SpO2 96 %.     General Appearance: Comfortable, and in no acute distress.   HEENT: Pupils are equal, round, and reactive to light.    Lungs: Decreased breath sounds   Heart:  Regular rate and rhythm   Chest: Symmetric chest wall expansion.    Abdomen: soft ,non tender,no hepatosplenomegaly,good bowel sounds   Neurological: No focal deficit   Extremities: No edema    Laboratory And Diagnostic Studies:     Recent Labs     01/06/19  0326 01/05/19  0253   WBC 16.06* 12.12*   Hgb 11.9 12.2   Hematocrit 37.4 38.9    Platelets 335 324     Recent Labs     01/06/19  0326 01/05/19  0253   Sodium 140 140   Potassium 4.3 4.9   Chloride 109 111   CO2 23 22   BUN 17.0 14.0   Creatinine 0.7 0.7   Glucose 88 118*   Calcium 9.9 10.2     No results for input(s): AST, ALT, ALKPHOS, PROT, ALB in the last 72 hours.    Current Meds:      Scheduled Meds: PRN Meds:    albuterol-ipratropium, 3 mL, Nebulization, Q4H SCH  aspirin, 81 mg, Oral, Daily  cefepime, 1 g, Intravenous, Q12H [place]  doxycycline, 100 mg, Oral, Q12H [place]  enoxaparin, 0.5 mg/kg, Subcutaneous, Daily  fluticasone furoate-vilanterol, 1 puff, Inhalation, QAM  gabapentin, 800 mg, Oral, TID  guaiFENesin, 600 mg, Oral, Q12H [place]  insulin lispro, 1-3 Units, Subcutaneous, QHS  insulin lispro, 1-5 Units, Subcutaneous, TID AC  lactobacillus/streptococcus, 1 capsule, Oral, Daily  methylPREDNISolone, 20 mg, Intravenous, Q8H  nicotine, 1 patch, Transdermal, Daily  oxybutynin, 5 mg, Oral, TID  pantoprazole, 40 mg, Oral, QAM AC  risperiDONE, 3 mg, Oral, BID  sertraline, 100 mg, Oral, BID        Continuous Infusions:   acetaminophen, 650 mg, Q4H PRN    Or  acetaminophen, 650 mg, Q4H PRN  ALPRAZolam,  0.5 mg, BID PRN  benzonatate, 200 mg, TID PRN  dextrose, 15 g of glucose, PRN    And  glucagon (rDNA), 1 mg, PRN    And  dextrose, 125 mL, PRN  diphenhydrAMINE, 25 mg, Q6H PRN  guaiFENesin-dextromethorphan, 5 mL, Q6H PRN  influenza, 0.5 mL, Prior to discharge  naloxone, 0.2 mg, PRN  nitroglycerin, 0.4 mg, Q5 Min PRN  ondansetron, 4 mg, Q6H PRN    Or  ondansetron, 4 mg, Q6H PRN  oxyCODONE-acetaminophen, 2 tablet, Q6H PRN  senna-docusate, 2 tablet, QHS PRN          Keeghan Bialy A. Gustavus Messing, M.D.  01/06/2019  9:20 AM

## 2019-01-06 NOTE — OT Eval Note (Signed)
[facility]  Occupational Therapy Evaluation and Treatment    Patient: [name]  MRN#: [id number]   Unit: [facility]  Bed: A2515/A2515-01    Time of Evaluation and Treatment:  Time Calculation  OT Received On: 01/06/19  Start Time: 1022  Stop Time: 1051  Time Calculation (min): 29 min    Chart Review and Collaboration with Care Team: 5 minutes, not included in above time.    Evaluation: 14 minutes  Treatment:  15 minutes    OT Visit Number: 1    Consult received for [name] for OT Evaluation and Treatment.  Patients medical condition is appropriate for Occupational therapy intervention at this time.    Activity Orders:  OT eval and treat and progressive mobility protocol    Precautions and Contraindications:  Precautions  Weight Bearing Status: no restrictions  Other Precautions: Falls    Medical Diagnosis:  SOB (shortness of breath) [R06.02]  History of COPD [Z87.09]    History of Present Illness:  [name] is a 48 y.o. female admitted on 01/01/2019 with worsening chest pressure and SOB. Admitted with multilobar pneumonia and acute COPD exacerbation.    Patient Active Problem List   Diagnosis    SOB (shortness of breath)        Past Medical/Surgical History:  Past Medical History:   Diagnosis Date    Anxiety     Chronic obstructive pulmonary disease     Myocardial infarction     Panic attacks      Past Surgical History:   Procedure Laterality Date    APPENDECTOMY      ARTHROSCOPIC KNEE, ACL RECONSTRUCTION      R. knee     CHOLECYSTECTOMY          X-Rays/Tests/Labs  Lab Results   Component Value Date/Time    HGB 11.9 01/06/2019 03:26 AM    HCT 37.4 01/06/2019 03:26 AM    K 4.3 01/06/2019 03:26 AM    NA 140 01/06/2019 03:26 AM    TROPI <0.01 01/02/2019 08:39 AM    TROPI <0.01 01/02/2019 05:06 AM    TROPI <0.01 01/01/2019 09:16 PM       All imaging reviewed. Please see chart for details      Social History:  Prior Level of Function  Prior level of  function: Independent with ADLs, Ambulates with assistive device  Assistive Device: Four wheel walker  Baseline Activity Level: Household ambulation  Driving: does not drive  Dressing - Upper Body: independent  Dressing - Lower Body: independent  Feeding: independent  Bathing: independent  Grooming: independent  Toileting: independent  DME Currently at Home: ADL- Civil engineer, contracting, Environmental consultant, Four Wheel(O2 tank - doesn't need O2 at baseline)    Home Living Arrangements  Living Arrangements: Spouse/significant other, Other (Comment)(Boyfriend and boyfriend's mother)  Type of Home: [place]: One level(3rd floor apartment, no elevator)  Bathroom Shower/Tub: Administrator, arts  DME Currently at Home: ADL- Civil engineer, contracting, Environmental consultant, Four Wheel(O2 tank - doesn't need O2 at baseline)  [place] - Notes / Comments: Pt reports someone is typically home with her.      Subjective:  Subjective: "That went better than I thought I would."   Patient is agreeable to participation in the therapy session. Nursing clears patient for therapy.   Patient Goal: Improve breathing.  Pain Assessment  Pain Assessment: Numeric Scale (0-10)  Pain Score: (Moderate pain  with movement)  Pain Location: Abdomen(Reports from coughing)  Pain Intervention(s): Ambulation/increased activity, Distraction, Emotional support      Objective:      Observation of Patient/Vital Signs:BP: 96/58, O2: 96% on 5L O2 via NC, HR: 81.  Following activity: O2: 96%  Patient received in bed with telemetry  and O2 at 5 liters/minute via NC in place.    Cognitive Status and Neuro Exam:  Cognition/Neuro Status  Arousal/Alertness: Appropriate responses to stimuli  Attention Span: Appears intact  Orientation Level: Oriented X4  Memory: Appears intact  Following Commands: independent  Safety Awareness: minimal verbal instruction  Insights: Fully aware of deficits  Behavior: calm, cooperative  Motor Planning:  intact  Coordination: intact    Musculoskeletal Examination  Gross ROM  Right Upper Extremity ROM: within functional limits  Left Upper Extremity ROM: within functional limits  Right Lower Extremity ROM: within functional limits  Left Lower Extremity ROM: within functional limits  Gross Strength  Right Upper Extremity Strength: within functional limits  Left Upper Extremity Strength: within functional limits  Right Lower Extremity Strength: within functional limits  Left Lower Extremity Strength: within functional limits       Sensory/Oculomotor Examination  Sensory  Auditory: intact         Activities of Daily Living  Self-care and Home Management  LB Dressing: Dependent, sitting, edge of bed, Don/doff R sock, Don/doff L sock  Toileting: Supervision, bedside commode  Functional Transfers: Stand by Assist, commode transfer    Functional Mobility:  Mobility and Transfers  Supine to Sit: Supervision  Sit to Supine: Minimal Assist(LLE management)  Sit to Stand: Stand by Assist  Bed to [facility]: Stand by Assist  Functional Mobility/Ambulation: Stand by Assist(with bariatric rolling walker, 40 feet)     Balance  Balance  Static Sitting Balance: Independent  Static Standing Balance: Supervision  Dynamic Standing Balance: Stand by Assist    Participation and Activity Tolerance  Participation and Endurance  Participation Effort: excellent  Endurance: Tolerates < 10 min exercise, no significant change in vital signs    Educated the patient to role of occupational therapy, plan of care, goals of therapy and safety with mobility and ADLs, energy conservation techniques, pursed lip breathing, discharge instructions, home safety.    Patient left in bed with oxygen in place and call bell and all personal items/needs within reach. RN notified of session outcome.       Assessment:  [name] is a 48 y.o. female admitted 01/01/2019. Patient would benefit from continued skilled OT to address deficits listed below and increase  functional independence.      A Brief chart review was completed including review of labs, review of imaging and review of vitals.  There are few comorbidities or other factors that affect plan of care and require modification of task including: anxiety, COPD, personal factors including:, assistive device at baseline, challenging home environment .  Pt demonstrates performance deficits with bathing, dressing, toileting, functional mobility and functional transfers.  Pt's ability to complete ADLs and functional transfers is impaired due to the following deficits:  activity tolerance/endurance.     Assessment: decreased independence with ADLs;decreased independence with IADLs;decreased endurance/activity tolerance    Complexity Chart  Review Performance  Deficits Clinical Decision  Making Hx/Co-  morbidities Assistance needed   Moderate Expanded 3-5 Several Options 1-2 Min/Mod assist (not at baseline)       Treatment:  - OT facilitated supine <> sit, sit <> stand, functional mobility, and bedside commode  transfer.  Instruction provided throughout for safe walker use and fall prevention.  Pt demonstrated good balance; SOB with activity.  - OT facilitated ADL task of toileting routine and discussed LB dressing with use of AE.  Pt expressed mild interest in equipment, but reports she likely cannot purchase equipment for financial reasons.  Pt would benefit from additional AE training.  - Provided education on pursed lip breathing and energy conservation techniques to incorporate into daily routine.  - Discussed discharge plan.  Pt open to HHOT/HHPT.  Pt would benefit from home OT eval to ensure safe transition into home environment and to provide any additional recommendations/modifications to home and routine.    Rehabilitation Potential: Prognosis: Good, With home health nursing/aide, With continued OT s/p acute discharge, With family    Plan:  OT Frequency Recommended: 1-2x/wk   Treatment Interventions: ADL  retraining, Functional transfer training, Endurance training, Patient/Family training, Equipment eval/education, Compensatory technique education     PMP Activity: Step 7 - Walks out of Room  Distance Walked (ft) (Step 6,7): 40 Feet    Risks/benefits/POC discussed    AM-PAC:yes  OT Daily Activity Raw Score: 18  CMS 0-100% Score: 46.65%             Goals:  Time For Goal Achievement: by time of discharge  ADL Goals  Patient will groom self: Independent, at sinkside, by time of discharge  Patient will dress lower body: Minimal Assist, with AE, by time of discharge  Patient will toilet: Independent, by time of discharge  Mobility and Transfer Goals  Pt will transfer bed to toilet: Modified Independent, with rolling walker, by time of discharge        Executive Fucntion Goals  Pt will follow energy conservation techniques: with verbalization of 3 ECTs to be used during ADLs, by time of discharge                    DME Recommended for Discharge: Reacher, Dressing stick, Long-handled sponge, Sock aid, Long-handled shoehorn, Grab bars, BSC  Discharge Recommendation: Home with supervision, Home with home health OT, Home with home health PT      Iva Lento & W [phone]  [phone]    01/06/2019  11:02 AM

## 2019-01-06 NOTE — Plan of Care (Signed)
Pain medication given x2 with relief of symptoms. She is in no acute distress, continues to have cough which is less than in the morning. Respiratory treatments and antibiotics continued plan of care discussed and patient is aware and accepting.

## 2019-01-06 NOTE — Plan of Care (Signed)
Problem: Safety  Goal: Patient will be free from infection during hospitalization  Outcome: Progressing  Assisted to the bsc     Problem: Pain  Goal: Pain at adequate level as identified by patient  Outcome: Progressing  Medicated once for pain oxycodone 2 po given     Problem: Side Effects from Pain Analgesia  Goal: Patient will experience minimal side effects of analgesic therapy  Outcome: Progressing  Assess for side effects of pain meds     Problem: Inadequate Gas Exchange  Goal: Adequate oxygenation and improved ventilation  Outcome: Progressing  On 2 L 02 nc, bi-pap at night, on scheduled neb treatment.

## 2019-01-06 NOTE — Progress Notes (Signed)
Situation Pt is admitted from home due to PNA and acute COPD exac.     Background Pt lives w/ her boyfriend and boyfriend's mother.  Pt has good support at home.  Pt has rollator and shower chair at home.  Pt has home O2 that she uses prn. Pt forgot her O2 supplier.  Pt is receptive to [facility] services.     Assessment O2 NC. Duoneb. IV abx. IV solumedrol. WBC 16.06. PT recs HH PT/OT/BSC.     Recommendation DCP is home w/ HH PT/OT/SN w/ BSC and pt may need assistance w/ transportation.          01/06/19 1142   Patient Type   Within 30 Days of Previous Admission? No   Healthcare Decisions   Interviewed: Patient   Orientation/Decision Making Abilities of Patient Alert and Oriented x3, able to make decisions   Advance Directive Patient does not have advance directive   Healthcare Agent Appointed Other (Comment)  Sunnie Nielsen, mother, [phone])   (RETIRED) Healthcare Agent's Name n/a   (RETIRED) Healthcare Agent's Phone Number n/a   Additional Emergency Contacts? see above   Prior to admission   Prior level of function Ambulates with assistive device;Independent with ADLs   Type of Residence Private residence   Home Layout One level  (apt on 3rd floor, has 3 flights of steps)   Have running water, electricity, heat, etc? Yes   Living Arrangements Spouse/significant other  (and boyfriend's mother)   How do you get to your MD appointments? friend/bus   How do you get your groceries?  boyfriend's mother   Who fixes your meals?  boyfriend's mother   Who does your laundry?  boyfriend's mother   Who picks up your prescriptions? pickup   Dressing Independent   Grooming Independent   Feeding Independent   Bathing Independent   Toileting Independent   DME Currently at [facility];Other (Comment)  (rollator)   Name of Prior [place] None   Prior SNF admission? (Detail) n/a   Prior Rehab admission? (Detail) n/a   Adult Protective Services (APS) involved? No    Discharge Planning   Support Systems Spouse/significant other;Family members;Friends/neighbors   Patient expects to be discharged to: home   Anticipated [place] plan discussed with: Same as interviewed   Potential barriers to discharge:   (transportation to home)   Mode of transportation: Other  (pt may need assistance w/ transportation)   Does the patient have perscription coverage? Yes   Consults/Providers   PT Evaluation Needed 1   OT Evalulation Needed 1   Correct PCP listed in Epic? Yes   PCP   PCP on file was verified as the current PCP? Yes   Important Message from Medicare Notice   Patient received 1st IMM Letter? [place]  Case Management  [phone]

## 2019-01-06 NOTE — UM Notes (Signed)
CSR 2/13-17/20  01/02/19 1821  Admit to Inpatient         01/02/19 1708   BP: 111/71   Pulse: 97   Resp: 19   Temp: 99.2 F (37.3 C)   SpO2: 93%     Current Facility-Administered Medications   Medication Dose Route Frequency    albuterol-ipratropium  3 mL Nebulization Q4H SCH    aspirin  81 mg Oral Daily    enoxaparin  0.5 mg/kg Subcutaneous Daily    gabapentin  800 mg Oral TID    guaiFENesin  600 mg Oral Q12H [place]    insulin lispro  1-3 Units Subcutaneous QHS    insulin lispro  1-5 Units Subcutaneous TID AC    meropenem  500 mg Intravenous Q6H    methylPREDNISolone  40 mg Intravenous Q8H    nicotine  1 patch Transdermal Daily    oxybutynin  5 mg Oral TID    pantoprazole  40 mg Oral QAM AC    risperiDONE  3 mg Oral BID    sertraline  100 mg Oral BID            LABS/IMAGING          Recent Labs   Lab 01/02/19  0103 01/01/19  2116   WBC 11.35* 12.59*   RBC 4.31 4.15   Hgb 12.5 12.0   Hematocrit 39.4 38.0   MCV 91.4 91.6   Platelets 263 248            Recent Labs   Lab 01/02/19  0103 01/01/19  2116   Sodium 137 140   Potassium 4.3 4.5   Chloride 110 112*   CO2 17* 19*   BUN 9.0 8.0   Creatinine 0.9 0.9   Glucose 155* 109*   Calcium 9.3 9.4           Recent Labs   Lab 01/01/19  2116   ALT 9   AST (SGOT) 18   Bilirubin, Total 0.2   Albumin 2.7*   Alkaline Phosphatase 80       Ct Angiogram Chest    Result Date: 01/02/2019   1. This is a limited study due to multiple factors including patient motion and suboptimal bolus timing. No large central filling defect within the pulmonary arteries. 2. There is mosaic attenuation and air trapping worsened mid to upper lobe distribution. While this is a nonspecific finding but can be associated with many disease entities and etiologies, it can be seen with occlusive vascular disease such as chronic thromboembolic disease. Jeanella Cara, MD 01/02/2019 3:19 PM    ASSESSMENT/PLAN   Patient Active Hospital Problem List:   Multilobar pneumonia    Acute COPD  exacerbation   OSA on CPAP qhs    History of CAD    Anxiety            Moreen Piggott Buckleris a 48 y.o.femalewith a history of COPD, active tobacco use, CAD/MI, and anxietypresenting with shortness of breath. She is admitted for pneumonia and acute COPD exacerbation.       #Multilobar Pneumonia   #Acute COPD exacerbation  -initially started on ceftriaxone and azithromycin.   -lactic acid and procalcitonin normal.  Legionella, influenza, respiratory pathogen panel negative.  -elevated d-dimer, CTA chest negative for PE but shows extensive multilobar pneumonia.  -Pulmonology consulted. she was hospitalized for pneumonia 3 months ago.  abx broadened to meropenem.    -ID consulted   -iv solumedrol 20m q8h   -continue  supplemental oxygen   -scheduled duonebs   -mucinex, incentive spirometry  -follow cultures   -monitor on telemetry          #CAD  -EKG shows NSR, nonspecific T wave changes. Serial troponins x 3 negative.   -continue aspirin  -continue to monitor on telemetry      CRITICAL CARE CONSULT:  Assesment:   Extensive multilobar pneumonia  COPD  OSA wears cpap at home  CAD hx MI  Anxiety  Hyperglycemia, steroid induced?  Plan:   Will change antibiotics to meropenem, check procalcitonin to guide length of therapy.   COPD, continue DuoNeb and steroid, add Breo  Patient unable to stay awake, will check ABG, wears cpap at home, will order bipap  Continue steroids and nebulizers  Continue asa  Will place on low correctional as steroids started and may increase blood sugar      ID CONSULT:  Reason for Consultation:      Pneumonia    Assessment:      Multifocal pneumonia   Possibility of atypical pneumonia or pulmonary fibrosis   History of heavy smoking   COPD exacerbation    Recommendations:      Start cefepime and doxycycline   Mycoplasma and Chlamydia antibodies   Repeat procalcitonin level   Pulmonary following-May need bronchoscopy/pulmonary function tests   Follow-up chest x-ray    Probiotics    Monitor electrolytes and renal functions closely   Monitor clinically                                             2/14:  MD NOTE:  SUBJECTIVE     Patient seen and examined at bedside with RN. Reports cough and shortness of breath.   Current Facility-Administered Medications   Medication Dose Route Frequency    albuterol-ipratropium  3 mL Nebulization Q4H SCH    aspirin  81 mg Oral Daily    cefepime  1 g Intravenous Q12H SCH    doxycycline  100 mg Oral Q12H SCH    enoxaparin  0.5 mg/kg Subcutaneous Daily    fluticasone furoate-vilanterol  1 puff Inhalation QAM    gabapentin  800 mg Oral TID    guaiFENesin  600 mg Oral Q12H [place]    insulin lispro  1-3 Units Subcutaneous QHS    insulin lispro  1-5 Units Subcutaneous TID AC    lactobacillus/streptococcus  1 capsule Oral Daily    methylPREDNISolone  40 mg Intravenous Q8H    nicotine  1 patch Transdermal Daily    oxybutynin  5 mg Oral TID    pantoprazole  40 mg Oral QAM AC    risperiDONE  3 mg Oral BID    sertraline  100 mg Oral BID       Plan:      Continue cefepime and doxycycline   Sputum culture   Mycoplasma and Chlamydia antibodies pending   Pulmonary following-May need bronchoscopy/pulmonary function tests   Follow-up chest x-ray   Continue probiotics   Monitor electrolytes and renal functions closely   Monitor clinically       CRITICAL CARE NOTE:  Plan:     .  Antibiotic was changed to meropenem today, follow chest x-ray  .  Continue DuoNeb, Breo, also on IV Solu-Medrol which can be changed to p.o. prednisone in the morning  .  Continue nocturnal BiPAP  .  Stable coronary status  .  Monitor blood sugar continue sliding scale        2/15:  Blood pressure 106/55, pulse 72, temperature 98 F (36.7 C), temperature source Oral, resp. rate 20, height 1.753 m (_0 ), weight 145 kg (319 lb 10.7 oz), SpO2 94 %.    Requiring 5L NC during the day.   Wore bipap overnight. Feeling a bit better today.   Still having cough.       Current Facility-Administered Medications   Medication Dose Route Frequency    albuterol-ipratropium  3 mL Nebulization Q4H SCH    aspirin  81 mg Oral Daily    cefepime  1 g Intravenous Q12H [place]    doxycycline  100 mg Oral Q12H SCH    enoxaparin  0.5 mg/kg Subcutaneous Daily    fluticasone furoate-vilanterol  1 puff Inhalation QAM    gabapentin  800 mg Oral TID    guaiFENesin  600 mg Oral Q12H [place]    insulin lispro  1-3 Units Subcutaneous QHS    insulin lispro  1-5 Units Subcutaneous TID AC    lactobacillus/streptococcus  1 capsule Oral Daily    methylPREDNISolone  40 mg Intravenous Q8H    nicotine  1 patch Transdermal Daily    oxybutynin  5 mg Oral TID    pantoprazole  40 mg Oral QAM AC    risperiDONE  3 mg Oral BID    sertraline  100 mg Oral BID            LABS/IMAGING           Recent Labs   Lab 01/04/19  0449 01/03/19  0319 01/02/19  0103   WBC 14.13* 16.80* 11.35*         2/16:  ID NOTE:  Plan:      Continue cefepime and doxycycline   Sputum culture pending   Mycoplasma and Chlamydia antibodies pending   Pulmonary following   Follow-up chest x-ray   Continue probiotics   Monitor electrolytes and renal functions closely   Monitor clinically    Discussed with patient in detail      2/17:  ID NOTE  Plan:      Discontinue cefepime   Continue doxycycline   Pulmonary following   Follow-up chest x-ray   Continue probiotics   Monitor electrolytes and renal functions closely   Monitor clinically     Current Facility-Administered Medications   Medication Dose Route Frequency    albuterol-ipratropium  3 mL Nebulization Q4H SCH    aspirin  81 mg Oral Daily    cefepime  1 g Intravenous Q12H [place]    doxycycline  100 mg Oral Q12H SCH    enoxaparin  0.5 mg/kg Subcutaneous Daily    fluticasone furoate-vilanterol  1 puff Inhalation QAM    gabapentin  800 mg Oral TID    guaiFENesin  600 mg Oral Q12H [place]    insulin lispro  1-3 Units Subcutaneous QHS    insulin lispro  1-5 Units  Subcutaneous TID AC    lactobacillus/streptococcus  1 capsule Oral Daily    methylPREDNISolone  20 mg Intravenous Q8H    nicotine  1 patch Transdermal Daily    oxybutynin  5 mg Oral TID    pantoprazole  40 mg Oral QAM AC    risperiDONE  3 mg Oral BID    sertraline  100 mg Oral BID       Lab 01/06/19  0326 01/05/19  0253 01/04/19  0449   WBC 16.06* 12.12* 14.13*       PULM NOTE:  Hospital Day: 4           Assessment:   Multilobar pneumonia  COPD exacerbation  Morbid obesity  Obstructive sleep apnea  Coronary artery disease  Hyperglycemia  Anxiety disorder      Plan:       Continue antibiotics patient is on meropenem  Bronchodilators  IV corticosteroid  Inhaled corticosteroid  Nocturnal BiPAP  Follow chest x-ray      Reason for contd. Hospitalization = management of pneumonia and COPD exacerbation       01/06/19 1500   BP: (!) 89/58   Pulse: 83   Resp: 17   Temp: 98.7 F (37.1 C)   SpO2: 97%          Marciano Sequin, RN, BSN, ACM, CCM  Utilization Review Nurse  [facility]  [phone] ([place])  [phone] ([place])    Please submit all clinical review requests via fax to [phone]

## 2019-01-07 ENCOUNTER — Inpatient Hospital Stay: Payer: Medicaid Other

## 2019-01-07 LAB — BASIC METABOLIC PANEL
Anion Gap: 9 (ref 5.0–15.0)
BUN: 18 mg/dL (ref 7.0–19.0)
CO2: 22 mEq/L (ref 22–29)
Calcium: 9.8 mg/dL (ref 8.5–10.5)
Chloride: 106 mEq/L (ref 100–111)
Creatinine: 0.7 mg/dL (ref 0.6–1.0)
Glucose: 113 mg/dL — ABNORMAL HIGH (ref 70–100)
Potassium: 5.1 mEq/L (ref 3.5–5.1)
Sodium: 137 mEq/L (ref 136–145)

## 2019-01-07 LAB — CBC
Absolute NRBC: 0 10*3/uL (ref 0.00–0.00)
Hematocrit: 40.5 % (ref 34.7–43.7)
Hgb: 12.7 g/dL (ref 11.4–14.8)
MCH: 29.2 pg (ref 25.1–33.5)
MCHC: 31.4 g/dL — ABNORMAL LOW (ref 31.5–35.8)
MCV: 93.1 fL (ref 78.0–96.0)
MPV: 10 fL (ref 8.9–12.5)
Nucleated RBC: 0 /100 WBC (ref 0.0–0.0)
Platelets: 360 10*3/uL — ABNORMAL HIGH (ref 142–346)
RBC: 4.35 10*6/uL (ref 3.90–5.10)
RDW: 14 % (ref 11–15)
WBC: 12.95 10*3/uL — ABNORMAL HIGH (ref 3.10–9.50)

## 2019-01-07 LAB — GLUCOSE WHOLE BLOOD - POCT
Whole Blood Glucose POCT: 134 mg/dL — ABNORMAL HIGH (ref 70–100)
Whole Blood Glucose POCT: 127 mg/dL — ABNORMAL HIGH (ref 70–100)
Whole Blood Glucose POCT: 110 mg/dL — ABNORMAL HIGH (ref 70–100)

## 2019-01-07 LAB — CHLAMYDIA ANTIBODIES
Chlamydia trachomatis, IgG: <1:64 {titer}
Chlamydia trachomatis, IgM: <1:10 {titer}
Chlamydophila pneumoniae, IgG: <1:64 {titer}
Chlamydophila pneumoniae, IgM: <1:10 {titer}
Chlamydophila psittaci , IgG: <1:64 {titer}
Chlamydophila psittaci, IgM: <1:10 {titer}

## 2019-01-07 LAB — HEMOLYSIS INDEX: Hemolysis Index: 4 (ref 0–18)

## 2019-01-07 LAB — GFR: EGFR: >60

## 2019-01-07 LAB — PROCALCITONIN: Procalcitonin: 0.02 (ref 0.00–0.10)

## 2019-01-07 MED ORDER — FAMOTIDINE 20 MG PO TABS
20.0000 mg | ORAL_TABLET | Freq: Two times a day (BID) | ORAL | Status: DC
Start: 2019-01-07 — End: 2019-01-07

## 2019-01-07 MED ORDER — ALUM & MAG HYDROXIDE-SIMETH 200-200-20 MG/5ML PO SUSP
20.00 mL | ORAL | Status: DC | PRN
Start: 2019-01-07 — End: 2019-01-11

## 2019-01-07 MED ORDER — PREDNISONE 20 MG PO TABS
60.0000 mg | ORAL_TABLET | Freq: Every morning | ORAL | Status: DC
Start: 2019-01-07 — End: 2019-01-08
  Administered 2019-01-07 – 2019-01-08 (×2): 60 mg via ORAL
  Filled 2019-01-07 (×2): qty 3

## 2019-01-07 NOTE — Progress Notes (Signed)
PULMONARY PROGRESS NOTE                                                                                                              [phone]    Date Time: 01/07/19 11:37 AM  Patient Name: [name], [name] 48 y.o. female admitted with <principal problem not specified>  Admit Date: 01/01/2019    Patient status: Inpatient  Hospital Day: 5           Assessment:   Acute hypoxic respiratory failure, on oxygen support  Multilobar pneumonia  COPD exacerbation, improving but still some wheezing   Morbid obesity  Obstructive sleep apnea  Coronary artery disease  Hyperglycemia  Anxiety disorder    Plan:   cxr reviewed, stable  D./c IV steroids, change to oral  Oxygen support  Nocturnal bipap  conitnue antibiotic , on doxycycline, s/p cefepime  Continue nebs and breo  Pt/ot  Other conditions per primary team  D/w pt and nurse     Reason for contd. Hospitalization =pneumonia  Subjective:     Some improvement in sob  Still on oxygen support  +wheezing            Medications:     Current Facility-Administered Medications   Medication Dose Route Frequency    albuterol-ipratropium  3 mL Nebulization Q4H SCH    aspirin  81 mg Oral Daily    doxycycline  100 mg Oral Q12H SCH    enoxaparin  0.5 mg/kg Subcutaneous Daily    fluticasone furoate-vilanterol  1 puff Inhalation QAM    gabapentin  800 mg Oral TID    guaiFENesin  600 mg Oral Q12H [place]    insulin lispro  1-3 Units Subcutaneous QHS    insulin lispro  1-5 Units Subcutaneous TID AC    lactobacillus/streptococcus  1 capsule Oral Daily    methylPREDNISolone  20 mg Intravenous Q8H    nicotine  1 patch Transdermal Daily    oxybutynin  5 mg Oral TID    pantoprazole  40 mg Oral QAM AC    risperiDONE  3 mg Oral BID    sertraline  100 mg Oral BID       Review of Systems:        General ROS:  Afebrile    ENT ROS:  No sore throat no nasal discharge   Endocrine ROS: + fatigue   Respiratory ROS:   +  shortness of breath wheezing cough chest congestion    Cardiovascular  ROS:  No chest pain or palpitation   Gastrointestinal ROS:  No nausea vomiting diarrhea.  No melanotic stool   Genito-Urinary ROS:  No burning in the urine or hematuria   Musculoskeletal ROS:  No musculoskeletal deformities   Neurological ROS:  No stroke seizure disorder   Dermatological ROS:  No skin rash        Physical Exam:     Vitals:    01/07/19 1115   BP: 106/60   Pulse: 80   Resp: 18   Temp: 97.7 F (36.5  C)   SpO2: 93%         Intake/Output Summary (Last 24 hours) at 01/07/2019 1137  Last data filed at 01/07/2019 0900  Gross per 24 hour   Intake 300 ml   Output 500 ml   Net -200 ml           General appearance - no visible respiratory distress patient does not appear toxic  Mental status -  Alert and oriented x3  Eyes - EOMI PERRLA  Nose - no nasal discharge  Mouth - mucous membrane is moist.  No evidence of sore throat  Neck - no JVD lymphadenopathy or thyromegaly  Chest - wheezing bilaterally   Heart - S1-S2 RRR no S3-S4 no murmur  Abdomen - soft nontender bowel sounds are normal   Neurological - no motor or sensory deficit  Extremities - no edema clubbing or cyanosis  Skin - no skin rash      Labs:     CBC w/Diff CMP   Recent Labs   Lab 01/07/19  0336 01/06/19  0326 01/05/19  0253  01/01/19  2116   WBC 12.95* 16.06* 12.12*  More results in Results Review 12.59*   Hgb 12.7 11.9 12.2  More results in Results Review 12.0   Hematocrit 40.5 37.4 38.9  More results in Results Review 38.0   Platelets 360* 335 324  More results in Results Review 248   MCV 93.1 91.7 92.4  More results in Results Review 91.6   Neutrophils  --   --   --   --  76.4   More results in Results Review = values in this interval not displayed.       PT/INR         Recent Labs   Lab 01/07/19  0336 01/06/19  0326 01/05/19  0253  01/01/19  2116   Sodium 137 140 140  More results in Results Review 140   Potassium 5.1 4.3 4.9  More results in Results Review 4.5   Chloride 106 109 111  More results in Results Review 112*   CO2 _0 More results in Results Review 19*   BUN 18.0 17.0 14.0  More results in Results Review 8.0   Creatinine 0.7 0.7 0.7  More results in Results Review 0.9   Glucose 113* 88 118*  More results in Results Review 109*   Calcium 9.8 9.9 10.2  More results in Results Review 9.4   Protein, Total  --   --   --   --  5.4*   Albumin  --   --   --   --  2.7*   AST (SGOT)  --   --   --   --  18   ALT  --   --   --   --  9   Alkaline Phosphatase  --   --   --   --  80   Bilirubin, Total  --   --   --   --  0.2   More results in Results Review = values in this interval not displayed.      Glucose POCT   Recent Labs   Lab 01/07/19  0336 01/06/19  0326 01/05/19  0253 01/04/19  0449 01/03/19  0319 01/02/19  0103 01/01/19  2116   Glucose 113* 88 118* 128* 129* 155* 109*        Recent Labs   Lab 01/02/19  0839 01/02/19  3729 01/01/19  2116   Troponin I <0.01 <0.01 <0.01         ABGs:    ABG CollectionSite   Date Value Ref Range Status   01/02/2019 Left Radl  Final     Allen's Test   Date Value Ref Range Status   01/02/2019 Yes  Final     pH, Arterial   Date Value Ref Range Status   01/02/2019 7.312 (L) 7.350 - 7.450 Final     pCO2, Arterial   Date Value Ref Range Status   01/02/2019 34.6 (L) 35.0 - 45.0 mmHg Final     pO2, Arterial   Date Value Ref Range Status   01/02/2019 85.5 80.0 - 90.0 mmHg Final     HCO3, Arterial   Date Value Ref Range Status   01/02/2019 17.0 (L) 23.0 - 29.0 mEq/L Final     Base Excess, Arterial   Date Value Ref Range Status   01/02/2019 -7.9 (L) -2.0 - 2.0 mEq/L Final     O2 Sat, Arterial   Date Value Ref Range Status   01/02/2019 96.4 95.0 - 100.0 % Final       Urinalysis        Invalid input(s): LEUKOCYTESUR      Rads:   Xr Chest Ap Portable    Result Date: 01/07/2019   Stable cardiomegaly. Stable bilateral interstitial prominence. Trudie Reed, MD 01/07/2019 9:50 AM    Xr Chest Ap Portable    Result Date: 01/06/2019   Stable diffuse lung disease Elyn Peers, MD 01/06/2019 12:10 PM      Quillian Quince,  MD  01/07/2019  11:37 AM

## 2019-01-07 NOTE — UM Notes (Signed)
[facility] Utilization Review  NPI: [id number], Tax ID: [id number]  Please Call Krystal Eaton, MSN, RN @ [phone] with any questions or concerns. Confidential voicemail.  Email: [name]   Fax final authorizations and requests for additional information to [phone]    01/07/19 Continued Stay Review     SUBJECTIVE     Patient seen and examined at bedside with RN. Currently on 4L NC. Wore bipap overnight.   Reports feeling a bit better today. Updated on plan of care.     Temp:  [97.2 F (36.2 C)-98.9 F (37.2 C)] 98.1 F (36.7 C)  Heart Rate:  [69-84] 77  Resp Rate:  [17-20] 20  BP: (92-114)/(50-65) 101/61  FiO2:  [30 %] 30 %     Lab Results last 48 Hours     Procedure Component Value Units Date/Time    Glucose Whole Blood - POCT [[id number]]  (Abnormal) Collected:  01/07/19 1623     Updated:  01/07/19 1633     POCT - Glucose Whole blood 110 mg/dL     Glucose Whole Blood - POCT [[id number]]  (Abnormal) Collected:  01/07/19 1116     Updated:  01/07/19 1133     POCT - Glucose Whole blood 127 mg/dL     Glucose Whole Blood - POCT [[id number]]  (Abnormal) Collected:  01/07/19 0635     Updated:  01/07/19 0707     POCT - Glucose Whole blood 134 mg/dL     CULTURE + Lacretia Leigh [[id number]] Collected:  01/04/19 1601    Specimen:  Sputum, Expectorated Updated:  01/07/19 0654    Narrative:       ORDER#: [id number]                                    ORDERED BY: Gustavus Messing, IMTI  SOURCE: Sputum, Expectorated Sputum                  COLLECTED:  01/04/19 16:01  ANTIBIOTICS AT COLL.:                                RECEIVED :  01/05/19 00:21  Stain, Gram (Respiratory)                  FINAL       01/05/19 01:57  01/05/19   Few WBC's             Few Squamous epithelial cells             Few Mixed Respiratory Flora  Culture and Gram Stain, Aerobic, RespiratorFINAL       01/07/19 06:54  01/06/19   Moderate growth of mixed upper respiratory flora  01/07/19   NO Pseudomonas aeruginosa              NO Staph aureus             NO MRSA      Hemolysis index [[id number]] Collected:  01/07/19 0336     Updated:  01/07/19 0522     Hemolysis Index 4    GFR [[id number]] Collected:  01/07/19 0336     Updated:  01/07/19 0522     EGFR >20.9    Basic Metabolic Panel [[id number]]  (Abnormal) Collected:  01/07/19 0336    Specimen:  Blood Updated:  01/07/19 0522     Glucose 113 mg/dL  CBC without differential [[id number]]  (Abnormal) Collected:  01/07/19 0336    Specimen:  Blood Updated:  01/07/19 0454     WBC 12.95 x10 3/uL      Platelets 360 x10 3/uL      MCHC 31.4 g/dL     Procalcitonin [[id number]] Collected:  01/06/19 1941     Updated:  01/07/19 0447     Procalcitonin 0.02    Mycoplasma pneumoniae Ab (IgG,IgM),EIA [[id number]] Collected:  01/02/19 2108     Updated:  01/06/19 1258     Mycoplasma Pneumoniae Ab, IgG Positive     Mycoplasma Pneumoniae Ab, IgM Negative     Mycoplasma Pneumoniae AB Interp SEE BELOW    Glucose Whole Blood - POCT [[id number]]  (Abnormal) Collected:  01/06/19 1117     Updated:  01/06/19 1221     POCT - Glucose Whole blood 124 mg/dL     Glucose Whole Blood - POCT [[id number]]  (Abnormal) Collected:  01/06/19 0626     Updated:  01/06/19 0754     POCT - Glucose Whole blood 104 mg/dL        XR Chest AP Portable [[id number]] (Order [id number])   IMPRESSION:   cardiomegaly. Stable bilateral interstitial  prominence.    Scheduled Meds:  Current Facility-Administered Medications  Medication Dose Route Frequency   albuterol-ipratropium  3 mL Nebulization Q4H SCH   aspirin  81 mg Oral Daily   doxycycline  100 mg Oral Q12H SCH   enoxaparin  0.5 mg/kg Subcutaneous Daily   fluticasone furoate-vilanterol  1 puff Inhalation QAM   gabapentin  800 mg Oral TID   guaiFENesin  600 mg Oral Q12H SCH   insulin lispro  1-3 Units Subcutaneous QHS   insulin lispro  1-5 Units Subcutaneous TID AC   lactobacillus/streptococcus  1 capsule Oral Daily   nicotine  1 patch Transdermal Daily   oxybutynin  5 mg Oral TID    pantoprazole  40 mg Oral QAM AC   predniSONE  60 mg Oral QAM W/BREAKFAST   risperiDONE  3 mg Oral BID   sertraline  100 mg Oral BID    PRN Meds:.acetaminophen **OR** acetaminophen, ALPRAZolam, alum & mag hydroxide-simethicone, benzonatate, Nursing communication: Adult Hypoglycemia Treatment Algorithm **AND** dextrose **AND** dextrose **AND** glucagon (rDNA), diphenhydrAMINE, guaiFENesin-dextromethorphan, influenza, naloxone, nitroglycerin, ondansetron **OR** ondansetron, oxyCODONE-acetaminophen, senna-docusate    Per Pulmonology Note:  Assessment:   Acute hypoxic respiratory failure, on oxygen support  Multilobar pneumonia  COPD exacerbation, improving but still some wheezing   Morbid obesity  Obstructive sleep apnea  Coronary artery disease  Hyperglycemia  Anxiety disorder    Plan:   cxr reviewed, stable  D./c IV steroids, change to oral  Oxygen support  Nocturnal bipap  conitnue antibiotic , on doxycycline, s/p cefepime  Continue nebs and breo  Pt/ot  Other conditions per primary team  D/w pt and nurse     Per Medicine Note:  ASSESSMENT/PLAN   Patient Active Hospital Problem List:   Multilobar pneumonia    Acute COPD exacerbation   OSA on CPAP qhs    History of CAD    Anxiety            Madeline L Buckleris a 48 y.o.femalewith a history of COPD, active tobacco use, CAD/MI, and anxietypresenting with shortness of breath. she was hospitalized for pneumonia 3 months ago in [place].   She is admitted for pneumonia and acute COPD exacerbation. Elevated d-dimer, CTA chest negative for PE but concerning for extensive multilobar pneumonia. venous  lower extremity doppler negative for DVT. Lactic acid and procalcitonin normal.  Legionella, influenza, respiratory pathogen panel negative.  Echo shows LVEF 60-65%, moderate pulmonary hypertension. Pulmonology and ID consulted. On cefepime and doxycyline.       #Acute hypoxic respiratory failure   #Multilobar Pneumonia   #Acute COPD exacerbation  -CTA chest negative for PE  but concerning for extensive multilobar pneumonia.  -ID following, appreciate recs.  Patient's antibiotic changed to doxycycline  -Pulmonology following, appreciate recs  -Patient's iv solumedrol downgraded to oral prednisone 60 mg daily.  -sputum cx showing moderate growth of mixed upper respiratory flora, viral respiratory panel negative, influenza negative, Legionella negative  -continue supplemental oxygen, on 4 liters. BIPAP qhs   -continue breo, scheduled duonebs   -mucinex, incentive spirometry  -monitor on telemetry    #CAD  -EKG shows NSR, nonspecific T wave changes. Serial troponins x 3 negative.   -continue aspirin  -continue to monitor on telemetry    #Tobacco use  -advised cessation. Nicotine patch.     #OSA   -on cpap qhs      #Anxiety  -continue Resporal Zoloft and Neurontin    #Overactive bladder  -continue oxybutynin      Krystal Eaton, RN, MSN  Utilization Review Case Management  [phone] (F)

## 2019-01-07 NOTE — PT Eval Note (Addendum)
[facility]  Physical Therapy Evaluation and Treatment    Patient: [name]  MRN#: [id number]  Unit: [place] [facility]  Bed: A2515/A2515-01    Time of Evaluation and Treatment:  Time Calculation  PT Received On: 01/07/19  Start Time: 7741  Stop Time: 1507  Time Calculation (min): 24 min    Evaluation Time: 15 minutes  Treatment Time: 9 minutes    Chart Review and Collaboration with Care Team: 5 minutes, not included in above time    PT Visit Number: 1    Consult received for [name] for PT Evaluation and Treatment.  Patients medical condition is appropriate for Physical therapy intervention at this time.    Activity Orders:  PT eval and treat    Precautions and Contraindications:  Precautions  Weight Bearing Status: no restrictions  Other Precautions: Falls    Medical Diagnosis:  SOB (shortness of breath) [R06.02]  History of COPD [Z87.09]    History of Present Illness:  [name] is a 48 y.o. female admitted on 01/01/2019 with multilobar PNA and acute COPD exacerbation.    Patient Active Problem List   Diagnosis    SOB (shortness of breath)       Past Medical/Surgical History:  Past Medical History:   Diagnosis Date    Anxiety     Chronic obstructive pulmonary disease     Myocardial infarction     Panic attacks      Past Surgical History:   Procedure Laterality Date    APPENDECTOMY      ARTHROSCOPIC KNEE, ACL RECONSTRUCTION      R. knee     CHOLECYSTECTOMY         X-Rays/Tests/Labs:  Lab Results   Component Value Date/Time    HGB 12.7 01/07/2019 03:36 AM    HCT 40.5 01/07/2019 03:36 AM    K 5.1 01/07/2019 03:36 AM    NA 137 01/07/2019 03:36 AM    TROPI <0.01 01/02/2019 08:39 AM    TROPI <0.01 01/02/2019 05:06 AM    TROPI <0.01 01/01/2019 09:16 PM       All imaging reviewed, please see chart for details.    Social History:  Prior Level of Function  Prior level of function: Ambulates with assistive device, Independent with ADLs  Assistive Device: Four wheel  walker  Baseline Activity Level: Household ambulation  Driving: does not drive  DME Currently at Home: ADL- Civil engineer, contracting, Other (Comment)(rollator)    Home Living Arrangements  Living Arrangements: Spouse/significant other(and boyfriend's mother)  Type of Home: Apartment  Home Layout: One level(apt on 3rd floor, has 3 flights of steps)  Bathroom Shower/Tub: Research scientist (physical sciences): Civil engineer, contracting  DME Currently at Home: ADL- Civil engineer, contracting, Other (Comment)(rollator)  Home Living - Notes / Comments: Pt reports someone is typically home with her.      Subjective:  Patient is agreeable to participation in the therapy session.    "I get so short of breath doing that, it scares me" re: turning and bending to rearrange linens.     Pain Assessment  Pain Assessment: (Chronic pain, requesting pain meds)      Objective:  Observation of Patient/Vital Signs:  Unable to obtain sats.  Rec portable pulse ox next session.     Patient received in bed with telemetry  and O2 at 2 liters/minute via NC in place.    Cognitive Status and Neuro Exam:  Cognition/Neuro Status  Arousal/Alertness:  Appropriate responses to stimuli  Attention Span: Appears intact  Problem Solving: Able to problem solve independently  Behavior: calm;cooperative  Motor Planning: intact  Coordination: intact    Functional Mobility:  Functional Mobility  Supine to Sit: Minimal Assist  Scooting to EOB: Supervision  Sit to Stand: Supervision  Stand to Sit: Supervision     Locomotion  Ambulation: Contact Guard Assist;with front-wheeled walker 10'  Pattern: R foot decreased clearance;L foot decreased clearance;decreased cadence;decreased step length;Wide BOS  Stair Management: (NT - 3 FOS to enter home.  Strongly rec stairs assessment w/ increased respiratory stability.)     Balance  Balance  Balance: needs focused assessment  Standing - Static: Fair(w/ RW)  Standing - Dynamic: Fair(w/ RW)    Participation and Activity  Tolerance  Participation and Endurance  Participation Effort: good  Endurance: (Increased work of breathing, unable to obtain O2 sat)  [place] [employer] Dyspnea Scale: 2+ Dyspnea(Pt rates SOB as 2-3 (mild to moderate))    Educated the patient to role of physical therapy, plan of care, goals of therapy and safety with mobility and ADLs.    Patient left in bed, call bell and all personal items/needs within reach. RN notified of session outcome.      Assessment:  Madeline VINCIGUERRA is a 48 y.o. female admitted 01/01/2019. Pt would benefit from Physical Therapy to address deficits and increase functional independence. There are few comorbidities or other factors that affect plan of care and require modification of task including:respiratory, personal factors including: assistive device at baseline, challenging home environment .  Pt's functional mobility is impacted by: activity tolerance/endurance and gait.  Standardized tests and exams incorporated into evaluation include AMPAC mobility.   Pt demonstrates a stable clinical presentation.      Complexity Level Hx and Co-  morbidites Examination Clinical Decision Making Clinical Presentation   Low no impact 1-2 elements Limited options Stable       Treatment:  Pt requires frequent rest breaks 2/2 SOB.  Reviewed PLB and pacing, pt demos improved activity tolerance with PLB.  Able to amb additional 60' w/ RW SBA, significantly decreased cadence.  Pt w/ limited amb distance 2/2 poor endurance.  Return to EOB, reviewed therex for endurance AP, LAQs, marches time based.  Pt w/ return demonstration.  Reviewed benefits of frequent short bouts of activity to increase endurance.  Pt verbalized understanding, agreeable to [facility] therapy on d/c.  May eventually benefit from [facility] rehab when able to negotiate stairs on a regular basis.    Plan:  Treatment/Interventions: Personnel officer, Stair training  PT Frequency: 1-2x/wk  Risks/Benefits/POC Discussed with Pt/Family: With patient    PMP  Activity: Step 7 - Walks out of Room  Distance Walked (ft) (Step 6,7): 70 Feet      Goals:  Goals  Goal Formulation: With patient  Time for Goal Acheivement: By time of discharge  Goals: Select goal  Pt Will Go Supine To Sit: independent  Pt Will Perform Sit to Stand: independent  Pt Will Transfer Bed/Chair: with rolling walker, modified independent  Pt Will Ambulate: 101-150 feet, with rolling walker, modified independent  Pt Will Go Up / Down Stairs: 1 flight, modified independent, With rail    AM-PAC:yes        PT Basic Mobility Raw Score: 18  CMS 0-100% Score: 46.58%                DME Recommended for Discharge: (Rollator at home)  Discharge Recommendation: Home with supervision,  Home with home health PT, Other(Comment)(PTS transport for assistance w/ stairs to enter home)    Bobetta Lime PT DPT  [phone]  01/07/2019 3:22 PM

## 2019-01-07 NOTE — Plan of Care (Signed)
Problem: Safety  Goal: Patient will be free from injury during hospitalization  Outcome: Progressing  Flowsheets (Taken 01/07/2019 2021)  Patient will be free from injury during hospitalization : Assess patient's risk for falls and implement fall prevention plan of care per policy; Use appropriate transfer methods; Ensure appropriate safety devices are available at the bedside; Hourly rounding; Provide and maintain safe environment; Include patient/ family/ care giver in decisions related to safety     Problem: Compromised Hemodynamic Status  Goal: Vital signs and fluid balance maintained/improved  Outcome: Progressing  Flowsheets (Taken 01/07/2019 2022)  Vital signs and fluid balance are maintained/improved: Position patient for maximum circulation/cardiac output; Monitor intake and output. Notify LIP if urine output is less than 30 mL/hour.; Monitor/assess vitals and hemodynamic parameters with position changes; Monitor and compare daily weight; Monitor/assess lab values and report abnormal values    Patient A&Ox4, NSR on the monitor, with controlled HR, Vital signs stable. Denies any Chest Pain. No Respiratory distress, O2Sat 95% on 2 Li/Oxygenation.  Blood sugar remained on the low side. No insulin coverage needed.  Ambulates with Physical therapy with steady gait with walker.    Plan:   Reinforce PNA teaching, monitor intake and output, daily weight, monitor oxygenation. Possible discharge via Wheelchair van.

## 2019-01-07 NOTE — Plan of Care (Signed)
Problem: Safety  Goal: Patient will be free from injury during hospitalization  Outcome: Progressing  Flowsheets (Taken 01/06/2019 2000)  Patient will be free from injury during hospitalization : Assess patient's risk for falls and implement fall prevention plan of care per policy; Hourly rounding; Use appropriate transfer methods; Provide and maintain safe environment     Problem: Inadequate Gas Exchange  Goal: Adequate oxygenation and improved ventilation  Outcome: Progressing  Flowsheets (Taken 01/05/2019 1439 by Daun Peacock, RN)  Adequate oxygenation and improved ventilation: Assess lung sounds;Position for maximum ventilatory efficiency;Plan activities to conserve energy: plan rest periods;Monitor SpO2 and treat as needed;Teach/reinforce use of incentive spirometer 10 times per hour while awake, cough and deep breath as needed;Increase activity as tolerated/progressive mobility;Monitor and treat ETCO2;Provide mechanical and oxygen support to facilitate gas exchange;Consult/collaborate with Respiratory Therapy                   Pt resting well.VSS .Refused FSBS  for tonite.States she  does not  need BS to be checked so freq.She states they are within normal most of time.Pt on Alb Nebs q 4 hrs scheduled.Pt also receives solumedrol 23m IV q 8 hrs. Continues on Doxycycline 1026mpo q 12 hrs.Pt states she needs to have a BM. Last  BM 2/14.Will continue to provide safe environment for Pt.

## 2019-01-07 NOTE — Plan of Care (Signed)
Discharge Recommendation: Home with supervision, Home with home health PT, Other(Comment)(PTS transport for assistance w/ stairs to enter home)  DME Recommended for Discharge: (Rollator at home)    Is an Occupational Therapy Evaluation Indicated at this time? This patient is already on OT caseload.     Treatment/Interventions: Personnel officer, Stair training  PT Frequency: 1-2x/wk     Next Visit Recommendation: PT - Next Visit Recommendation: 01/09/19     PMP Activity: Step 7 - Walks out of Room  Distance Walked (ft) (Step 6,7): 70 Feet  (Please See Therapy Evaluation for device and assistance level needed)    Goals:   Goals  Goal Formulation: With patient  Time for Goal Acheivement: By time of discharge  Goals: Select goal  Pt Will Go Supine To Sit: independent  Pt Will Perform Sit to Stand: independent  Pt Will Transfer Bed/Chair: with rolling walker, modified independent  Pt Will Ambulate: 101-150 feet, with rolling walker, modified independent  Pt Will Go Up / Down Stairs: 1 flight, modified independent, With rail

## 2019-01-07 NOTE — Progress Notes (Signed)
[facility]  PROGRESS NOTE      Patient: [name]  Date: 01/07/2019   LOS: 5 Days  Admission Date: 01/01/2019   MRN: [id number]  Attending: Marya Landry  Please contact me on PerfectServe            SUBJECTIVE     Patient seen and examined at bedside with RN. Currently on 4L NC. Wore bipap overnight.   Reports feeling a bit better today. Updated on plan of care.   Denies chest pain, abdominal pain, nausea, vomiting, diarrhea.        OBJECTIVE     Vitals:    01/07/19 1115   BP: 106/60   Pulse: 80   Resp: 18   Temp: 97.7 F (36.5 C)   SpO2: 93%       Temperature: Temp  Min: 97.2 F (36.2 C)  Max: 98.9 F (37.2 C)  Pulse: Pulse  Min: 69  Max: 88  Respiratory: Resp  Min: 17  Max: 20  Non-Invasive BP: BP  Min: 89/58  Max: 114/63  Pulse Oximetry SpO2  Min: 93 %  Max: 98 %    Intake and Output Summary (Last 24 hours) at Date Time    Intake/Output Summary (Last 24 hours) at 01/07/2019 1439  Last data filed at 01/07/2019 0900  Gross per 24 hour   Intake 700 ml   Output 500 ml   Net 200 ml       PHYSICAL EXAMINATION  GEN APPEARANCE: chronically ill-appearing in NAD.  Morbidly obese female alert and cooperative.   Occasionally coughing.    HEENT: NCAT, EOMI, PERRL, no nasal discharge.   conjunctivae/corneas clear. Oral mucosa moist.   Neck: Supple without meningismus   CVS: RRR, S1, S2. No murmur   LUNGS: coarse breath sounds with expiratory wheezes, symmetric air entry. On O2.   ABD: BS+, soft, obese, NT, ND, no guarding or rigidity  EXT: No edema; Pulses 2+ and intact  NEURO: AAOx3, CN 2-12 intact.  No focal neurological deficits  MENTAL STATUS: anxious otherwise appropriate affect and mood      MEDICATIONS     Current Facility-Administered Medications   Medication Dose Route Frequency    albuterol-ipratropium  3 mL Nebulization Q4H SCH    aspirin  81 mg Oral Daily    doxycycline  100 mg Oral Q12H [place]    enoxaparin  0.5 mg/kg Subcutaneous Daily    fluticasone furoate-vilanterol  1 puff Inhalation QAM     gabapentin  800 mg Oral TID    guaiFENesin  600 mg Oral Q12H [place]    insulin lispro  1-3 Units Subcutaneous QHS    insulin lispro  1-5 Units Subcutaneous TID AC    lactobacillus/streptococcus  1 capsule Oral Daily    nicotine  1 patch Transdermal Daily    oxybutynin  5 mg Oral TID    pantoprazole  40 mg Oral QAM AC    predniSONE  60 mg Oral QAM W/BREAKFAST    risperiDONE  3 mg Oral BID    sertraline  100 mg Oral BID            LABS/IMAGING     Recent Labs   Lab 01/07/19  0336 01/06/19  0326 01/05/19  0253   WBC 12.95* 16.06* 12.12*   RBC 4.35 4.08 4.21   Hgb 12.7 11.9 12.2   Hematocrit 40.5 37.4 38.9   MCV 93.1 91.7 92.4   Platelets 360* 335 324  Recent Labs   Lab 01/07/19  0336 01/06/19  0326 01/05/19  0253 01/04/19  0449 01/03/19  0319   Sodium 137 140 140 141 139   Potassium 5.1 4.3 4.9 4.9 4.6   Chloride 106 109 111 111 112*   CO2 _0 21*   BUN 18.0 17.0 14.0 13.0 9.0   Creatinine 0.7 0.7 0.7 0.7 0.7   Glucose 113* 88 118* 128* 129*   Calcium 9.8 9.9 10.2 10.0 10.2       Recent Labs   Lab 01/01/19  2116   ALT 9   AST (SGOT) 18   Bilirubin, Total 0.2   Albumin 2.7*   Alkaline Phosphatase 80       Recent Labs   Lab 01/02/19  0839 01/02/19  0506 01/01/19  2116   Troponin I <0.01 <0.01 <0.01             Microbiology Results     Procedure Component Value Units Date/Time    Legionella antigen, urine [[id number]] Collected:  01/02/19 0120    Specimen:  Urine, Clean Catch Updated:  01/02/19 1146    Narrative:       ORDER#: [id number]                                    ORDERED BY: Gloris Manchester  SOURCE: Urine, Clean Catch                           COLLECTED:  01/02/19 01:20  ANTIBIOTICS AT COLL.:                                RECEIVED :  01/02/19 04:11  Legionella, Rapid Urinary Antigen          FINAL       01/02/19 11:46  01/02/19   Negative for Legionella pneumophila Serogroup 1 Antigen             Limitations of Test:             1. Negative results do not exclude infection with Legionella                 pneumophila Serogroup 1.             2. Does not detect other serogroups of L. pneumophila                or other Legionella species.             Test Reference Range: Negative      Rapid influenza A/B antigens [[id number]] Collected:  01/02/19 0104    Specimen:  Nasopharyngeal from Nasal Aspirate Updated:  01/02/19 0138    Narrative:       ORDER#: [id number]                                    ORDERED BY: Gloris Manchester  SOURCE: Nasal Aspirate                               COLLECTED:  01/02/19 01:04  ANTIBIOTICS AT COLL.:  RECEIVED :  01/02/19 01:19  Influenza Rapid Antigen A&B                FINAL       01/02/19 01:38  01/02/19   Negative for Influenza A and B             Reference Range: Negative      Respiratory Pathogen Panel, PCR (Film Array) [[id number]] Collected:  01/02/19 0104     Updated:  01/02/19 0644     Adenovirus Not Detected     Coronavirus 229E Not Detected     Coronavirus HKU1 Not Detected     Coronavirus NL63 Not Detected     Coronavirus OC43 Not Detected     Human Metapneumovirus Not Detected     Human Rhinovirus/Enterovirus Not Detected     Influenza A Not Detected     Influenza A/H1 Not Detected     Influenza AH1 - 2009 Not Detected     Influenza A/H3 Not Detected     Influenza B Not Detected     Parainfluenza Virus 1 Not Detected     Parainfluenza Virus 2 Not Detected     Parainfluenza Virus 3 Not Detected     Parainfluenza Virus 4 Not Detected     Respiratory Syncytial Virus Not Detected     Bordetella pertussis Not Detected     Chlamydophila pneumoniae Not Detected     Mycoplasma pneumoniae Not Detected     Comment: Multiplex nucleic acid amplification assay for detection of  18 respiratory viruses and bacteria. This assay cannot  differentiate Rhinovirus/Enterovirus. If necessary for  patient care, a positive result for Rhinovirus/Enterovirus  may be followed-up using an alternate method.  Viral and bacterial nucleic acids may persist even though  no  viable organism is present. Detection of nucleic acid does  not imply that the corresponding organisms are infectious or  are the causative agents of clinical symptoms. A negative  result does not exclude the possibility of viral or  bacterial infection. Performance characteristics may vary  with circulating strains. The assay may not be able to  distinguish between existing viral strains and new variants  as they emerge.The performance of this test has not been  established in individuals who received influenza vaccine.  Recent administration of a nasal influenza vaccine may cause  false positive results for Influenza A and/or Influenza B.  This assay is FDA cleared for nasopharyngeal swab samples.  Performance characteristics for Bronchoalveolar lavage  samples have been determined by the [facility] laboratory. Other  sample types are unacceptable.          Source NP Swab             Chest 2 Views    Result Date: 01/01/2019   Patchy bilateral perihilar opacities are nonspecific. Atypical infection cannot be excluded. Clinical correlation and continued follow-up is recommended. [name], MD 01/01/2019 10:01 PM    Ct Angiogram Chest    Result Date: 01/02/2019   1. This is a limited study due to multiple factors including patient motion and suboptimal bolus timing. No large central filling defect within the pulmonary arteries. 2. There is mosaic attenuation and air trapping worsened mid to upper lobe distribution. While this is a nonspecific finding but can be associated with many disease entities and etiologies, it can be seen with occlusive vascular disease such as chronic thromboembolic disease. Jeanella Cara, MD 01/02/2019 3:19 PM    Xr Chest Ap Portable  Result Date: 01/07/2019   Stable cardiomegaly. Stable bilateral interstitial prominence. Trudie Reed, MD 01/07/2019 9:50 AM    Xr Chest Ap Portable    Result Date: 01/06/2019   Stable diffuse lung disease Elyn Peers, MD 01/06/2019 12:10 PM    Xr Chest Ap  Portable    Result Date: 01/03/2019   Pulmonary edema pattern without change which may be seen with CHF or multifocal pneumonia Elyn Peers, MD 01/03/2019 9:46 AM    US Venous Low Extrem Duplx Dopp Comp Bilat    Result Date: 01/03/2019   No ultrasound evidence of DVT in either lower extremity. Graciella Freer, MD 01/03/2019 5:14 PM    Echo  Summary    * Left ventricular ejection fraction is normal with an estimated ejection  fraction of 60-65%.    * The left ventricle is mildly dilated.    * Left ventricular wall thickness is normal.    * Left ventricular diastolic filling parameters demonstrate normal diastolic  function.    * The right ventricular cavity size is dilated.    * Normal right ventricular systolic function.    * Moderate pulmonary hypertension with estimated right ventricular systolic  pressure of  54 mmHg.    * The left atrium is mildly dilated.    * There is no aortic stenosis.    * There is no aortic regurgitation.    * There is mild mitral regurgitation.    * The aortic root is mildly dilated.    * The IVC is mildly dilated to  2.26 cm.    * There is a small pericardial effusion.       ASSESSMENT/PLAN   Patient Active Hospital Problem List:   Multilobar pneumonia    Acute COPD exacerbation   OSA on CPAP qhs    History of CAD    Anxiety            KANNA DAFOE is a 49 y.o. female with a history of COPD, active tobacco use, CAD/MI, and anxiety presenting with shortness of breath. she was hospitalized for pneumonia 3 months ago in [place].   She is admitted for pneumonia and acute COPD exacerbation. Elevated d-dimer, CTA chest negative for PE but concerning for extensive multilobar pneumonia. venous lower extremity doppler negative for DVT. Lactic acid and procalcitonin normal.  Legionella, influenza, respiratory pathogen panel negative.  Echo shows LVEF 60-65%, moderate pulmonary hypertension. Pulmonology and ID consulted. On cefepime and doxycyline.       #Acute hypoxic respiratory failure    #Multilobar Pneumonia   #Acute COPD exacerbation  -CTA chest negative for PE but concerning for extensive multilobar pneumonia.  -ID following, appreciate recs.  Patient's antibiotic changed to doxycycline  -Pulmonology following, appreciate recs  -Patient's iv solumedrol downgraded to oral prednisone 60 mg daily.  -sputum cx showing moderate growth of mixed upper respiratory flora, viral respiratory panel negative, influenza negative, Legionella negative  -continue supplemental oxygen, on 4 liters. BIPAP qhs   -continue breo, scheduled duonebs   -mucinex, incentive spirometry  -monitor on telemetry    #CAD  -EKG shows NSR, nonspecific T wave changes. Serial troponins x 3 negative.   -continue aspirin  -continue to monitor on telemetry    #Tobacco use  -advised cessation. Nicotine patch.     #OSA   -on cpap qhs      #Anxiety  -continue Resporal Zoloft and Neurontin    #Overactive bladder  -continue oxybutynin  Code: FULL   DVT prophylaxis: lovenox  Disposition: continues to require inpatient hospitalization.           Signed,  Marya Landry           This chart was generated using hospital voice-recognition software which does not employ spell-checking or grammar-checking features. It was dictated, all or in part, in a busy and often noisy patient care environment. I have taken all usual measures to dictate carefully and to review all aspects this chart. Nonetheless, given the known and well-documented performance characteristics of VR software in such patient care environments, this dictation still may contain unrecognized and wholly unintended errors or omissions

## 2019-01-07 NOTE — PT Eval Note (Signed)
[facility]  Physical Therapy Attempt Note    Patient:  [name] MRN#:  [id number]  Unit:  [facility] Room/Bed:  V8550/Z5868-25      Physical Therapy evaluation attempted on 01/07/2019 at 11:32 AM unable to complete secondary to Pt on breathing treatment, requesting PT to come back after.  Will f/u in PM.     Bobetta Lime PT DPT  [phone]  01/07/2019 11:33 AM

## 2019-01-07 NOTE — Progress Notes (Signed)
Infectious Diseases & Tropical Medicine  Progress Note    01/07/2019   [name] SHF:[id number] is a 48 y.o. female,       Assessment:      Multifocal pneumonia   COPD exacerbation   Sputum culture no growth to date   Chest x-ray-stable cardiomegaly/bilateral interstitial prominence (01/07/2019)   Procalcitonin level-0.02 (01/02/2019)   History of heavy smoking   Leukocytosis improving-on steroids   Morbid obesity    Plan:      Continue doxycycline   Repeat procalcitonin level   Pulmonary following   Follow-up chest x-ray   Continue probiotics   Monitor electrolytes and renal functions closely   Monitor clinically       ROS:     General:  no fever, no chills, no rigor, feeling better  HEENT: no neck pain, no throat pain  Endocrine:  no fatigue, no night sweats  Respiratory: Cough and shortness of breath improving  Cardiovascular: no chest pain   Gastrointestinal: Complains of left lower abdominal muscle pain,no N/V/D  Genito-Urinary: no dysuria, increased urinary frequency, trouble voiding, or hematuria   Musculoskeletal: no edema, right hip pain improved  Neurological: c/o generalized weakness   Dermatological: no rash, no ulcer    Physical Examination:     Blood pressure 114/63, pulse 69, temperature 97.9 F (36.6 C), resp. rate 17, height 1.753 m (_0 ), weight 149 kg (328 lb 8 oz), SpO2 96 %.     General Appearance: Comfortable, and in no acute distress.   HEENT: Pupils are equal, round, and reactive to light.    Lungs: Decreased breath sounds   Heart:  Regular rate and rhythm   Chest: Symmetric chest wall expansion.    Abdomen: soft ,non tender,no hepatosplenomegaly,good bowel sounds   Neurological: No focal deficit   Extremities: No edema    Laboratory And Diagnostic Studies:     Recent Labs     01/07/19  0336 01/06/19  0326   WBC 12.95* 16.06*   Hgb 12.7 11.9   Hematocrit 40.5 37.4   Platelets 360* 335     Recent Labs     01/07/19  0336 01/06/19  0326   Sodium 137  140   Potassium 5.1 4.3   Chloride 106 109   CO2 22 23   BUN 18.0 17.0   Creatinine 0.7 0.7   Glucose 113* 88   Calcium 9.8 9.9     No results for input(s): AST, ALT, ALKPHOS, PROT, ALB in the last 72 hours.    Current Meds:      Scheduled Meds: PRN Meds:    albuterol-ipratropium, 3 mL, Nebulization, Q4H SCH  aspirin, 81 mg, Oral, Daily  doxycycline, 100 mg, Oral, Q12H SCH  enoxaparin, 0.5 mg/kg, Subcutaneous, Daily  fluticasone furoate-vilanterol, 1 puff, Inhalation, QAM  gabapentin, 800 mg, Oral, TID  guaiFENesin, 600 mg, Oral, Q12H SCH  insulin lispro, 1-3 Units, Subcutaneous, QHS  insulin lispro, 1-5 Units, Subcutaneous, TID AC  lactobacillus/streptococcus, 1 capsule, Oral, Daily  methylPREDNISolone, 20 mg, Intravenous, Q8H  nicotine, 1 patch, Transdermal, Daily  oxybutynin, 5 mg, Oral, TID  pantoprazole, 40 mg, Oral, QAM AC  risperiDONE, 3 mg, Oral, BID  sertraline, 100 mg, Oral, BID        Continuous Infusions:   acetaminophen, 650 mg, Q4H PRN    Or  acetaminophen, 650 mg, Q4H PRN  ALPRAZolam, 0.5 mg, BID PRN  benzonatate, 200 mg, TID PRN  dextrose, 15 g of glucose, PRN    And  glucagon (rDNA), 1 mg, PRN    And  dextrose, 125 mL, PRN  diphenhydrAMINE, 25 mg, Q6H PRN  guaiFENesin-dextromethorphan, 5 mL, Q6H PRN  influenza, 0.5 mL, Prior to discharge  naloxone, 0.2 mg, PRN  nitroglycerin, 0.4 mg, Q5 Min PRN  ondansetron, 4 mg, Q6H PRN    Or  ondansetron, 4 mg, Q6H PRN  oxyCODONE-acetaminophen, 2 tablet, Q6H PRN  senna-docusate, 2 tablet, QHS PRN          [name] A. Gustavus Messing, M.D.  01/07/2019  9:44 AM

## 2019-01-08 ENCOUNTER — Inpatient Hospital Stay: Payer: Medicaid Other

## 2019-01-08 LAB — HEMOLYSIS INDEX: Hemolysis Index: 9 (ref 0–18)

## 2019-01-08 LAB — CBC
Absolute NRBC: 0 10*3/uL (ref 0.00–0.00)
Hematocrit: 38.5 % (ref 34.7–43.7)
Hgb: 12 g/dL (ref 11.4–14.8)
MCH: 28.4 pg (ref 25.1–33.5)
MCHC: 31.2 g/dL — ABNORMAL LOW (ref 31.5–35.8)
MCV: 91.2 fL (ref 78.0–96.0)
MPV: 10.1 fL (ref 8.9–12.5)
Nucleated RBC: 0 /100 WBC (ref 0.0–0.0)
Platelets: 364 10*3/uL — ABNORMAL HIGH (ref 142–346)
RBC: 4.22 10*6/uL (ref 3.90–5.10)
RDW: 14 % (ref 11–15)
WBC: 11.94 10*3/uL — ABNORMAL HIGH (ref 3.10–9.50)

## 2019-01-08 LAB — BASIC METABOLIC PANEL
Anion Gap: 7 (ref 5.0–15.0)
BUN: 22 mg/dL — ABNORMAL HIGH (ref 7.0–19.0)
CO2: 25 mEq/L (ref 22–29)
Calcium: 10 mg/dL (ref 8.5–10.5)
Chloride: 105 mEq/L (ref 100–111)
Creatinine: 0.7 mg/dL (ref 0.6–1.0)
Glucose: 106 mg/dL — ABNORMAL HIGH (ref 70–100)
Potassium: 4.8 mEq/L (ref 3.5–5.1)
Sodium: 137 mEq/L (ref 136–145)

## 2019-01-08 LAB — GFR: EGFR: >60

## 2019-01-08 LAB — PROCALCITONIN: Procalcitonin: 0.02 (ref 0.00–0.10)

## 2019-01-08 LAB — GLUCOSE WHOLE BLOOD - POCT
Whole Blood Glucose POCT: 82 mg/dL (ref 70–100)
Whole Blood Glucose POCT: 127 mg/dL — ABNORMAL HIGH (ref 70–100)
Whole Blood Glucose POCT: 138 mg/dL — ABNORMAL HIGH (ref 70–100)
Whole Blood Glucose POCT: 145 mg/dL — ABNORMAL HIGH (ref 70–100)

## 2019-01-08 LAB — B-TYPE NATRIURETIC PEPTIDE: B-Natriuretic Peptide: 16 pg/mL (ref 0–100)

## 2019-01-08 MED ORDER — TECHNETIUM TC 99M PENTETATE AEROSOL
28.00 | Freq: Once | Status: AC | PRN
Start: 2019-01-08 — End: 2019-01-08
  Administered 2019-01-08: 14:00:00 28 via RESPIRATORY_TRACT

## 2019-01-08 MED ORDER — PREDNISONE 10 MG PO TABS
10.0000 mg | ORAL_TABLET | Freq: Every morning | ORAL | Status: DC
Start: 2019-01-13 — End: 2019-01-11

## 2019-01-08 MED ORDER — PREDNISONE 20 MG PO TABS
30.00 mg | ORAL_TABLET | Freq: Every morning | ORAL | Status: AC
Start: 2019-01-11 — End: 2019-01-11
  Administered 2019-01-11: 09:00:00 30 mg via ORAL
  Filled 2019-01-08: qty 1

## 2019-01-08 MED ORDER — PREDNISONE 10 MG PO TABS
50.00 mg | ORAL_TABLET | Freq: Every morning | ORAL | Status: AC
Start: 2019-01-09 — End: 2019-01-09
  Administered 2019-01-09: 10:00:00 50 mg via ORAL
  Filled 2019-01-08: qty 1

## 2019-01-08 MED ORDER — PREDNISONE 20 MG PO TABS
20.0000 mg | ORAL_TABLET | Freq: Every morning | ORAL | Status: DC
Start: 2019-01-12 — End: 2019-01-11

## 2019-01-08 MED ORDER — TECHNETIUM TC 99M ALBUMIN AGGREGATED
5.00 | Freq: Once | Status: AC | PRN
Start: 2019-01-08 — End: 2019-01-08
  Administered 2019-01-08: 15:00:00 5 via INTRAVENOUS

## 2019-01-08 MED ORDER — PREDNISONE 20 MG PO TABS
40.00 mg | ORAL_TABLET | Freq: Every morning | ORAL | Status: AC
Start: 2019-01-10 — End: 2019-01-10
  Administered 2019-01-10: 10:00:00 40 mg via ORAL
  Filled 2019-01-08: qty 2

## 2019-01-08 NOTE — Progress Notes (Signed)
PULMONARY PROGRESS NOTE                                                                                                              [id number]    Date Time: 01/08/19 1:45 PM  Patient Name: [name], [name] 48 y.o. female admitted with <principal problem not specified>  Admit Date: 01/01/2019    Patient status: Inpatient  Hospital Day: 6           Assessment:   Acute hypoxic respiratory failure, on oxygen support  Multilobar pneumonia  COPD exacerbation, improving  Morbid obesity  Obstructive sleep apnea  Coronary artery disease  Hyperglycemia  Anxiety disorder    Plan:   Continue oral steroids, start taper  Oxygen support, wean as able  VQ scan to evaluate for peripheral thromboembolic disease, CT scan with mid to upper lobe changes  Nocturnal bipap  Continue antibiotics per ID  Nebs and breo  Pt/ot  Other conditions per primary team  D/w pt and nurse   Reason for contd. Hospitalization =pneumonia  Subjective:   +improvement in sob and wheezing today            Medications:     Current Facility-Administered Medications   Medication Dose Route Frequency    albuterol-ipratropium  3 mL Nebulization Q4H SCH    aspirin  81 mg Oral Daily    doxycycline  100 mg Oral Q12H SCH    enoxaparin  0.5 mg/kg Subcutaneous Daily    fluticasone furoate-vilanterol  1 puff Inhalation QAM    gabapentin  800 mg Oral TID    guaiFENesin  600 mg Oral Q12H [place]    insulin lispro  1-3 Units Subcutaneous QHS    insulin lispro  1-5 Units Subcutaneous TID AC    lactobacillus/streptococcus  1 capsule Oral Daily    nicotine  1 patch Transdermal Daily    oxybutynin  5 mg Oral TID    pantoprazole  40 mg Oral QAM AC    predniSONE  60 mg Oral QAM W/BREAKFAST    risperiDONE  3 mg Oral BID    sertraline  100 mg Oral BID       Review of Systems:        General ROS:  Afebrile    ENT ROS:  No sore throat no nasal discharge   Endocrine ROS: + fatigue   Respiratory ROS:  +  shortness of breath wheezing cough chest congestion better     Cardiovascular ROS:  No chest pain or palpitation   Gastrointestinal ROS:  No nausea vomiting diarrhea.  No melanotic stool   Genito-Urinary ROS:  No burning in the urine or hematuria   Musculoskeletal ROS:  No musculoskeletal deformities   Neurological ROS:  No stroke seizure disorder   Dermatological ROS:  No skin rash        Physical Exam:     Vitals:    01/08/19 1151   BP: 112/72   Pulse: (!) 40   Resp: 20   Temp: 97.9 F (36.6 C)  SpO2: 92%     Heart rate not 40, it is 80s    Intake/Output Summary (Last 24 hours) at 01/08/2019 1345  Last data filed at 01/08/2019 1155  Gross per 24 hour   Intake 1128 ml   Output 1201 ml   Net -73 ml           General appearance - no visible respiratory distress patient does not appear toxic obese   Mental status -  Alert and oriented x3  Eyes - EOMI PERRLA  Nose - no nasal discharge  Mouth - mucous membrane is moist.  No evidence of sore throat  Neck - no JVD lymphadenopathy or thyromegaly  Chest - improved, mild wheeze only  Heart - S1-S2 RRR no S3-S4 no murmur  Abdomen - soft nontender bowel sounds are normal  Neurological - no motor or sensory deficit  Extremities - no edema clubbing or cyanosis  Skin - no skin rash      Labs:     CBC w/Diff CMP   Recent Labs   Lab 01/08/19  0411 01/07/19  0336 01/06/19  0326  01/01/19  2116   WBC 11.94* 12.95* 16.06*  More results in Results Review 12.59*   Hgb 12.0 12.7 11.9  More results in Results Review 12.0   Hematocrit 38.5 40.5 37.4  More results in Results Review 38.0   Platelets 364* 360* 335  More results in Results Review 248   MCV 91.2 93.1 91.7  More results in Results Review 91.6   Neutrophils  --   --   --   --  76.4   More results in Results Review = values in this interval not displayed.       PT/INR         Recent Labs   Lab 01/08/19  0411 01/07/19  0336 01/06/19  0326  01/01/19  2116   Sodium 137 137 140  More results in Results Review 140   Potassium 4.8 5.1 4.3  More results in Results Review 4.5   Chloride 105  106 109  More results in Results Review 112*   CO2 _0 More results in Results Review 19*   BUN 22.0* 18.0 17.0  More results in Results Review 8.0   Creatinine 0.7 0.7 0.7  More results in Results Review 0.9   Glucose 106* 113* 88  More results in Results Review 109*   Calcium 10.0 9.8 9.9  More results in Results Review 9.4   Protein, Total  --   --   --   --  5.4*   Albumin  --   --   --   --  2.7*   AST (SGOT)  --   --   --   --  18   ALT  --   --   --   --  9   Alkaline Phosphatase  --   --   --   --  80   Bilirubin, Total  --   --   --   --  0.2   More results in Results Review = values in this interval not displayed.      Glucose POCT   Recent Labs   Lab 01/08/19  0411 01/07/19  0336 01/06/19  0326 01/05/19  0253 01/04/19  0449 01/03/19  0319 01/02/19  0103   Glucose 106* 113* 88 118* 128* 129* 155*        Recent Labs   Lab  01/02/19  0839 01/02/19  0506 01/01/19  2116   Troponin I <0.01 <0.01 <0.01         ABGs:    ABG CollectionSite   Date Value Ref Range Status   01/02/2019 Left Radl  Final     Allen's Test   Date Value Ref Range Status   01/02/2019 Yes  Final     pH, Arterial   Date Value Ref Range Status   01/02/2019 7.312 (L) 7.350 - 7.450 Final     pCO2, Arterial   Date Value Ref Range Status   01/02/2019 34.6 (L) 35.0 - 45.0 mmHg Final     pO2, Arterial   Date Value Ref Range Status   01/02/2019 85.5 80.0 - 90.0 mmHg Final     HCO3, Arterial   Date Value Ref Range Status   01/02/2019 17.0 (L) 23.0 - 29.0 mEq/L Final     Base Excess, Arterial   Date Value Ref Range Status   01/02/2019 -7.9 (L) -2.0 - 2.0 mEq/L Final     O2 Sat, Arterial   Date Value Ref Range Status   01/02/2019 96.4 95.0 - 100.0 % Final       Urinalysis        Invalid input(s): LEUKOCYTESUR      Rads:   No results found.    Quillian Quince, MD  01/08/2019  1:45 PM

## 2019-01-08 NOTE — Plan of Care (Addendum)
Problem: Safety  Goal: Patient will be free from injury during hospitalization  Outcome: Progressing  Flowsheets (Taken 01/08/2019 1451)  Patient will be free from injury during hospitalization : Assess patient's risk for falls and implement fall prevention plan of care per policy; Ensure appropriate safety devices are available at the bedside; Provide and maintain safe environment; Use appropriate transfer methods; Hourly rounding; Include patient/ family/ care giver in decisions related to safety     Problem: Nutrition  Goal: Nutritional intake is adequate  Outcome: Progressing  Flowsheets (Taken 01/08/2019 1451)  Nutritional intake is adequate: Monitor daily weights; Assist patient with meals/food selection; Allow adequate time for meals; Consult/collaborate with Clinical Nutritionist; Assess anorexia, appetite, and amount of meal/food tolerated     Problem: Pain  Goal: Pain at adequate level as identified by patient  Outcome: Progressing  Flowsheets (Taken 01/08/2019 1454)  Pain at adequate level as identified by patient: Identify patient comfort function goal; Assess for risk of opioid induced respiratory depression, including snoring/sleep apnea. Alert healthcare team of risk factors identified.; Evaluate if patient comfort function goal is met; Evaluate patient's satisfaction with pain management progress; Offer non-pharmacological pain management interventions; Assess pain on admission, during daily assessment and/or before any "as needed" intervention(s)     Patient A&Ox4, NSR-ST w/ HR low 100's, Denies any Chest pain, No Respiratory distress, On 1 Li/O2 , 95% , use BIPAP at night as needed. Skin is intact, out of bed to chair and was able to ambulate with physical therapy with walker. Discharge home with home health per recommendation. Complaint with back pain, given  Percocet  2 tablets, Pain level from 6, reassessed after one hour, pain level went down to 2. patient was comfortable. VQ scan done, No  Pulmonary embolism.   Plan:  Pain management, titrate oxygenation, activity level . Possible Discharge in 2 days 2/21, Per Dr. Mallie Darting.

## 2019-01-08 NOTE — UM Notes (Signed)
[facility] Utilization Review  NPI: [id number], Tax ID: [id number]  Please Call Krystal Eaton, MSN, RN @ [phone] with any questions or concerns. Confidential voicemail.  Email: [name]   Fax final authorizations and requests for additional information to [phone]    01/08/19 Continued Stay Review     SUBJECTIVE     Patient seen and examined at bedside with RN. Currently on 4L NC. Wore bipap overnight. Updated on plan of care.      Temp:  [97 F (36.1 C)-98.2 F (36.8 C)] 97.6 F (36.4 C)  Heart Rate:  [40-86] 84  Resp Rate:  [19-22] 22  BP: (100-116)/(59-75) 110/63     Intake/Output Summary (Last 24 hours) at 01/08/2019 1619  Last data filed at 01/08/2019 1155  Gross per 24 hour   Intake 684 ml   Output 1100 ml   Net -416 ml       Lab Results last 48 Hours     Procedure Component Value Units Date/Time    Glucose Whole Blood - POCT [[id number]]  (Abnormal) Collected:  01/08/19 1719     Updated:  01/08/19 1722     POCT - Glucose Whole blood 138 mg/dL     Glucose Whole Blood - POCT [[id number]]  (Abnormal) Collected:  01/08/19 1152     Updated:  01/08/19 1158     POCT - Glucose Whole blood 127 mg/dL     Procalcitonin [[id number]] Collected:  01/08/19 0411     Updated:  01/08/19 0943     Procalcitonin 0.02    Glucose Whole Blood - POCT [[id number]] Collected:  01/08/19 0707     Updated:  01/08/19 0715     POCT - Glucose Whole blood 82 mg/dL     B-type Natriuretic Peptide [[id number]] Collected:  01/08/19 0411    Specimen:  Blood Updated:  01/08/19 0624     B-Natriuretic Peptide 16 pg/mL     GFR [[id number]] Collected:  01/08/19 0411     Updated:  01/08/19 0607     EGFR >22.4    Basic Metabolic Panel [[id number]]  (Abnormal) Collected:  01/08/19 0411    Specimen:  Blood Updated:  01/08/19 0607     Glucose 106 mg/dL      BUN 22.0 mg/dL     Hemolysis index [[id number]] Collected:  01/08/19 0411     Updated:  01/08/19 0607     Hemolysis Index 9    CBC without differential [[id number]]  (Abnormal)  Collected:  01/08/19 0411    Specimen:  Blood Updated:  01/08/19 0549     WBC 11.94 x10 3/uL      Hgb 12.0 g/dL      Hematocrit 38.5 %      Platelets 364 x10 3/uL      MCHC 31.2 g/dL           Scheduled Meds:  Current Facility-Administered Medications  Medication Dose Route Frequency   albuterol-ipratropium  3 mL Nebulization Q4H SCH   aspirin  81 mg Oral Daily   doxycycline  100 mg Oral Q12H SCH   enoxaparin  0.5 mg/kg Subcutaneous Daily   fluticasone furoate-vilanterol  1 puff Inhalation QAM   gabapentin  800 mg Oral TID   guaiFENesin  600 mg Oral Q12H SCH   insulin lispro  1-3 Units Subcutaneous QHS   insulin lispro  1-5 Units Subcutaneous TID AC   lactobacillus/streptococcus  1 capsule Oral Daily   nicotine  1 patch Transdermal Daily   oxybutynin  5  mg Oral TID   pantoprazole  40 mg Oral QAM AC   [START ON 01/09/2019] predniSONE  50 mg Oral QAM W/BREAKFAST   Followed by   Derrill Memo ON 01/10/2019] predniSONE  40 mg Oral QAM W/BREAKFAST   Followed by   Derrill Memo ON 01/11/2019] predniSONE  30 mg Oral QAM W/BREAKFAST   Followed by   Derrill Memo ON 01/12/2019] predniSONE  20 mg Oral QAM W/BREAKFAST   Followed by   Derrill Memo ON 01/13/2019] predniSONE  10 mg Oral QAM W/BREAKFAST   risperiDONE  3 mg Oral BID   sertraline  100 mg Oral BID    PRN Meds:.acetaminophen **OR** acetaminophen, ALPRAZolam, alum & mag hydroxide-simethicone, benzonatate, Nursing communication: Adult Hypoglycemia Treatment Algorithm **AND** dextrose **AND** dextrose **AND** glucagon (rDNA), diphenhydrAMINE, guaiFENesin-dextromethorphan, influenza, naloxone, nitroglycerin, ondansetron **OR** ondansetron, oxyCODONE-acetaminophen, senna-docusate    ASSESSMENT/PLAN   Patient Active Hospital Problem List:   Multilobar pneumonia    Acute COPD exacerbation   OSA on CPAP qhs    History of CAD    Anxiety     Miri L Buckleris a 48 y.o.femalewith a history of COPD, active tobacco use, CAD/MI, and anxietypresenting with shortness of breath. she was  hospitalized for pneumonia 3 months ago in [place].   She is admitted for pneumonia and acute COPD exacerbation. Elevated d-dimer, CTA chest negative for PE but concerning for extensive multilobar pneumonia. venous lower extremity doppler negative for DVT. Lactic acid and procalcitonin normal.  Legionella, influenza, respiratory pathogen panel negative.  Echo shows LVEF 60-65%, moderate pulmonary hypertension. Pulmonology and ID consulted.  Status post cefepime and doxycyline.  Currently on doxycycline only    Status post VQ scan which showed low probability for pulmonary embolism.    #Acute hypoxic respiratory failure   #Multilobar Pneumonia   #Acute COPD exacerbation  -CTA chest negative for PE but concerning for extensive multilobar pneumonia.  -ID following, appreciate recs.  Patient's antibiotic changed to doxycycline  -Pulmonology following, appreciate recs  -Patient's oral prednisone on taper course  -sputum cx showing moderate growth of mixed upper respiratory flora, viral respiratory panel negative, influenza negative, Legionella negative  -Status post VQ scan which showed low probability for pulmonary embolism.  -continue supplemental oxygen, on 4 liters. BIPAP qhs   -continue breo, scheduled duonebs   -mucinex, incentive spirometry  -monitor on telemetry    #CAD  -EKG shows NSR, nonspecific T wave changes. Serial troponins x 3 negative.   -continue aspirin  -continue to monitor on telemetry    #Tobacco use  -advised cessation. Nicotine patch.     #OSA   -on cpap qhs      #Anxiety  -continue Resporal Zoloft and Neurontin    #Overactive bladder  -continue oxybutynin      Krystal Eaton, RN, MSN  Utilization Review Case Management  [phone] (F)

## 2019-01-08 NOTE — Progress Notes (Signed)
Infectious Diseases & Tropical Medicine  Progress Note    01/08/2019   [name] [id number] is a 48 y.o. female,       Assessment:      Multifocal pneumonia   COPD exacerbation   Sputum culture no growth to date   Chest x-ray-stable cardiomegaly/bilateral interstitial prominence (01/07/2019)   Procalcitonin level-0.02 (01/02/2019)   Procalcitonin level-0.02 (01/08/2019)   History of heavy smoking   Leukocytosis improving-on steroids   Morbid obesity    Plan:      Continue doxycycline x5 days   Discharge planning once cleared by pulmonary   Follow-up chest x-ray   Continue probiotics   Monitor electrolytes and renal functions closely   Monitor clinically      Discussed with patient in detail    ROS:     General:  no fever, no chills, no rigor, comfortable, feeling better  HEENT: no neck pain, no throat pain  Endocrine:  no fatigue, no night sweats  Respiratory: Cough and shortness of breath improving  Cardiovascular: no chest pain   Gastrointestinal: left lower abdominal muscle pain improving,no N/V/D  Genito-Urinary: no dysuria, increased urinary frequency, trouble voiding, or hematuria   Musculoskeletal: no edema, right hip pain improved  Neurological: c/o generalized weakness   Dermatological: no rash, no ulcer    Physical Examination:     Blood pressure 116/75, pulse 86, temperature 97 F (36.1 C), temperature source Oral, resp. rate 19, height 1.753 m (5' 9"), weight 148.8 kg (328 lb), SpO2 96 %.     General Appearance: Comfortable, and in no acute distress.   HEENT: Pupils are equal, round, and reactive to light.    Lungs: Scattered rhonchi   Heart:  Regular rate and rhythm   Chest: Symmetric chest wall expansion.    Abdomen: soft ,non tender,no hepatosplenomegaly,good bowel sounds   Neurological: No focal deficit   Extremities: No edema    Laboratory And Diagnostic Studies:     Recent Labs     01/08/19  0411 01/07/19  0336   WBC 11.94* 12.95*   Hgb 12.0 12.7    Hematocrit 38.5 40.5   Platelets 364* 360*     Recent Labs     01/08/19  0411 01/07/19  0336   Sodium 137 137   Potassium 4.8 5.1   Chloride 105 106   CO2 25 22   BUN 22.0* 18.0   Creatinine 0.7 0.7   Glucose 106* 113*   Calcium 10.0 9.8     No results for input(s): AST, ALT, ALKPHOS, PROT, ALB in the last 72 hours.    Current Meds:      Scheduled Meds: PRN Meds:    albuterol-ipratropium, 3 mL, Nebulization, Q4H SCH  aspirin, 81 mg, Oral, Daily  doxycycline, 100 mg, Oral, Q12H SCH  enoxaparin, 0.5 mg/kg, Subcutaneous, Daily  fluticasone furoate-vilanterol, 1 puff, Inhalation, QAM  gabapentin, 800 mg, Oral, TID  guaiFENesin, 600 mg, Oral, Q12H SCH  insulin lispro, 1-3 Units, Subcutaneous, QHS  insulin lispro, 1-5 Units, Subcutaneous, TID AC  lactobacillus/streptococcus, 1 capsule, Oral, Daily  nicotine, 1 patch, Transdermal, Daily  oxybutynin, 5 mg, Oral, TID  pantoprazole, 40 mg, Oral, QAM AC  predniSONE, 60 mg, Oral, QAM W/BREAKFAST  risperiDONE, 3 mg, Oral, BID  sertraline, 100 mg, Oral, BID        Continuous Infusions:   acetaminophen, 650 mg, Q4H PRN    Or  acetaminophen, 650 mg, Q4H PRN  ALPRAZolam, 0.5 mg, BID PRN  alum &  mag hydroxide-simethicone, 20 mL, Q4H PRN  benzonatate, 200 mg, TID PRN  dextrose, 15 g of glucose, PRN    And  glucagon (rDNA), 1 mg, PRN    And  dextrose, 125 mL, PRN  diphenhydrAMINE, 25 mg, Q6H PRN  guaiFENesin-dextromethorphan, 5 mL, Q6H PRN  influenza, 0.5 mL, Prior to discharge  naloxone, 0.2 mg, PRN  nitroglycerin, 0.4 mg, Q5 Min PRN  ondansetron, 4 mg, Q6H PRN    Or  ondansetron, 4 mg, Q6H PRN  oxyCODONE-acetaminophen, 2 tablet, Q6H PRN  senna-docusate, 2 tablet, QHS PRN          [name] A. Gustavus Messing, M.D.  01/08/2019  10:09 AM

## 2019-01-08 NOTE — Plan of Care (Signed)
Problem: Side Effects from Pain Analgesia  Goal: Patient will experience minimal side effects of analgesic therapy  Flowsheets (Taken 01/08/2019 1452 by Lewayne Bunting, RN)  Patient will experience minimal side effects of analgesic therapy: Monitor/assess patient's respiratory status (RR depth, effort, breath sounds);Assess for changes in cognitive function;Prevent/manage side effects per LIP orders (i.e. nausea, vomiting, pruritus, constipation, urinary retention, etc.)     Problem: Compromised Hemodynamic Status  Goal: Vital signs and fluid balance maintained/improved  Outcome: Progressing  Flowsheets (Taken 01/07/2019 2022 by Illene Silver B, RN)  Vital signs and fluid balance are maintained/improved: Position patient for maximum circulation/cardiac output;Monitor intake and output. Notify LIP if urine output is less than 30 mL/hour.;Monitor/assess vitals and hemodynamic parameters with position changes;Monitor and compare daily weight;Monitor/assess lab values and report abnormal values     Problem: Inadequate Gas Exchange  Goal: Adequate oxygenation and improved ventilation  Outcome: Progressing  Flowsheets (Taken 01/05/2019 1439 by Daun Peacock, RN)  Adequate oxygenation and improved ventilation: Assess lung sounds;Position for maximum ventilatory efficiency;Plan activities to conserve energy: plan rest periods;Monitor SpO2 and treat as needed;Teach/reinforce use of incentive spirometer 10 times per hour while awake, cough and deep breath as needed;Increase activity as tolerated/progressive mobility;Monitor and treat ETCO2;Provide mechanical and oxygen support to facilitate gas exchange;Consult/collaborate with Respiratory Therapy     Problem: Impaired Mobility  Goal: Mobility/Activity is maintained at optimal level for patient  Outcome: Progressing  Flowsheets (Taken 01/08/2019 2231)  Mobility/activity is maintained at optimal level for patient: Increase mobility as tolerated/progressive mobility;  Encourage independent activity per ability; Plan activities to conserve energy, plan rest periods; Assess for changes in respiratory status, level of consciousness and/or development of fatigue

## 2019-01-08 NOTE — Plan of Care (Signed)
Discharge Recommendation: Home with supervision, Home with home health OT, Home with home health PT   DME Recommended for Discharge: Reacher, Dressing stick, Long-handled sponge, Sock aid, Long-handled shoehorn, Grab bars, BSC    OT Frequency Recommended: 1-2x/wk     Next Visit Recommendation: OT - Next Visit Recommendation: 01/13/19       Is PT evaluation indicated at this time? This patient is already on PT caseload.     PMP Activity: Step 7 - Walks out of Room  Distance Walked (ft) (Step 6,7): 100 Feet  (Please See Therapy Evaluation for device and assistance level needed)    Treatment Interventions: ADL retraining, Functional transfer training, Endurance training, Patient/Family training, Equipment eval/education, Compensatory technique education       Goal Formulation: Patient  Time For Goal Achievement: by time of discharge  ADL Goals  Patient will groom self: Independent, at sinkside, by time of discharge, Goal met  Patient will dress lower body: Minimal Assist, with AE, by time of discharge, Not met  Patient will toilet: Independent, by time of discharge, Not met  Mobility and Transfer Goals  Pt will transfer bed to toilet: Modified Independent, with rolling walker, by time of discharge, Not met        Executive Fucntion Goals  Pt will follow energy conservation techniques: with verbalization of 3 ECTs to be used during ADLs, by time of discharge, Partly met            Iva Lento & W [phone]  [phone]    01/08/2019  1:58 PM

## 2019-01-08 NOTE — Progress Notes (Signed)
[facility]  PROGRESS NOTE      Patient: [name]  Date: 01/08/2019   LOS: 6 Days  Admission Date: 01/01/2019   MRN: [id number]  Attending: Marya Landry  Please contact me on Perfect[employer]            SUBJECTIVE     Patient seen and examined at bedside with RN. Currently on 4L NC. Wore bipap overnight.   Reports feeling little better today. Updated on plan of care.   Denies chest pain, abdominal pain, nausea, vomiting, diarrhea.        OBJECTIVE     Vitals:    01/08/19 1151   BP: 112/72   Pulse: (!) 40   Resp: 20   Temp: 97.9 F (36.6 C)   SpO2: 92%       Temperature: Temp  Min: 97 F (36.1 C)  Max: 98.2 F (36.8 C)  Pulse: Pulse  Min: 40  Max: 86  Respiratory: Resp  Min: 19  Max: 22  Non-Invasive BP: BP  Min: 92/54  Max: 116/75  Pulse Oximetry SpO2  Min: 92 %  Max: 97 %    Intake and Output Summary (Last 24 hours) at Date Time    Intake/Output Summary (Last 24 hours) at 01/08/2019 1619  Last data filed at 01/08/2019 1155  Gross per 24 hour   Intake 684 ml   Output 1100 ml   Net -416 ml       PHYSICAL EXAMINATION  GEN APPEARANCE: chronically ill-appearing in NAD.  Morbidly obese female alert and cooperative.   Occasionally coughing.    HEENT: NCAT, EOMI, PERRL, no nasal discharge.   conjunctivae/corneas clear. Oral mucosa moist.   Neck: Supple without meningismus   CVS: RRR, S1, S2. No murmur   LUNGS: coarse breath sounds with expiratory wheezes, symmetric air entry. On O2.   ABD: BS+, soft, obese, NT, ND, no guarding or rigidity  EXT: No edema; Pulses 2+ and intact  NEURO: AAOx3, CN 2-12 intact.  No focal neurological deficits  MENTAL STATUS: anxious otherwise appropriate affect and mood      MEDICATIONS     Current Facility-Administered Medications   Medication Dose Route Frequency    albuterol-ipratropium  3 mL Nebulization Q4H SCH    aspirin  81 mg Oral Daily    doxycycline  100 mg Oral Q12H SCH    enoxaparin  0.5 mg/kg Subcutaneous Daily    fluticasone furoate-vilanterol  1 puff Inhalation QAM     gabapentin  800 mg Oral TID    guaiFENesin  600 mg Oral Q12H SCH    insulin lispro  1-3 Units Subcutaneous QHS    insulin lispro  1-5 Units Subcutaneous TID AC    lactobacillus/streptococcus  1 capsule Oral Daily    nicotine  1 patch Transdermal Daily    oxybutynin  5 mg Oral TID    pantoprazole  40 mg Oral QAM AC    [START ON 01/09/2019] predniSONE  50 mg Oral QAM W/BREAKFAST    Followed by    Derrill Memo ON 01/10/2019] predniSONE  40 mg Oral QAM W/BREAKFAST    Followed by    Derrill Memo ON 01/11/2019] predniSONE  30 mg Oral QAM W/BREAKFAST    Followed by    Derrill Memo ON 01/12/2019] predniSONE  20 mg Oral QAM W/BREAKFAST    Followed by    Derrill Memo ON 01/13/2019] predniSONE  10 mg Oral QAM W/BREAKFAST    risperiDONE  3 mg Oral BID  sertraline  100 mg Oral BID            LABS/IMAGING     Recent Labs   Lab 01/08/19  0411 01/07/19  0336 01/06/19  0326   WBC 11.94* 12.95* 16.06*   RBC 4.22 4.35 4.08   Hgb 12.0 12.7 11.9   Hematocrit 38.5 40.5 37.4   MCV 91.2 93.1 91.7   Platelets 364* 360* 335       Recent Labs   Lab 01/08/19  0411 01/07/19  0336 01/06/19  0326 01/05/19  0253 01/04/19  0449   Sodium 137 137 140 140 141   Potassium 4.8 5.1 4.3 4.9 4.9   Chloride 105 106 109 111 111   CO2 _0 BUN 22.0* 18.0 17.0 14.0 13.0   Creatinine 0.7 0.7 0.7 0.7 0.7   Glucose 106* 113* 88 118* 128*   Calcium 10.0 9.8 9.9 10.2 10.0       Recent Labs   Lab 01/01/19  2116   ALT 9   AST (SGOT) 18   Bilirubin, Total 0.2   Albumin 2.7*   Alkaline Phosphatase 80       Recent Labs   Lab 01/02/19  0839 01/02/19  0506 01/01/19  2116   Troponin I <0.01 <0.01 <0.01             Microbiology Results     Procedure Component Value Units Date/Time    Legionella antigen, urine [[id number]] Collected:  01/02/19 0120    Specimen:  Urine, Clean Catch Updated:  01/02/19 1146    Narrative:       ORDER#: [id number]                                    ORDERED BY: Gloris Manchester  SOURCE: Urine, Clean Catch                           COLLECTED:   01/02/19 01:20  ANTIBIOTICS AT COLL.:                                RECEIVED :  01/02/19 04:11  Legionella, Rapid Urinary Antigen          FINAL       01/02/19 11:46  01/02/19   Negative for Legionella pneumophila Serogroup 1 Antigen             Limitations of Test:             1. Negative results do not exclude infection with Legionella                pneumophila Serogroup 1.             2. Does not detect other serogroups of L. pneumophila                or other Legionella species.             Test Reference Range: Negative      Rapid influenza A/B antigens [[id number]] Collected:  01/02/19 0104    Specimen:  Nasopharyngeal from Nasal Aspirate Updated:  01/02/19 0138    Narrative:       ORDER#: [id number]  ORDERED BY: Gloris Manchester  SOURCE: Nasal Aspirate                               COLLECTED:  01/02/19 01:04  ANTIBIOTICS AT COLL.:                                RECEIVED :  01/02/19 01:19  Influenza Rapid Antigen A&B                FINAL       01/02/19 01:38  01/02/19   Negative for Influenza A and B             Reference Range: Negative      Respiratory Pathogen Panel, PCR (Film Array) [[id number]] Collected:  01/02/19 0104     Updated:  01/02/19 0644     Adenovirus Not Detected     Coronavirus 229E Not Detected     Coronavirus HKU1 Not Detected     Coronavirus NL63 Not Detected     Coronavirus OC43 Not Detected     Human Metapneumovirus Not Detected     Human Rhinovirus/Enterovirus Not Detected     Influenza A Not Detected     Influenza A/H1 Not Detected     Influenza AH1 - 2009 Not Detected     Influenza A/H3 Not Detected     Influenza B Not Detected     Parainfluenza Virus 1 Not Detected     Parainfluenza Virus 2 Not Detected     Parainfluenza Virus 3 Not Detected     Parainfluenza Virus 4 Not Detected     Respiratory Syncytial Virus Not Detected     Bordetella pertussis Not Detected     Chlamydophila pneumoniae Not Detected     Mycoplasma pneumoniae Not Detected      Comment: Multiplex nucleic acid amplification assay for detection of  18 respiratory viruses and bacteria. This assay cannot  differentiate Rhinovirus/Enterovirus. If necessary for  patient care, a positive result for Rhinovirus/Enterovirus  may be followed-up using an alternate method.  Viral and bacterial nucleic acids may persist even though no  viable organism is present. Detection of nucleic acid does  not imply that the corresponding organisms are infectious or  are the causative agents of clinical symptoms. A negative  result does not exclude the possibility of viral or  bacterial infection. Performance characteristics may vary  with circulating strains. The assay may not be able to  distinguish between existing viral strains and new variants  as they emerge.The performance of this test has not been  established in individuals who received influenza vaccine.  Recent administration of a nasal influenza vaccine may cause  false positive results for Influenza A and/or Influenza B.  This assay is FDA cleared for nasopharyngeal swab samples.  Performance characteristics for Bronchoalveolar lavage  samples have been determined by the [facility] laboratory. Other  sample types are unacceptable.          Source NP Swab             Chest 2 Views    Result Date: 01/01/2019   Patchy bilateral perihilar opacities are nonspecific. Atypical infection cannot be excluded. Clinical correlation and continued follow-up is recommended. [name], MD 01/01/2019 10:01 PM    Ct Angiogram Chest    Result Date: 01/02/2019   1. This is a limited study due  to multiple factors including patient motion and suboptimal bolus timing. No large central filling defect within the pulmonary arteries. 2. There is mosaic attenuation and air trapping worsened mid to upper lobe distribution. While this is a nonspecific finding but can be associated with many disease entities and etiologies, it can be seen with occlusive vascular disease such as chronic  thromboembolic disease. Jeanella Cara, MD 01/02/2019 3:19 PM    Nm Pulmonary Ventilation And Perfusion (aerosol Or Gas)    Result Date: 01/08/2019   VERY LOW PROBABILITY FOR PULMONARY EMBOLISM. Leonard Downing, MD 01/08/2019 3:48 PM    Xr Chest Ap Portable    Result Date: 01/07/2019   Stable cardiomegaly. Stable bilateral interstitial prominence. Trudie Reed, MD 01/07/2019 9:50 AM    Xr Chest Ap Portable    Result Date: 01/06/2019   Stable diffuse lung disease Elyn Peers, MD 01/06/2019 12:10 PM    Xr Chest Ap Portable    Result Date: 01/03/2019   Pulmonary edema pattern without change which may be seen with CHF or multifocal pneumonia Elyn Peers, MD 01/03/2019 9:46 AM    US Venous Low Extrem Duplx Dopp Comp Bilat    Result Date: 01/03/2019   No ultrasound evidence of DVT in either lower extremity. Graciella Freer, MD 01/03/2019 5:14 PM    Echo  Summary    * Left ventricular ejection fraction is normal with an estimated ejection  fraction of 60-65%.    * The left ventricle is mildly dilated.    * Left ventricular wall thickness is normal.    * Left ventricular diastolic filling parameters demonstrate normal diastolic  function.    * The right ventricular cavity size is dilated.    * Normal right ventricular systolic function.    * Moderate pulmonary hypertension with estimated right ventricular systolic  pressure of  54 mmHg.    * The left atrium is mildly dilated.    * There is no aortic stenosis.    * There is no aortic regurgitation.    * There is mild mitral regurgitation.    * The aortic root is mildly dilated.    * The IVC is mildly dilated to  2.26 cm.    * There is a small pericardial effusion.       ASSESSMENT/PLAN   Patient Active Hospital Problem List:   Multilobar pneumonia    Acute COPD exacerbation   OSA on CPAP qhs    History of CAD    Anxiety            ISABEAU MCCALLA is a 48 y.o. female with a history of COPD, active tobacco use, CAD/MI, and anxiety presenting with shortness of breath. she was  hospitalized for pneumonia 3 months ago in [place].   She is admitted for pneumonia and acute COPD exacerbation. Elevated d-dimer, CTA chest negative for PE but concerning for extensive multilobar pneumonia. venous lower extremity doppler negative for DVT. Lactic acid and procalcitonin normal.  Legionella, influenza, respiratory pathogen panel negative.  Echo shows LVEF 60-65%, moderate pulmonary hypertension. Pulmonology and ID consulted.  Status post cefepime and doxycyline.  Currently on doxycycline only    Status post VQ scan which showed low probability for pulmonary embolism.      #Acute hypoxic respiratory failure   #Multilobar Pneumonia   #Acute COPD exacerbation  -CTA chest negative for PE but concerning for extensive multilobar pneumonia.  -ID following, appreciate recs.  Patient's antibiotic changed to doxycycline  -  Pulmonology following, appreciate recs  -Patient's oral prednisone on taper course  -sputum cx showing moderate growth of mixed upper respiratory flora, viral respiratory panel negative, influenza negative, Legionella negative  -Status post VQ scan which showed low probability for pulmonary embolism.  -continue supplemental oxygen, on 4 liters. BIPAP qhs   -continue breo, scheduled duonebs   -mucinex, incentive spirometry  -monitor on telemetry    #CAD  -EKG shows NSR, nonspecific T wave changes. Serial troponins x 3 negative.   -continue aspirin  -continue to monitor on telemetry    #Tobacco use  -advised cessation. Nicotine patch.     #OSA   -on cpap qhs      #Anxiety  -continue Resporal Zoloft and Neurontin    #Overactive bladder  -continue oxybutynin      Code: FULL   DVT prophylaxis: lovenox  Disposition: continues to require inpatient hospitalization.           Signed,  Marya Landry           This chart was generated using hospital voice-recognition software which does not employ spell-checking or grammar-checking features. It was dictated, all or in part, in a busy and often noisy  patient care environment. I have taken all usual measures to dictate carefully and to review all aspects this chart. Nonetheless, given the known and well-documented performance characteristics of VR software in such patient care environments, this dictation still may contain unrecognized and wholly unintended errors or omissions

## 2019-01-08 NOTE — OT Progress Note (Signed)
[facility]  Occupational Therapy Treatment     Patient: [name]     MRN#: [id number]   Unit: [place] [facility]  Bed: A2515/A2515-01    Time of treatment:  Time Calculation  OT Received On: 01/08/19  Start Time: 1320  Stop Time: 1351  Time Calculation (min): 31 min             Chart Review and Collaboration with Care Team: 5 minutes, not included in above time.    OT Visit Number: 2    Precautions and Contraindications:    Precautions  Weight Bearing Status: no restrictions  Other Precautions: Falls    Updated Labs:  Lab Results   Component Value Date/Time    HGB 12.0 01/08/2019 04:11 AM    HCT 38.5 01/08/2019 04:11 AM    K 4.8 01/08/2019 04:11 AM    NA 137 01/08/2019 04:11 AM    TROPI <0.01 01/02/2019 08:39 AM    TROPI <0.01 01/02/2019 05:06 AM    TROPI <0.01 01/01/2019 09:16 PM       All imaging reviewed, please see chart for details.    Subjective:    "I'm feeling much better."  Patient Goal: To walk.    Patient's medical condition is appropriate for Occupational Therapy intervention at this time.  Patient is agreeable to participation in the therapy session. Nursing clears patient for therapy.  Pain Assessment  Pain Assessment: No/denies pain      Objective:  Observation of Patient/Vital Signs:    VSS    Patient received in bed with O2 at 2 liters/minute via NC in place.    Cognition/Neuro Status  Arousal/Alertness: Appropriate responses to stimuli  Attention Span: Appears intact  Orientation Level: Oriented X4  Memory: Appears intact  Following Commands: Follows all commands and directions without difficulty  Safety Awareness: independent  Insights: Fully aware of deficits  Problem Solving: Able to problem solve independently  Behavior: attentive;calm;cooperative  Motor Planning: intact  Coordination: intact    Functional Mobility  Supine to Sit Transfers: Modified Independent(HOB elevated)  Sit to Stand Transfers: Supervision  Functional Mobility/Ambulation: Supervision(with  bariatric walker, 100 feet)    Self-care and Home Management  Eating: Independent;in a chair  Grooming: Independent;standing at sink;teeth care     Instruction provided throughout for energy conservation; verbal reminders for pursed lip breathing.  Pt SOB with activity.                               Educated the patient to role of occupational therapy, plan of care, goals of therapy and safety with mobility and ADLs, energy conservation techniques, discharge instructions, home safety.     Patient left in bedside chair with oxygen in place and call bell and all personal items/needs within reach. RN notified of session outcome.       Assessment:  Pt demonstrated improved activity tolerance this date; able to complete grooming task standing at sink, as well as functional mobility in hall.  Plan continues to be return to home with home health therapies.    PMP Activity: Step 7 - Walks out of Room  Distance Walked (ft) (Step 6,7): 100 Feet      Plan:  Goal Formulation: Patient  OT Plan  Risks/Benefits/POC Discussed with Pt/Family: With patient  Treatment Interventions: ADL retraining, Functional transfer training, Endurance training, Patient/Family training, Equipment eval/education, Compensatory technique education  Discharge Recommendation: Home with supervision, Home  with home health OT, Home with home health PT  DME Recommended for Discharge: Reacher, Dressing stick, Long-handled sponge, Sock aid, Long-handled shoehorn, Grab bars, BSC  OT Frequency Recommended: 1-2x/wk  OT - Next Visit Recommendation: 01/13/19    Time For Goal Achievement: by time of discharge  ADL Goals  Patient will groom self: Independent, at sinkside, by time of discharge, Goal met  Patient will dress lower body: Minimal Assist, with AE, by time of discharge, Not met  Patient will toilet: Independent, by time of discharge, Not met  Mobility and Transfer Goals  Pt will transfer bed to toilet: Modified Independent, with rolling walker, by time of  discharge, Not met        Executive Fucntion Goals  Pt will follow energy conservation techniques: with verbalization of 3 ECTs to be used during ADLs, by time of discharge, Partly met              Continue plan of care.       DME Recommended for Discharge: Reacher, Dressing stick, Long-handled sponge, Sock aid, Long-handled shoehorn, Grab bars, BSC  Discharge Recommendation: Home with supervision, Home with home health OT, Home with home health PT      Iva Lento & Viona Gilmore [phone]  [phone]    01/08/2019  1:56 PM

## 2019-01-09 LAB — CBC
Absolute NRBC: 0 10*3/uL (ref 0.00–0.00)
Hematocrit: 37.2 % (ref 34.7–43.7)
Hgb: 11.8 g/dL (ref 11.4–14.8)
MCH: 29.1 pg (ref 25.1–33.5)
MCHC: 31.7 g/dL (ref 31.5–35.8)
MCV: 91.9 fL (ref 78.0–96.0)
MPV: 10.1 fL (ref 8.9–12.5)
Nucleated RBC: 0 /100 WBC (ref 0.0–0.0)
Platelets: 363 10*3/uL — ABNORMAL HIGH (ref 142–346)
RBC: 4.05 10*6/uL (ref 3.90–5.10)
RDW: 14 % (ref 11–15)
WBC: 13.97 10*3/uL — ABNORMAL HIGH (ref 3.10–9.50)

## 2019-01-09 LAB — BASIC METABOLIC PANEL
Anion Gap: 9 (ref 5.0–15.0)
BUN: 17 mg/dL (ref 7.0–19.0)
CO2: 24 mEq/L (ref 22–29)
Calcium: 9.2 mg/dL (ref 8.5–10.5)
Chloride: 105 mEq/L (ref 100–111)
Creatinine: 0.7 mg/dL (ref 0.6–1.0)
Glucose: 108 mg/dL — ABNORMAL HIGH (ref 70–100)
Potassium: 4.4 mEq/L (ref 3.5–5.1)
Sodium: 138 mEq/L (ref 136–145)

## 2019-01-09 LAB — GLUCOSE WHOLE BLOOD - POCT
Whole Blood Glucose POCT: 83 mg/dL (ref 70–100)
Whole Blood Glucose POCT: 110 mg/dL — ABNORMAL HIGH (ref 70–100)

## 2019-01-09 LAB — GFR: EGFR: >60

## 2019-01-09 LAB — HEMOLYSIS INDEX: Hemolysis Index: 5 (ref 0–18)

## 2019-01-09 NOTE — Progress Notes (Signed)
Infectious Diseases [facility] T[facility] Medicine  Progress Note    01/09/2019   [name] SLP:[id number],[id number] is a 48 y.o. female,       Assessment:      Multifocal pneumonia   COPD exacerbation   Sputum culture no growth to date   Chest x-ray-stable cardiomegaly/bilateral interstitial prominence (01/07/2019)   Procalcitonin level-0.02 (01/02/2019)   Procalcitonin level-0.02 (01/08/2019)   History of heavy smoking   Leukocytosis-on steroids   Morbid obesity   Clinically improved    Plan:      Continue doxycycline x4 more days   Discharge planning once cleared by pulmonary   Follow-up chest x-ray   Continue probiotics   Monitor electrolytes and renal functions closely   Monitor clinically      Discussed with patient in detail    ROS:     General:  no fever, no chills, no rigor, comfortable, feeling better  HEENT: no neck pain, no throat pain  Endocrine:  no fatigue, no night sweats  Respiratory: Cough and shortness of breath improving  Cardiovascular: no chest pain   Gastrointestinal: left lower abdominal muscle pain improving,no N/V/D  Genito-Urinary: no dysuria, increased urinary frequency, trouble voiding, or hematuria   Musculoskeletal: no edema, right hip pain improved  Neurological: c/o generalized weakness   Dermatological: no rash, no ulcer    Physical Examination:     Blood pressure 105/69, pulse 63, temperature (!) 96.5 F (35.8 C), temperature source Oral, resp. rate 17, height 1.753 m (_0 ), weight 148.8 kg (328 lb), SpO2 95 %.     General Appearance: Comfortable, and in no acute distress.   HEENT: Pupils are equal, round, and reactive to light.    Lungs: Decreased breath sounds   Heart:  Regular rate and rhythm   Chest: Symmetric chest wall expansion.    Abdomen: soft ,non tender,no hepatosplenomegaly,good bowel sounds   Neurological: No focal deficit   Extremities: No edema    Laboratory And Diagnostic Studies:     Recent Labs     01/09/19  0319 01/08/19  0411   WBC  13.97* 11.94*   Hgb 11.8 12.0   Hematocrit 37.2 38.5   Platelets 363* 364*     Recent Labs     01/09/19  0319 01/08/19  0411   Sodium 138 137   Potassium 4.4 4.8   Chloride 105 105   CO2 24 25   BUN 17.0 22.0*   Creatinine 0.7 0.7   Glucose 108* 106*   Calcium 9.2 10.0     No results for input(s): AST, ALT, ALKPHOS, PROT, ALB in the last 72 hours.    Current Meds:      Scheduled Meds: PRN Meds:    albuterol-ipratropium, 3 mL, Nebulization, Q4H SCH  aspirin, 81 mg, Oral, Daily  doxycycline, 100 mg, Oral, Q12H SCH  enoxaparin, 0.5 mg/kg, Subcutaneous, Daily  fluticasone furoate-vilanterol, 1 puff, Inhalation, QAM  gabapentin, 800 mg, Oral, TID  guaiFENesin, 600 mg, Oral, Q12H SCH  insulin lispro, 1-3 Units, Subcutaneous, QHS  insulin lispro, 1-5 Units, Subcutaneous, TID AC  lactobacillus/streptococcus, 1 capsule, Oral, Daily  nicotine, 1 patch, Transdermal, Daily  oxybutynin, 5 mg, Oral, TID  pantoprazole, 40 mg, Oral, QAM AC  predniSONE, 50 mg, Oral, QAM W/BREAKFAST    Followed by  Derrill Memo ON 01/10/2019] predniSONE, 40 mg, Oral, QAM W/BREAKFAST    Followed by  Derrill Memo ON 01/11/2019] predniSONE, 30 mg, Oral, QAM W/BREAKFAST    Followed by  [START ON 01/12/2019] predniSONE, 20  mg, Oral, QAM W/BREAKFAST    Followed by  Derrill Memo ON 01/13/2019] predniSONE, 10 mg, Oral, QAM W/BREAKFAST  risperiDONE, 3 mg, Oral, BID  sertraline, 100 mg, Oral, BID        Continuous Infusions:   acetaminophen, 650 mg, Q4H PRN    Or  acetaminophen, 650 mg, Q4H PRN  ALPRAZolam, 0.5 mg, BID PRN  alum & mag hydroxide-simethicone, 20 mL, Q4H PRN  benzonatate, 200 mg, TID PRN  dextrose, 15 g of glucose, PRN    And  glucagon (rDNA), 1 mg, PRN    And  dextrose, 125 mL, PRN  diphenhydrAMINE, 25 mg, Q6H PRN  guaiFENesin-dextromethorphan, 5 mL, Q6H PRN  influenza, 0.5 mL, Prior to discharge  naloxone, 0.2 mg, PRN  nitroglycerin, 0.4 mg, Q5 Min PRN  ondansetron, 4 mg, Q6H PRN    Or  ondansetron, 4 mg, Q6H PRN  oxyCODONE-acetaminophen, 2 tablet, Q6H  PRN  senna-docusate, 2 tablet, QHS PRN          [name] A. Gustavus Messing, M.D.  01/09/2019  8:09 AM

## 2019-01-09 NOTE — Progress Notes (Signed)
PULMONARY PROGRESS NOTE                                                                                                              [phone]    Date Time: 01/09/19 4:10 PM  Patient Name: [name], [name] 48 y.o. female admitted with <principal problem not specified>  Admit Date: 01/01/2019    Patient status: Inpatient  Hospital Day: 7           Assessment:     Acute hypoxic respiratory failure on oxygen support  Multilobar pneumonia  Pulmonary embolism ruled out patient  COPD exacerbation  Morbid obesity  Obstructive sleep apnea    Plan:       Broad-spectrum antibiotic  Oral corticosteroid  Supplemental oxygen  Patient had a VQ scan which was normal which ruled out evidence of pulmonary embolism  BiPAP management  Bronchodilators  Inhaled corticosteroid      Reason for contd. Hospitalization = treatment of respiratory failure COPD exacerbation and sleep apnea  Subjective:       Patient has decreased shortness of breath still on supplemental oxygen            Medications:     Current Facility-Administered Medications   Medication Dose Route Frequency    albuterol-ipratropium  3 mL Nebulization Q4H SCH    aspirin  81 mg Oral Daily    doxycycline  100 mg Oral Q12H [place]    enoxaparin  0.5 mg/kg Subcutaneous Daily    fluticasone furoate-vilanterol  1 puff Inhalation QAM    gabapentin  800 mg Oral TID    guaiFENesin  600 mg Oral Q12H [place]    insulin lispro  1-3 Units Subcutaneous QHS    insulin lispro  1-5 Units Subcutaneous TID AC    lactobacillus/streptococcus  1 capsule Oral Daily    nicotine  1 patch Transdermal Daily    oxybutynin  5 mg Oral TID    pantoprazole  40 mg Oral QAM AC    [START ON 01/10/2019] predniSONE  40 mg Oral QAM W/BREAKFAST    Followed by    Derrill Memo ON 01/11/2019] predniSONE  30 mg Oral QAM W/BREAKFAST    Followed by    Derrill Memo ON 01/12/2019] predniSONE  20 mg Oral QAM W/BREAKFAST    Followed by    Derrill Memo ON 01/13/2019] predniSONE  10 mg Oral QAM W/BREAKFAST    risperiDONE  3 mg Oral  BID    sertraline  100 mg Oral BID       Review of Systems:        General ROS:  Afebrile    ENT ROS:  No sore throat no nasal discharge   Endocrine ROS:   fatigue   Respiratory ROS: Decreasing shortness of breath wheezing cough chest congestion    Cardiovascular ROS:  No chest pain or palpitation   Gastrointestinal ROS:  No nausea vomiting diarrhea.  No melanotic stool   Genito-Urinary ROS:  No burning in the urine or hematuria   Musculoskeletal ROS:  No musculoskeletal deformities  Neurological ROS:  No stroke seizure disorder   Dermatological ROS:  No skin rash        Physical Exam:     Vitals:    01/09/19 1251   BP: 99/52   Pulse:    Resp: 19   Temp: 98.8 F (37.1 C)   SpO2: 95%         Intake/Output Summary (Last 24 hours) at 01/09/2019 1610  Last data filed at 01/09/2019 1332  Gross per 24 hour   Intake    Output 602 ml   Net -602 ml           General appearance -mild respiratory distress  Mental status -  Alert and oriented x3  Eyes - EOMI PERRLA  Nose - no nasal discharge  Mouth - mucous membrane is moist.  No evidence of sore throat  Neck - no JVD lymphadenopathy or thyromegaly  Chest -no wheezing relatively clear breath sounds  Heart - S1-S2 RRR no S3-S4 no murmur  Abdomen - soft nontender bowel sounds are normal with no hepatosplenomegaly  Neurological - no motor or sensory deficit  Extremities - no edema clubbing or cyanosis  Skin - no skin rash      Labs:     CBC w/Diff CMP   Recent Labs   Lab 01/09/19  0319 01/08/19  0411 01/07/19  0336   WBC 13.97* 11.94* 12.95*   Hgb 11.8 12.0 12.7   Hematocrit 37.2 38.5 40.5   Platelets 363* 364* 360*   MCV 91.9 91.2 93.1       PT/INR         Recent Labs   Lab 01/09/19  0319 01/08/19  0411 01/07/19  0336   Sodium 138 137 137   Potassium 4.4 4.8 5.1   Chloride 105 105 106   CO2 _0 BUN 17.0 22.0* 18.0   Creatinine 0.7 0.7 0.7   Glucose 108* 106* 113*   Calcium 9.2 10.0 9.8      Glucose POCT   Recent Labs   Lab 01/09/19  0319 01/08/19  0411  01/07/19  0336 01/06/19  0326 01/05/19  0253 01/04/19  0449 01/03/19  0319   Glucose 108* 106* 113* 88 118* 128* 129*                ABGs:    ABG CollectionSite   Date Value Ref Range Status   01/02/2019 Left Radl  Final     Allen's Test   Date Value Ref Range Status   01/02/2019 Yes  Final     pH, Arterial   Date Value Ref Range Status   01/02/2019 7.312 (L) 7.350 - 7.450 Final     pCO2, Arterial   Date Value Ref Range Status   01/02/2019 34.6 (L) 35.0 - 45.0 mmHg Final     pO2, Arterial   Date Value Ref Range Status   01/02/2019 85.5 80.0 - 90.0 mmHg Final     HCO3, Arterial   Date Value Ref Range Status   01/02/2019 17.0 (L) 23.0 - 29.0 mEq/L Final     Base Excess, Arterial   Date Value Ref Range Status   01/02/2019 -7.9 (L) -2.0 - 2.0 mEq/L Final     O2 Sat, Arterial   Date Value Ref Range Status   01/02/2019 96.4 95.0 - 100.0 % Final       Urinalysis        Invalid input(s): LEUKOCYTESUR  Rads:   No results found.    Quillian Quince, MD  01/09/2019  4:10 PM

## 2019-01-09 NOTE — Progress Notes (Signed)
[facility]  PROGRESS NOTE      Patient: [name]  Date: 01/09/2019   LOS: 7 Days  Admission Date: 01/01/2019   MRN: [id number]  Attending: Marya Landry  Please contact me on Perfect[employer]            SUBJECTIVE     Patient seen and examined at bedside with RN. Currently on 1 L NC.   Reports feeling much better today. Updated on plan of care.   Denies chest pain, abdominal pain, nausea, vomiting, diarrhea.        OBJECTIVE     Vitals:    01/09/19 1500   BP: 94/65   Pulse:    Resp: 18   Temp: 98.9 F (37.2 C)   SpO2: 94%       Temperature: Temp  Min: 96.5 F (35.8 C)  Max: 98.9 F (37.2 C)  Pulse: Pulse  Min: 63  Max: 77  Respiratory: Resp  Min: 17  Max: 20  Non-Invasive BP: BP  Min: 94/65  Max: 107/60  Pulse Oximetry SpO2  Min: 93 %  Max: 96 %    Intake and Output Summary (Last 24 hours) at Date Time    Intake/Output Summary (Last 24 hours) at 01/09/2019 1802  Last data filed at 01/09/2019 1332  Gross per 24 hour   Intake    Output 2 ml   Net -2 ml       PHYSICAL EXAMINATION  GEN APPEARANCE: chronically ill-appearing in NAD.  Morbidly obese female alert and cooperative.   Occasionally coughing.    HEENT: NCAT, EOMI, PERRL, no nasal discharge.  Nasal cannula noted  conjunctivae/corneas clear. Oral mucosa moist.   Neck: Supple without meningismus   CVS: RRR, S1, S2. No murmur   LUNGS: Improving breath sounds with no wheezes, symmetric air entry. On O2.   ABD: BS+, soft, obese, NT, ND, no guarding or rigidity  EXT: No edema; Pulses 2+ and intact  NEURO: AAOx3, CN 2-12 intact.  No focal neurological deficits  MENTAL STATUS: Normal.  Appropriate affect and mood      MEDICATIONS     Current Facility-Administered Medications   Medication Dose Route Frequency    albuterol-ipratropium  3 mL Nebulization Q4H SCH    aspirin  81 mg Oral Daily    doxycycline  100 mg Oral Q12H SCH    enoxaparin  0.5 mg/kg Subcutaneous Daily    fluticasone furoate-vilanterol  1 puff Inhalation QAM    gabapentin  800 mg Oral TID     guaiFENesin  600 mg Oral Q12H SCH    insulin lispro  1-3 Units Subcutaneous QHS    insulin lispro  1-5 Units Subcutaneous TID AC    lactobacillus/streptococcus  1 capsule Oral Daily    nicotine  1 patch Transdermal Daily    oxybutynin  5 mg Oral TID    pantoprazole  40 mg Oral QAM AC    [START ON 01/10/2019] predniSONE  40 mg Oral QAM W/BREAKFAST    Followed by    Derrill Memo ON 01/11/2019] predniSONE  30 mg Oral QAM W/BREAKFAST    Followed by    Derrill Memo ON 01/12/2019] predniSONE  20 mg Oral QAM W/BREAKFAST    Followed by    Derrill Memo ON 01/13/2019] predniSONE  10 mg Oral QAM W/BREAKFAST    risperiDONE  3 mg Oral BID    sertraline  100 mg Oral BID            LABS/IMAGING  Recent Labs   Lab 01/09/19  0319 01/08/19  0411 01/07/19  0336   WBC 13.97* 11.94* 12.95*   RBC 4.05 4.22 4.35   Hgb 11.8 12.0 12.7   Hematocrit 37.2 38.5 40.5   MCV 91.9 91.2 93.1   Platelets 363* 364* 360*       Recent Labs   Lab 01/09/19  0319 01/08/19  0411 01/07/19  0336 01/06/19  0326 01/05/19  0253   Sodium 138 137 137 140 140   Potassium 4.4 4.8 5.1 4.3 4.9   Chloride 105 105 106 109 111   CO2 _0 BUN 17.0 22.0* 18.0 17.0 14.0   Creatinine 0.7 0.7 0.7 0.7 0.7   Glucose 108* 106* 113* 88 118*   Calcium 9.2 10.0 9.8 9.9 10.2                         Microbiology Results     Procedure Component Value Units Date/Time    Legionella antigen, urine [[id number]] Collected:  01/02/19 0120    Specimen:  Urine, Clean Catch Updated:  01/02/19 1146    Narrative:       ORDER#: [id number]                                    ORDERED BY: Gloris Manchester  SOURCE: Urine, Clean Catch                           COLLECTED:  01/02/19 01:20  ANTIBIOTICS AT COLL.:                                RECEIVED :  01/02/19 04:11  Legionella, Rapid Urinary Antigen          FINAL       01/02/19 11:46  01/02/19   Negative for Legionella pneumophila Serogroup 1 Antigen             Limitations of Test:             1. Negative results do not exclude infection with  Legionella                pneumophila Serogroup 1.             2. Does not detect other serogroups of L. pneumophila                or other Legionella species.             Test Reference Range: Negative      Rapid influenza A/B antigens [[id number]] Collected:  01/02/19 0104    Specimen:  Nasopharyngeal from Nasal Aspirate Updated:  01/02/19 0138    Narrative:       ORDER#: [id number]                                    ORDERED BY: Gloris Manchester  SOURCE: Nasal Aspirate                               COLLECTED:  01/02/19 01:04  ANTIBIOTICS AT COLL.:  RECEIVED :  01/02/19 01:19  Influenza Rapid Antigen A&B                FINAL       01/02/19 01:38  01/02/19   Negative for Influenza A and B             Reference Range: Negative      Respiratory Pathogen Panel, PCR (Film Array) [[id number]] Collected:  01/02/19 0104     Updated:  01/02/19 0644     Adenovirus Not Detected     Coronavirus 229E Not Detected     Coronavirus HKU1 Not Detected     Coronavirus NL63 Not Detected     Coronavirus OC43 Not Detected     Human Metapneumovirus Not Detected     Human Rhinovirus/Enterovirus Not Detected     Influenza A Not Detected     Influenza A/H1 Not Detected     Influenza AH1 - 2009 Not Detected     Influenza A/H3 Not Detected     Influenza B Not Detected     Parainfluenza Virus 1 Not Detected     Parainfluenza Virus 2 Not Detected     Parainfluenza Virus 3 Not Detected     Parainfluenza Virus 4 Not Detected     Respiratory Syncytial Virus Not Detected     Bordetella pertussis Not Detected     Chlamydophila pneumoniae Not Detected     Mycoplasma pneumoniae Not Detected     Comment: Multiplex nucleic acid amplification assay for detection of  18 respiratory viruses and bacteria. This assay cannot  differentiate Rhinovirus/Enterovirus. If necessary for  patient care, a positive result for Rhinovirus/Enterovirus  may be followed-up using an alternate method.  Viral and bacterial nucleic acids may persist  even though no  viable organism is present. Detection of nucleic acid does  not imply that the corresponding organisms are infectious or  are the causative agents of clinical symptoms. A negative  result does not exclude the possibility of viral or  bacterial infection. Performance characteristics may vary  with circulating strains. The assay may not be able to  distinguish between existing viral strains and new variants  as they emerge.The performance of this test has not been  established in individuals who received influenza vaccine.  Recent administration of a nasal influenza vaccine may cause  false positive results for Influenza A and/or Influenza B.  This assay is FDA cleared for nasopharyngeal swab samples.  Performance characteristics for Bronchoalveolar lavage  samples have been determined by the [facility] laboratory. Other  sample types are unacceptable.          Source NP Swab             Chest 2 Views    Result Date: 01/01/2019   Patchy bilateral perihilar opacities are nonspecific. Atypical infection cannot be excluded. Clinical correlation and continued follow-up is recommended. [name], MD 01/01/2019 10:01 PM    Ct Angiogram Chest    Result Date: 01/02/2019   1. This is a limited study due to multiple factors including patient motion and suboptimal bolus timing. No large central filling defect within the pulmonary arteries. 2. There is mosaic attenuation and air trapping worsened mid to upper lobe distribution. While this is a nonspecific finding but can be associated with many disease entities and etiologies, it can be seen with occlusive vascular disease such as chronic thromboembolic disease. Jeanella Cara, MD 01/02/2019 3:19 PM    Nm Pulmonary Ventilation And Perfusion (  aerosol Or Gas)    Result Date: 01/08/2019   VERY LOW PROBABILITY FOR PULMONARY EMBOLISM. Leonard Downing, MD 01/08/2019 3:48 PM    Xr Chest Ap Portable    Result Date: 01/07/2019   Stable cardiomegaly. Stable bilateral interstitial  prominence. Trudie Reed, MD 01/07/2019 9:50 AM    Xr Chest Ap Portable    Result Date: 01/06/2019   Stable diffuse lung disease Elyn Peers, MD 01/06/2019 12:10 PM    Xr Chest Ap Portable    Result Date: 01/03/2019   Pulmonary edema pattern without change which may be seen with CHF or multifocal pneumonia Elyn Peers, MD 01/03/2019 9:46 AM    US Venous Low Extrem Duplx Dopp Comp Bilat    Result Date: 01/03/2019   No ultrasound evidence of DVT in either lower extremity. Graciella Freer, MD 01/03/2019 5:14 PM    Echo  Summary    * Left ventricular ejection fraction is normal with an estimated ejection  fraction of 60-65%.    * The left ventricle is mildly dilated.    * Left ventricular wall thickness is normal.    * Left ventricular diastolic filling parameters demonstrate normal diastolic  function.    * The right ventricular cavity size is dilated.    * Normal right ventricular systolic function.    * Moderate pulmonary hypertension with estimated right ventricular systolic  pressure of  54 mmHg.    * The left atrium is mildly dilated.    * There is no aortic stenosis.    * There is no aortic regurgitation.    * There is mild mitral regurgitation.    * The aortic root is mildly dilated.    * The IVC is mildly dilated to  2.26 cm.    * There is a small pericardial effusion.       ASSESSMENT/PLAN   Patient Active Hospital Problem List:   Multilobar pneumonia    Acute COPD exacerbation   OSA on CPAP qhs    History of CAD    Anxiety            [name] is a 48 y.o. female with a history of COPD, active tobacco use, CAD/MI, and anxiety presenting with shortness of breath. she was hospitalized for pneumonia 3 months ago in [place].   She is admitted for pneumonia and acute COPD exacerbation. Elevated d-dimer, CTA chest negative for PE but concerning for extensive multilobar pneumonia. venous lower extremity doppler negative for DVT. Lactic acid and procalcitonin normal.  Legionella, influenza, respiratory pathogen  panel negative.  Echo shows LVEF 60-65%, moderate pulmonary hypertension. Pulmonology and ID consulted.  Status post cefepime and doxycyline.  Currently on doxycycline only    Status post VQ scan which showed low probability for pulmonary embolism.      #Acute hypoxic respiratory failure   #Multilobar Pneumonia   #Acute COPD exacerbation  -CTA chest negative for PE but concerning for extensive multilobar pneumonia.  -ID following, appreciate recs.  Continue with 4 more days of doxycycline  -Pulmonology following, appreciate recs  -Patient's oral prednisone on taper course  -sputum cx showing moderate growth of mixed upper respiratory flora, viral respiratory panel negative, influenza negative, Legionella negative  -Status post VQ scan which showed low probability for pulmonary embolism.  -continue supplemental oxygen, currently weaned down to 1 L. BIPAP qhs   -continue breo, scheduled duonebs   -mucinex, incentive spirometry  -monitor on telemetry    #CAD  -  EKG shows NSR, nonspecific T wave changes. Serial troponins x 3 negative.   -continue aspirin  -continue to monitor on telemetry    #Tobacco use  -advised cessation. Nicotine patch.     #OSA   -on cpap qhs      #Anxiety  -continue Resporal Zoloft and Neurontin    #Overactive bladder  -continue oxybutynin      Code: FULL   DVT prophylaxis: lovenox  Disposition: continues to require inpatient hospitalization.           Signed,  Marya Landry           This chart was generated using hospital voice-recognition software which does not employ spell-checking or grammar-checking features. It was dictated, all or in part, in a busy and often noisy patient care environment. I have taken all usual measures to dictate carefully and to review all aspects this chart. Nonetheless, given the known and well-documented performance characteristics of VR software in such patient care environments, this dictation still may contain unrecognized and wholly unintended errors or  omissions

## 2019-01-09 NOTE — UM Notes (Signed)
[facility] Utilization Review  NPI: [id number], Tax ID: [id number]  Please Call Krystal Eaton, MSN, RN @ [phone] with any questions or concerns. Confidential voicemail.  Email: [name]   Fax final authorizations and requests for additional information to [phone]    01/09/19 Continued Stay Review     SUBJECTIVE     Patient seen and examined at bedside with RN. Currently on 1 L NC.    Updated on plan of care.     Temp:  [96.5 F (35.8 C)-98.9 F (37.2 C)] 97.1 F (36.2 C)  Heart Rate:  [63-81] 81  Resp Rate:  [17-19] 18  BP: (94-118)/(52-75) 118/75   SpO2 95% via O2 @ 1L NC    Lab Results last 48 Hours     Procedure Component Value Units Date/Time    Glucose Whole Blood - POCT [[id number]]  (Abnormal) Collected:  01/09/19 1248     Updated:  01/09/19 1257     POCT - Glucose Whole blood 110 mg/dL     Glucose Whole Blood - POCT [[id number]] Collected:  01/09/19 0712     Updated:  01/09/19 0745     POCT - Glucose Whole blood 83 mg/dL     Hemolysis index [[id number]] Collected:  01/09/19 0319     Updated:  01/09/19 0511     Hemolysis Index 5    GFR [[id number]] Collected:  01/09/19 0319     Updated:  01/09/19 0511     EGFR >75.7    Basic Metabolic Panel [[id number]]  (Abnormal) Collected:  01/09/19 0319    Specimen:  Blood Updated:  01/09/19 0511     Glucose 108 mg/dL     CBC without differential [[id number]]  (Abnormal) Collected:  01/09/19 0319    Specimen:  Blood Updated:  01/09/19 0506     WBC 13.97 x10 3/uL      Hgb 11.8 g/dL      Hematocrit 37.2 %      Platelets 363 x10 3/uL         Scheduled Meds:  Current Facility-Administered Medications  Medication Dose Route Frequency   albuterol-ipratropium  3 mL Nebulization Q4H SCH   aspirin  81 mg Oral Daily   doxycycline  100 mg Oral Q12H SCH   enoxaparin  0.5 mg/kg Subcutaneous Daily   fluticasone furoate-vilanterol  1 puff Inhalation QAM   gabapentin  800 mg Oral TID   guaiFENesin  600 mg Oral Q12H SCH   insulin lispro  1-3 Units  Subcutaneous QHS   insulin lispro  1-5 Units Subcutaneous TID AC   lactobacillus/streptococcus  1 capsule Oral Daily   nicotine  1 patch Transdermal Daily   oxybutynin  5 mg Oral TID   pantoprazole  40 mg Oral QAM AC   [START ON 01/10/2019] predniSONE  40 mg Oral QAM W/BREAKFAST   Followed by   Derrill Memo ON 01/11/2019] predniSONE  30 mg Oral QAM W/BREAKFAST   Followed by   Derrill Memo ON 01/12/2019] predniSONE  20 mg Oral QAM W/BREAKFAST   Followed by   Derrill Memo ON 01/13/2019] predniSONE  10 mg Oral QAM W/BREAKFAST   risperiDONE  3 mg Oral BID   sertraline  100 mg Oral BID    PRN Meds:.acetaminophen **OR** acetaminophen, ALPRAZolam, alum & mag hydroxide-simethicone, benzonatate, Nursing communication: Adult Hypoglycemia Treatment Algorithm **AND** dextrose **AND** dextrose **AND** glucagon (rDNA), diphenhydrAMINE, guaiFENesin-dextromethorphan, influenza, naloxone, nitroglycerin, ondansetron **OR** ondansetron, oxyCODONE-acetaminophen, senna-docusate    Per ID Note:  Assessment:      Multifocal pneumonia  COPD exacerbation   Sputum culture no growth to date   Chest x-ray-stable cardiomegaly/bilateral interstitial prominence (01/07/2019)   Procalcitonin level-0.02 (01/02/2019)   Procalcitonin level-0.02 (01/08/2019)   History of heavy smoking   Leukocytosis-on steroids   Morbid obesity   Clinically improved    Plan:      Continue doxycycline x4 more days   Discharge planning once cleared by pulmonary   Follow-up chest x-ray   Continue probiotics   Monitor electrolytes and renal functions closely   Monitor clinically    Discussed with patient in detail      Per Pulmonology Note:  Assessment:     Acute hypoxic respiratory failure on oxygen support  Multilobar pneumonia  Pulmonary embolism ruled out patient  COPD exacerbation  Morbid obesity  Obstructive sleep apnea    Plan:       Broad-spectrum antibiotic  Oral corticosteroid  Supplemental oxygen  Patient had a VQ scan which was normal which  ruled out evidence of pulmonary embolism  BiPAP management  Bronchodilators  Inhaled corticosteroid    Per Medicine Note:  ASSESSMENT/PLAN   Patient Active Hospital Problem List:   Multilobar pneumonia    Acute COPD exacerbation   OSA on CPAP qhs    History of CAD    Anxiety      Madeline L Buckleris a 48 y.o.femalewith a history of COPD, active tobacco use, CAD/MI, and anxietypresenting with shortness of breath. she was hospitalized for pneumonia 3 months ago in [place].   She is admitted for pneumonia and acute COPD exacerbation. Elevated d-dimer, CTA chest negative for PE but concerning for extensive multilobar pneumonia. venous lower extremity doppler negative for DVT. Lactic acid and procalcitonin normal.  Legionella, influenza, respiratory pathogen panel negative.  Echo shows LVEF 60-65%, moderate pulmonary hypertension. Pulmonology and ID consulted.  Status post cefepime and doxycyline.  Currently on doxycycline only    Status post VQ scan which showed low probability for pulmonary embolism.      #Acute hypoxic respiratory failure   #Multilobar Pneumonia   #Acute COPD exacerbation  -CTA chest negative for PE but concerning for extensive multilobar pneumonia.  -ID following, appreciate recs.  Continue with 4 more days of doxycycline  -Pulmonology following, appreciate recs  -Patient's oral prednisone on taper course  -sputum cx showing moderate growth of mixed upper respiratory flora, viral respiratory panel negative, influenza negative, Legionella negative  -Status post VQ scan which showed low probability for pulmonary embolism.  -continue supplemental oxygen, currently weaned down to 1 L. BIPAP qhs   -continue breo, scheduled duonebs   -mucinex, incentive spirometry  -monitor on telemetry    #CAD  -EKG shows NSR, nonspecific T wave changes. Serial troponins x 3 negative.   -continue aspirin  -continue to monitor on telemetry    #Tobacco use  -advised cessation. Nicotine patch.     #OSA   -on cpap qhs       #Anxiety  -continue Resporal Zoloft and Neurontin    #Overactive bladder  -continue oxybutynin      Krystal Eaton, RN, MSN  Utilization Review Case Management  [phone] (F)

## 2019-01-09 NOTE — Plan of Care (Signed)
Problem: Pain  Goal: Pain at adequate level as identified by patient  Outcome: Progressing  Flowsheets (Taken 01/09/2019 2042)  Pain at adequate level as identified by patient: Identify patient comfort function goal; Reassess pain within 30-60 minutes of any procedure/intervention, per Pain Assessment, Intervention, Reassessment (AIR) Cycle; Evaluate if patient comfort function goal is met; Evaluate patient's satisfaction with pain management progress     Problem: Moderate/High Fall Risk Score >5  Goal: Patient will remain free of falls  Outcome: Progressing  Flowsheets (Taken 01/09/2019 0800)  Moderate Risk (6-13): MOD-Consider activation of bed alarm if appropriate;MOD-Apply bed exit alarm if patient is confused;MOD-Floor mat at bedside (where available) if appropriate;MOD-Remain with patient during toileting;MOD-Place bedside commode and assistive devices out of sight when not in use     Problem: Compromised Hemodynamic Status  Goal: Vital signs and fluid balance maintained/improved  Outcome: Progressing  Flowsheets (Taken 01/09/2019 2042)  Vital signs and fluid balance are maintained/improved: Position patient for maximum circulation/cardiac output; Monitor/assess vitals and hemodynamic parameters with position changes; Monitor/assess lab values and report abnormal values; Monitor intake and output. Notify LIP if urine output is less than 30 mL/hour.     Problem: Inadequate Gas Exchange  Goal: Adequate oxygenation and improved ventilation  Outcome: Progressing  Flowsheets (Taken 01/09/2019 2042)  Adequate oxygenation and improved ventilation: Assess lung sounds; Teach/reinforce use of incentive spirometer 10 times per hour while awake, cough and deep breath as needed; Increase activity as tolerated/progressive mobility; Plan activities to conserve energy: plan rest periods     Problem: Pain interferes with ability to perform ADL  Goal: Pain at adequate level as identified by patient  Outcome:  Progressing  Flowsheets (Taken 01/09/2019 2042)  Pain at adequate level as identified by patient: Identify patient comfort function goal; Reassess pain within 30-60 minutes of any procedure/intervention, per Pain Assessment, Intervention, Reassessment (AIR) Cycle; Evaluate if patient comfort function goal is met; Evaluate patient's satisfaction with pain management progress     Problem: Compromised Tissue integrity  Goal: Nutritional status is improving  Outcome: Progressing  Flowsheets (Taken 01/09/2019 2042)  Nutritional status is improving: Assist patient with eating; Allow adequate time for meals; Encourage patient to take dietary supplement(s) as ordered      Pt AOx4, anxious at times, ambulatory with unsteady gait x1 assist to the BSC/chair, bed alarm in place, pt compliant with the call bell, able to make needs known. Pt c/o back pain 5-7/10, pt received PRN PO percocet to good relief. Pt c/o anxiety, pt received PRN PO xanax to good relief. Pt denies having any n/v this shift. Tele in place running SR. Pt on 1LPM NC pulse ox 95%, c/o SOB with exertion. Pt tolerating diet and PO medications well, refusing BGM, [place] aware. Pt using the BSC as needed. Hourly rounds done. Call bell, telephone, and personal items within reach. Will continue to monitor.

## 2019-01-10 LAB — CBC
Absolute NRBC: 0 10*3/uL (ref 0.00–0.00)
Hematocrit: 40.4 % (ref 34.7–43.7)
Hgb: 12.6 g/dL (ref 11.4–14.8)
MCH: 28.8 pg (ref 25.1–33.5)
MCHC: 31.2 g/dL — ABNORMAL LOW (ref 31.5–35.8)
MCV: 92.4 fL (ref 78.0–96.0)
MPV: 9.6 fL (ref 8.9–12.5)
Nucleated RBC: 0 /100 WBC (ref 0.0–0.0)
Platelets: 342 10*3/uL (ref 142–346)
RBC: 4.37 10*6/uL (ref 3.90–5.10)
RDW: 14 % (ref 11–15)
WBC: 16.56 10*3/uL — ABNORMAL HIGH (ref 3.10–9.50)

## 2019-01-10 LAB — BASIC METABOLIC PANEL
Anion Gap: 10 (ref 5.0–15.0)
BUN: 18 mg/dL (ref 7.0–19.0)
CO2: 23 mEq/L (ref 22–29)
Calcium: 9.7 mg/dL (ref 8.5–10.5)
Chloride: 107 mEq/L (ref 100–111)
Creatinine: 0.7 mg/dL (ref 0.6–1.0)
Glucose: 97 mg/dL (ref 70–100)
Potassium: 4.2 mEq/L (ref 3.5–5.1)
Sodium: 140 mEq/L (ref 136–145)

## 2019-01-10 LAB — HEMOLYSIS INDEX: Hemolysis Index: 32 — ABNORMAL HIGH (ref 0–18)

## 2019-01-10 LAB — GLUCOSE WHOLE BLOOD - POCT
Whole Blood Glucose POCT: 82 mg/dL (ref 70–100)
Whole Blood Glucose POCT: 138 mg/dL — ABNORMAL HIGH (ref 70–100)

## 2019-01-10 LAB — GFR: EGFR: >60

## 2019-01-10 NOTE — PT Progress Note (Signed)
Physical Therapy Note    [facility]  Physical Therapy Treatment    Patient:  [name]  MRN#:  [id number]  Unit:  [place]  Room/Bed:  A2515/A2515-01    Time of treatment:  Time Calculation  PT Received On: 01/10/19  Start Time: 1411  Stop Time: 1434  Time Calculation (min): 23 min            Chart Review and Collaboration with Care Team: 5 minutes, not included in above time.    PT Visit Number: 2    Precautions:   Precautions  Weight Bearing Status: no restrictions  Other Precautions: decr. activity tolerance    Updated X-Rays/Tests/Labs:  Lab Results   Component Value Date/Time    HGB 12.6 01/10/2019 09:33 AM    HCT 40.4 01/10/2019 09:33 AM    K 4.2 01/10/2019 09:33 AM    NA 140 01/10/2019 09:33 AM    TROPI <0.01 01/02/2019 08:39 AM    TROPI <0.01 01/02/2019 05:06 AM    TROPI <0.01 01/01/2019 09:16 PM       All imaging reviewed, please see chart for details.      Subjective:  Pt reports trf to commode, no amb significant distance.    Patient's medical condition is appropriate for Physical Therapy intervention at this time.  Patient is agreeable to participation in the therapy session.      Objective:  Observation of Patient/Vital Signs:  Desats w/ amb on 1 L O2 NC to 88% after 10', increased to 2L sats 88-89% w/ amb.  86% after full distance.     HR 110's w/ gait.     Patient received in bed with O2 at 2 liters/minute via NC in place.    Cognition/Neuro Status  Arousal/Alertness: Appropriate responses to stimuli  Attention Span: Appears intact  Behavior: attentive;calm;cooperative    Functional Mobility  Supine to Sit: Modified Independent;HOB raised;using bedrail  Sit to Supine: Independent  Sit to Stand: Supervision  Stand to Sit: Supervision     Locomotion  Ambulation: Supervision;with front-wheeled walker  Pattern: R foot decreased clearance;L foot decreased clearance;decreased cadence;decreased step length(Frequent standing rest breaks)  Distance Walked (ft) (Step 6,7):  250 Feet(significantly increased time required to complete distance.)      Educated the patient to role of physical therapy, plan of care, goals of therapy and safety with mobility and ADLs.    Patient left in bed with oxygen (1 L) in place and call bell and all personal items/needs within reach. RN notified of session outcome.       Assessment:  Pt continues to be primarily limited by endurance.  Will benefit from frequent short distance amb to address decreased endurance.  Pt encouraged to amb frequently w/ staff.  Pt w/ increased O2 needs with activity.  Will benefit from continued PT to address gait w/ LRAD and safe stair negotiation.     PMP Activity: Step 7 - Walks out of Room  Distance Walked (ft) (Step 6,7): 250 Feet(significantly increased time required to complete distance.)      Plan:  Treatment/Interventions: Gait training, Stair training      PT Frequency: 1-2x/wk   Continue plan of care.    Goals:  Goals  Goal Formulation: With patient  Time for Goal Acheivement: By time of discharge  Goals: Select goal  Pt Will Go Supine To Sit: independent, Partly met  Pt Will Perform Sit to Stand: independent, Partly met  Pt Will Transfer Bed/Chair:  with rolling walker, modified independent, Partly met  Pt Will Ambulate: 101-150 feet, with rolling walker, modified independent, Partly met  Pt Will Go Up / Down Stairs: 1 flight, modified independent, With rail, Partly met        DME Recommended for Discharge: ([facility] at home)  Discharge Recommendation: Home with supervision, Home with home health PT, Other(Comment)(PTS transport for assistance w/ stairs to enter home (3 FOS))    Bobetta Lime PT DPT  [phone]  01/10/2019 2:56 PM

## 2019-01-10 NOTE — Progress Notes (Signed)
[facility]  PROGRESS NOTE      Patient: [name]  Date: 01/10/2019   LOS: 8 Days  Admission Date: 01/01/2019   MRN: [id number]  Attending: Marya Landry  Please contact me on [employer]            SUBJECTIVE     Patient seen and examined at hallway with RN as well as PT.  Patient unfortunately had worsening shortness of breath with ambulation requiring 2 L of nasal cannula.   Reports feeling much better otherwise. Updated on plan of care.   Denies chest pain, abdominal pain, nausea, vomiting, diarrhea.        OBJECTIVE     Vitals:    01/10/19 1200   BP: 114/56   Pulse: 73   Resp:    Temp: 97.8 F (36.6 C)   SpO2: 95%       Temperature: Temp  Min: 97.1 F (36.2 C)  Max: 98.3 F (36.8 C)  Pulse: Pulse  Min: 66  Max: 84  Respiratory: Resp  Min: 18  Max: 20  Non-Invasive BP: BP  Min: 106/68  Max: 118/75  Pulse Oximetry SpO2  Min: 95 %  Max: 96 %    Intake and Output Summary (Last 24 hours) at Date Time    Intake/Output Summary (Last 24 hours) at 01/10/2019 1514  Last data filed at 01/10/2019 0900  Gross per 24 hour   Intake 444 ml   Output 1500 ml   Net -1056 ml       PHYSICAL EXAMINATION  GEN APPEARANCE:in NAD.  Morbidly obese female alert and cooperative.   HEENT: NCAT, EOMI, PERRL, no nasal discharge.  Nasal cannula noted  conjunctivae/corneas clear. Oral mucosa moist.   Neck: Supple without meningismus   CVS: RRR, S1, S2. No murmur   LUNGS: Improving breath sounds with no wheezes, symmetric air entry. On O2.   ABD: BS+, soft, obese, NT, ND, no guarding or rigidity  EXT: No edema; Pulses 2+ and intact  NEURO: AAOx3, CN 2-12 intact.  No focal neurological deficits  MENTAL STATUS: Normal.  Appropriate affect and mood      MEDICATIONS     Current Facility-Administered Medications   Medication Dose Route Frequency    albuterol-ipratropium  3 mL Nebulization Q4H SCH    aspirin  81 mg Oral Daily    doxycycline  100 mg Oral Q12H [place]    enoxaparin  0.5 mg/kg Subcutaneous Daily    fluticasone  furoate-vilanterol  1 puff Inhalation QAM    gabapentin  800 mg Oral TID    guaiFENesin  600 mg Oral Q12H [place]    insulin lispro  1-3 Units Subcutaneous QHS    insulin lispro  1-5 Units Subcutaneous TID AC    lactobacillus/streptococcus  1 capsule Oral Daily    nicotine  1 patch Transdermal Daily    oxybutynin  5 mg Oral TID    pantoprazole  40 mg Oral QAM AC    [START ON 01/11/2019] predniSONE  30 mg Oral QAM W/BREAKFAST    Followed by    Derrill Memo ON 01/12/2019] predniSONE  20 mg Oral QAM W/BREAKFAST    Followed by    Derrill Memo ON 01/13/2019] predniSONE  10 mg Oral QAM W/BREAKFAST    risperiDONE  3 mg Oral BID    sertraline  100 mg Oral BID            LABS/IMAGING     Recent Labs   Lab 01/10/19  0042 01/09/19  0319 01/08/19  0411   WBC 16.56* 13.97* 11.94*   RBC 4.37 4.05 4.22   Hgb 12.6 11.8 12.0   Hematocrit 40.4 37.2 38.5   MCV 92.4 91.9 91.2   Platelets 342 363* 364*       Recent Labs   Lab 01/10/19  0933 01/09/19  0319 01/08/19  0411 01/07/19  0336 01/06/19  0326   Sodium 140 138 137 137 140   Potassium 4.2 4.4 4.8 5.1 4.3   Chloride 107 105 105 106 109   CO2 _0 BUN 18.0 17.0 22.0* 18.0 17.0   Creatinine 0.7 0.7 0.7 0.7 0.7   Glucose 97 108* 106* 113* 88   Calcium 9.7 9.2 10.0 9.8 9.9                         Microbiology Results     Procedure Component Value Units Date/Time    Legionella antigen, urine [[id number]] Collected:  01/02/19 0120    Specimen:  Urine, Clean Catch Updated:  01/02/19 1146    Narrative:       ORDER#: [id number]                                    ORDERED BY: Gloris Manchester  SOURCE: Urine, Clean Catch                           COLLECTED:  01/02/19 01:20  ANTIBIOTICS AT COLL.:                                RECEIVED :  01/02/19 04:11  Legionella, Rapid Urinary Antigen          FINAL       01/02/19 11:46  01/02/19   Negative for Legionella pneumophila Serogroup 1 Antigen             Limitations of Test:             1. Negative results do not exclude infection with  Legionella                pneumophila Serogroup 1.             2. Does not detect other serogroups of L. pneumophila                or other Legionella species.             Test Reference Range: Negative      Rapid influenza A/B antigens [[id number]] Collected:  01/02/19 0104    Specimen:  Nasopharyngeal from Nasal Aspirate Updated:  01/02/19 0138    Narrative:       ORDER#: [id number]                                    ORDERED BY: Gloris Manchester  SOURCE: Nasal Aspirate                               COLLECTED:  01/02/19 01:04  ANTIBIOTICS AT COLL.:  RECEIVED :  01/02/19 01:19  Influenza Rapid Antigen A&B                FINAL       01/02/19 01:38  01/02/19   Negative for Influenza A and B             Reference Range: Negative      Respiratory Pathogen Panel, PCR (Film Array) [[id number]] Collected:  01/02/19 0104     Updated:  01/02/19 0644     Adenovirus Not Detected     Coronavirus 229E Not Detected     Coronavirus HKU1 Not Detected     Coronavirus NL63 Not Detected     Coronavirus OC43 Not Detected     Human Metapneumovirus Not Detected     Human Rhinovirus/Enterovirus Not Detected     Influenza A Not Detected     Influenza A/H1 Not Detected     Influenza AH1 - 2009 Not Detected     Influenza A/H3 Not Detected     Influenza B Not Detected     Parainfluenza Virus 1 Not Detected     Parainfluenza Virus 2 Not Detected     Parainfluenza Virus 3 Not Detected     Parainfluenza Virus 4 Not Detected     Respiratory Syncytial Virus Not Detected     Bordetella pertussis Not Detected     Chlamydophila pneumoniae Not Detected     Mycoplasma pneumoniae Not Detected     Comment: Multiplex nucleic acid amplification assay for detection of  18 respiratory viruses and bacteria. This assay cannot  differentiate Rhinovirus/Enterovirus. If necessary for  patient care, a positive result for Rhinovirus/Enterovirus  may be followed-up using an alternate method.  Viral and bacterial nucleic acids may persist  even though no  viable organism is present. Detection of nucleic acid does  not imply that the corresponding organisms are infectious or  are the causative agents of clinical symptoms. A negative  result does not exclude the possibility of viral or  bacterial infection. Performance characteristics may vary  with circulating strains. The assay may not be able to  distinguish between existing viral strains and new variants  as they emerge.The performance of this test has not been  established in individuals who received influenza vaccine.  Recent administration of a nasal influenza vaccine may cause  false positive results for Influenza A and/or Influenza B.  This assay is FDA cleared for nasopharyngeal swab samples.  Performance characteristics for Bronchoalveolar lavage  samples have been determined by the [facility] laboratory. Other  sample types are unacceptable.          Source NP Swab             Chest 2 Views    Result Date: 01/01/2019   Patchy bilateral perihilar opacities are nonspecific. Atypical infection cannot be excluded. Clinical correlation and continued follow-up is recommended. [name], MD 01/01/2019 10:01 PM    Ct Angiogram Chest    Result Date: 01/02/2019   1. This is a limited study due to multiple factors including patient motion and suboptimal bolus timing. No large central filling defect within the pulmonary arteries. 2. There is mosaic attenuation and air trapping worsened mid to upper lobe distribution. While this is a nonspecific finding but can be associated with many disease entities and etiologies, it can be seen with occlusive vascular disease such as chronic thromboembolic disease. Jeanella Cara, MD 01/02/2019 3:19 PM    Nm Pulmonary Ventilation And Perfusion (  aerosol Or Gas)    Result Date: 01/08/2019   VERY LOW PROBABILITY FOR PULMONARY EMBOLISM. Leonard Downing, MD 01/08/2019 3:48 PM    Xr Chest Ap Portable    Result Date: 01/07/2019   Stable cardiomegaly. Stable bilateral interstitial  prominence. Trudie Reed, MD 01/07/2019 9:50 AM    Xr Chest Ap Portable    Result Date: 01/06/2019   Stable diffuse lung disease Elyn Peers, MD 01/06/2019 12:10 PM    Xr Chest Ap Portable    Result Date: 01/03/2019   Pulmonary edema pattern without change which may be seen with CHF or multifocal pneumonia Elyn Peers, MD 01/03/2019 9:46 AM    US Venous Low Extrem Duplx Dopp Comp Bilat    Result Date: 01/03/2019   No ultrasound evidence of DVT in either lower extremity. Graciella Freer, MD 01/03/2019 5:14 PM    Echo  Summary    * Left ventricular ejection fraction is normal with an estimated ejection  fraction of 60-65%.    * The left ventricle is mildly dilated.    * Left ventricular wall thickness is normal.    * Left ventricular diastolic filling parameters demonstrate normal diastolic  function.    * The right ventricular cavity size is dilated.    * Normal right ventricular systolic function.    * Moderate pulmonary hypertension with estimated right ventricular systolic  pressure of  54 mmHg.    * The left atrium is mildly dilated.    * There is no aortic stenosis.    * There is no aortic regurgitation.    * There is mild mitral regurgitation.    * The aortic root is mildly dilated.    * The IVC is mildly dilated to  2.26 cm.    * There is a small pericardial effusion.       ASSESSMENT/PLAN   Patient Active Hospital Problem List:   Multilobar pneumonia    Acute COPD exacerbation   OSA on CPAP qhs    History of CAD    Anxiety            [name] is a 48 y.o. female with a history of COPD, active tobacco use, CAD/MI, and anxiety presenting with shortness of breath. she was hospitalized for pneumonia 3 months ago in [place].   She is admitted for pneumonia and acute COPD exacerbation. Elevated d-dimer, CTA chest negative for PE but concerning for extensive multilobar pneumonia. venous lower extremity doppler negative for DVT. Lactic acid and procalcitonin normal.  Legionella, influenza, respiratory pathogen  panel negative.  Echo shows LVEF 60-65%, moderate pulmonary hypertension. Pulmonology and ID consulted.  Status post cefepime and doxycyline.  Currently on doxycycline only    Status post VQ scan which showed low probability for pulmonary embolism.      #Acute hypoxic respiratory failure   #Multilobar Pneumonia   #Acute COPD exacerbation  -CTA chest negative for PE but concerning for extensive multilobar pneumonia.  -ID following, appreciate recs.  Continue with 3 more days of doxycycline  -Pulmonology following, appreciate recs  -Patient's oral prednisone on taper course  -sputum cx showing moderate growth of mixed upper respiratory flora, viral respiratory panel negative, influenza negative, Legionella negative  -Status post VQ scan which showed low probability for pulmonary embolism.  -continue supplemental oxygen, currently went up to 2 L. BIPAP qhs   -continue breo, scheduled duonebs   -mucinex, incentive spirometry  -monitor on telemetry    #CAD  -  EKG shows NSR, nonspecific T wave changes. Serial troponins x 3 negative.   -continue aspirin  -continue to monitor on telemetry    #Tobacco use  -advised cessation. Nicotine patch.     #OSA   -on cpap qhs      #Anxiety  -continue Resporal Zoloft and Neurontin    #Overactive bladder  -continue oxybutynin      Code: FULL   DVT prophylaxis: lovenox  Disposition: continues to require inpatient hospitalization.           Signed,  Marya Landry           This chart was generated using hospital voice-recognition software which does not employ spell-checking or grammar-checking features. It was dictated, all or in part, in a busy and often noisy patient care environment. I have taken all usual measures to dictate carefully and to review all aspects this chart. Nonetheless, given the known and well-documented performance characteristics of VR software in such patient care environments, this dictation still may contain unrecognized and wholly unintended errors or  omissions

## 2019-01-10 NOTE — Progress Notes (Signed)
Nutritional Support Services  Nutrition Note    [name] 48 y.o. female   MRN: [id number]      Referral Source: Census Review  Reason for Referral: LOS - 8     Diet: Regular    Education Provided: no education needed    Clinical Nutrition Summary: 48 y.o. female with a history of COPD, CAD/MI, and anxiety presenting with shortness of breath.     Patient tolerating diet per RN, noted 100% intake of all documented meals. Patient with significant weight gain during admission, pt states it is d/t to food consumption but more likely d/t fluid as patien twith generalized BUE/BLE nonpitting edema. Patient denies N/V/D/C, chewing/swallowing difficulties. Observed no signs of muscle/fat loss.       No nutrition diagnosis at this time.      Patient is at low nutritional risk; will be available within 10 days and PRN.      Parke Simmers, RD, [place]  Clinical Dietitian  [phone]

## 2019-01-10 NOTE — UM Notes (Signed)
[facility] Utilization Review  NPI: [id number], Tax ID: [id number]  Please Call Krystal Eaton, MSN, RN @ [phone] with any questions or concerns. Confidential voicemail.  Email: [name]   Fax final authorizations and requests for additional information to [phone]      01/10/19 Continued Stay Review     SUBJECTIVE     Patient seen and examined at hallway with RN as well as PT.  Patient unfortunately had worsening shortness of breath with ambulation requiring 2 L of nasal cannula.    Updated on plan of care.     Temp:  [97.1 F (36.2 C)-98.3 F (36.8 C)] 98.1 F (36.7 C)  Heart Rate:  [66-84] 73  Resp Rate:  [18-20] 18  BP: (102-118)/(55-75) 102/58   SpO2% 95% via O2@ 2L NC    Lab Results last 48 Hours     Procedure Component Value Units Date/Time    Glucose Whole Blood - POCT [[id number]]  (Abnormal) Collected:  01/10/19 1201     Updated:  01/10/19 1258     POCT - Glucose Whole blood 138 mg/dL     Hemolysis index [[id number]]  (Abnormal) Collected:  01/10/19 0933     Updated:  01/10/19 1009     Hemolysis Index 32    GFR [[id number]] Collected:  01/10/19 0933     Updated:  01/10/19 1009     EGFR >60.0    CBC without differential [[id number]]  (Abnormal) Collected:  01/10/19 0933    Specimen:  Blood Updated:  01/10/19 0957     WBC 16.56 x10 3/uL      MCHC 31.2 g/dL     Glucose Whole Blood - POCT [[id number]] Collected:  01/10/19 0746     Updated:  01/10/19 0825     POCT - Glucose Whole blood 82 mg/dL         Intake/Output Summary (Last 24 hours) at 01/10/2019 1514  Last data filed at 01/10/2019 0900  Gross per 24 hour   Intake 444 ml   Output 1500 ml   Net -1056 ml     Scheduled Meds:  Current Facility-Administered Medications  Medication Dose Route Frequency   albuterol-ipratropium  3 mL Nebulization Q4H SCH   aspirin  81 mg Oral Daily   doxycycline  100 mg Oral Q12H SCH   enoxaparin  0.5 mg/kg Subcutaneous Daily   fluticasone furoate-vilanterol  1 puff Inhalation QAM   gabapentin   800 mg Oral TID   guaiFENesin  600 mg Oral Q12H [place]   insulin lispro  1-3 Units Subcutaneous QHS   insulin lispro  1-5 Units Subcutaneous TID AC   lactobacillus/streptococcus  1 capsule Oral Daily   nicotine  1 patch Transdermal Daily   oxybutynin  5 mg Oral TID   pantoprazole  40 mg Oral QAM AC   [START ON 01/11/2019] predniSONE  30 mg Oral QAM W/BREAKFAST   Followed by   Derrill Memo ON 01/12/2019] predniSONE  20 mg Oral QAM W/BREAKFAST   Followed by   Derrill Memo ON 01/13/2019] predniSONE  10 mg Oral QAM W/BREAKFAST   risperiDONE  3 mg Oral BID   sertraline  100 mg Oral BID    PRN Meds:.acetaminophen **OR** acetaminophen, ALPRAZolam, alum & mag hydroxide-simethicone, benzonatate, Nursing communication: Adult Hypoglycemia Treatment Algorithm **AND** dextrose **AND** dextrose **AND** glucagon (rDNA), diphenhydrAMINE, guaiFENesin-dextromethorphan, influenza, naloxone, nitroglycerin, ondansetron **OR** ondansetron, oxyCODONE-acetaminophen, senna-docusate    Per ID note:  Assessment:      Multifocal pneumonia   COPD exacerbation   Sputum  culture no growth to date   Chest x-ray-stable cardiomegaly/bilateral interstitial prominence (01/07/2019)   Procalcitonin level-0.02 (01/02/2019)   Procalcitonin level-0.02 (01/08/2019)   History of heavy smoking   Leukocytosis-on steroids   Morbid obesity   Clinically improved    Plan:      Continue doxycycline x3 more days   Discharge planning once cleared by pulmonary   Follow-up chest x-ray   Continue probiotics   Monitor electrolytes and renal functions closely   Monitor clinically    Discussed with patient in detail   Discussed with Dr.Baek    Per Medicine Note:  ASSESSMENT/PLAN   Patient Active Hospital Problem List:   Multilobar pneumonia    Acute COPD exacerbation   OSA on CPAP qhs    History of CAD    Anxiety      [name] a 48 y.o.femalewith a history of COPD, active tobacco use, CAD/MI, and anxietypresenting with shortness of breath.  she was hospitalized for pneumonia 3 months ago in [place].   She is admitted for pneumonia and acute COPD exacerbation. Elevated d-dimer, CTA chest negative for PE but concerning for extensive multilobar pneumonia. venous lower extremity doppler negative for DVT. Lactic acid and procalcitonin normal.  Legionella, influenza, respiratory pathogen panel negative.  Echo shows LVEF 60-65%, moderate pulmonary hypertension. Pulmonology and ID consulted.  Status post cefepime and doxycyline.  Currently on doxycycline only    Status post VQ scan which showed low probability for pulmonary embolism.      #Acute hypoxic respiratory failure   #Multilobar Pneumonia   #Acute COPD exacerbation  -CTA chest negative for PE but concerning for extensive multilobar pneumonia.  -ID following, appreciate recs.  Continue with 3 more days of doxycycline  -Pulmonology following, appreciate recs  -Patient's oral prednisone on taper course  -sputum cx showing moderate growth of mixed upper respiratory flora, viral respiratory panel negative, influenza negative, Legionella negative  -Status post VQ scan which showed low probability for pulmonary embolism.  -continue supplemental oxygen, currently went up to 2 L. BIPAP qhs   -continue breo, scheduled duonebs   -mucinex, incentive spirometry  -monitor on telemetry    #CAD  -EKG shows NSR, nonspecific T wave changes. Serial troponins x 3 negative.   -continue aspirin  -continue to monitor on telemetry    #Tobacco use  -advised cessation. Nicotine patch.     #OSA   -on cpap qhs      #Anxiety  -continue Resporal Zoloft and Neurontin    #Overactive bladder  -continue oxybutynin      Krystal Eaton, RN, MSN  Utilization Review Case Management  [phone] (F)

## 2019-01-10 NOTE — Plan of Care (Signed)
Ambulated in hallways with walker, o2 increased to 4l NC as desatted 87% and c/o feeling "winded", gioven xanax x1 for c/o anxiety, percocet 2 tabs x2 for c/o back pain, encouraged incentive spirometry use, tolerating diet, voiding and had BM without difficulty

## 2019-01-10 NOTE — Progress Notes (Signed)
PULMONARY PROGRESS NOTE                                                                                                              [phone]    Date Time: 01/10/19 7:35 PM  Patient Name: [name], [name] 48 y.o. female admitted with <principal problem not specified>  Admit Date: 01/01/2019    Patient status: Inpatient  Hospital Day: 8           Assessment:     Acute hypoxic respiratory failure on oxygen support  Multilobar pneumonia clinically improving  Pulmonary embolism ruled out patient  COPD exacerbation  Morbid obesity  Obstructive sleep apnea    Plan:       Patient does not have any more wheezing okay to continue to taper down steroid  Discharge planning  Home oxygen  Home BiPAP      Subjective:       Patient feels much better she feels like she is back to her baseline            Medications:     Current Facility-Administered Medications   Medication Dose Route Frequency    albuterol-ipratropium  3 mL Nebulization Q4H [place]    aspirin  81 mg Oral Daily    doxycycline  100 mg Oral Q12H [place]    enoxaparin  0.5 mg/kg Subcutaneous Daily    fluticasone furoate-vilanterol  1 puff Inhalation QAM    gabapentin  800 mg Oral TID    guaiFENesin  600 mg Oral Q12H SCH    insulin lispro  1-3 Units Subcutaneous QHS    insulin lispro  1-5 Units Subcutaneous TID AC    lactobacillus/streptococcus  1 capsule Oral Daily    nicotine  1 patch Transdermal Daily    oxybutynin  5 mg Oral TID    pantoprazole  40 mg Oral QAM AC    [START ON 01/11/2019] predniSONE  30 mg Oral QAM W/BREAKFAST    Followed by    Derrill Memo ON 01/12/2019] predniSONE  20 mg Oral QAM W/BREAKFAST    Followed by    Derrill Memo ON 01/13/2019] predniSONE  10 mg Oral QAM W/BREAKFAST    risperiDONE  3 mg Oral BID    sertraline  100 mg Oral BID       Review of Systems:        General ROS:  Afebrile no weight loss weight gain   ENT ROS:  No sore throat no nasal discharge   Endocrine ROS:  fatigue   Respiratory ROS:  No shortness of breath wheezing cough  chest congestion    Cardiovascular ROS:  No chest pain or palpitation   Gastrointestinal ROS:  No nausea vomiting diarrhea.  No melanotic stool   Genito-Urinary ROS:  No burning in the urine or hematuria   Musculoskeletal ROS:  No musculoskeletal deformities   Neurological ROS:  No stroke seizure disorder   Dermatological ROS:  No skin rash        Physical Exam:     Vitals:  01/10/19 1500   BP: 102/58   Pulse:    Resp: 18   Temp: 98.1 F (36.7 C)   SpO2: 95%         Intake/Output Summary (Last 24 hours) at 01/10/2019 1935  Last data filed at 01/10/2019 0900  Gross per 24 hour   Intake 444 ml   Output 1500 ml   Net -1056 ml           General appearance - no visible respiratory distress patient does not appear toxic  Mental status -  Alert and oriented x3  Eyes - EOMI PERRLA  Nose - no nasal discharge  Mouth - mucous membrane is moist.  No evidence of sore throat  Neck - no JVD lymphadenopathy or thyromegaly  Chest - clear to auscultation, no wheezing heard today  Heart - S1-S2 RRR no S3-S4 no murmur  Abdomen - soft nontender bowel sounds are normal with no hepatosplenomegaly  Neurological - no motor or sensory deficit  Extremities - no edema clubbing or cyanosis  Skin - no skin rash      Labs:     CBC w/Diff CMP   Recent Labs   Lab 01/10/19  0933 01/09/19  0319 01/08/19  0411   WBC 16.56* 13.97* 11.94*   Hgb 12.6 11.8 12.0   Hematocrit 40.4 37.2 38.5   Platelets 342 363* 364*   MCV 92.4 91.9 91.2       PT/INR         Recent Labs   Lab 01/10/19  0933 01/09/19  0319 01/08/19  0411   Sodium 140 138 137   Potassium 4.2 4.4 4.8   Chloride 107 105 105   CO2 _0 BUN 18.0 17.0 22.0*   Creatinine 0.7 0.7 0.7   Glucose 97 108* 106*   Calcium 9.7 9.2 10.0      Glucose POCT   Recent Labs   Lab 01/10/19  0933 01/09/19  0319 01/08/19  0411 01/07/19  0336 01/06/19  0326 01/05/19  0253 01/04/19  0449   Glucose 97 108* 106* 113* 88 118* 128*                ABGs:    ABG CollectionSite   Date Value Ref Range Status    01/02/2019 Left Radl  Final     Allen's Test   Date Value Ref Range Status   01/02/2019 Yes  Final     pH, Arterial   Date Value Ref Range Status   01/02/2019 7.312 (L) 7.350 - 7.450 Final     pCO2, Arterial   Date Value Ref Range Status   01/02/2019 34.6 (L) 35.0 - 45.0 mmHg Final     pO2, Arterial   Date Value Ref Range Status   01/02/2019 85.5 80.0 - 90.0 mmHg Final     HCO3, Arterial   Date Value Ref Range Status   01/02/2019 17.0 (L) 23.0 - 29.0 mEq/L Final     Base Excess, Arterial   Date Value Ref Range Status   01/02/2019 -7.9 (L) -2.0 - 2.0 mEq/L Final     O2 Sat, Arterial   Date Value Ref Range Status   01/02/2019 96.4 95.0 - 100.0 % Final       Urinalysis        Invalid input(s): LEUKOCYTESUR      Rads:   No results found.    Quillian Quince, MD  01/10/2019  7:35 PM

## 2019-01-10 NOTE — Plan of Care (Signed)
Discharge Recommendation: Home with supervision, Home with home health PT, Other(Comment)(PTS transport for assistance w/ stairs to enter home (3 FOS))  DME Recommended for Discharge: (Rollator at home)    Is an Occupational Therapy Evaluation Indicated at this time? This patient is already on OT caseload.     Treatment/Interventions: Personnel officer, Stair training  PT Frequency: 1-2x/wk     Next Visit Recommendation: PT - Next Visit Recommendation: 01/13/19     PMP Activity: Step 7 - Walks out of Room  Distance Walked (ft) (Step 6,7): 250 Feet(significantly increased time required to complete distance.)  (Please See Therapy Evaluation for device and assistance level needed)    Goals:   Goals  Goal Formulation: With patient  Time for Goal Acheivement: By time of discharge  Goals: Select goal  Pt Will Go Supine To Sit: independent, Partly met  Pt Will Perform Sit to Stand: independent, Partly met  Pt Will Transfer Bed/Chair: with rolling walker, modified independent, Partly met  Pt Will Ambulate: 101-150 feet, with rolling walker, modified independent, Partly met  Pt Will Go Up / Down Stairs: 1 flight, modified independent, With rail, Partly met

## 2019-01-10 NOTE — Plan of Care (Deleted)
Pt denied Chest pain all night. Nitro paste off as ordered.  Pt remained Npo from midnight for stress test today. Sinus rhythm on tele, vitals remain within normal limit.  Problem: Chest Pain  Goal: Vital signs and cardiac rhythm stable  Outcome: Progressing  Flowsheets (Taken 01/10/2019 0321)  Vital signs and cardiac rhythm stable: Monitor /assess vital signs/cardiac rhythms; Monitor labs; Assess the need for oxygen therapy and administer as ordered  Goal: Cardiac pain management  Outcome: Progressing  Flowsheets (Taken 01/10/2019 0321)  Cardiac pain management : Assess/report chest pain/or related discomfort to LIP immediately; Instruct patient to report any change in pain status; Assess pain/or related discomfort on admission, during daily assessment, before and after any intervention; Include patient and patient care companion in decisions related to pain management  Goal: Anxiety management/effective coping  Outcome: Progressing  Flowsheets (Taken 01/10/2019 0324)  Anxiety management/effective coping : Assess/report uncontrolled anxiety, depression or ineffective coping to LIP; Encourage patient to immediately report any increase in anxiety and/or depression; Offer reassurance to decrease anxiety; Include patient in decision making of their care and give updates on their health status  Goal: Patient/Patient Care Companion demonstrates understanding of disease process, treatment plan, medications, and discharge plan  Outcome: Progressing  Flowsheets (Taken 01/10/2019 0324)  Patient/Patient Care Companion demonstrates understanding of disease process, treatment plan, medications and discharge plan     : Educate patient to immediately report any chest pain/equivalent to RN; Reinforce regarding cardiac diet and any fluid parameters; Assist patient/patient care companion to identify measures for cardiac risk factor management; Provide appropriate resources and referrals

## 2019-01-10 NOTE — Progress Notes (Signed)
Infectious Diseases & Tropical Medicine  Progress Note    01/10/2019   [name] [employer]:[id number],VBT:[id number] is a 48 y.o. female,       Assessment:      Multifocal pneumonia   COPD exacerbation   Sputum culture no growth to date   Chest x-ray-stable cardiomegaly/bilateral interstitial prominence (01/07/2019)   Procalcitonin level-0.02 (01/02/2019)   Procalcitonin level-0.02 (01/08/2019)   History of heavy smoking   Leukocytosis-on steroids   Morbid obesity   Clinically improved    Plan:      Continue doxycycline x3 more days   Discharge planning once cleared by pulmonary   Follow-up chest x-ray   Continue probiotics   Monitor electrolytes and renal functions closely   Monitor clinically      Discussed with patient in detail   Discussed with Dr.Baek    ROS:     General:  no fever, no chills, no rigor, comfortable, feeling better  HEENT: no neck pain, no throat pain  Endocrine:  no fatigue, no night sweats  Respiratory: Cough and shortness of breath improving  Cardiovascular: no chest pain   Gastrointestinal: left lower abdominal muscle pain improving,no N/V/D  Genito-Urinary: no dysuria, increased urinary frequency, trouble voiding, or hematuria   Musculoskeletal: no edema, right hip pain improved  Neurological: c/o generalized weakness   Dermatological: no rash, no ulcer    Physical Examination:     Blood pressure 106/68, pulse 66, temperature 97.5 F (36.4 C), temperature source Oral, resp. rate 20, height 1.753 m (_0 ), weight 151 kg (332 lb 14.4 oz), SpO2 95 %.     General Appearance: Comfortable, and in no acute distress.   HEENT: Pupils are equal, round, and reactive to light.    Lungs: Scattered rhonchi   Heart:  Regular rate and rhythm   Chest: Symmetric chest wall expansion.    Abdomen: soft ,non tender,no hepatosplenomegaly,good bowel sounds   Neurological: No focal deficit   Extremities: No edema    Laboratory And Diagnostic Studies:     Recent Labs     01/09/19  0319  01/08/19  0411   WBC 13.97* 11.94*   Hgb 11.8 12.0   Hematocrit 37.2 38.5   Platelets 363* 364*     Recent Labs     01/09/19  0319 01/08/19  0411   Sodium 138 137   Potassium 4.4 4.8   Chloride 105 105   CO2 24 25   BUN 17.0 22.0*   Creatinine 0.7 0.7   Glucose 108* 106*   Calcium 9.2 10.0     No results for input(s): AST, ALT, ALKPHOS, PROT, ALB in the last 72 hours.    Current Meds:      Scheduled Meds: PRN Meds:    albuterol-ipratropium, 3 mL, Nebulization, Q4H SCH  aspirin, 81 mg, Oral, Daily  doxycycline, 100 mg, Oral, Q12H SCH  enoxaparin, 0.5 mg/kg, Subcutaneous, Daily  fluticasone furoate-vilanterol, 1 puff, Inhalation, QAM  gabapentin, 800 mg, Oral, TID  guaiFENesin, 600 mg, Oral, Q12H SCH  insulin lispro, 1-3 Units, Subcutaneous, QHS  insulin lispro, 1-5 Units, Subcutaneous, TID AC  lactobacillus/streptococcus, 1 capsule, Oral, Daily  nicotine, 1 patch, Transdermal, Daily  oxybutynin, 5 mg, Oral, TID  pantoprazole, 40 mg, Oral, QAM AC  predniSONE, 40 mg, Oral, QAM W/BREAKFAST    Followed by  Derrill Memo ON 01/11/2019] predniSONE, 30 mg, Oral, QAM W/BREAKFAST    Followed by  Derrill Memo ON 01/12/2019] predniSONE, 20 mg, Oral, QAM W/BREAKFAST    Followed by  [  START ON 01/13/2019] predniSONE, 10 mg, Oral, QAM W/BREAKFAST  risperiDONE, 3 mg, Oral, BID  sertraline, 100 mg, Oral, BID        Continuous Infusions:   acetaminophen, 650 mg, Q4H PRN    Or  acetaminophen, 650 mg, Q4H PRN  ALPRAZolam, 0.5 mg, BID PRN  alum & mag hydroxide-simethicone, 20 mL, Q4H PRN  benzonatate, 200 mg, TID PRN  dextrose, 15 g of glucose, PRN    And  glucagon (rDNA), 1 mg, PRN    And  dextrose, 125 mL, PRN  diphenhydrAMINE, 25 mg, Q6H PRN  guaiFENesin-dextromethorphan, 5 mL, Q6H PRN  influenza, 0.5 mL, Prior to discharge  naloxone, 0.2 mg, PRN  nitroglycerin, 0.4 mg, Q5 Min PRN  ondansetron, 4 mg, Q6H PRN    Or  ondansetron, 4 mg, Q6H PRN  oxyCODONE-acetaminophen, 2 tablet, Q6H PRN  senna-docusate, 2 tablet, QHS PRN          Aryaa Bunting A. Gustavus Messing,  M.D.  01/10/2019  9:21 AM

## 2019-01-10 NOTE — Plan of Care (Signed)
Pt still on O2 at 1L, No distress noted overnight. Lungs remain diminished, Vitals within normal limit.   Problem: Compromised Hemodynamic Status  Goal: Vital signs and fluid balance maintained/improved  Outcome: Progressing  Flowsheets (Taken 01/09/2019 2042 by Octavio Manns, RN)  Vital signs and fluid balance are maintained/improved: Position patient for maximum circulation/cardiac output;Monitor/assess vitals and hemodynamic parameters with position changes;Monitor/assess lab values and report abnormal values;Monitor intake and output. Notify LIP if urine output is less than 30 mL/hour.     Problem: Inadequate Gas Exchange  Goal: Adequate oxygenation and improved ventilation  Outcome: Progressing  Flowsheets (Taken 01/09/2019 2042 by Octavio Manns, RN)  Adequate oxygenation and improved ventilation: Assess lung sounds;Teach/reinforce use of incentive spirometer 10 times per hour while awake, cough and deep breath as needed;Increase activity as tolerated/progressive mobility;Plan activities to conserve energy: plan rest periods  Goal: Patent Airway maintained  Outcome: Progressing     Problem: Inadequate Airway Clearance  Goal: Normal respiratory rate/effort achieved/maintained  Outcome: Progressing  Flowsheets (Taken 01/03/2019 2259 by Lelan Pons, RN)  Normal respiratory rate/effort achieved/maintained: Plan activities to conserve energy: plan rest periods     Problem: Inadequate Tissue Perfusion-Venous  Goal: Tissue perfusion is adequate-venous  Outcome: Progressing  Flowsheets (Taken 01/10/2019 0701)  Tissue perfusion is adequate-venous : Increase activity as tolerated / progressive mobility     Problem: Chest Pain  Goal: Vital signs and cardiac rhythm stable  Outcome: Progressing  Flowsheets (Taken 01/10/2019 0321)  Vital signs and cardiac rhythm stable: Monitor Glenda Chroman vital signs/cardiac rhythms;Monitor labs;Assess the need for oxygen therapy and administer as ordered  Goal: Cardiac pain  management  Outcome: Progressing  Flowsheets (Taken 01/10/2019 0321)  Cardiac pain management : Assess/report chest pain/or related discomfort to LIP immediately;Instruct patient to report any change in pain status;Assess pain/or related discomfort on admission, during daily assessment, before and after any intervention;Include patient and patient care companion in decisions related to pain management  Goal: Anxiety management/effective coping  Outcome: Progressing  Flowsheets (Taken 01/10/2019 0324)  Anxiety management/effective coping : Assess/report uncontrolled anxiety, depression or ineffective coping to LIP;Encourage patient to immediately report any increase in anxiety and/or depression;Offer reassurance to decrease anxiety;Include patient in decision making of their care and give updates on their health status  Goal: Patient/Patient Care Companion demonstrates understanding of disease process, treatment plan, medications, and discharge plan  Outcome: Progressing  Flowsheets (Taken 01/10/2019 0324)  Patient/Patient Care Companion demonstrates understanding of disease process, treatment plan, medications and discharge plan     : Educate patient to immediately report any chest pain/equivalent to RN;Reinforce regarding cardiac diet and any fluid parameters;Assist patient/patient care companion to identify measures for cardiac risk factor management;Provide appropriate resources and referrals

## 2019-01-10 NOTE — PT Progress Note (Signed)
[facility]  Physical Therapy Attempt Note    Patient:  [name] MRN#:  [id number]  Unit:  [place] Room/Bed:  E6148/D0735-43      Physical Therapy treatment attempted on 01/10/2019 at 11:33 AM unable to complete secondary to patient declining 2/2 to not feeling well. Therapist will reattempt PM today as able.     Bobetta Lime PT DPT  [phone]  01/10/2019 11:33 AM

## 2019-01-11 DIAGNOSIS — J9601 Acute respiratory failure with hypoxia: Secondary | ICD-10-CM | POA: Diagnosis present

## 2019-01-11 DIAGNOSIS — N3281 Overactive bladder: Secondary | ICD-10-CM | POA: Diagnosis present

## 2019-01-11 DIAGNOSIS — Z720 Tobacco use: Secondary | ICD-10-CM

## 2019-01-11 DIAGNOSIS — I2510 Atherosclerotic heart disease of native coronary artery without angina pectoris: Secondary | ICD-10-CM | POA: Diagnosis present

## 2019-01-11 DIAGNOSIS — F419 Anxiety disorder, unspecified: Secondary | ICD-10-CM | POA: Diagnosis present

## 2019-01-11 DIAGNOSIS — J441 Chronic obstructive pulmonary disease with (acute) exacerbation: Secondary | ICD-10-CM | POA: Diagnosis present

## 2019-01-11 DIAGNOSIS — J189 Pneumonia, unspecified organism: Secondary | ICD-10-CM | POA: Diagnosis present

## 2019-01-11 DIAGNOSIS — G4733 Obstructive sleep apnea (adult) (pediatric): Secondary | ICD-10-CM | POA: Diagnosis present

## 2019-01-11 MED ORDER — BENZONATATE 200 MG PO CAPS
200.00 mg | ORAL_CAPSULE | Freq: Three times a day (TID) | ORAL | 0 refills | Status: DC | PRN
Start: 2019-01-11 — End: 2019-02-04

## 2019-01-11 MED ORDER — RISAQUAD PO CAPS
1.00 | ORAL_CAPSULE | Freq: Every day | ORAL | 0 refills | Status: AC
Start: 2019-01-12 — End: 2019-01-27

## 2019-01-11 MED ORDER — ALBUTEROL-IPRATROPIUM 2.5-0.5 (3) MG/3ML IN SOLN
3.00 mL | Freq: Four times a day (QID) | RESPIRATORY_TRACT | Status: DC | PRN
Start: 2019-01-11 — End: 2019-01-11

## 2019-01-11 MED ORDER — NICOTINE 14 MG/24HR TD PT24
1.00 | MEDICATED_PATCH | Freq: Every day | TRANSDERMAL | 0 refills | Status: AC
Start: 2019-01-12 — End: 2019-02-11

## 2019-01-11 MED ORDER — ASPIRIN 81 MG PO CHEW
81.00 mg | CHEWABLE_TABLET | Freq: Every day | ORAL | 0 refills | Status: AC
Start: 2019-01-12 — End: 2019-02-11

## 2019-01-11 MED ORDER — PREDNISONE 10 MG PO TABS
ORAL_TABLET | ORAL | 0 refills | Status: AC
Start: 2019-01-13 — End: 2019-01-17

## 2019-01-11 MED ORDER — DOXYCYCLINE MONOHYDRATE 100 MG PO CAPS
100.00 mg | ORAL_CAPSULE | Freq: Two times a day (BID) | ORAL | 0 refills | Status: AC
Start: 2019-01-11 — End: 2019-01-14

## 2019-01-11 MED ORDER — ALBUTEROL-IPRATROPIUM 2.5-0.5 (3) MG/3ML IN SOLN
3.00 mL | Freq: Four times a day (QID) | RESPIRATORY_TRACT | 0 refills | Status: DC | PRN
Start: 2019-01-11 — End: 2019-11-29

## 2019-01-11 MED ORDER — FLUTICASONE FUROATE-VILANTEROL 200-25 MCG/INH IN AEPB
1.00 | INHALATION_SPRAY | Freq: Every morning | RESPIRATORY_TRACT | 0 refills | Status: DC
Start: 2019-01-11 — End: 2019-02-04

## 2019-01-11 NOTE — Plan of Care (Signed)
Problem: Safety  Goal: Patient will be free from infection during hospitalization  Outcome: Progressing  Flowsheets (Taken 01/11/2019 1234)  Free from Infection during hospitalization: Assess and monitor for signs and symptoms of infection; Monitor lab/diagnostic results; Encourage patient and family to use good hand hygiene technique     Problem: Pain  Goal: Pain at adequate level as identified by patient  Outcome: Progressing  Flowsheets (Taken 01/09/2019 2042 by Octavio Manns, RN)  Pain at adequate level as identified by patient: Identify patient comfort function goal;Reassess pain within 30-60 minutes of any procedure/intervention, per Pain Assessment, Intervention, Reassessment (AIR) Cycle;Evaluate if patient comfort function goal is met;Evaluate patient's satisfaction with pain management progress     Problem: Inadequate Gas Exchange  Goal: Adequate oxygenation and improved ventilation  Outcome: Progressing  Flowsheets (Taken 01/09/2019 2042 by Octavio Manns, RN)  Adequate oxygenation and improved ventilation: Assess lung sounds;Teach/reinforce use of incentive spirometer 10 times per hour while awake, cough and deep breath as needed;Increase activity as tolerated/progressive mobility;Plan activities to conserve energy: plan rest periods     Problem: Inadequate Tissue Perfusion-Venous  Goal: Tissue perfusion is adequate-venous  Outcome: Progressing  Flowsheets (Taken 01/10/2019 0701 by Fanny Bien, RN)  Tissue perfusion is adequate-venous : Increase activity as tolerated / progressive mobility     Problem: Pain interferes with ability to perform ADL  Goal: Pain at adequate level as identified by patient  Outcome: Progressing  Flowsheets (Taken 01/09/2019 2042 by Octavio Manns, RN)  Pain at adequate level as identified by patient: Identify patient comfort function goal;Reassess pain within 30-60 minutes of any procedure/intervention, per Pain Assessment, Intervention, Reassessment (AIR) Cycle;Evaluate if  patient comfort function goal is met;Evaluate patient's satisfaction with pain management progress

## 2019-01-11 NOTE — Progress Notes (Signed)
Pulse Oximetry Testing    Document patient's oxygen at rest :   _____% on room air, at rest (No ranges please).      _____% on oxygen, at rest on_____LPM via NC (document number of liters needed to maintain 89% or greater)       Next, test patient with ambulation (or exertion in bed IF bed bound) and document below:   _____% on room air, with exertion (if 88% or below, provide additional O2 and see next step)   _____% on oxygen, with exertion, at_____LPM via NC (document number of liters needed to maintain 89% or greater)      All tests must be done during the same session.     Please copy and paste the above template and document results in a PROGRESS NOTE.     Testing to qualify for home oxygen must be no earlier than 48 hours prior to discharge, or it will need to be repeated.     CALL [phone] when completed.

## 2019-01-11 NOTE — Progress Notes (Signed)
PULMONARY PROGRESS NOTE    Date Time: 01/11/19 8:31 AM  Patient Name: [name]  Attending Physician: Marya Landry, MD    Problem List:   Patient Active Problem List   Diagnosis    SOB (shortness of breath)       Subjective:   Overall feeling better, still with shortness of breath on exertion.  Minimal nonproductive cough    Medications:      Scheduled Meds: PRN Meds:    aspirin, 81 mg, Oral, Daily  doxycycline, 100 mg, Oral, Q12H SCH  enoxaparin, 0.5 mg/kg, Subcutaneous, Daily  fluticasone furoate-vilanterol, 1 puff, Inhalation, QAM  gabapentin, 800 mg, Oral, TID  guaiFENesin, 600 mg, Oral, Q12H SCH  insulin lispro, 1-3 Units, Subcutaneous, QHS  insulin lispro, 1-5 Units, Subcutaneous, TID AC  lactobacillus/streptococcus, 1 capsule, Oral, Daily  nicotine, 1 patch, Transdermal, Daily  oxybutynin, 5 mg, Oral, TID  pantoprazole, 40 mg, Oral, QAM AC  predniSONE, 30 mg, Oral, QAM W/BREAKFAST    Followed by  Derrill Memo ON 01/12/2019] predniSONE, 20 mg, Oral, QAM W/BREAKFAST    Followed by  Derrill Memo ON 01/13/2019] predniSONE, 10 mg, Oral, QAM W/BREAKFAST  risperiDONE, 3 mg, Oral, BID  sertraline, 100 mg, Oral, BID        Continuous Infusions:   acetaminophen, 650 mg, Q4H PRN    Or  acetaminophen, 650 mg, Q4H PRN  albuterol-ipratropium, 3 mL, Q6H PRN  ALPRAZolam, 0.5 mg, BID PRN  alum & mag hydroxide-simethicone, 20 mL, Q4H PRN  benzonatate, 200 mg, TID PRN  dextrose, 15 g of glucose, PRN    And  glucagon (rDNA), 1 mg, PRN    And  dextrose, 125 mL, PRN  diphenhydrAMINE, 25 mg, Q6H PRN  guaiFENesin-dextromethorphan, 5 mL, Q6H PRN  influenza, 0.5 mL, Prior to discharge  naloxone, 0.2 mg, PRN  nitroglycerin, 0.4 mg, Q5 Min PRN  ondansetron, 4 mg, Q6H PRN    Or  ondansetron, 4 mg, Q6H PRN  oxyCODONE-acetaminophen, 2 tablet, Q6H PRN  senna-docusate, 2 tablet, QHS PRN            Physical Exam:     VITAL SIGNS   Temp:  [97.1 F (36.2 C)-98.3 F (36.8 C)] 97.1 F (36.2 C)  Heart Rate:  [63-77] 70  Resp Rate:  [18-19]  19  BP: (85-114)/(43-70) 113/70  POCT Glucose Result (Read Only)  Avg: 132  Min: 82  Max: 208  SpO2: 96 %  Telemetry:  Vent Settings  Vent Mode: S/T  FiO2: 30 %  Resp Rate (Set): 16  PEEP/EPAP: 8 cm H20  Pressure Support / IPAP: 12 cmH20    Intake/Output Summary (Last 24 hours) at 01/11/2019 0831  Last data filed at 01/10/2019 0900  Gross per 24 hour   Intake 444 ml   Output    Net 444 ml          Lines/Drains/Airways:    Patient Lines/Drains/Airways Status    Active PICC Line / CVC Line / PIV Line / Drain / Airway / Intraosseous Line / Epidural Line / ART Line / Line / Wound / Pressure Ulcer / NG/OG Tube     Name:   Placement date:   Placement time:   Site:   Days:    Peripheral IV 01/07/19 Right;Posterior Wrist   01/07/19    2000    Wrist   3                APPEARANCE: no distress  HEENT: mucous membranes moist,  posterior pharynx clear, no lesions   NECK: Neck supple, no adenopathy, no masses  HEART: RRR with normal S1 and S2 ,no murmurs, no gallops  LUNG: Rare wheezes, no rales or rhonchi, diminished breath sounds throughout  ABDOMEN: Abdomen soft, non-tender. BS normal. No masses,  No organomegaly obese  EXTREMITIES: no clubbing, cyanosis, edema, pulses 2+ symmetric, no cords palpated.   NEURO: Awake, alert and oriented, nonfocal  SKIN: Skin color & turgor normal. No rashes or lesions    Labs:     Recent Labs   Lab 01/10/19  0933 01/09/19  0319 01/08/19  0411   Glucose 97 108* 106*   BUN 18.0 17.0 22.0*   Creatinine 0.7 0.7 0.7   Calcium 9.7 9.2 10.0   Sodium 140 138 137   Potassium 4.2 4.4 4.8   Chloride 107 105 105   CO2 _0 Recent Labs   Lab 01/10/19  0933 01/09/19  0319 01/08/19  0411   WBC 16.56* 13.97* 11.94*   Hgb 12.6 11.8 12.0   Hematocrit 40.4 37.2 38.5   MCV 92.4 91.9 91.2   MCH 28.8 29.1 28.4   MCHC 31.2* 31.7 31.2*   Platelets 342 363* 364*       Cardiac enzymes:       No results for input(s): PTT, PT, INR in the last 72 hours.    ABGs:    ABG CollectionSite   Date Value Ref Range Status    01/02/2019 Left Radl  Final     Allen's Test   Date Value Ref Range Status   01/02/2019 Yes  Final     pH, Arterial   Date Value Ref Range Status   01/02/2019 7.312 (L) 7.350 - 7.450 Final     pCO2, Arterial   Date Value Ref Range Status   01/02/2019 34.6 (L) 35.0 - 45.0 mmHg Final     pO2, Arterial   Date Value Ref Range Status   01/02/2019 85.5 80.0 - 90.0 mmHg Final     HCO3, Arterial   Date Value Ref Range Status   01/02/2019 17.0 (L) 23.0 - 29.0 mEq/L Final     Base Excess, Arterial   Date Value Ref Range Status   01/02/2019 -7.9 (L) -2.0 - 2.0 mEq/L Final     O2 Sat, Arterial   Date Value Ref Range Status   01/02/2019 96.4 95.0 - 100.0 % Final     Lab Results   Component Value Date    LACTATE 1.5 01/02/2019        Rads:     Radiology Results (24 Hour)     ** No results found for the last 24 hours. **            Assessment:   COPD with exacerbation   hypoxic respiratory insufficiency (VQ and Dopplers negative for thromboembolic disease)   Morbid obesity   obstructive sleep apnea  Multilobar pneumonia    Nicotine dependence  Pulmonary hypertension, EF 65%    Plan:   Continue Breo  Nicotine patch  Prednisone with taper  Doxycycline  Home O2 and BiPAP already in place  Lovenox for DVT prophylaxis  Discharge planning      [place] discussed with patient/nursing. All questions answered  See CPOE    I have personally reviewed the patient's results and spent at least 35 min of time for the patient's plan of care independent of procedures.     Signed by:  Clarene Duke, MD  Date/Time: 01/11/19 8:31 AM  [facility]  [phone]

## 2019-01-11 NOTE — Progress Notes (Signed)
Home health referral sent to seven agencies, no accepting agencies as of yet due to insurance. Awaiting response from 4 agencies. Referral sent to additional agencies. Covering liaison to follow up Monday

## 2019-01-11 NOTE — Discharge Instr - Diet (Signed)
Cardiac diet: limit salt ( sodium) in your diet. Limit foods high in animal fat.  Consistent carbohydrate diet

## 2019-01-11 NOTE — Discharge Instr - Activity (Signed)
Activity as tolerated

## 2019-01-11 NOTE — Progress Notes (Signed)
Pt discharging home today. Pt will be transported via stretcher with O2 administered en route. Transport arranged with PTS. Pick up time is 1 pm. Pt & pt's nurse, [place], notified. Janett Billow, [facility] liaison, notified to set up [facility] services for PT/OT.     Tonita Cong, Therapist, sports, BSN  Case Manager   [facility]  [address]   [place], [place] [address]  [phone]       01/11/19 1150   Discharge Disposition   Physical Discharge Disposition Home   Mode of Transportation Ambulance   Patient/Family/POA notified of transfer plan Yes   Patient agreeable to discharge plan/expected d/c date? Yes   Bedside nurse notified of transport plan? Yes

## 2019-01-11 NOTE — Discharge Instructions (Signed)
When You Have Pneumonia  You have been diagnosed with pneumonia. This is a serious lung infection. Most cases of pneumonia are caused by bacteria. But viruses, mycoplasmas, and fungi can also cause pneumonia. So can inhaling certain chemicals. Pneumonia most often occurs in older adults, young children, and people with chronic health problems.   Home care   Take your medicine exactly as directed. Dont skip doses. Continue taking your antibiotics as directed until they are all gone, even if you start to feel better. This will prevent the pneumonia from coming back.   Drink at least 8 glasses of water daily, unless directed otherwise. This helps to loosen and thin lung secretions so that you can cough them up.   Use a cool-mist humidifier in your bedroom. Be sure to clean the humidifier daily.   Dont use medicines to suppress your cough unless your cough is dry, painful, or interferes with your sleep. Coughing up mucus is normal and helps you recover. You may use an expectorant if your healthcare provider says its OK.   You can use warm compresses or a heating pad on the lowest setting to relieve chest discomfort. Use several times a day for 15 to 20 minutes at a time. To prevent injury to your skin, set the temperature to warm, not hot. Dont put the compress or pad directly on your skin. Make certain it has a cover or wrap it in a towel. This is to prevent skin burns.   Get plenty of rest until your fever, shortness of breath, and chest pain go away.   Plan to get a flu shot every year. The flu is a common cause of pneumonia. Getting a flu shot every year can help prevent both the flu and pneumonia.    Getting the pneumococcal vaccine  Talk with your healthcare provider about getting the pneumococcal vaccine. There are 2 different pneumonia vaccines. You may need to get both vaccines. Pneumococcal pneumonia is caused by bacteria that spread from person to person. It can cause minor problems, such as ear  infections. But it can also turn into life-threatening illnesses of the lungs (pneumonia), the covering of the brain and spinal cord (meningitis), and the blood (bacteremia).   Children under 48 years of age, adults over age 48, people with certain health conditions, and smokers are at the highest risk of pneumococcal disease. This vaccine can help prevent pneumococcal disease in both adults and children. Some people should not have the vaccine. Make sure to ask your healthcare provider if you should have the vaccine.   Follow-up care  Make a follow-up appointment, or as directed.   When to call your healthcare provider  Call your healthcare provider right away if you have any of the following:   Fever of 100.74F ( 38C) or higher, or as directed by your healthcare provider   Mucus from the lungs (sputum) thats yellow, green, bloody, or smells bad   A large amount of sputum   Vomiting   Symptoms that get worse  Call 911  Call 911 right away if you have any of the following:    Chest pain   Trouble breathing   Blue lips or fingernails  [employer]Well last reviewed this educational content on 04/21/2047   2000-2019 [employer]. [address], [place] [address]. All rights reserved. This information is not intended as a substitute for professional medical care. Always follow your healthcare professional's instructions.

## 2019-01-11 NOTE — Discharge Instr - AVS First Page (Addendum)
[facility] DISCHARGE INSTRUCTIONS     Date of Admission: 01/01/2019    Date of Discharge: 01/11/2019    Discharge Physician: Marya Landry    Dear [name],     Thank you for choosing [facility] for your emergency care needs. We strive to provide EXCELLENT care to you and your family.     In an effort to explain clearly why you were here in the hospital, I've written a very brief summary. I hope that you find it useful. Other details including formal diagnosis, medication changes, follow up appointment recommendations, and access to MyChart for formal medical records can be found in this packet.       You were admitted for Acute respiratory failure with hypoxia for which you were started on doxycycline, prednisone. Make sure to follow up with our [facility] or [name], MD your primary care doctor for follow-up. I cannot stress the importance of follow up enough.    Make sure to bring the following to your doctors appointments:  Medications in their original bottles  Glucometer/blood sugar log (if diabetic)   Weight log (if you have heart failure)    If you are unable to obtain an appointment, unable to obtain newly prescribed medications, or are unclear about any of your discharge instructions please contact me at [phone] (M-F, 8am-3pm) or weekends and after hours via the hospital operator [phone], the hospital case manager, or your primary care physician.    Finally, as your discharging physician, you may be receiving a survey which is regarding my care. I would greatly value and appreciate your feedback as I strive for excellence.     Respectfully yours,    [place] Discharge Information     Your doctor has ordered Skilled Nursing, Physical Therapy and Occupational Therapy in-home service(s) for you while you recuperate at home, to assist you in the transition from hospital to home.      The agency that you or your  representative chose to provide the service:  Name of [place] Placement: Other (comment box)([place]  )]  [phone]          The above services were set up by:  Rexene Alberts, RN, BSN  [facility] Liaison)   Phone  [phone]      IF YOU HAVE NOT HEARD [place] 24-48 HOURS AFTER DISCHARGE PLEASE CALL YOUR AGENCY TO ARRANGE A TIME FOR YOUR FIRST VISIT. FOR ANY SCHEDULING CONCERNS OR QUESTIONS RELATED TO HOME HEALTH, SUCH AS TIME OR DATE PLEASE CONTACT YOUR HOME HEALTH AGENCY AT THE NUMBER LISTED ABOVE.

## 2019-01-11 NOTE — Progress Notes (Signed)
Home Health Referral          Referral from [facility] (Case Manager) for home health care upon discharge.    By [employer], the patient has the right to freely choose a home care provider.  Arrangements have been made with:     A company of the patients choosing. We have supplied the patient with a listing of providers in your area who asked to be included and participate in Medicare.   [facility], formerly [facility], a home care agency that provides adult home care services and participates in Medicare   The preferred provider of your insurance company. Choosing a home care provider other than your insurance company's preferred provider may affect your insurance coverage.      Home Health Discharge Information     Your doctor has ordered Skilled Nursing, Physical Therapy and Occupational Therapy in-home service(s) for you while you recuperate at home, to assist you in the transition from hospital to home.      The agency that you or your representative chose to provide the service:  Name of [place] Placement: Other (comment box)] to be determined    The above services were set up by:  Suzzette Righter  [facility] Liaison)   Phone      [phone]                         IF YOU HAVE NOT HEARD [place] 24-48 HOURS AFTER DISCHARGE PLEASE CALL YOUR AGENCY TO ARRANGE A TIME FOR YOUR FIRST VISIT. FOR ANY SCHEDULING CONCERNS OR QUESTIONS RELATED TO HOME HEALTH, SUCH AS TIME OR DATE PLEASE CONTACT YOUR HOME HEALTH AGENCY AT THE NUMBER LISTED ABOVE.    HOME HEALTH REFERRAL    PATIENT"S DEMOGRAPHICS:        Name: [name]    Discharge Address: [address]  [address] [place] [address]      Primary Telephone Number:  [phone]  Secondary Telephone Number: [phone]  Emergency Contact and Number: Extended Emergency Contact Information  Primary Emergency Contact: Sunnie Nielsen  Mobile Phone: [phone]  Relation: Mother         Ordering Physician: Dr. Mallie Darting    Following Physician: Dr. Jodi Mourning    PCP: [name], MD, [phone]    Agreeable to Follow: Yes  Date/Time of Call:     Language/Communication Barrier:       No    Primary Diagnosis and Reason for Services:       SOB (shortness of breath) R06.02       Discharge Date: 01-11-19  Referral Source (PACC/Hospital/Unit): Suzzette Righter, RN  Referral Date: 01/11/19    Home Health face-to-face (FTF) Encounter (Order [id number])   Consult   Date: 01/11/2019 Department: [facility] Ordering/Authorizing: Marya Landry, MD   Order Information     Order Date/Time Release Date/Time Start Date/Time End Date/Time   01/11/19 11:44 AM None 01/11/19 11:45 AM 01/11/19 11:45 AM   Order Details     Frequency Duration Priority Order Class   Once 1 occurrence Routine Hospital Performed   Standing Order Information     Remaining Occurrences Interval Last Released      0/1 Once 01/11/2019            Provider Information     Ordering User Ordering Provider Authorizing Provider   Elberta Fortis,  Janus Molder, RN Marya Landry, MD Marya Landry, MD   Attending Provider(s) Admitting Provider PCP   Donia Guiles, MD; Baruch Gouty, MD; Adrienne Mocha, MD; Marya Landry, MD Baruch Gouty, MD [name], MD   Verbal Order Info     Action Created on Order Mode Entered by Responsible Provider Signed by Signed on   Ordering 01/11/19 1144 Telephone with readback Suzzette Righter, RN Marya Landry, MD     Order Questions     Question Answer Comment   Date of face-to-face (FTF) encounter: 01/11/2019    Medical conditions that necessitate Home Health care: B. Functional impairment due to recent hospitalization/procedure/treatment     C. Risk for complication/infection/pain requiring follow up and monitoring     D. Chronic illness & risk for re-hospitalization due to unstable disease status     E. Exacerbation of disease requiring follow up monitoring     F. New diagnosis  & treatment requiring follow up monitoring and management    Clinical findings that support the need for Skilled Nursing. SN will: C. Monitor for signs and symptoms of exacerbation of disease and management     D. Review medication reconciliation, manage and educate on use and side effects     G. Educate on new diagnosis, treatment & management to prevent re-hospitalization     H. Assess cardiopulmonary status and monitor for signs &symptoms of exacerbation     N. Instruct on oxygen use and safety    Clinical findings that support the need for Physical Therapy. PT will A. Evaluate and treat functional impairment and improve mobility     C. Educate on weight bearing status, stair/gait training, balance & coordination     D. Provide services to help restore function, mobility, and releive pain     E. Educate on functional mobility; bed, chair, sit, stand and transfer activities     F. Perform home safety assessment & develop safe in home exercise program     G. Implement activities to improve stance time, cadence & step length     I. Instruct on restorative activities to restore ability to perform ADL    Clinical findings support the need for OT (needs SN/PT order).OT will A. Develop in home program to improve ability to perform ADLs     B. Develop restorative program to improve mobility and independence     C. Educate on recovery and maintenance skills     E. Educate on basic motor function and reasoning abilities    Clinical findings that support the need for SLP. ST will F. N/A    Per clinical findings, following services are medically necessary: Skilled Nursing     PT     OT    Evidence this patient is homebound because: B. Profound weakness, poor balance/unsteady gait d/t illness/treatment/procedure    Other (please specify) Dr. Jodi Mourning to follow in community          Process Instructions     Please select [place] medically necessary.     Based on the above findings, I certify that this  patient is confined to the home and needs intermittent skilled nursing care, physical therapry and / or speech therapy or continues to need occupational therapy. The patient is under my care, and I have initiated the establishment of the plan of care. This patient will be followed by a physician who will periodically review the plan of care.  Collection Information     Consult Order Info     ID Description Priority Start Date Start Time   [id number] [place] face-to-face (FTF) Encounter Routine 01/11/2019 11:45 AM   Provider Specialty Referred to   ______________________________________ _____________________________________   Acknowledgement Info     For At Acknowledged By Acknowledged On   Placing Order 01/11/19 [place] Suzzette Righter, RN 01/11/19 1144   Verbal Order Info     Action Created on Order Mode Entered by Responsible Provider Signed by Signed on   Ordering 01/11/19 1144 Telephone with Daisy Floro, RN Marya Landry, MD     Patient Information     Patient Name  [name], [name] Sex  Female DOB  02/08/1971   Additional Information     Associated Reports External References   Priority and Order Details [place]        Patient Name: [name], [name]     MRN: [id number]     CSN: [id number]       [place] #   [id number] Patient Class   Inpatient Service  Medicine Accommodation Code  Intermediate Care     Admission Information    Admitting Physician:  Attending Physician: Baruch Gouty, MD  Marya Landry, MD Unit  AX 25NO INTERME* L&D Status     Admitting Diagnosis: SOB (shortness of breath); History of COPD; SOB (shortness of breath); SOB* Room / Bed  A2515/A2515-01 L&D  Last Menstrual Cycle     Chief Complaint: Shortness of Breath     Admit Type:  Admit Date/Time:  Discharge Date/Time: Emergency  01/01/2019 / 2001   /  Length of Stay: 9 Days   L&D EDD   Estimated Date of Delivery: None noted.      Patient Information            Home Address: [address]  [place] [address] Employer:  Employer Address:     ,     Main Phone: [phone] Employer Phone:    SSN: [id number]     DOB: [birth year] (2 yrs)     Sex: Female Primary Care Physician: [name], MD   Marital Status: Single Referring Physician:       No ref. 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    Insurance Name: [place] Name: [facility]   Insurance Address:   [place], [place] Subscriber DOB: 21-Oct-1971     Subscriber ID: [id number]   Insurance Phone: [phone] Pt Relationship to Sub:   Self   Insurance ID:  Group Name:  Preauthorization #:    Group #: [id number] Preauthorization Days:      Secondary [employer] Name: - Veterinary surgeon Name:    Nutritional therapist:     ,   Veterinary surgeon DOB:      Subscriber ID:    Veterinary surgeon:  Pt Relationship to Sub:      Insurance ID:      Group Name:  Preauthorization #:    Group #:  Preauthorization Days:      [facility] Name: - [place] Name:    Insurance underwriter Address:     ,   Veterinary surgeon DOB:      Subscriber ID:    Veterinary surgeon:  Pt Relationship to Sub:      Insurance ID:      Group Name:  Preauthorization #:    Group #:  Preauthorization Days:         01/11/2019 11:46 AM

## 2019-01-11 NOTE — Plan of Care (Signed)
Pt alert and oriented. Still on 1L O2 With Sats in the upper 90s. Lungs clear, diminished at bases. No distress noted this shift.   Problem: Compromised Hemodynamic Status  Goal: Vital signs and fluid balance maintained/improved  Outcome: Progressing  Flowsheets (Taken 01/09/2019 2042 by Octavio Manns, RN)  Vital signs and fluid balance are maintained/improved: Position patient for maximum circulation/cardiac output;Monitor/assess vitals and hemodynamic parameters with position changes;Monitor/assess lab values and report abnormal values;Monitor intake and output. Notify LIP if urine output is less than 30 mL/hour.     Problem: Inadequate Gas Exchange  Goal: Adequate oxygenation and improved ventilation  Outcome: Progressing  Flowsheets (Taken 01/09/2019 2042 by Octavio Manns, RN)  Adequate oxygenation and improved ventilation: Assess lung sounds;Teach/reinforce use of incentive spirometer 10 times per hour while awake, cough and deep breath as needed;Increase activity as tolerated/progressive mobility;Plan activities to conserve energy: plan rest periods

## 2019-01-11 NOTE — Progress Notes (Signed)
Infectious Diseases & Tropical Medicine  Progress Note    01/11/2019   [name],[name] is a 48 y.o. female,       Assessment:      Multifocal pneumonia   COPD exacerbation   Sputum culture no growth to date   Chest x-ray-stable cardiomegaly/bilateral interstitial prominence (01/07/2019)   Procalcitonin level-0.02 (01/02/2019)   Procalcitonin level-0.02 (01/08/2019)   History of heavy smoking   Leukocytosis-on steroids   Morbid obesity   Clinically improved    Plan:      Okay to discharge from infectious disease point of view   Continue doxycycline x3 more days   Continue probiotics   Tapering dose of steroids   Discussed with Dr.Baek    ROS:     General:  no fever, no chills, no rigor, comfortable, no new issues  HEENT: no neck pain, no throat pain  Endocrine:  no fatigue, no night sweats  Respiratory: Cough and shortness of breath improving  Cardiovascular: no chest pain   Gastrointestinal: left lower abdominal muscle pain improving,no N/V/D  Genito-Urinary: no dysuria, increased urinary frequency, trouble voiding, or hematuria   Musculoskeletal: no edema, right hip pain improved  Neurological: c/o generalized weakness   Dermatological: no rash, no ulcer    Physical Examination:     Blood pressure 113/70, pulse 70, temperature 97.1 F (36.2 C), temperature source Axillary, resp. rate 19, height 1.753 m (5' 9"), weight 151 kg (332 lb 14.4 oz), SpO2 96 %.     General Appearance: Comfortable, and in no acute distress.   HEENT: Pupils are equal, round, and reactive to light.    Lungs: Decreased breath sounds   Heart:  Regular rate and rhythm   Chest: Symmetric chest wall expansion.    Abdomen: soft ,non tender,no hepatosplenomegaly,good bowel sounds   Neurological: No focal deficit   Extremities: No edema    Laboratory And Diagnostic Studies:     Recent Labs     01/10/19  0933 01/09/19  0319   WBC 16.56* 13.97*   Hgb 12.6 11.8   Hematocrit 40.4 37.2   Platelets 342  363*     Recent Labs     01/10/19  0933 01/09/19  0319   Sodium 140 138   Potassium 4.2 4.4   Chloride 107 105   CO2 23 24   BUN 18.0 17.0   Creatinine 0.7 0.7   Glucose 97 108*   Calcium 9.7 9.2     No results for input(s): AST, ALT, ALKPHOS, PROT, ALB in the last 72 hours.    Current Meds:      Scheduled Meds: PRN Meds:    aspirin, 81 mg, Oral, Daily  doxycycline, 100 mg, Oral, Q12H SCH  enoxaparin, 0.5 mg/kg, Subcutaneous, Daily  fluticasone furoate-vilanterol, 1 puff, Inhalation, QAM  gabapentin, 800 mg, Oral, TID  guaiFENesin, 600 mg, Oral, Q12H SCH  insulin lispro, 1-3 Units, Subcutaneous, QHS  insulin lispro, 1-5 Units, Subcutaneous, TID AC  lactobacillus/streptococcus, 1 capsule, Oral, Daily  nicotine, 1 patch, Transdermal, Daily  oxybutynin, 5 mg, Oral, TID  pantoprazole, 40 mg, Oral, QAM AC  predniSONE, 30 mg, Oral, QAM W/BREAKFAST    Followed by  Derrill Memo ON 01/12/2019] predniSONE, 20 mg, Oral, QAM W/BREAKFAST    Followed by  Derrill Memo ON 01/13/2019] predniSONE, 10 mg, Oral, QAM W/BREAKFAST  risperiDONE, 3 mg, Oral, BID  sertraline, 100 mg, Oral, BID        Continuous Infusions:   acetaminophen, 650 mg, Q4H PRN  Or  acetaminophen, 650 mg, Q4H PRN  albuterol-ipratropium, 3 mL, Q6H PRN  ALPRAZolam, 0.5 mg, BID PRN  alum & mag hydroxide-simethicone, 20 mL, Q4H PRN  benzonatate, 200 mg, TID PRN  dextrose, 15 g of glucose, PRN    And  glucagon (rDNA), 1 mg, PRN    And  dextrose, 125 mL, PRN  diphenhydrAMINE, 25 mg, Q6H PRN  guaiFENesin-dextromethorphan, 5 mL, Q6H PRN  influenza, 0.5 mL, Prior to discharge  naloxone, 0.2 mg, PRN  nitroglycerin, 0.4 mg, Q5 Min PRN  ondansetron, 4 mg, Q6H PRN    Or  ondansetron, 4 mg, Q6H PRN  oxyCODONE-acetaminophen, 2 tablet, Q6H PRN  senna-docusate, 2 tablet, QHS PRN          [name] A. Gustavus Messing, M.D.  01/11/2019  8:51 AM

## 2019-01-11 NOTE — Discharge Summary (Signed)
[facility]      Patient: [name]  Admission Date: 01/01/2019   DOB: 1971-10-08  Discharge Date: 01/11/2019    MRN: [id number]  Discharge Attending:Amarilys Lyles Karmen Stabs     Referring Physician: [name], MD  PCP: [name], MD       DISCHARGE SUMMARY     Discharge Information   Admission Diagnosis:   Acute respiratory failure with hypoxia    Discharge Diagnosis:   Active Hospital Problems    Diagnosis    Acute respiratory failure with hypoxia    Multifocal pneumonia    COPD with acute exacerbation    CAD (coronary artery disease)    Tobacco use    OSA (obstructive sleep apnea)    Anxiety    Overactive bladder        Admission Condition: Guarded  Discharge Condition: Stable  Consultants: Pulmonology/intensivist, infectious disease  Functional Status: Able to ambulate with a walker  Discharged to: Home with supervision, home PT, home OT, home health, home oxygen    Discharge Medications:     Medication List      START taking these medications    albuterol-ipratropium 2.5-0.5(3) mg/3 mL nebulizer  Commonly known as:  DUO-NEB  Take 3 mLs by nebulization every 6 (six) hours as needed (for SOB or Wheezing)     aspirin 81 MG chewable tablet  Chew 1 tablet (81 mg total) by mouth daily  Start taking on:  January 12, 2019     benzonatate 200 MG capsule  Commonly known as:  TESSALON  Take 1 capsule (200 mg total) by mouth 3 (three) times daily as needed for Cough     doxycycline 100 MG capsule  Commonly known as:  MONODOX  Take 1 capsule (100 mg total) by mouth every 12 (twelve) hours for 3 days     fluticasone furoate-vilanterol 200-25 MCG/INH Aepb  Commonly known as:  BREO ELLIPTA  Inhale 1 puff into the lungs every morning     lactobacillus/streptococcus Caps  Take 1 capsule by mouth daily for 15 days  Start taking on:  January 12, 2019     nicotine 14 MG/24HR  Commonly known as:  [place] 1 patch onto the skin daily  Start taking on:  January 12, 2019     predniSONE 10 MG  tablet  Commonly known as:  DELTASONE  Take 2 tablets (20 mg total) by mouth every morning with breakfast for 2 days, THEN 1 tablet (10 mg total) every morning with breakfast for 2 days.  Start taking on:  January 13, 2019        CONTINUE taking these medications    ALPRAZolam 0.5 MG tablet  Commonly known as:  XANAX     atomoxetine 10 MG capsule  Commonly known as:  STRATTERA     gabapentin 800 MG tablet  Commonly known as:  NEURONTIN     indomethacin 25 MG capsule  Commonly known as:  INDOCIN     oxybutynin 5 MG tablet  Commonly known as:  DITROPAN     oxyCODONE-acetaminophen 10-325 MG per tablet  Commonly known as:  PERCOCET     RisperDAL 3 MG tablet  Generic drug:  risperiDONE     Zoloft 100 MG tablet  Generic drug:  sertraline           Where to Get Your Medications      These [facility] to [id number] [place] -  [address] [facility]  [address] [place] [address]    Phone:  [phone]    albuterol-ipratropium 2.5-0.5(3) mg/3 mL nebulizer   aspirin 81 MG chewable tablet   benzonatate 200 MG capsule   doxycycline 100 MG capsule   fluticasone furoate-vilanterol 200-25 MCG/INH Aepb   lactobacillus/streptococcus Caps   nicotine 14 MG/24HR   predniSONE 10 MG tablet             Hospital Course       Hospital Course (9 Days)     48 y.o.femalewith a history of COPD, active tobacco use, CAD/MI, and anxietypresenting with shortness of breath. she was hospitalized for pneumonia 3 months ago in [place].      This time patient was admitted for pneumonia and acute COPD exacerbation.     Patient did have elevated d-dimer, CTA chest negative for PE but concerning for extensive multilobar pneumonia. venous lower extremity doppler negative for DVT. Lactic acid and procalcitonin normal.  Legionella, influenza, respiratory pathogen panel negative.    Echo shows LVEF 60-65%, moderate pulmonary hypertension. Pulmonology and ID consulted.  Status post cefepime and  doxycyline.  Currently on doxycycline only    Status post VQ scan which showed low probability for pulmonary embolism.      #Acute hypoxic respiratory failure   #Multilobar Pneumonia   #Acute COPD exacerbation  -CTA chest negative for PE but concerning for extensive multilobar pneumonia.  -Status post infectious disease evaluation, appreciate recs.  Continue with 3 more days of doxycycline  -Status post pulmonology evaluation, appreciate recs  -Patient's oral prednisone on taper course  -sputum cx showing moderate growth of mixed upper respiratory flora, viral respiratory panel negative, influenza negative, Legionella negative  -Status post VQ scan which showed low probability for pulmonary embolism.  -continue supplemental oxygen, currently  on 1 L. BIPAP qhs   -continue breo, as needed Duonebs   -mucinex, incentive spirometry      #CAD  -EKG shows NSR, nonspecific T wave changes. Serial troponins x 3 negative.   -continue aspirin  -Follow-up as outpatient    #Tobacco use  -advised cessation. Nicotine patch.     #OSA   -on cpap qhs      #Anxiety  -continue Resporal Zoloft and Neurontin    #Overactive bladder  -continue oxybutynin    Procedures/Imaging:   Chest 2 Views    Result Date: 01/01/2019   Patchy bilateral perihilar opacities are nonspecific. Atypical infection cannot be excluded. Clinical correlation and continued follow-up is recommended. [name], MD 01/01/2019 10:01 PM    Ct Angiogram Chest    Result Date: 01/02/2019   1. This is a limited study due to multiple factors including patient motion and suboptimal bolus timing. No large central filling defect within the pulmonary arteries. 2. There is mosaic attenuation and air trapping worsened mid to upper lobe distribution. While this is a nonspecific finding but can be associated with many disease entities and etiologies, it can be seen with occlusive vascular disease such as chronic thromboembolic disease. Jeanella Cara, MD 01/02/2019 3:19  PM    Nm Pulmonary Ventilation And Perfusion (aerosol Or Gas)    Result Date: 01/08/2019   VERY LOW PROBABILITY FOR PULMONARY EMBOLISM. Leonard Downing, MD 01/08/2019 3:48 PM    Xr Chest Ap Portable    Result Date: 01/07/2019   Stable cardiomegaly. Stable bilateral interstitial prominence. Trudie Reed, MD 01/07/2019 9:50 AM    Xr Chest Ap Portable  Result Date: 01/06/2019   Stable diffuse lung disease Elyn Peers, MD 01/06/2019 12:10 PM    Xr Chest Ap Portable    Result Date: 01/03/2019   Pulmonary edema pattern without change which may be seen with CHF or multifocal pneumonia Elyn Peers, MD 01/03/2019 9:46 AM    US Venous Low Extrem Duplx Dopp Comp Bilat    Result Date: 01/03/2019   No ultrasound evidence of DVT in either lower extremity. Graciella Freer, MD 01/03/2019 5:14 PM         Best Practices   Was the patient admitted with either a CHF Exacerbation or Pneumonia?  Pneumonia     Progress Note/Physical Exam at Discharge     Subjective: Today patient reports that she feels much better denies any new complaints    Vitals:    01/10/19 2038 01/10/19 2335 01/11/19 0533 01/11/19 0700   BP: 109/65 100/57 (!) 85/43 113/70   Pulse: 77 67 63 70   Resp: _0 Temp: 97.5 F (36.4 C) 98.3 F (36.8 C) 97.5 F (36.4 C) 97.1 F (36.2 C)   TempSrc: Oral Axillary Oral Axillary   SpO2: 95% 96% 96%    Weight:       Height:               GEN APPEARANCE:in NAD.  Morbidly obese female alert and cooperative.   HEENT: NCAT, EOMI, PERRL, no nasal discharge.  Nasal cannula noted  conjunctivae/corneas clear. Oral mucosa moist.   Neck: Supple without meningismus   CVS: RRR, S1, S2. No murmur   LUNGS: Improving breath sounds with no wheezes, symmetric air entry. On O2.   ABD: BS+, soft, obese, NT, ND, no guarding or rigidity  EXT: No edema; Pulses 2+ and intact  NEURO: AAOx3, CN 2-12 intact.  No focal neurological deficits  MENTAL STATUS: Normal.  Appropriate affect and mood       Diagnostics     Labs/Studies Pending at Discharge:  No    Last Labs   Recent Labs   Lab 01/10/19  0933 01/09/19  0319 01/08/19  0411   WBC 16.56* 13.97* 11.94*   RBC 4.37 4.05 4.22   Hgb 12.6 11.8 12.0   Hematocrit 40.4 37.2 38.5   MCV 92.4 91.9 91.2   Platelets 342 363* 364*       Recent Labs   Lab 01/10/19  0933 01/09/19  0319 01/08/19  0411 01/07/19  0336 01/06/19  0326   Sodium 140 138 137 137 140   Potassium 4.2 4.4 4.8 5.1 4.3   Chloride 107 105 105 106 109   CO2 _1 BUN 18.0 17.0 22.0* 18.0 17.0   Creatinine 0.7 0.7 0.7 0.7 0.7   Glucose 97 108* 106* 113* 88   Calcium 9.7 9.2 10.0 9.8 9.9       Microbiology Results     Procedure Component Value Units Date/Time    CULTURE + Lacretia Leigh [[id number]] Collected:  01/04/19 1601    Specimen:  Sputum, Expectorated Updated:  01/07/19 0654    Narrative:       ORDER#: [id number]                                    ORDERED BY: Gustavus Messing, IMTI  SOURCE: Sputum, Expectorated Sputum  COLLECTED:  01/04/19 16:01  ANTIBIOTICS AT COLL.:                                RECEIVED :  01/05/19 00:21  Stain, Gram (Respiratory)                  FINAL       01/05/19 01:57  01/05/19   Few WBC's             Few Squamous epithelial cells             Few Mixed Respiratory Flora  Culture and Gram Stain, Aerobic, RespiratorFINAL       01/07/19 06:54  01/06/19   Moderate growth of mixed upper respiratory flora  01/07/19   NO Pseudomonas aeruginosa             NO Staph aureus             NO MRSA      Legionella antigen, urine [[id number]] Collected:  01/02/19 0120    Specimen:  Urine, Clean Catch Updated:  01/02/19 1146    Narrative:       ORDER#: [id number]                                    ORDERED BY: Gloris Manchester  SOURCE: Urine, Clean Catch                           COLLECTED:  01/02/19 01:20  ANTIBIOTICS AT COLL.:                                RECEIVED :  01/02/19 04:11  Legionella, Rapid Urinary Antigen          FINAL       01/02/19 11:46  01/02/19   Negative for Legionella pneumophila  Serogroup 1 Antigen             Limitations of Test:             1. Negative results do not exclude infection with Legionella                pneumophila Serogroup 1.             2. Does not detect other serogroups of L. pneumophila                or other Legionella species.             Test Reference Range: Negative      Rapid influenza A/B antigens [[id number]] Collected:  01/02/19 0104    Specimen:  Nasopharyngeal from Nasal Aspirate Updated:  01/02/19 0138    Narrative:       ORDER#: [id number]                                    ORDERED BY: Gloris Manchester  SOURCE: Nasal Aspirate                               COLLECTED:  01/02/19 01:04  ANTIBIOTICS AT COLL.:  RECEIVED :  01/02/19 01:19  Influenza Rapid Antigen A&B                FINAL       01/02/19 01:38  01/02/19   Negative for Influenza A and B             Reference Range: Negative      Respiratory Pathogen Panel, PCR (Film Array) [[id number]] Collected:  01/02/19 0104     Updated:  01/02/19 0644     Adenovirus Not Detected     Coronavirus 229E Not Detected     Coronavirus HKU1 Not Detected     Coronavirus NL63 Not Detected     Coronavirus OC43 Not Detected     Human Metapneumovirus Not Detected     Human Rhinovirus/Enterovirus Not Detected     Influenza A Not Detected     Influenza A/H1 Not Detected     Influenza AH1 - 2009 Not Detected     Influenza A/H3 Not Detected     Influenza B Not Detected     Parainfluenza Virus 1 Not Detected     Parainfluenza Virus 2 Not Detected     Parainfluenza Virus 3 Not Detected     Parainfluenza Virus 4 Not Detected     Respiratory Syncytial Virus Not Detected     Bordetella pertussis Not Detected     Chlamydophila pneumoniae Not Detected     Mycoplasma pneumoniae Not Detected     Comment: Multiplex nucleic acid amplification assay for detection of  18 respiratory viruses and bacteria. This assay cannot  differentiate Rhinovirus/Enterovirus. If necessary for  patient care, a positive result for  Rhinovirus/Enterovirus  may be followed-up using an alternate method.  Viral and bacterial nucleic acids may persist even though no  viable organism is present. Detection of nucleic acid does  not imply that the corresponding organisms are infectious or  are the causative agents of clinical symptoms. A negative  result does not exclude the possibility of viral or  bacterial infection. Performance characteristics may vary  with circulating strains. The assay may not be able to  distinguish between existing viral strains and new variants  as they emerge.The performance of this test has not been  established in individuals who received influenza vaccine.  Recent administration of a nasal influenza vaccine may cause  false positive results for Influenza A and/or Influenza B.  This assay is FDA cleared for nasopharyngeal swab samples.  Performance characteristics for Bronchoalveolar lavage  samples have been determined by the [facility] laboratory. Other  sample types are unacceptable.          Source NP Swab           Patient Instructions   Discharge Diet: Cardiac  Discharge Activity: As tolerated    Follow Up Appointment:  Follow-up Information     [name], MD. Schedule an appointment as soon as possible for a visit in 1 week(s).    Specialty:  Internal Medicine  Contact information:  [address]  [place] [address]  [phone]                    Time spent examining patient, discussing with patient/family regarding hospital course, chart review, reconciling medications and discharge planning: > 35 minutes.    Dorann Lodge    12:35 PM 01/11/2019

## 2019-01-11 NOTE — Progress Notes (Signed)
OOB with minimal assist. Mild shortness of breath, wheezing noted with exertion. o2 in used. o2 saturation 96% on 1 liter of oxygen. Good appetite. Had 1 BM today. Tolerated percocet given for c/o back pain.   Pt is ready to be discharged. IV line and telemetry monitor removed. No bleeding noted from iv site. Dry gauzes applied over iv site. VSS. Cadiac tele monitor removed and returned to tele-room. Discharge instructions given. [facility] [place] [place]  Encourage to ask questions and all concerns are addressed. Pt has no further inquiries. Verballized understanding of discharge instructions. Able to teach back. Pt was discharged  from the unit at 1345 via ambulance with all belongings, accompanied by PTS with oxygen in used. Off the unit to the lobby in no acute distress.

## 2019-01-13 NOTE — Progress Notes (Addendum)
Home Health Referral          Referral from [place], Roanna Epley, RN (Case Manager) for home health care upon discharge.    By [employer], the patient has the right to freely choose a home care provider.  Arrangements have been made with:     A company of the patients choosing. We have supplied the patient with a listing of providers in your area who asked to be included and participate in Medicare.   [facility], a home care agency that provides adult home care services and participates in Medicare   The preferred provider of your insurance company. Choosing a home care provider other than your insurance company's preferred provider may affect your insurance coverage.      Home Health Discharge Information     Your doctor has ordered Skilled Nursing, Physical Therapy and Occupational Therapy in-home service(s) for you while you recuperate at home, to assist you in the transition from hospital to home.      The agency that you or your representative chose to provide the service:  Name of [facility] Placement: Other (comment box)([facility]  )]  [phone]          The above services were set up by:  Madeline Alberts, RN, BSN  [facility] Liaison)   Phone  [phone]      IF YOU HAVE NOT HEARD [place] 24-48 HOURS AFTER DISCHARGE PLEASE CALL YOUR AGENCY TO ARRANGE A TIME FOR YOUR FIRST VISIT. FOR ANY SCHEDULING CONCERNS OR QUESTIONS RELATED TO HOME HEALTH, SUCH AS TIME OR DATE PLEASE CONTACT YOUR HOME HEALTH AGENCY AT THE NUMBER LISTED ABOVE.    HOME HEALTH REFERRAL    PATIENT"S DEMOGRAPHICS:        Name: [name]    Discharge Address:   [address]  [address] [place] [address]      Primary Telephone Number:  [phone]  Secondary Telephone Number: [phone]  Emergency Contact and Number:   Extended Emergency Contact Information  Primary Emergency Contact: [name]   Mobile Phone: [phone]  Relation: Mother    Ordering Physician:   Madeline Landry, MD    Following Physician:   Madeline Hartmann, MD    PCP: Madeline Hartmann, MD, [phone]    Agreeable to Follow: Yes      Language/Communication Barrier:    No    Primary Diagnosis and Reason for Services:    Acute respiratory failure with hypoxia   Multifocal pneumonia  COPD with acute exacerbation  CAD (coronary artery disease)  Tobacco use  OSA (obstructive sleep apnea)  Anxiety  Overactive bladder     [employer] (Labs, Wounds, Infusions, etc.):    Home Health face-to-face (FTF) Encounter (Order [id number])   Consult   Date: 01/11/2019 Department: [facility] Ordering/Authorizing: Madeline Landry, MD   Order Information     Order Date/Time Release Date/Time Start Date/Time End Date/Time   01/11/19 11:44 AM None 01/11/19 11:45 AM 01/11/19 11:45 AM   Order Details     Frequency Duration Priority Order Class   Once 1 occurrence Routine Hospital Performed   Standing Order Information     Remaining Occurrences Interval Last Released      0/1 Once 01/11/2019            Provider Information  Ordering User Ordering Provider Authorizing Provider   Madeline Righter, RN Madeline Landry, MD Madeline Landry, MD   Attending Provider(s) Admitting Provider PCP   Madeline Guiles, MD; Madeline Gouty, MD; Madeline Mocha, MD; Madeline Landry, MD Madeline Gouty, MD Madeline Hartmann, MD   Verbal Order Info     Action Created on Order Mode Entered by Responsible Provider Signed by Signed on   Ordering 01/11/19 1144 Telephone with readback Madeline Righter, RN Madeline Landry, MD Madeline Landry, MD 01/11/19 1220   Order Questions     Question Answer Comment   Date of face-to-face (FTF) encounter: 01/11/2019    Medical conditions that necessitate Home Health care: B. Functional impairment due to recent hospitalization/procedure/treatment     C. Risk for complication/infection/pain requiring follow up and monitoring      D. Chronic illness & risk for re-hospitalization due to unstable disease status     E. Exacerbation of disease requiring follow up monitoring     F. New diagnosis & treatment requiring follow up monitoring and management    Clinical findings that support the need for Skilled Nursing. SN will: C. Monitor for signs and symptoms of exacerbation of disease and management     D. Review medication reconciliation, manage and educate on use and side effects     G. Educate on new diagnosis, treatment & management to prevent re-hospitalization     H. Assess cardiopulmonary status and monitor for signs &symptoms of exacerbation     N. Instruct on oxygen use and safety    Clinical findings that support the need for Physical Therapy. PT will A. Evaluate and treat functional impairment and improve mobility     C. Educate on weight bearing status, stair/gait training, balance & coordination     D. Provide services to help restore function, mobility, and releive pain     E. Educate on functional mobility; bed, chair, sit, stand and transfer activities     F. Perform home safety assessment & develop safe in home exercise program     G. Implement activities to improve stance time, cadence & step length     I. Instruct on restorative activities to restore ability to perform ADL    Clinical findings support the need for OT (needs SN/PT order).OT will A. Develop in home program to improve ability to perform ADLs     B. Develop restorative program to improve mobility and independence     C. Educate on recovery and maintenance skills     E. Educate on basic motor function and reasoning abilities    Clinical findings that support the need for SLP. ST will F. N/A    Per clinical findings, following services are medically necessary: Skilled Nursing     PT     OT    Evidence this patient is homebound because: B. Profound weakness, poor balance/unsteady gait d/t illness/treatment/procedure    Other (please specify) Dr.  Jodi [name] to follow in community          Process Instructions     Please select [place] medically necessary.     Based on the above findings, I certify that this patient is confined to the home and needs intermittent skilled nursing care, physical therapry and / or speech therapy or continues to need occupational therapy. The patient is under my care, and I have initiated the establishment of the plan of care. This patient will be followed  by a physician who will periodically review the plan of care.    Collection Information     Consult Order Info     ID Description Priority Start Date Start Time   [id number] [facility] face-to-face (FTF) Encounter Routine 01/11/2019 11:45 AM   Provider Specialty Referred to   ______________________________________ _____________________________________   Acknowledgement Info     For At Acknowledged By Acknowledged On   Placing Order 01/11/19 [place] Madeline Righter, RN 01/11/19 1144   Verbal Order Info     Action Created on Order Mode Entered by Responsible Provider Signed by Signed on   Ordering 01/11/19 1144 Telephone with Madeline Floro, RN Madeline Landry, MD Madeline Landry, MD 01/11/19 1220   Patient Information     Patient Name  [name], [name] Sex  Female DOB  Apr 19, 1971   Additional Information     Associated Reports External References   Priority and Order Details [facility]         Additional Comments:  [place]        Patient Name: [name], [name]     MRN: [id number]     CSN: [id number]       [place] #   [id number] Patient Class   Inpatient Service  Medicine Accommodation Code  Intermediate Care     Admission Information    Admitting Physician:  Attending Physician: Madeline Gouty, MD   Unit  AX 25NO INTERME* L&D Status     Admitting Diagnosis: SOB (shortness of breath); History of COPD; SOB (shortness of breath); SOB* Room / Bed  A2515/A2515-01  L&D  Last Menstrual Cycle     Chief Complaint: Shortness of Breath     Admit Type:  Admit Date/Time:  Discharge Date/Time: Emergency  01/01/2019 / 2001  01/11/2019 / 1346 Length of Stay: 9 Days   L&D EDD   Estimated Date of Delivery: None noted.     Patient Information            Home Address: [address]  [place] [address] Employer:  Employer Address:     ,     Main Phone: [phone] Employer Phone:    SSN: [id number]     DOB: 01-29-1971 (56 yrs)     Sex: Female Primary Care Physician: Madeline Hartmann, MD   Marital Status: Single Referring Physician:       No ref. provider found   Race: White or Caucasian     Ethnicity: Non Hispanic/Latino     Emergency Contacts  Name Home Phone Work Phone Mobile Phone Relationship [name]   [place], [place] Mother         Guarantor Information    Guarantor Name: [name], HEMRICK ID: [id number]   Guarantor Relationship to Pt: Self Guarantor Type: Personal/Family   Guarantor DOB:   21-Jun-1971     Guarantor Address: [address] 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  [place], [place]  Subscriber DOB: 30-Aug-1971     Subscriber ID: [id number]   Insurance Phone: [phone] Pt Relationship to Sub:   Self   Insurance ID:      Group Name:  Preauthorization #:    Group #: [id number] Preauthorization Days:                                                    01/13/2019 9:50 AM         Discharge Date: 01/11/2019  Referral Source (PACC/Hospital/Unit):   Darci Current, RN, BSN.  [facility]  [place]  [phone]    Referral Date: 01/13/19

## 2019-01-13 NOTE — UM Notes (Signed)
Requesting final auth to be faxed to [phone] and additional information can be requested by calling   [phone]    Auth Number :[id number]  Discharge Date -  01/11/2019  1:46 PM    ER ADMIT DATE AND TIME: 01/01/2019  8:01 PM  OBS ADMIT DATE: 01/01/19  IP ADMIT DATE: 01/02/19    NAME: [name]             MR#: [id number]      Patient Address:  [address]  [address] [place] [address]    Patient phone: [phone] (home)     PATIENT NAME: [name], [name]  DOB: 05/16/1971   PMH:  has a past medical history of Anxiety, Chronic obstructive pulmonary disease, Myocardial infarction, and Panic attacks.  PSH:  has a past surgical history that includes Cholecystectomy; Appendectomy; and ARTHROSCOPIC KNEE, ACL RECONSTRUCTION.      DIAGNOSIS:     ICD-10-CM    1. SOB (shortness of breath) R06.02    2. History of COPD Z87.09

## 2019-01-31 ENCOUNTER — Inpatient Hospital Stay
Admission: EM | Admit: 2019-01-31 | Discharge: 2019-02-04 | DRG: 139 | Disposition: A | Discharge status: Home / Self Care | Payer: Medicaid Other | Attending: Geriatric Medicine | Admitting: Geriatric Medicine

## 2019-01-31 ENCOUNTER — Emergency Department: Payer: Medicaid Other

## 2019-01-31 DIAGNOSIS — I252 Old myocardial infarction: Secondary | ICD-10-CM

## 2019-01-31 DIAGNOSIS — G4733 Obstructive sleep apnea (adult) (pediatric): Secondary | ICD-10-CM | POA: Diagnosis present

## 2019-01-31 DIAGNOSIS — R079 Chest pain, unspecified: Secondary | ICD-10-CM

## 2019-01-31 DIAGNOSIS — J189 Pneumonia, unspecified organism: Principal | ICD-10-CM | POA: Diagnosis present

## 2019-01-31 DIAGNOSIS — M940 Chondrocostal junction syndrome [Tietze]: Secondary | ICD-10-CM | POA: Diagnosis present

## 2019-01-31 DIAGNOSIS — I2510 Atherosclerotic heart disease of native coronary artery without angina pectoris: Secondary | ICD-10-CM | POA: Diagnosis present

## 2019-01-31 DIAGNOSIS — Z9981 Dependence on supplemental oxygen: Secondary | ICD-10-CM

## 2019-01-31 DIAGNOSIS — F17210 Nicotine dependence, cigarettes, uncomplicated: Secondary | ICD-10-CM | POA: Diagnosis present

## 2019-01-31 DIAGNOSIS — J441 Chronic obstructive pulmonary disease with (acute) exacerbation: Secondary | ICD-10-CM | POA: Diagnosis present

## 2019-01-31 DIAGNOSIS — F411 Generalized anxiety disorder: Secondary | ICD-10-CM | POA: Diagnosis present

## 2019-01-31 DIAGNOSIS — J9610 Chronic respiratory failure, unspecified whether with hypoxia or hypercapnia: Secondary | ICD-10-CM | POA: Diagnosis present

## 2019-01-31 DIAGNOSIS — K219 Gastro-esophageal reflux disease without esophagitis: Secondary | ICD-10-CM | POA: Diagnosis present

## 2019-01-31 DIAGNOSIS — F410 Panic disorder [episodic paroxysmal anxiety] without agoraphobia: Secondary | ICD-10-CM | POA: Diagnosis present

## 2019-01-31 DIAGNOSIS — Z7982 Long term (current) use of aspirin: Secondary | ICD-10-CM

## 2019-01-31 DIAGNOSIS — J440 Chronic obstructive pulmonary disease with acute lower respiratory infection: Secondary | ICD-10-CM | POA: Diagnosis present

## 2019-01-31 LAB — CBC AND DIFFERENTIAL
Absolute NRBC: 0 10*3/uL (ref 0.00–0.00)
Basophils Absolute Automated: 0.07 10*3/uL (ref 0.00–0.08)
Basophils Automated: 0.9 %
Eosinophils Absolute Automated: 1.08 10*3/uL — ABNORMAL HIGH (ref 0.00–0.44)
Eosinophils Automated: 14.6 %
Hematocrit: 38 % (ref 34.7–43.7)
Hgb: 12.2 g/dL (ref 11.4–14.8)
Immature Granulocytes Absolute: 0.03 10*3/uL (ref 0.00–0.07)
Immature Granulocytes: 0.4 %
Lymphocytes Absolute Automated: 2.5 10*3/uL (ref 0.42–3.22)
Lymphocytes Automated: 33.8 %
MCH: 28.8 pg (ref 25.1–33.5)
MCHC: 32.1 g/dL (ref 31.5–35.8)
MCV: 89.8 fL (ref 78.0–96.0)
MPV: 10.4 fL (ref 8.9–12.5)
Monocytes Absolute Automated: 0.49 10*3/uL (ref 0.21–0.85)
Monocytes: 6.6 %
Neutrophils Absolute: 3.23 10*3/uL (ref 1.10–6.33)
Neutrophils: 43.7 %
Nucleated RBC: 0 /100 WBC (ref 0.0–0.0)
Platelets: 222 10*3/uL (ref 142–346)
RBC: 4.23 10*6/uL (ref 3.90–5.10)
RDW: 14 % (ref 11–15)
WBC: 7.4 10*3/uL (ref 3.10–9.50)

## 2019-01-31 LAB — COMPREHENSIVE METABOLIC PANEL
ALT: 10 U/L (ref 0–55)
AST (SGOT): 10 U/L (ref 5–34)
Albumin/Globulin Ratio: 1.1 (ref 0.9–2.2)
Albumin: 3 g/dL — ABNORMAL LOW (ref 3.5–5.0)
Alkaline Phosphatase: 95 U/L (ref 37–106)
Anion Gap: 7 (ref 5.0–15.0)
BUN: 8 mg/dL (ref 7.0–19.0)
Bilirubin, Total: 0.3 mg/dL (ref 0.2–1.2)
CO2: 23 mEq/L (ref 22–29)
Calcium: 9.1 mg/dL (ref 8.5–10.5)
Chloride: 109 mEq/L (ref 100–111)
Creatinine: 0.8 mg/dL (ref 0.6–1.0)
Globulin: 2.7 g/dL (ref 2.0–3.6)
Glucose: 80 mg/dL (ref 70–100)
Potassium: 4.2 mEq/L (ref 3.5–5.1)
Protein, Total: 5.7 g/dL — ABNORMAL LOW (ref 6.0–8.3)
Sodium: 139 mEq/L (ref 136–145)

## 2019-01-31 LAB — B-TYPE NATRIURETIC PEPTIDE: B-Natriuretic Peptide: 28 pg/mL (ref 0–100)

## 2019-01-31 LAB — TROPONIN I: Troponin I: <0.01 ng/mL (ref 0.00–0.05)

## 2019-01-31 LAB — PT AND APTT
PT INR: 0.9 (ref 0.9–1.1)
PT: 12.5 s — ABNORMAL LOW (ref 12.6–15.0)
PTT: 26 s (ref 23–37)

## 2019-01-31 LAB — HCG QUANTITATIVE: hCG, Quant.: <1.2

## 2019-01-31 LAB — GFR: EGFR: >60

## 2019-01-31 LAB — HEMOLYSIS INDEX: Hemolysis Index: 5 (ref 0–18)

## 2019-01-31 MED ORDER — ASPIRIN 325 MG PO TABS
325.0000 mg | ORAL_TABLET | Freq: Once | ORAL | Status: AC
Start: 2019-01-31 — End: 2019-01-31
  Administered 2019-01-31: 23:00:00 325 mg via ORAL
  Filled 2019-01-31: qty 1

## 2019-01-31 MED ORDER — ALUM & MAG HYDROXIDE-SIMETH 200-200-20 MG/5ML PO SUSP
30.00 mL | Freq: Once | ORAL | Status: AC
Start: 2019-01-31 — End: 2019-01-31
  Administered 2019-01-31: 30 mL via ORAL
  Filled 2019-01-31: qty 30

## 2019-01-31 MED ORDER — MAGNESIUM SULFATE IN D5W 1-5 GM/100ML-% IV SOLN
1.00 g | Freq: Once | INTRAVENOUS | Status: AC
Start: 2019-01-31 — End: 2019-01-31
  Administered 2019-01-31: 23:00:00 1 g via INTRAVENOUS
  Filled 2019-01-31: qty 100

## 2019-01-31 MED ORDER — LIDOCAINE VISCOUS HCL 2 % MT SOLN
10.00 mL | Freq: Once | OROMUCOSAL | Status: AC
Start: 2019-01-31 — End: 2019-01-31
  Administered 2019-01-31: 10 mL via OROMUCOSAL
  Filled 2019-01-31: qty 15

## 2019-01-31 MED ORDER — ALBUTEROL-IPRATROPIUM 2.5-0.5 (3) MG/3ML IN SOLN
3.00 mL | Freq: Once | RESPIRATORY_TRACT | Status: AC
Start: 2019-01-31 — End: 2019-01-31
  Administered 2019-01-31: 22:00:00 3 mL via RESPIRATORY_TRACT
  Filled 2019-01-31: qty 3

## 2019-01-31 MED ORDER — METHYLPREDNISOLONE SODIUM SUCC 125 MG IJ SOLR
60.00 mg | Freq: Once | INTRAMUSCULAR | Status: AC
Start: 2019-01-31 — End: 2019-01-31
  Administered 2019-01-31: 23:00:00 60 mg via INTRAVENOUS
  Filled 2019-01-31: qty 2

## 2019-01-31 MED ORDER — FENTANYL CITRATE (PF) 50 MCG/ML IJ SOLN (WRAP)
50.00 ug | Freq: Once | INTRAMUSCULAR | Status: AC
Start: 2019-01-31 — End: 2019-01-31
  Administered 2019-01-31: 23:00:00 50 ug via INTRAVENOUS
  Filled 2019-01-31: qty 2

## 2019-01-31 NOTE — ED Notes (Signed)
Bed: GR12  Expected date:   Expected time:   Means of arrival:   Comments:  M, 823

## 2019-01-31 NOTE — ED Triage Notes (Signed)
Pt BIBA, had PNA 2 weeks ago, left chest still junky per EMS. L. Leg swelling. SOB with exertion and chest pain when coughs.

## 2019-01-31 NOTE — ED Provider Notes (Signed)
EMERGENCY DEPARTMENT HISTORY AND PHYSICAL EXAM     Physician/Midlevel provider first contact with patient: 01/31/19 2122         Date: 01/31/2019  Patient Name: [name]  Attending Physician: Ellis Savage, MD  Advanced Practice Provider: Harlene Salts    History of Presenting Illness       History Provided By: Patient    Chief Complaint:  Chief Complaint   Patient presents with    Shortness of Breath    Cough     Onset: Acute on chronic worse today  Timing: Worse today  Location: Respiratory  Quality: Shortness of breath dyspnea on exertion chest pain  Severity: Severe  Exacerbating factors: None  Alleviating factors: None  Associated Symptoms: see ROS  Pertinent Negatives: see ROS    Additional History: [name] is a 48 y.o. female presenting to the ED with history of MI coronary artery disease COPD recently quit smoking and discharged in the hospital with multifocal pneumonia last month presents with shortness of breath dyspnea on exertion and chest pain.  Patient states that she also had leg swelling.  She went to her doctors today she was having shortness of breath.  She supposed to be on 2 L of oxygen at home chronically for her chronic respiratory failure from her COPD however she does not have it with her at this time    PCP: Virgel Bouquet, MD  SPECIALISTS:     Matalyn, Nawaz   Home Medication Instructions HTX:[id number]    Printed on:02/01/19 0439   Medication Information 08 AM 10 AM 12 Noon 06 PM 08 PM 10 PM Bed time             albuterol-ipratropium (DUO-NEB) 2.5-0.5(3) mg/3 mL nebulizer  Take 3 mLs by nebulization every 6 (six) hours as needed (for SOB or Wheezing)            ALPRAZolam (XANAX) 0.5 MG tablet  Take 0.5 mg by mouth 2 (two) times daily as needed               aspirin 81 MG chewable tablet  Chew 1 tablet (81 mg total) by mouth daily            atomoxetine (STRATTERA) 10 MG capsule  Take 80 mg by mouth daily               benzonatate (TESSALON) 200 MG  capsule  Take 1 capsule (200 mg total) by mouth 3 (three) times daily as needed for Cough            fluticasone furoate-vilanterol (BREO ELLIPTA) 200-25 MCG/INH Aerosol Powder, Breath Activtivatede  Inhale 1 puff into the lungs every morning            gabapentin (NEURONTIN) 800 MG tablet  Take 800 mg by mouth 3 (three) times daily            indomethacin (INDOCIN) 25 MG capsule  Take 50 mg by mouth 3 (three) times daily with meals            nicotine (NICODERM CQ) 14 MG/24HR  Place 1 patch onto the skin daily            oxybutynin (DITROPAN) 5 MG tablet  Take 5 mg by mouth 3 (three) times daily            oxyCODONE-acetaminophen (PERCOCET) 10-325 MG per tablet  Take 1 tablet by mouth every 4 (four) hours as needed for  Pain            risperiDONE (RISPERDAL) 3 MG tablet  Take 3 mg by mouth 2 (two) times daily            sertraline (ZOLOFT) 100 MG tablet  Take 100 mg by mouth 2 (two) times daily                   Past History     Past Medical History:  Past Medical History:   Diagnosis Date    Anxiety     Chronic obstructive pulmonary disease     Myocardial infarction     Panic attacks        Past Surgical History:  Past Surgical History:   Procedure Laterality Date    APPENDECTOMY      ARTHROSCOPIC KNEE, ACL RECONSTRUCTION      R. knee     CHOLECYSTECTOMY         Family History:  Family History   Problem Relation Age of Onset    Heart attack Father     Diabetes Father     Asthma Sister     Diabetes Maternal Grandmother     Diabetes Paternal Grandmother     Heart disease Paternal Grandmother        Social History:  Social History     Socioeconomic History    Marital status: Single     Spouse name: None    Number of children: None    Years of education: None    Highest education level: None   Occupational History    None   Social Transport planner strain: None    Food insecurity:     Worry: None     Inability: None    Transportation needs:     Medical: None     Non-medical: None    Tobacco Use    Smoking status: Former Smoker     Packs/day: 1.00     Years: 25.00     Pack years: 25.00     Types: Cigarettes     Last attempt to quit: 12/29/2018     Years since quitting: 0.0    Smokeless tobacco: Never Used   Substance and Sexual Activity    Alcohol use: Never     Frequency: Never    Drug use: Never    Sexual activity: None   Lifestyle    Physical activity:     Days per week: None     Minutes per session: None    Stress: None   Relationships    Social connections:     Talks on phone: None     Gets together: None     Attends religious service: None     Active member of club or organization: None     Attends meetings of clubs or organizations: None     Relationship status: None    Intimate partner violence:     Fear of current or ex partner: None     Emotionally abused: None     Physically abused: None     Forced sexual activity: None   Other Topics Concern    None   Social History Narrative    None       Allergies:  Allergies   Allergen Reactions    Iodine Solution [Povidone Iodine]      Contrast dye        Review of Systems     General:  No fever, no sweats, no chills.   HENT: No headache, no nasal congestion, no sore throat  Respiratory: Positive cough, positive shortness of breath.  Cardiovascular: Positive chest pain, no calf pain or swelling.   Gastrointestinal: No abdominal pain, no nausea, no vomiting, no diarrhea.   Genito-Urinary: No dysuria, no frequency, no hematuria  Musculoskeletal: No myalgias, no joint pains.   Neurological: No new focal weakness, no sensory changes.   Dermatological: No new rashes, no color changes.   Psychological: No acute mood changes, no confusion.     Review of Systems    Physical Exam     Vitals:    02/01/19 0000 02/01/19 0135 02/01/19 0301 02/01/19 0432   BP: 104/55 126/65 105/53 128/81   Pulse: 72 (!) 57 (!) 50 67   Resp:  (!) _0 Temp:   98.7 F (37.1 C) 97.9 F (36.6 C)   TempSrc:   Oral Oral   SpO2: 94% 95% 95% 92%   Weight:    152.9  kg   Height:    _1  (1.753 m)         Constitutional: Vital signs reviewed.  Chronically ill-appearing in mild respiratory distress  Head: Normocephalic, atraumatic. No external trauma noted.  Eyes: Conjunctiva and sclera are normal. Extraocular movements intact, pupils equal, round, reactive.  Ear, Nose, Throat:  Normal external examination of the nose and ears. Oropharynx clear, moist mucous membranes.    Neck: Supple.    Respiratory/Chest: Decreased bilaterally due to body habitus with faint rhonchi wheeze cardiovascular: Regular rate. Regular rhythm. S1, S2.   Abdomen: Normoactive Bowel sounds. Soft. Non-tender to palpation. No guarding or rebound.  Back: No midline tenderness, no CVA tenderness.   Extremities: Upper and lower extremities with no cyanosis or edema. No calf tenderness. Normal +2 pulses in all extremities.  Skin: Warm and dry. No rash.  Neuro: alert and appropriate, normal speech, no facial droop, moving all extremities.       Physical Exam      Diagnostic Study Results     Labs -     Results     Procedure Component Value Units Date/Time    B-type Natriuretic Peptide [[id number]] Collected:  01/31/19 2242    Specimen:  Blood Updated:  01/31/19 2342     B-Natriuretic Peptide 28 pg/mL     Troponin I [[id number]] Collected:  01/31/19 2242    Specimen:  Blood Updated:  01/31/19 2341     Troponin I <0.01 ng/mL     Beta HCG, Quant, Serum [[id number]] Collected:  01/31/19 2242     Updated:  01/31/19 2341     hCG, America Brown. <1.2    Comprehensive metabolic panel [[id number]]  (Abnormal) Collected:  01/31/19 2242    Specimen:  Blood Updated:  01/31/19 2340     Glucose 80 mg/dL      BUN 8.0 mg/dL      Creatinine 0.8 mg/dL      Sodium 139 mEq/L      Potassium 4.2 mEq/L      Chloride 109 mEq/L      CO2 23 mEq/L      Calcium 9.1 mg/dL      Protein, Total 5.7 g/dL      Albumin 3.0 g/dL      AST (SGOT) 10 U/L      ALT 10 U/L      Alkaline Phosphatase 95 U/L      Bilirubin, Total 0.3 mg/dL  Globulin 2.7 g/dL       Albumin/Globulin Ratio 1.1     Anion Gap 7.0    Hemolysis index [[id number]] Collected:  01/31/19 2242     Updated:  01/31/19 2340     Hemolysis Index 5    GFR [[id number]] Collected:  01/31/19 2242     Updated:  01/31/19 2340     EGFR >60.0    Rapid influenza A/B antigens [[id number]] Collected:  01/31/19 2242    Specimen:  Nasopharyngeal from Nasal Aspirate Updated:  01/31/19 2333    Narrative:       ORDER#: [id number]                                    ORDERED BY: Vista Lawman  SOURCE: Nasal Aspirate                               COLLECTED:  01/31/19 22:42  ANTIBIOTICS AT COLL.:                                RECEIVED :  01/31/19 23:11  Influenza Rapid Antigen A&B                FINAL       01/31/19 23:33  01/31/19   Negative for Influenza A and B             Reference Range: Negative      PT/APTT [[id number]]  (Abnormal) Collected:  01/31/19 2242     Updated:  01/31/19 2324     PT 12.5 sec      PT INR 0.9     PTT 26 sec     CBC and differential [[id number]]  (Abnormal) Collected:  01/31/19 2242    Specimen:  Blood Updated:  01/31/19 2317     WBC 7.40 x10 3/uL      Hgb 12.2 g/dL      Hematocrit 38.0 %      Platelets 222 x10 3/uL      RBC 4.23 x10 6/uL      MCV 89.8 fL      MCH 28.8 pg      MCHC 32.1 g/dL      RDW 14 %      MPV 10.4 fL      Neutrophils 43.7 %      Lymphocytes Automated 33.8 %      Monocytes 6.6 %      Eosinophils Automated 14.6 %      Basophils Automated 0.9 %      Immature Granulocyte 0.4 %      Nucleated RBC 0.0 /100 WBC      Neutrophils Absolute 3.23 x10 3/uL      Abs Lymph Automated 2.50 x10 3/uL      Abs Mono Automated 0.49 x10 3/uL      Abs Eos Automated 1.08 x10 3/uL      Absolute Baso Automated 0.07 x10 3/uL      Absolute Immature Granulocyte 0.03 x10 3/uL      Absolute NRBC 0.00 x10 3/uL     Blood Culture Aerobic/Anaerobic #1 [[id number]] Collected:  01/31/19 2242    Specimen:  Arm from Blood, Venipuncture Updated:  01/31/19 2244    Narrative:       1 BLUE+1 PURPLE  Blood Culture  Aerobic/Anaerobic #2 [[id number]] Collected:  01/31/19 2242    Specimen:  Arm from Blood, Venipuncture Updated:  01/31/19 2244    Narrative:       1 BLUE+1 PURPLE          Radiologic Studies -   Radiology Results (24 Hour)     Procedure Component Value Units Date/Time    Hip Right AP and Lateral with Pelvis [[id number]] Collected:  02/01/19 0307    Order Status:  Completed Updated:  02/01/19 0313    Narrative:       Examination: Pelvis right hip 4 views.    HISTORY: Pain.    COMPARISON: None.    FINDINGS:  Moderate right, mild left osteoarthritic changes femoral acetabular  joints.  Contrast in bladder.  No acute fracture or dislocation.  No suspicious radiodensities.      Impression:         Right more severe than left hip osteoarthrosis.    Guy Sandifer, MD   02/01/2019 3:08 AM    CT Angio Chest (PE study) [[id number]] Collected:  02/01/19 0113    Order Status:  Completed Updated:  02/01/19 0122    Narrative:       History: Pain.    Comparison: 01/02/2019.    Technique: Axial CTA chest with 115 cc Omnipaque 350 intravenous  contrast. 3-D multiplanar MIP reconstructions.    Findings:  Pulmonary arteries: Adequate contrast opacification. No pulmonary  embolism.        Heart: Normal size. No pericardial effusion. No evidence of right heart  strain.    Aorta: Normal caliber and configuration.    Lungs: Apical centrilobular emphysematous changes. Diffuse heterogeneous  groundglass attenuation otherwise markedly improved from prior  examination. No discrete consolidation or suspicious infiltrates.    Pleura: No thickening or collections.    Airways: Patent.    Thyroid: Unremarkable.    Lymph nodes: Unremarkable.    Upper abdomen: Cholecystectomy.    Bones: No suspicious osseous lesions.      Impression:         No pulmonary embolism.     Improving pneumonitis.    Guy Sandifer, MD   02/01/2019 1:18 AM    Chest AP Portable [[id number]] Collected:  01/31/19 2145    Order Status:  Completed Updated:  01/31/19 2150    Narrative:        XR CHEST AP PORTABLE 01/31/2019 9:35 PM    Clinical history: chest pain.    Comparison: 01/07/2019    Findings: Perihilar interstitial infiltrates. Bibasal interstitial  infiltrates versus atelectasis. There is no effusion, consolidation or  pneumothorax. The cardiac size is within normal limits. The thoracic  aorta is unremarkable.      Impression:        Perihilar interstitial infiltrates. Bibasal interstitial  infiltrates versus atelectasis. Clinical correlation with pneumonia or  fluid overload is helpful.    Sunday Corn, MD   01/31/2019 9:46 PM      .    Medical Decision Making   I am the first provider for this patient.    I reviewed the vital signs, available nursing notes, past medical history, past surgical history, family history and social history.    Vital Signs-Reviewed the patient's vital signs.     Patient Vitals for the past 12 hrs:   BP Temp Pulse Resp   02/01/19 0432 128/81 97.9 F (36.6 C) 67 20   02/01/19 0301 105/53 98.7 F (37.1 C) (!) 50  20   02/01/19 0135 126/65 -- (!) 57 (!) 23   02/01/19 0000 104/55 -- 72 --   01/31/19 2300 105/62 -- 81 --   01/31/19 2140 113/64 98.7 F (37.1 C) 84 21       Pulse Oximetry Analysis - Normal 96% on RA       Procedures:   Procedures      EKG:  time interpreted 2207 normal sinus rhythm no ST elevation no ST depression no acute ischemic changes     Admit Decision Time:  The decision to admit this patient was made by the emergency provider at 309[time] on 01/31/2019     Clinical Decision Support:       Old Medical Records: Old medical records.     ED Course:   ED Course as of Jan 31 438   Sat Feb 01, 2019   0309 Dr. Minda Meo to admit obs [facility] pt updated agrees    [AD]      ED Course User Index  [AD] Harlene Salts, PA       Provider Notes:    48 y.o. female history of chronic respiratory failure COPD former smoking presents with shortness of breath dyspnea on exertion and cough.  Patient has normal vitals sats and heart rate are normal.  Blood work  unremarkable.  No leukocytosis.      Troponin and EKG nonischemic.  Cardiac etiology of dyspnea less likely with normal BNP.  CT angios done shows improving pneumonitis no evidence of pulmonary embolism.    Patient given a neb steroids antibiotics.    Blood cultures done.    Patient to be admitted for further management of COPD and pneumonitis    During her stay she complained of hip pain which was remarkable for arthritis but no fracture    D/w Ellis Savage, MD    Discharge Prescriptions     None            Diagnosis     Clinical Impression:   1. Pneumonia due to infectious organism, unspecified laterality, unspecified part of lung    2. Chest pain, unspecified type    3. COPD exacerbation    4. PNA (pneumonia)        Treatment Plan:   ED Disposition     ED Disposition Condition Date/Time Comment    Observation  Sat Feb 01, 2019  2:58 AM Admitting Physician: Lynnea Ferrier, MAJID [phone]   Diagnosis: PNA (pneumonia) [164290]   Estimated Length of Stay: < 2 midnights   Tentative Discharge Plan?: Home or Self Care [1]   Patient Class: Observation [104]              _______________________________    CHART OWNERSHIPGeralyn Corwin, PA-C, am the primary clinician of record.  _______________________________       Harlene Salts, PA  02/01/19 0439       Ellis Savage, MD  02/01/19 [phone]

## 2019-02-01 ENCOUNTER — Emergency Department: Payer: Medicaid Other

## 2019-02-01 ENCOUNTER — Observation Stay: Payer: Medicaid Other

## 2019-02-01 DIAGNOSIS — J189 Pneumonia, unspecified organism: Secondary | ICD-10-CM | POA: Diagnosis present

## 2019-02-01 LAB — ECG 12-LEAD
Atrial Rate: 80 {beats}/min
P Axis: 34 degrees
P-R Interval: 182 ms
Q-T Interval: 374 ms
QRS Duration: 92 ms
QTC Calculation (Bezet): 431 ms
R Axis: -3 degrees
T Axis: 42 degrees
Ventricular Rate: 80 {beats}/min

## 2019-02-01 LAB — CBC
Absolute NRBC: 0 10*3/uL (ref 0.00–0.00)
Hematocrit: 40.5 % (ref 34.7–43.7)
Hgb: 12.9 g/dL (ref 11.4–14.8)
MCH: 28.8 pg (ref 25.1–33.5)
MCHC: 31.9 g/dL (ref 31.5–35.8)
MCV: 90.4 fL (ref 78.0–96.0)
MPV: 10.3 fL (ref 8.9–12.5)
Nucleated RBC: 0 /100 WBC (ref 0.0–0.0)
Platelets: 237 10*3/uL (ref 142–346)
RBC: 4.48 10*6/uL (ref 3.90–5.10)
RDW: 14 % (ref 11–15)
WBC: 6.11 10*3/uL (ref 3.10–9.50)

## 2019-02-01 LAB — TSH: TSH: 0.35 u[IU]/mL (ref 0.35–4.94)

## 2019-02-01 LAB — PROCALCITONIN: Procalcitonin: 0.22 — ABNORMAL HIGH (ref 0.00–0.10)

## 2019-02-01 LAB — B-TYPE NATRIURETIC PEPTIDE: B-Natriuretic Peptide: 191 pg/mL — ABNORMAL HIGH (ref 0–100)

## 2019-02-01 MED ORDER — PIPERACILLIN SOD-TAZOBACTAM SO 4.5 (4-0.5) G IV SOLR
4.50 g | Freq: Three times a day (TID) | INTRAVENOUS | Status: DC
Start: 2019-02-01 — End: 2019-02-01
  Administered 2019-02-01: 10:00:00 4.5 g via INTRAVENOUS
  Filled 2019-02-01: qty 20

## 2019-02-01 MED ORDER — NALOXONE HCL 0.4 MG/ML IJ SOLN (WRAP)
0.20 mg | INTRAMUSCULAR | Status: DC | PRN
Start: 2019-02-01 — End: 2019-02-04

## 2019-02-01 MED ORDER — ASPIRIN 81 MG PO CHEW
81.00 mg | CHEWABLE_TABLET | Freq: Every day | ORAL | Status: DC
Start: 2019-02-01 — End: 2019-02-04
  Administered 2019-02-01 – 2019-02-04 (×4): 81 mg via ORAL
  Filled 2019-02-01 (×4): qty 1

## 2019-02-01 MED ORDER — ONDANSETRON 4 MG PO TBDP
4.00 mg | ORAL_TABLET | Freq: Four times a day (QID) | ORAL | Status: DC | PRN
Start: 2019-02-01 — End: 2019-02-04

## 2019-02-01 MED ORDER — ACETAMINOPHEN 325 MG PO TABS
650.0000 mg | ORAL_TABLET | ORAL | Status: DC | PRN
Start: 2019-02-01 — End: 2019-02-04

## 2019-02-01 MED ORDER — ALBUTEROL-IPRATROPIUM 2.5-0.5 (3) MG/3ML IN SOLN
3.00 mL | Freq: Four times a day (QID) | RESPIRATORY_TRACT | Status: DC
Start: 2019-02-01 — End: 2019-02-04
  Administered 2019-02-01 – 2019-02-04 (×12): 3 mL via RESPIRATORY_TRACT
  Filled 2019-02-01 (×12): qty 3

## 2019-02-01 MED ORDER — PIPERACILLIN SOD-TAZOBACTAM SO 4.5 (4-0.5) G IV SOLR
4.50 g | Freq: Once | INTRAVENOUS | Status: AC
Start: 2019-02-01 — End: 2019-02-01
  Administered 2019-02-01: 01:00:00 4.5 g via INTRAVENOUS
  Filled 2019-02-01: qty 20

## 2019-02-01 MED ORDER — HEPARIN SODIUM (PORCINE) 5000 UNIT/ML IJ SOLN
5000.00 [IU] | Freq: Three times a day (TID) | INTRAMUSCULAR | Status: DC
Start: 2019-02-01 — End: 2019-02-04
  Administered 2019-02-01 – 2019-02-02 (×2): 5000 [IU] via SUBCUTANEOUS
  Filled 2019-02-01 (×5): qty 1

## 2019-02-01 MED ORDER — ALPRAZOLAM 0.5 MG PO TABS
0.5000 mg | ORAL_TABLET | Freq: Two times a day (BID) | ORAL | Status: DC | PRN
Start: 2019-02-01 — End: 2019-02-04
  Administered 2019-02-01 – 2019-02-03 (×3): 0.5 mg via ORAL
  Filled 2019-02-01 (×3): qty 1

## 2019-02-01 MED ORDER — METHYLPREDNISOLONE SODIUM SUCC 125 MG IJ SOLR
60.00 mg | Freq: Three times a day (TID) | INTRAMUSCULAR | Status: DC
Start: 2019-02-01 — End: 2019-02-02
  Administered 2019-02-01 – 2019-02-02 (×4): 60 mg via INTRAVENOUS
  Filled 2019-02-01 (×4): qty 2

## 2019-02-01 MED ORDER — GABAPENTIN 400 MG PO CAPS
400.00 mg | ORAL_CAPSULE | Freq: Three times a day (TID) | ORAL | Status: DC
Start: 2019-02-01 — End: 2019-02-04
  Administered 2019-02-01 – 2019-02-04 (×10): 400 mg via ORAL
  Filled 2019-02-01 (×10): qty 1

## 2019-02-01 MED ORDER — FENTANYL CITRATE (PF) 50 MCG/ML IJ SOLN (WRAP)
50.00 ug | Freq: Once | INTRAMUSCULAR | Status: AC
Start: 2019-02-01 — End: 2019-02-01
  Administered 2019-02-01: 01:00:00 50 ug via INTRAVENOUS
  Filled 2019-02-01: qty 2

## 2019-02-01 MED ORDER — OXYBUTYNIN CHLORIDE 5 MG PO TABS
5.0000 mg | ORAL_TABLET | Freq: Three times a day (TID) | ORAL | Status: DC
Start: 2019-02-01 — End: 2019-02-04
  Administered 2019-02-01 – 2019-02-04 (×10): 5 mg via ORAL
  Filled 2019-02-01 (×14): qty 1

## 2019-02-01 MED ORDER — ONDANSETRON HCL 4 MG/2ML IJ SOLN
4.00 mg | Freq: Once | INTRAMUSCULAR | Status: DC | PRN
Start: 2019-02-01 — End: 2019-02-04

## 2019-02-01 MED ORDER — RISAQUAD PO CAPS
1.00 | ORAL_CAPSULE | Freq: Every day | ORAL | Status: DC
Start: 2019-02-01 — End: 2019-02-04
  Administered 2019-02-01 – 2019-02-04 (×4): 1 via ORAL
  Filled 2019-02-01 (×4): qty 1

## 2019-02-01 MED ORDER — ONDANSETRON HCL 4 MG/2ML IJ SOLN
4.00 mg | Freq: Four times a day (QID) | INTRAMUSCULAR | Status: DC | PRN
Start: 2019-02-01 — End: 2019-02-04

## 2019-02-01 MED ORDER — FAMOTIDINE 20 MG PO TABS
20.0000 mg | ORAL_TABLET | Freq: Two times a day (BID) | ORAL | Status: DC
Start: 2019-02-01 — End: 2019-02-04
  Administered 2019-02-01 – 2019-02-04 (×7): 20 mg via ORAL
  Filled 2019-02-01 (×7): qty 1

## 2019-02-01 MED ORDER — ACETAMINOPHEN 325 MG PO TABS
650.0000 mg | ORAL_TABLET | Freq: Once | ORAL | Status: DC | PRN
Start: 2019-02-01 — End: 2019-02-04

## 2019-02-01 MED ORDER — LEVOFLOXACIN 500 MG PO TABS
500.0000 mg | ORAL_TABLET | Freq: Every day | ORAL | Status: DC
Start: 2019-02-01 — End: 2019-02-04
  Administered 2019-02-01 – 2019-02-04 (×4): 500 mg via ORAL
  Filled 2019-02-01 (×4): qty 1

## 2019-02-01 MED ORDER — SERTRALINE HCL 50 MG PO TABS
100.0000 mg | ORAL_TABLET | Freq: Two times a day (BID) | ORAL | Status: DC
Start: 2019-02-01 — End: 2019-02-04
  Administered 2019-02-01 – 2019-02-04 (×7): 100 mg via ORAL
  Filled 2019-02-01 (×8): qty 2

## 2019-02-01 MED ORDER — FLUTICASONE FUROATE-VILANTEROL 200-25 MCG/INH IN AEPB
1.00 | INHALATION_SPRAY | Freq: Every morning | RESPIRATORY_TRACT | Status: DC
Start: 2019-02-01 — End: 2019-02-04
  Administered 2019-02-01 – 2019-02-04 (×4): 1 via RESPIRATORY_TRACT
  Filled 2019-02-01: qty 14

## 2019-02-01 MED ORDER — NICOTINE 14 MG/24HR TD PT24
1.00 | MEDICATED_PATCH | Freq: Every day | TRANSDERMAL | Status: DC
Start: 2019-02-01 — End: 2019-02-04
  Administered 2019-02-01 – 2019-02-04 (×4): 1 via TRANSDERMAL
  Filled 2019-02-01 (×4): qty 1

## 2019-02-01 MED ORDER — OXYCODONE-ACETAMINOPHEN 5-325 MG PO TABS
2.0000 | ORAL_TABLET | ORAL | Status: DC | PRN
Start: 2019-02-01 — End: 2019-02-04
  Administered 2019-02-01 – 2019-02-04 (×14): 2 via ORAL
  Filled 2019-02-01 (×14): qty 2

## 2019-02-01 MED ORDER — IOHEXOL 350 MG/ML IV SOLN
115.00 mL | Freq: Once | INTRAVENOUS | Status: AC | PRN
Start: 2019-02-01 — End: 2019-02-01
  Administered 2019-02-01: 115 mL via INTRAVENOUS

## 2019-02-01 MED ORDER — ALBUTEROL-IPRATROPIUM 2.5-0.5 (3) MG/3ML IN SOLN
3.00 mL | RESPIRATORY_TRACT | Status: DC | PRN
Start: 2019-02-01 — End: 2019-02-04

## 2019-02-01 MED ORDER — VANCOMYCIN HCL 1 G IV SOLR
2000.00 mg | Freq: Once | INTRAVENOUS | Status: AC
Start: 2019-02-01 — End: 2019-02-01
  Administered 2019-02-01: 02:00:00 2000 mg via INTRAVENOUS
  Filled 2019-02-01: qty 2000

## 2019-02-01 MED ORDER — ACETAMINOPHEN 650 MG RE SUPP
650.00 mg | RECTAL | Status: DC | PRN
Start: 2019-02-01 — End: 2019-02-04

## 2019-02-01 MED ORDER — GUAIFENESIN 100 MG/5ML PO SOLN
200.00 mg | ORAL | Status: DC | PRN
Start: 2019-02-01 — End: 2019-02-04
  Administered 2019-02-02 – 2019-02-03 (×3): 200 mg via ORAL
  Filled 2019-02-01 (×7): qty 10

## 2019-02-01 MED ORDER — RISPERIDONE 3 MG PO TABS
3.0000 mg | ORAL_TABLET | Freq: Two times a day (BID) | ORAL | Status: DC
Start: 2019-02-01 — End: 2019-02-04
  Administered 2019-02-01 – 2019-02-04 (×7): 3 mg via ORAL
  Filled 2019-02-01 (×10): qty 1

## 2019-02-01 MED ORDER — DOXYCYCLINE MONOHYDRATE 100 MG PO CAPS
100.00 mg | ORAL_CAPSULE | Freq: Two times a day (BID) | ORAL | Status: DC
Start: 2019-02-01 — End: 2019-02-04
  Administered 2019-02-01 – 2019-02-04 (×6): 100 mg via ORAL
  Filled 2019-02-01 (×6): qty 1

## 2019-02-01 MED ORDER — HEPARIN SODIUM (PORCINE) 5000 UNIT/ML IJ SOLN
5000.00 [IU] | Freq: Two times a day (BID) | INTRAMUSCULAR | Status: DC
Start: 2019-02-01 — End: 2019-02-01
  Administered 2019-02-01: 10:00:00 5000 [IU] via SUBCUTANEOUS
  Filled 2019-02-01: qty 1

## 2019-02-01 NOTE — H&P (Signed)
[facility]      Patient: [name]  Date: 01/31/2019   DOB: Jan 09, 1971  Admission Date: 01/31/2019   MRN: [id number]  Attending: Arabella Merles         Chief Complaint   Patient presents with    Shortness of Breath    Cough      History Gathered From: patient    HISTORY AND PHYSICAL     [name] is a 48 y.o. female with a PMHx of  COPD  On 2lit O2 at home , OSA on cpap , GERD,anxiety presented to emergency room for short of breath cough with the sputum.  Of note she was discharged 01/11/19 from [facility] when she was admitted for COPD exacerbation and pneumonia.  She was fine until few days ago when her symptoms returned with shortness of breath ,cough with the sputum and wheezing on the lung.  She was seen by primary care physician yesterday and she was advised to come to the emergency room.  Work-up in the emergency room showed perihilar infiltrate on chest x-ray, no PE on CT of chest, positive  pneumonitis.  She was given IV antibiotics IV steroids and nebulizer treatment in the emergency room and now she is going to be admitted for further evaluation and treatment    Past Medical History:   Diagnosis Date    Anxiety     Chronic obstructive pulmonary disease     Myocardial infarction     Panic attacks        Past Surgical History:   Procedure Laterality Date    APPENDECTOMY      ARTHROSCOPIC KNEE, ACL RECONSTRUCTION      R. knee     CHOLECYSTECTOMY         Prior to Admission medications    Medication Sig Start Date End Date Taking? Authorizing Provider   albuterol-ipratropium (DUO-NEB) 2.5-0.5(3) mg/3 mL nebulizer Take 3 mLs by nebulization every 6 (six) hours as needed (for SOB or Wheezing) 01/11/19   Marya Landry, MD   ALPRAZolam Duanne Moron) 0.5 MG tablet Take 0.5 mg by mouth 2 (two) times daily as needed       [provider]   aspirin 81 MG chewable tablet Chew 1 tablet (81 mg total) by mouth daily 01/12/19 02/11/19  Marya Landry, MD   atomoxetine  (STRATTERA) 10 MG capsule Take 80 mg by mouth daily       [provider]   benzonatate (TESSALON) 200 MG capsule Take 1 capsule (200 mg total) by mouth 3 (three) times daily as needed for Cough 01/11/19   Marya Landry, MD   fluticasone furoate-vilanterol (BREO ELLIPTA) 200-25 MCG/INH Aerosol Powder, Breath Activtivatede Inhale 1 puff into the lungs every morning 01/11/19   Marya Landry, MD   gabapentin (NEURONTIN) 800 MG tablet Take 800 mg by mouth 3 (three) times daily    [provider]   indomethacin (INDOCIN) 25 MG capsule Take 50 mg by mouth 3 (three) times daily with meals    [provider]   nicotine (NICODERM CQ) 14 MG/24HR Place 1 patch onto the skin daily 01/12/19 02/11/19  Marya Landry, MD   oxybutynin (DITROPAN) 5 MG tablet Take 5 mg by mouth 3 (three) times daily    [provider]   oxyCODONE-acetaminophen (PERCOCET) 10-325 MG per tablet Take 1 tablet by mouth every 4 (four) hours as needed for Pain    [provider]  risperiDONE (RISPERDAL) 3 MG tablet Take 3 mg by mouth 2 (two) times daily    [provider]   sertraline (ZOLOFT) 100 MG tablet Take 100 mg by mouth 2 (two) times daily       [provider]       Allergies   Allergen Reactions    Iodine Solution [Povidone Iodine]      Contrast dye        CODE STATUS: full code     PRIMARY CARE MD: Virgel Bouquet, MD    Family History   Problem Relation Age of Onset    Heart attack Father     Diabetes Father     Asthma Sister     Diabetes Maternal Grandmother     Diabetes Paternal Grandmother     Heart disease Paternal Grandmother        Social History     Tobacco Use    Smoking status: Former Smoker     Packs/day: 1.00     Years: 25.00     Pack years: 25.00     Types: Cigarettes     Last attempt to quit: 12/29/2018     Years since quitting: 0.0    Smokeless tobacco: Never Used   Substance Use Topics    Alcohol use: Never     Frequency: Never    Drug use: Never       REVIEW  OF SYSTEMS   Positive for: SOB, cough ,wheezing     All ROS completed and otherwise negative.    PHYSICAL EXAM   Vital Signs (most recent): BP 128/81    Pulse 67    Temp 97.9 F (36.6 C) (Oral)    Resp 20    Ht 1.753 m (_0 )    Wt 152.9 kg (337 lb 1.3 oz)    SpO2 92%    BMI 49.78 kg/m   Constitutional:well nourished  obese female .   HEENT: NC/AT, PERRL, no scleral icterus or conjunctival pallor, no nasal discharge,, oropharynx without erythema or exudate  Neck: trachea midline, supple, no cervical or supraclavicular lymphadenopathy or masses  Cardiovascular: S1 S2, no murmurs, gallops, palpable thrills, no JVD, Non-displaced PMI.  Respiratory: bil crackle , scattered  Wheezing   Gastrointestinal: +BS, non-distended, soft, non-tender, no rebound or guarding, no hepatosplenomegaly  Genitourinary: no suprapubic or costovertebral angle tenderness  Musculoskeletal: ROM and motor strength grossly normal. No clubbing, edema, or cyanosis. DP and radial pulses 2+ and symmetric.  Skin exam:  No rashes   Neurologic: EOMI, CN 2-12 grossly intact. no gross motor or sensory deficits  Psychiatric: AAOx3, affect and mood appropriate. The patient is alert, interactive, appropriate.      Exam done by Arabella Merles, MD on 02/01/19 at 4:45 AM      LABS & IMAGING     Recent Results (from the past 24 hour(s))   Comprehensive metabolic panel    Collection Time: 01/31/19 10:42 PM   Result Value Ref Range    Glucose 80 70 - 100 mg/dL    BUN 8.0 7.0 - 19.0 mg/dL    Creatinine 0.8 0.6 - 1.0 mg/dL    Sodium 139 136 - 145 mEq/L    Potassium 4.2 3.5 - 5.1 mEq/L    Chloride 109 100 - 111 mEq/L    CO2 23 22 - 29 mEq/L    Calcium 9.1 8.5 - 10.5 mg/dL    Protein, Total 5.7 (L) 6.0 - 8.3 g/dL  Albumin 3.0 (L) 3.5 - 5.0 g/dL    AST (SGOT) 10 5 - 34 U/L    ALT 10 0 - 55 U/L    Alkaline Phosphatase 95 37 - 106 U/L    Bilirubin, Total 0.3 0.2 - 1.2 mg/dL    Globulin 2.7 2.0 - 3.6 g/dL    Albumin/Globulin Ratio 1.1 0.9 - 2.2    Anion Gap  7.0 5.0 - 15.0   CBC and differential    Collection Time: 01/31/19 10:42 PM   Result Value Ref Range    WBC 7.40 3.10 - 9.50 x10 3/uL    Hgb 12.2 11.4 - 14.8 g/dL    Hematocrit 38.0 34.7 - 43.7 %    Platelets 222 142 - 346 x10 3/uL    RBC 4.23 3.90 - 5.10 x10 6/uL    MCV 89.8 78.0 - 96.0 fL    MCH 28.8 25.1 - 33.5 pg    MCHC 32.1 31.5 - 35.8 g/dL    RDW 14 11 - 15 %    MPV 10.4 8.9 - 12.5 fL    Neutrophils 43.7 None %    Lymphocytes Automated 33.8 None %    Monocytes 6.6 None %    Eosinophils Automated 14.6 None %    Basophils Automated 0.9 None %    Immature Granulocyte 0.4 None %    Nucleated RBC 0.0 0.0 - 0.0 /100 WBC    Neutrophils Absolute 3.23 1.10 - 6.33 x10 3/uL    Abs Lymph Automated 2.50 0.42 - 3.22 x10 3/uL    Abs Mono Automated 0.49 0.21 - 0.85 x10 3/uL    Abs Eos Automated 1.08 (H) 0.00 - 0.44 x10 3/uL    Absolute Baso Automated 0.07 0.00 - 0.08 x10 3/uL    Absolute Immature Granulocyte 0.03 0.00 - 0.07 x10 3/uL    Absolute NRBC 0.00 0.00 - 0.00 x10 3/uL   Troponin I    Collection Time: 01/31/19 10:42 PM   Result Value Ref Range    Troponin I <0.01 0.00 - 0.05 ng/mL   PT/APTT    Collection Time: 01/31/19 10:42 PM   Result Value Ref Range    PT 12.5 (L) 12.6 - 15.0 sec    PT INR 0.9 0.9 - 1.1    PTT 26 23 - 37 sec   Beta HCG, Quant, Serum    Collection Time: 01/31/19 10:42 PM   Result Value Ref Range    hCG, Quant. <1.2 See below   B-type Natriuretic Peptide    Collection Time: 01/31/19 10:42 PM   Result Value Ref Range    B-Natriuretic Peptide 28 0 - 100 pg/mL   Hemolysis index    Collection Time: 01/31/19 10:42 PM   Result Value Ref Range    Hemolysis Index 5 0 - 18   GFR    Collection Time: 01/31/19 10:42 PM   Result Value Ref Range    EGFR >60.0              Markers:  Recent Labs   Lab 01/31/19  2242   Troponin I <0.01       EMERGENCY DEPARTMENT COURSE:  Orders Placed This Encounter   Procedures    Blood Culture Aerobic/Anaerobic #1    Blood Culture Aerobic/Anaerobic #2    Rapid influenza A/B  antigens    Chest AP Portable    CT Angio Chest (PE study)    Hip Right AP and Lateral with Pelvis    Comprehensive  metabolic panel    CBC and differential    Troponin I    PT/APTT    Beta HCG, Quant, Serum    B-type Natriuretic Peptide    Hemolysis index    GFR    Diet cardiac    Cardiac monitoring (ED ONLY)    ED Holding Orders Expire in 24 Hours    Notify Admitting Attending ( Change in Condition)    Notify Attending of Patient Arrival to Floor within 24 Hours    Notify Physician (Vital Signs)    Notify Physician (Lab Results)    Vital Signs Q4HR    ED Unit Sec Comm Order    ECG 12 lead    Saline lock IV    Place (admit) for Observation Services    [facility] ED Bed Request (Observation)       ASSESSMENT & PLAN   Exacerbation COPD  - iv abx, iv steroid, nebs, pulse ox monitoring  - f/u procalcitonin    GERD  - cont Gi prophylaxy    Tobacco abuse   - cont' nicotine patch    OSA  -  cpap overnight     Anxiety  - cont' home meds     Obesity  - diet and exercise consealed     Anticipated medical stability for discharge: per medical improvement    Service status/Reason for ongoing hospitalization: COPD exacerbation       Signed,  Arabella Merles    02/01/2019 4:45 AM  Time Elapsed:45 minutes

## 2019-02-01 NOTE — Plan of Care (Signed)
Problem: Safety  Goal: Patient will be free from injury during hospitalization  Outcome: Progressing  Flowsheets (Taken 02/01/[phone])  Patient will be free from injury during hospitalization : Assess patient's risk for falls and implement fall prevention plan of care per policy; Provide and maintain safe environment; Use appropriate transfer methods; Ensure appropriate safety devices are available at the bedside; Include patient/ family/ care giver in decisions related to safety; Hourly rounding     Problem: Pain  Goal: Pain at adequate level as identified by patient  Outcome: Progressing  Flowsheets (Taken 02/01/2019 0635)  Pain at adequate level as identified by patient: Identify patient comfort function goal; Assess for risk of opioid induced respiratory depression, including snoring/sleep apnea. Alert healthcare team of risk factors identified.; Assess pain on admission, during daily assessment and/or before any "as needed" intervention(s); Reassess pain within 30-60 minutes of any procedure/intervention, per Pain Assessment, Intervention, Reassessment (AIR) Cycle; Evaluate if patient comfort function goal is met; Evaluate patient's satisfaction with pain management progress; Offer non-pharmacological pain management interventions; Include patient/patient care companion in decisions related to pain management as needed  Note:   COMPLAINED OF CHEST PAIN WHEN SHE COUGHS, LEFT HIP PAIN, PERCOCET 2TABS PO GIVEN AS PRN.     Problem: Infection  Goal: Free from infection  Outcome: Progressing  Flowsheets (Taken 02/01/2019 0635)  Free from infection : Assess for signs/symptoms of infection; Assess immunization status; Consult/collaborate with Infection Preventionist; Utilize sepsis protocol  Note:   Admitted for Pneumonia, copd, afebrile, on nebs treatment, IV steroids, IV abx

## 2019-02-01 NOTE — Plan of Care (Signed)
Pt remained alert and oriented x4, stand by assist to the bedside commode. Pt complained painand prn med given per MD order. Per rounding MD. Pt will be on droplet precaution to rule out Flu. Isolation sign placed.   To prevent fall and injury pt educated to call for help before getting out of bed at all times. Call bell within reach, bed at lowest position, belongings within reach to pt     Problem: Pain  Goal: Pain at adequate level as identified by patient  Flowsheets (Taken 02/01/2019 0635 by Pincus Sanes, RN)  Pain at adequate level as identified by patient: Identify patient comfort function goal;Assess for risk of opioid induced respiratory depression, including snoring/sleep apnea. Alert healthcare team of risk factors identified.;Assess pain on admission, during daily assessment and/or before any "as needed" intervention(s);Reassess pain within 30-60 minutes of any procedure/intervention, per Pain Assessment, Intervention, Reassessment (AIR) Cycle;Evaluate if patient comfort function goal is met;Evaluate patient's satisfaction with pain management progress;Offer non-pharmacological pain management interventions;Include patient/patient care companion in decisions related to pain management as needed     Problem: Moderate/High Fall Risk Score >5  Goal: Patient will remain free of falls  Flowsheets (Taken 02/01/2019 1136)  High (Greater than 13): HIGH-Consider use of low bed; HIGH-Initiate use of floor mats as appropriate; HIGH-Apply yellow "Fall Risk" arm band; HIGH-Bed alarm on at all times while patient in bed     Problem: Compromised Hemodynamic Status  Goal: Vital signs and fluid balance maintained/improved  Flowsheets (Taken 02/01/2019 1136)  Vital signs and fluid balance are maintained/improved: Position patient for maximum circulation/cardiac output; Monitor/assess vitals and hemodynamic parameters with position changes; Monitor intake and output. Notify LIP if urine output is less than 30 mL/hour.;  Monitor/assess lab values and report abnormal values     Problem: Infection  Goal: Free from infection  Flowsheets (Taken 02/01/2019 0635 by Pincus Sanes, RN)  Free from infection : Assess for signs/symptoms of infection;Assess immunization status;Consult/collaborate with Infection Preventionist;Utilize sepsis protocol

## 2019-02-01 NOTE — Progress Notes (Signed)
[facility]  PROGRESS NOTE      Patient: [name]  Date: 02/01/2019   LOS: 0 Days  Admission Date: 01/31/2019   MRN: [id number]  Attending: Adrienne Mocha  Please contact me on [employer]            SUBJECTIVE     Patient seen and examined at bedside with RN. Reports shortness of breath and productive cough.  States she stopped smoking. Denies chest pain. Updated on plan of care. Denies abdominal pain, nausea, vomiting, diarrhea.        OBJECTIVE     Vitals:    02/01/19 1459   BP: 136/70   Pulse: 77   Resp: 20   Temp: 97.9 F (36.6 C)   SpO2: 96%       Temperature: Temp  Min: 97.5 F (36.4 C)  Max: 98.7 F (37.1 C)  Pulse: Pulse  Min: 42  Max: 84  Respiratory: Resp  Min: 20  Max: 23  Non-Invasive BP: BP  Min: 104/55  Max: 154/76  Pulse Oximetry SpO2  Min: 92 %  Max: 96 %    Intake and Output Summary (Last 24 hours) at Date Time    Intake/Output Summary (Last 24 hours) at 02/01/2019 1906  Last data filed at 02/01/2019 1453  Gross per 24 hour   Intake 1400 ml   Output --   Net 1400 ml       PHYSICAL EXAMINATION  GEN APPEARANCE: chronically ill-appearing morbidly obese female in NAD.    Alert and cooperative. Comfortable.   HEENT: NCAT, EOMI, PERRL, no nasal discharge.   conjunctivae/corneas clear. Oral mucosa moist.   Neck: Supple without meningismus   CVS: RRR, S1, S2. No murmur, click, rub, or gallop   LUNGS: coarse with diffuse expiratory wheezes, symmetric air entry.    ABD: BS+, soft, NT, ND, no guarding or rigidity  EXT: 1+ BLE edema; Pulses 2+ and intact  NEURO: AAOx3, CN 2-12 intact.  No focal neurological deficits  MENTAL STATUS: appropriate affect and mood      MEDICATIONS     Current Facility-Administered Medications   Medication Dose Route Frequency    albuterol-ipratropium  3 mL Nebulization Q6H SCH    aspirin  81 mg Oral Daily    doxycycline  100 mg Oral Q12H SCH    famotidine  20 mg Oral Q12H SCH    fluticasone furoate-vilanterol  1 puff Inhalation QAM    gabapentin  400 mg Oral TID     heparin (porcine)  5,000 Units Subcutaneous Q12H [facility]    lactobacillus/streptococcus  1 capsule Oral Daily    levoFLOXacin  500 mg Oral Daily    methylPREDNISolone  60 mg Intravenous Q8H    nicotine  1 patch Transdermal Daily    oxybutynin  5 mg Oral TID    risperiDONE  3 mg Oral Q12H    sertraline  100 mg Oral Q12H            LABS/IMAGING     Recent Labs   Lab 02/01/19  0917 01/31/19  2242   WBC 6.11 7.40   RBC 4.48 4.23   Hgb 12.9 12.2   Hematocrit 40.5 38.0   MCV 90.4 89.8   Platelets 237 222       Recent Labs   Lab 01/31/19  2242   Sodium 139   Potassium 4.2   Chloride 109   CO2 23   BUN 8.0   Creatinine 0.8   Glucose  80   Calcium 9.1       Recent Labs   Lab 01/31/19  2242   ALT 10   AST (SGOT) 10   Bilirubin, Total 0.3   Albumin 3.0*   Alkaline Phosphatase 95       Recent Labs   Lab 01/31/19  2242   Troponin I <0.01       Recent Labs   Lab 01/31/19  2242   PT INR 0.9   PT 12.5*   PTT 26       Microbiology Results     Procedure Component Value Units Date/Time    Blood Culture Aerobic/Anaerobic #1 [[id number]] Collected:  01/31/19 2242    Specimen:  Arm from Blood, Venipuncture Updated:  02/01/19 0815    Narrative:       1 BLUE+1 PURPLE    Blood Culture Aerobic/Anaerobic #2 [[id number]] Collected:  01/31/19 2242    Specimen:  Arm from Blood, Venipuncture Updated:  02/01/19 0815    Narrative:       1 BLUE+1 PURPLE    Rapid influenza A/B antigens [[id number]] Collected:  01/31/19 2242    Specimen:  Nasopharyngeal from Nasal Aspirate Updated:  01/31/19 2333    Narrative:       ORDER#: [id number]                                    ORDERED BY: Vista Lawman  SOURCE: Nasal Aspirate                               COLLECTED:  01/31/19 22:42  ANTIBIOTICS AT COLL.:                                RECEIVED :  01/31/19 23:11  Influenza Rapid Antigen A&B                FINAL       01/31/19 23:33  01/31/19   Negative for Influenza A and B             Reference Range: Negative      Respiratory Pathogen Panel, PCR (Film  Array) [[id number]] Collected:  02/01/19 1726     Updated:  02/01/19 1727    Narrative:       Viral Transport Media (VTM)            Hip Right Ap And Lateral With Pelvis    Result Date: 02/01/2019  Right more severe than left hip osteoarthrosis. Guy Sandifer, MD 02/01/2019 3:08 AM    Ct Angio Chest (pe Study)    Result Date: 02/01/2019  No pulmonary embolism. Improving pneumonitis. Guy Sandifer, MD 02/01/2019 1:18 AM       Chest Ap Portable    Result Date: 01/31/2019   Perihilar interstitial infiltrates. Bibasal interstitial infiltrates versus atelectasis. Clinical correlation with pneumonia or fluid overload is helpful. Sunday Corn, MD 01/31/2019 9:46 PM            ASSESSMENT/PLAN   Patient Active Hospital Problem List:   PNA (pneumonia)     Acute COPD exacerbation           [name] is a 48 y.o. female with a PMH of COPD, tobacco use, CAD , and anxiety admitted for pneumonia and COPD exacerbation.  CTA chest negative for PE       #Pneumonia   #Acute COPD exacerbation  -continue iv solumedrol and taper  -ID consulted, abx changed to levaquin and doxycycline  -scheduled duonebs, breo  -supplemental oxygen     #CAD  -stable, continue aspirin    #OSA   -on cpap qhs     #Anxiety  -continue risperdal and zoloft      Code: FULL  DVT prophylaxis: heparin sc  Disposition: pending clinical course             Signed,  Adrienne Mocha           This chart was generated using hospital voice-recognition software which does not employ spell-checking or grammar-checking features. It was dictated, all or in part, in a busy and often noisy patient care environment. I have taken all usual measures to dictate carefully and to review all aspects this chart. Nonetheless, given the known and well-documented performance characteristics of VR software in such patient care environments, this dictation still may contain unrecognized and wholly unintended errors or omissions

## 2019-02-01 NOTE — ED Notes (Addendum)
[facility] ED NURSING NOTE FOR THE RECEIVING INPATIENT NURSE   ED NURSE Aleathia Purdy   [place] [phone]   ED CHARGE RN [phone]   ADMISSION INFORMATION   [name] is a 48 y.o. female admitted with a diagnosis of:    1. Pneumonia due to infectious organism, unspecified laterality, unspecified part of lung    2. Chest pain, unspecified type    3. COPD exacerbation    4. PNA (pneumonia)         Isolation: None   Allergies: Iodine solution [povidone iodine]   Holding Orders confirmed? Yes   Belongings Documented? Yes   Home medications sent to pharmacy confirmed? No   NURSING CARE   Mental Status: alert and oriented   ADL: Needs assistance with ADLs   Ambulation: mild difficulty   Pertinent Information  and Safety Concerns: Pt uses walker but has dyspnea on exertion      ED Medications: See below, ED ISHAPED (P) Plan for medications administered in the ED   CT / NIH   CT Head ordered on this patient?  No   NIH/Dysphagia assessment done prior to admission? No    VITAL SIGNS   Time BP Temp Pulse Resp SpO2   0301 105/53 98._0 95%   IV LINES   IV Catheter Size: 18 g  Peripheral IV 01/31/19 Left Forearm (Active)   Site Assessment Clean;Dry;Intact 01/31/2019 10:51 PM   Line Status Saline Locked;Capped;Flushed;Blood return noted 01/31/2019 10:51 PM   Tubing Dated? Yes 01/31/2019 10:51 PM   Dressing Status Clean;Dry;Intact 01/31/2019 10:51 PM   Number of days: 0        LAB RESULTS   Labs Reviewed   COMPREHENSIVE METABOLIC PANEL - Abnormal; Notable for the following components:       Result Value    Protein, Total 5.7 (*)     Albumin 3.0 (*)     All other components within normal limits   CBC AND DIFFERENTIAL - Abnormal; Notable for the following components:    Abs Eos Automated 1.08 (*)     All other components within normal limits   PT AND APTT - Abnormal; Notable for the following components:    PT 12.5 (*)     All other components within normal limits   RAPID INFLUENZA A/B ANTIGENS    Narrative:     ORDER#: [id number]                                     ORDERED BY: Vista Lawman  SOURCE: Nasal Aspirate                               COLLECTED:  01/31/19 22:42  ANTIBIOTICS AT COLL.:                                RECEIVED :  01/31/19 23:11  Influenza Rapid Antigen A&B                FINAL       01/31/19 23:33  01/31/19   Negative for Influenza A and B             Reference Range: Negative     CULTURE BLOOD AEROBIC AND ANAEROBIC    Narrative:     1 BLUE+1 PURPLE  CULTURE BLOOD AEROBIC AND ANAEROBIC    Narrative:     1 BLUE+1 PURPLE   TROPONIN I   HCG QUANTITATIVE   B-TYPE NATRIURETIC PEPTIDE   HEMOLYSIS INDEX   GFR

## 2019-02-01 NOTE — Consults (Addendum)
Infectious Diseases and Tropical Medicine Consult  Heywood Footman, MD          Date Time: 02/01/19 11:16 AM  Patient Name: [name]  Referring Physician: Adrienne Mocha, MD      Reason for Consultation:      Pneumonia    Assessment:      COPD exacerbation   Sleep apnea   Gastroesophageal reflux disease   Heavy smoking   Morbid obesity    Recommendations:      Start doxycycline and Levaquin   Continue nebs and steroids   Procalcitonin level   Sputum culture   Follow-up chest x-ray   Probiotics    Monitor electrolytes and renal functions closely   Monitor clinically                                                                   History of Present Illness:     [name] is a 48 year old female with a history of COPD on home oxygen, sleep apnea, gastroesophageal reflux disease, heavy smoking, myocardial infarction, generalized anxiety disorder who was recently discharged from hospital when she was admitted with COPD exacerbation/pneumonia is readmitted since 01/31/2019 because of worsening cough and shortness of breath.  Patient also complains of wheezing.  No associated fevers or chills.  In the emergency room patient's temperature is 98.7, blood pressure 113/64, pulse of 84 bpm, leukocyte count of 7.4, influenza screen is negative and CT scan of the chest shows improving pneumonitis.  Patient is started on Zosyn and also has received 1 dose of vancomycin.  Blood cultures and respiratory pathogen panel is ordered and is pending.    Past Medical History:      COPD   Myocardial infarction   Generalized anxiety disorder    Past Surgical History:      Appendectomy   Right knee arthroscopy   Cholecystectomy    Family History:      Heart disease, diabetes mellitus-father   Asthma-sister   Diabetes mellitus-maternal grandmother, paternal grandmother    Social History:      25-year pack smoking history   No alcohol    Allergies:      Iodine    Review of Systems:     General:   Comfortable, no acute distress, no fever or chills, sitting on the side of the bed  HEENT: No runny nose but complains of sore throat  Respiratory: Complains of cough productive of greenish sputum, complains of shortness of breath, no hematemesis or hemoptysis  Cardiac: no chest pain  Abdomen: no abdominal pain, no nausea vomiting or diarrhea  Neurologic: awake and alert  Extremities: no joint pains or swelling  Genitourinary: No dysuria or hematuria  Dermatologic: no skin rashes, no itching    Physical Exam:     Blood pressure 154/76, pulse (!) 51, temperature 97.5 F (36.4 C), temperature source Oral, resp. rate 20, height 1.753 m (_0 ), weight 152.9 kg (337 lb 1.3 oz), SpO2 93 %.    General Appearance: Comfortable, well-appearing and in no acute distress.    HEENT:  Head is normocephalic, atraumatic, pupils are equal and reactive to light  Neck:    Supple,No neck lymphadenopathy, no thyromegaly  Lungs: Scattered wheezing, bibasilar Rales  Chest Wall: Symmetric  chest wall expansion.   Heart : Regular rate and rhythm, no murmur or gallop  Abdomen: Abdomen is soft, nontender, good bowel sounds, no hepatosplenomegaly  Neurological: Awake and alert, normal muscle strength, no focal deficit  Extremities: No edema, no clubbing or cyanosis  Skin:  Warm and dry.No rash or ecchymosis.   Psychiatric: Mood and affect is normal    Labs:     Recent Labs     02/01/19  0917 01/31/19  2242   WBC 6.11 7.40   Hgb 12.9 12.2   Hematocrit 40.5 38.0   Platelets 237 222   MCV 90.4 89.8       Recent Labs     01/31/19  2242   Sodium 139   Potassium 4.2   Chloride 109   CO2 23   BUN 8.0   Creatinine 0.8   Glucose 80   Calcium 9.1       Recent Labs     01/31/19  2242   AST (SGOT) 10   ALT 10   Alkaline Phosphatase 95   Protein, Total 5.7*   Albumin 3.0*   Bilirubin, Total 0.3         Imaging studies:           Thanks for the consultation          Signed by: Heywood Footman, MD  Date Time: 02/01/19 11:16 AM      *This note was  generated by the Epic EMR system/ Dragon speech recognition and may contain inherent errors or omissions not intended by the user. Grammatical errors, random word insertions, deletions, pronoun errors and incomplete sentences are occasional consequences of this technology due to software limitations. Not all errors are caught or corrected. If there are questions or concerns about the content of this note or information contained within the body of this dictation they should be addressed directly with the author for clarification

## 2019-02-01 NOTE — Progress Notes (Addendum)
Admitted a 48 y/o female from ED at 0430, via stretcher, placed on bed comfortably, A/O x4, no respiratory distress noted, 02 sat 92% on RA, Dr. Tyson Babinski seen pt with orders made, pts belongings checked, no home meds at bedside, fall precaution initiated,

## 2019-02-02 LAB — CBC AND DIFFERENTIAL
Absolute NRBC: 0 10*3/uL (ref 0.00–0.00)
Basophils Absolute Automated: 0.03 10*3/uL (ref 0.00–0.08)
Basophils Automated: 0.3 %
Eosinophils Absolute Automated: 0 10*3/uL (ref 0.00–0.44)
Eosinophils Automated: 0 %
Hematocrit: 39.4 % (ref 34.7–43.7)
Hgb: 12.6 g/dL (ref 11.4–14.8)
Immature Granulocytes Absolute: 0.09 10*3/uL — ABNORMAL HIGH (ref 0.00–0.07)
Immature Granulocytes: 0.8 %
Lymphocytes Absolute Automated: 0.8 10*3/uL (ref 0.42–3.22)
Lymphocytes Automated: 7.2 %
MCH: 29.2 pg (ref 25.1–33.5)
MCHC: 32 g/dL (ref 31.5–35.8)
MCV: 91.2 fL (ref 78.0–96.0)
MPV: 10.1 fL (ref 8.9–12.5)
Monocytes Absolute Automated: 0.21 10*3/uL (ref 0.21–0.85)
Monocytes: 1.9 %
Neutrophils Absolute: 10.04 10*3/uL — ABNORMAL HIGH (ref 1.10–6.33)
Neutrophils: 89.8 %
Nucleated RBC: 0 /100 WBC (ref 0.0–0.0)
Platelets: 255 10*3/uL (ref 142–346)
RBC: 4.32 10*6/uL (ref 3.90–5.10)
RDW: 14 % (ref 11–15)
WBC: 11.17 10*3/uL — ABNORMAL HIGH (ref 3.10–9.50)

## 2019-02-02 LAB — GLUCOSE WHOLE BLOOD - POCT
Whole Blood Glucose POCT: 201 mg/dL — ABNORMAL HIGH (ref 70–100)
Whole Blood Glucose POCT: 117 mg/dL — ABNORMAL HIGH (ref 70–100)
Whole Blood Glucose POCT: 120 mg/dL — ABNORMAL HIGH (ref 70–100)

## 2019-02-02 LAB — BASIC METABOLIC PANEL
Anion Gap: 7 (ref 5.0–15.0)
BUN: 8 mg/dL (ref 7.0–19.0)
CO2: 20 mEq/L — ABNORMAL LOW (ref 22–29)
Calcium: 9.3 mg/dL (ref 8.5–10.5)
Chloride: 110 mEq/L (ref 100–111)
Creatinine: 0.9 mg/dL (ref 0.6–1.0)
Glucose: 279 mg/dL — ABNORMAL HIGH (ref 70–100)
Potassium: 4.4 mEq/L (ref 3.5–5.1)
Sodium: 137 mEq/L (ref 136–145)

## 2019-02-02 LAB — GFR: EGFR: >60

## 2019-02-02 LAB — HEMOLYSIS INDEX: Hemolysis Index: 3 (ref 0–18)

## 2019-02-02 LAB — PROCALCITONIN: Procalcitonin: 0.04 (ref 0.00–0.10)

## 2019-02-02 MED ORDER — PREDNISONE 20 MG PO TABS
40.0000 mg | ORAL_TABLET | Freq: Every morning | ORAL | Status: DC
Start: 2019-02-03 — End: 2019-02-02

## 2019-02-02 MED ORDER — METHYLPREDNISOLONE SODIUM SUCC 125 MG IJ SOLR
40.0000 mg | Freq: Three times a day (TID) | INTRAMUSCULAR | Status: AC
Start: 2019-02-02 — End: 2019-02-02
  Administered 2019-02-02 (×2): 40 mg via INTRAVENOUS
  Filled 2019-02-02 (×2): qty 2

## 2019-02-02 MED ORDER — INSULIN LISPRO 100 UNIT/ML SC SOLN
1.00 [IU] | Freq: Three times a day (TID) | SUBCUTANEOUS | Status: DC
Start: 2019-02-02 — End: 2019-02-04
  Administered 2019-02-02: 15:00:00 3 [IU] via SUBCUTANEOUS
  Filled 2019-02-02: qty 9

## 2019-02-02 NOTE — Plan of Care (Signed)
Problem: Safety  Goal: Patient will be free from injury during hospitalization  Outcome: Progressing  Flowsheets (Taken 02/01/2019 1597 by Pincus Sanes, RN)  Patient will be free from injury during hospitalization : Assess patient's risk for falls and implement fall prevention plan of care per policy;Provide and maintain safe environment;Use appropriate transfer methods;Ensure appropriate safety devices are available at the bedside;Include patient/ family/ care giver in decisions related to safety;Hourly rounding     Problem: Pain  Goal: Pain at adequate level as identified by patient  Outcome: Progressing  Flowsheets (Taken 02/01/2019 0635 by Pincus Sanes, RN)  Pain at adequate level as identified by patient: Identify patient comfort function goal;Assess for risk of opioid induced respiratory depression, including snoring/sleep apnea. Alert healthcare team of risk factors identified.;Assess pain on admission, during daily assessment and/or before any "as needed" intervention(s);Reassess pain within 30-60 minutes of any procedure/intervention, per Pain Assessment, Intervention, Reassessment (AIR) Cycle;Evaluate if patient comfort function goal is met;Evaluate patient's satisfaction with pain management progress;Offer non-pharmacological pain management interventions;Include patient/patient care companion in decisions related to pain management as needed     Problem: Infection  Goal: Free from infection  Outcome: Progressing  Flowsheets (Taken 02/01/2019 0635 by Pincus Sanes, RN)  Free from infection : Assess for signs/symptoms of infection;Assess immunization status;Consult/collaborate with Infection Preventionist;Utilize sepsis protocol

## 2019-02-02 NOTE — Progress Notes (Signed)
[facility]  PROGRESS NOTE      Patient: [name]  Date: 02/02/2019   LOS: 0 Days  Admission Date: 01/31/2019   MRN: [id number]  Attending: Adrienne Mocha  Please contact me on [employer]            SUBJECTIVE     Patient seen and examined at bedside with RN. Feels same as yesterday,   continues to have shortness of breath and productive cough.  States she is on home oxygen prn 2L. Updated on plan of care.   Denies chest pain, abdominal pain, nausea, vomiting, diarrhea.        OBJECTIVE     Vitals:    02/02/19 1114   BP: 108/67   Pulse: 88   Resp: 16   Temp: 98.2 F (36.8 C)   SpO2: 93%       Temperature: Temp  Min: 97.9 F (36.6 C)  Max: 98.6 F (37 C)  Pulse: Pulse  Min: 57  Max: 88  Respiratory: Resp  Min: 16  Max: 20  Non-Invasive BP: BP  Min: 108/67  Max: 136/70  Pulse Oximetry SpO2  Min: 93 %  Max: 97 %    Intake and Output Summary (Last 24 hours) at Date Time    Intake/Output Summary (Last 24 hours) at 02/02/2019 1323  Last data filed at 02/02/2019 1300  Gross per 24 hour   Intake 1140 ml   Output 2000 ml   Net -860 ml       PHYSICAL EXAMINATION  GEN APPEARANCE: chronically ill-appearing morbidly obese female in NAD.    Alert and cooperative.    HEENT: NCAT, EOMI, PERRL, no nasal discharge.   conjunctivae/corneas clear. Oral mucosa moist.   Neck: Supple without meningismus   CVS: RRR, S1, S2. No murmur   LUNGS: coarse with diffuse expiratory wheezes, symmetric air entry. On O2   ABD: BS+, soft, NT, ND, no guarding or rigidity  EXT: trace BLE edema, improved. Pulses 2+ and intact  NEURO: AAOx3, CN 2-12 intact.  No focal neurological deficits  MENTAL STATUS: appropriate affect and mood      MEDICATIONS     Current Facility-Administered Medications   Medication Dose Route Frequency    albuterol-ipratropium  3 mL Nebulization Q6H SCH    aspirin  81 mg Oral Daily    doxycycline  100 mg Oral Q12H SCH    famotidine  20 mg Oral Q12H SCH    fluticasone furoate-vilanterol  1 puff Inhalation QAM     gabapentin  400 mg Oral TID    heparin (porcine)  5,000 Units Subcutaneous Q8H [facility]    lactobacillus/streptococcus  1 capsule Oral Daily    levoFLOXacin  500 mg Oral Daily    methylPREDNISolone  40 mg Intravenous Q8H    nicotine  1 patch Transdermal Daily    oxybutynin  5 mg Oral TID    risperiDONE  3 mg Oral Q12H    sertraline  100 mg Oral Q12H            LABS/IMAGING     Recent Labs   Lab 02/02/19  1109 02/01/19  0917 01/31/19  2242   WBC 11.17* 6.11 7.40   RBC 4.32 4.48 4.23   Hgb 12.6 12.9 12.2   Hematocrit 39.4 40.5 38.0   MCV 91.2 90.4 89.8   Platelets 255 237 222       Recent Labs   Lab 02/02/19  1109 01/31/19  2242   Sodium  137 139   Potassium 4.4 4.2   Chloride 110 109   CO2 20* 23   BUN 8.0 8.0   Creatinine 0.9 0.8   Glucose 279* 80   Calcium 9.3 9.1       Recent Labs   Lab 01/31/19  2242   ALT 10   AST (SGOT) 10   Bilirubin, Total 0.3   Albumin 3.0*   Alkaline Phosphatase 95       Recent Labs   Lab 01/31/19  2242   Troponin I <0.01       Recent Labs   Lab 01/31/19  2242   PT INR 0.9   PT 12.5*   PTT 26       Microbiology Results     Procedure Component Value Units Date/Time    Blood Culture Aerobic/Anaerobic #1 [[id number]] Collected:  01/31/19 2242    Specimen:  Arm from Blood, Venipuncture Updated:  02/01/19 0815    Narrative:       1 BLUE+1 PURPLE    Blood Culture Aerobic/Anaerobic #2 [[id number]] Collected:  01/31/19 2242    Specimen:  Arm from Blood, Venipuncture Updated:  02/01/19 0815    Narrative:       1 BLUE+1 PURPLE    Rapid influenza A/B antigens [[id number]] Collected:  01/31/19 2242    Specimen:  Nasopharyngeal from Nasal Aspirate Updated:  01/31/19 2333    Narrative:       ORDER#: [id number]                                    ORDERED BY: Vista Lawman  SOURCE: Nasal Aspirate                               COLLECTED:  01/31/19 22:42  ANTIBIOTICS AT COLL.:                                RECEIVED :  01/31/19 23:11  Influenza Rapid Antigen A&B                FINAL       01/31/19  23:33  01/31/19   Negative for Influenza A and B             Reference Range: Negative      Respiratory Pathogen Panel, PCR (Film Array) [[id number]] Collected:  02/01/19 1726     Updated:  02/01/19 1727    Narrative:       Viral Transport Media (VTM)            Hip Right Ap And Lateral With Pelvis    Result Date: 02/01/2019  Right more severe than left hip osteoarthrosis. Guy Sandifer, MD 02/01/2019 3:08 AM    Ct Angio Chest (pe Study)    Result Date: 02/01/2019  No pulmonary embolism. Improving pneumonitis. Guy Sandifer, MD 02/01/2019 1:18 AM       Chest Ap Portable    Result Date: 01/31/2019   Perihilar interstitial infiltrates. Bibasal interstitial infiltrates versus atelectasis. Clinical correlation with pneumonia or fluid overload is helpful. Sunday Corn, MD 01/31/2019 9:46 PM    US Venous Low Extrem Duplx Dopp Comp Bilat   IMPRESSION:      Normal venous duplex of the lower extremities.  No  evidence of intraluminal thrombus  or obstruction to venous flow in right  or left lower extremity.            ASSESSMENT/PLAN   Patient Active Hospital Problem List:   PNA (pneumonia)     Acute COPD exacerbation           [name] is a 48 y.o. female with a PMH of COPD, morbid obesity, tobacco use, CAD , and anxiety admitted for pneumonia and COPD exacerbation.   CTA chest negative for PE. She was recently admitted from 2/12 to 2/22 for acute hypoxic respiratory failure, multilobar pneumonia and COPD exacerbation.      #Pneumonia   #Acute COPD exacerbation  -continue iv solumedrol and tapered to 70m q8h   -ID consulted, continue levaquin and doxycycline  -respiratory viral panel pending   -check sputum cx. Blood cx NGTD   -scheduled duonebs, breo  -supplemental oxygen, on 2L NC.      #CAD  -stable, continue aspirin    #OSA   -on cpap qhs     #Anxiety  -continue risperdal and zoloft      Code: FULL  DVT prophylaxis: heparin sc  Disposition: pending clinical course             Signed,  FAdrienne Mocha          This  chart was generated using hospital voice-recognition software which does not employ spell-checking or grammar-checking features. It was dictated, all or in part, in a busy and often noisy patient care environment. I have taken all usual measures to dictate carefully and to review all aspects this chart. Nonetheless, given the known and well-documented performance characteristics of VR software in such patient care environments, this dictation still may contain unrecognized and wholly unintended errors or omissions

## 2019-02-02 NOTE — Plan of Care (Signed)
Pt alert and oriented ambulates to the bedside commode by herself. Pt still contact isolation to rule out respiratory panel. 2L o2 applied for comfort. Bilateral lower extremity swelling getting better still with +1 pitting edema. According to Dr. Alfredia Ferguson swelling will getter better with pt ambulating other wise pt may need slight diuresis.    Problem: Safety  Goal: Patient will be free from infection during hospitalization  Flowsheets (Taken 02/02/2019 1307)  Free from Infection during hospitalization: Assess and monitor for signs and symptoms of infection; Monitor lab/diagnostic results; Monitor all insertion sites (i.e. indwelling lines, tubes, urinary catheters, and drains); Encourage patient and family to use good hand hygiene technique     Problem: Pain  Goal: Pain at adequate level as identified by patient  Flowsheets (Taken 02/01/2019 0635 by Pincus Sanes, RN)  Pain at adequate level as identified by patient: Identify patient comfort function goal;Assess for risk of opioid induced respiratory depression, including snoring/sleep apnea. Alert healthcare team of risk factors identified.;Assess pain on admission, during daily assessment and/or before any "as needed" intervention(s);Reassess pain within 30-60 minutes of any procedure/intervention, per Pain Assessment, Intervention, Reassessment (AIR) Cycle;Evaluate if patient comfort function goal is met;Evaluate patient's satisfaction with pain management progress;Offer non-pharmacological pain management interventions;Include patient/patient care companion in decisions related to pain management as needed     Problem: Moderate/High Fall Risk Score >5  Goal: Patient will remain free of falls  Flowsheets (Taken 02/02/2019 1307)  High (Greater than 13): HIGH-Consider use of low bed; HIGH-Initiate use of floor mats as appropriate; HIGH-Apply yellow "Fall Risk" arm band     Problem: Compromised Hemodynamic Status  Goal: Vital signs and fluid balance  maintained/improved  Flowsheets (Taken 02/01/2019 1136)  Vital signs and fluid balance are maintained/improved: Position patient for maximum circulation/cardiac output;Monitor/assess vitals and hemodynamic parameters with position changes;Monitor intake and output. Notify LIP if urine output is less than 30 mL/hour.;Monitor/assess lab values and report abnormal values     Problem: Ineffective Airway Clearance  Goal: Airway is maintained  Flowsheets (Taken 02/02/2019 1307)  Airway is maintained : Teach/Reinforce use of incentive spirometer 10 times per hour while awake, cough and deep breath as needed; Maintain oxygen level of greater than 92% or as ordered by LIP (reworded); Reposition patient every 2 hours and as needed unless able to self-reposition

## 2019-02-02 NOTE — Progress Notes (Signed)
Pt ambulates through the floor with a walker and 2L o2. Pt felt short of breath and tolerated fairly.  Pt has limited activity at home as she lives in the 3rd floor of sn apartment and also has a bad knee. Pt waiting  for knee replacement as she needs to loose wait as a requirement.

## 2019-02-03 LAB — GFR: EGFR: >60

## 2019-02-03 LAB — BASIC METABOLIC PANEL
Anion Gap: 9 (ref 5.0–15.0)
BUN: 12 mg/dL (ref 7.0–19.0)
CO2: 19 mEq/L — ABNORMAL LOW (ref 22–29)
Calcium: 9.2 mg/dL (ref 8.5–10.5)
Chloride: 111 mEq/L (ref 100–111)
Creatinine: 0.7 mg/dL (ref 0.6–1.0)
Glucose: 123 mg/dL — ABNORMAL HIGH (ref 70–100)
Potassium: 4.8 mEq/L (ref 3.5–5.1)
Sodium: 139 mEq/L (ref 136–145)

## 2019-02-03 LAB — CBC AND DIFFERENTIAL
Absolute NRBC: 0 10*3/uL (ref 0.00–0.00)
Basophils Absolute Automated: 0.04 10*3/uL (ref 0.00–0.08)
Basophils Automated: 0.3 %
Eosinophils Absolute Automated: 0 10*3/uL (ref 0.00–0.44)
Eosinophils Automated: 0 %
Hematocrit: 40.3 % (ref 34.7–43.7)
Hgb: 12.6 g/dL (ref 11.4–14.8)
Immature Granulocytes Absolute: 0.12 10*3/uL — ABNORMAL HIGH (ref 0.00–0.07)
Immature Granulocytes: 1 %
Lymphocytes Absolute Automated: 2.01 10*3/uL (ref 0.42–3.22)
Lymphocytes Automated: 17.4 %
MCH: 29 pg (ref 25.1–33.5)
MCHC: 31.3 g/dL — ABNORMAL LOW (ref 31.5–35.8)
MCV: 92.6 fL (ref 78.0–96.0)
MPV: 11.1 fL (ref 8.9–12.5)
Monocytes Absolute Automated: 0.45 10*3/uL (ref 0.21–0.85)
Monocytes: 3.9 %
Neutrophils Absolute: 8.9 10*3/uL — ABNORMAL HIGH (ref 1.10–6.33)
Neutrophils: 77.4 %
Nucleated RBC: 0 /100 WBC (ref 0.0–0.0)
Platelets: 258 10*3/uL (ref 142–346)
RBC: 4.35 10*6/uL (ref 3.90–5.10)
RDW: 14 % (ref 11–15)
WBC: 11.52 10*3/uL — ABNORMAL HIGH (ref 3.10–9.50)

## 2019-02-03 LAB — GLUCOSE WHOLE BLOOD - POCT
Whole Blood Glucose POCT: 113 mg/dL — ABNORMAL HIGH (ref 70–100)
Whole Blood Glucose POCT: 86 mg/dL (ref 70–100)
Whole Blood Glucose POCT: 106 mg/dL — ABNORMAL HIGH (ref 70–100)
Whole Blood Glucose POCT: 144 mg/dL — ABNORMAL HIGH (ref 70–100)

## 2019-02-03 LAB — HEMOLYSIS INDEX: Hemolysis Index: 32 — ABNORMAL HIGH (ref 0–18)

## 2019-02-03 MED ORDER — PREDNISONE 20 MG PO TABS
40.0000 mg | ORAL_TABLET | Freq: Every morning | ORAL | Status: DC
Start: 2019-02-03 — End: 2019-02-04
  Administered 2019-02-03 – 2019-02-04 (×2): 40 mg via ORAL
  Filled 2019-02-03 (×2): qty 2

## 2019-02-03 MED ORDER — BENZONATATE 100 MG PO CAPS
200.00 mg | ORAL_CAPSULE | Freq: Three times a day (TID) | ORAL | Status: DC | PRN
Start: 2019-02-03 — End: 2019-02-04
  Administered 2019-02-03 – 2019-02-04 (×2): 200 mg via ORAL
  Filled 2019-02-03 (×4): qty 2

## 2019-02-03 NOTE — Plan of Care (Signed)
Shift note: Patient alert and oriented. Room air, prn 2L oxygen. Vital signs stable. Hourly rounding. BSC to void. No bowel movements today. Reports back/knee pain, chronic. PRN percocet manages pain. Accuchecks ACHS, all WNL, no coverage needed. Tessalon pearls prn for cough. Regular diet.    Sputum culture not previously collected, container given to patient to provide sample.    Will continue to monitor      Problem: Compromised Hemodynamic Status  Goal: Vital signs and fluid balance maintained/improved  Outcome: Progressing  Flowsheets (Taken 02/03/2019 0401 by Janeann Merl, RN)  Vital signs and fluid balance are maintained/improved: Position patient for maximum circulation/cardiac output;Monitor/assess vitals and hemodynamic parameters with position changes;Monitor and compare daily weight;Monitor/assess lab values and report abnormal values;Monitor intake and output. Notify LIP if urine output is less than 30 mL/hour.     Problem: Infection  Goal: Free from infection  Outcome: Progressing  Flowsheets (Taken 02/03/2019 0401 by Janeann Merl, RN)  Free from infection : Assess for signs/symptoms of infection;Utilize isolation precautions per protocol/policy;Utilize sepsis protocol     Problem: Inadequate Tissue Perfusion  Goal: Adequate tissue perfusion will be maintained  Outcome: Progressing  Flowsheets (Taken 02/03/2019 0401 by Janeann Merl, RN)  Adequate tissue perfusion will be maintained: Monitor/assess vital signs;Monitor/assess lab values and report abnormal values;Monitor/assess neurovascular status (pulses, capillary refill, pain, paresthesia, paralysis, presence of edema);Encourage/assist patient as needed to turn, cough, and perform deep breathing every 2 hours;Reinforce use of ordered respiratory interventions (i.e. CPAP, BiPAP, Incentive Spirometer, Acapella, etc.);Perform active/passive ROM;Reinforce ankle pump exercises;Increase mobility as tolerated/progressive mobility;Elevate  feet;Position patient for maximum circulation/cardiac output;Monitor for signs and symptoms of a pulmonary embolism (dyspnea, tachypnea, tachycardia, confusion)

## 2019-02-03 NOTE — OT Eval Note (Signed)
[facility]  Occupational Therapy Evaluation    Patient: [name]  MRN#: [id number]  Unit: [address] [facility]  Bed: D1761/Y0737.A    Time of Evaluation:  Time Calculation  OT Received On: 02/03/19  Start Time: 0813  Stop Time: 0842  Time Calculation (min): 29 min    Chart Review and Collaboration with Care Team: 5 minutes, not included in above time    OT Visit Number: 1    Consult received for [name] for OT Evaluation and Treatment.  Patients medical condition is appropriate for Occupational therapy intervention at this time.    Activity Orders:  OT eval and treat and progressive mobility protocol    Precautions and Contraindications:   Precautions  Weight Bearing Status: no restrictions  Other Precautions: Droplet, falls    Medical Diagnosis: COPD exacerbation [J44.1]  PNA (pneumonia) [J18.9]  Chest pain, unspecified type [R07.9]  Pneumonia due to infectious organism, unspecified laterality, unspecified part of lung [J18.9]    History of Present Illness:  [name] is a 48 y.o. female admitted on 01/31/2019 with COPD exacerbation, ruling out influenza.    Patient Active Problem List   Diagnosis    Acute respiratory failure with hypoxia    Multifocal pneumonia    COPD with acute exacerbation    CAD (coronary artery disease)    Tobacco use    OSA (obstructive sleep apnea)    Anxiety    Overactive bladder    PNA (pneumonia)        Past Medical/Surgical History:  Past Medical History:   Diagnosis Date    Anxiety     Chronic obstructive pulmonary disease     Myocardial infarction     Panic attacks       Past Surgical History:   Procedure Laterality Date    APPENDECTOMY      ARTHROSCOPIC KNEE, ACL RECONSTRUCTION      R. knee     CHOLECYSTECTOMY           X-Rays/Tests/Labs:  Lab Results   Component Value Date/Time    HGB 12.6 02/03/2019 03:08 AM    HCT 40.3 02/03/2019 03:08 AM    K 4.8 02/03/2019 03:08 AM    NA 139 02/03/2019 03:08 AM    INR 0.9  01/31/2019 10:42 PM    TROPI <0.01 01/31/2019 10:42 PM    TROPI <0.01 01/02/2019 08:39 AM    TROPI <0.01 01/02/2019 05:06 AM    TROPI <0.01 01/01/2019 09:16 PM       All imaging reviewed, please see chart for details.    Social History:  Prior Level of Function  Prior level of function: Independent with ADLs, Ambulates with assistive device  Assistive Device: Four wheel walker  Baseline Activity Level: Household ambulation, Community ambulation  Dressing - Upper Body: independent  Dressing - Lower Body: independent  Feeding: independent  Bathing: independent  Grooming: independent  Toileting: independent  DME Currently at Home: ADL- Civil engineer, contracting, Environmental consultant, Four Wheel, Home O2(wears 2L O2 PRN)    Home Living Arrangements  Living Arrangements: Spouse/significant other, Other (Comment)(Pt lives with her fiance and fiance's mother)  Type of Home: Apartment  Home Layout: One level(3rd floor, no elevator)  Bathroom Shower/Tub: Research scientist (physical sciences): Civil engineer, contracting  DME Currently at Home: ADL- Civil engineer, contracting, Environmental consultant, Four Wheel, Home O2(wears 2L O2 PRN)  Home Living - Notes / Comments: Pt with recent hospitalization; discharged home, which pt  reports went smoothly.    Subjective: Subjective: "I walked in the hall yesterday." Patient is agreeable to participation in the therapy session. Nursing clears patient for therapy.   Patient Goal: To sit in chair for breakfast.  Pain Assessment  Pain Assessment: Numeric Scale (0-10)  Pain Score: (Does not quantify pain)  Pain Location: (Chronic back and knee pain)  Pain Intervention(s): Ambulation/increased activity, Emotional support, Distraction      Objective:        Observation of Patient/Vital Signs: BP: 122/80, O2: 96%, HR: 63    Patient received in bed with No Medical Equipment in place.    Cognitive Status and Neuro Exam:  Cognition/Neuro Status  Arousal/Alertness: Appropriate responses to stimuli  Attention Span: Appears intact  Orientation  Level: Oriented X4  Memory: Appears intact  Following Commands: independent  Safety Awareness: independent  Insights: Fully aware of deficits  Problem Solving: Able to problem solve independently  Behavior: attentive, calm, cooperative  Motor Planning: intact  Coordination: intact  Neuro Status  Behavior: attentive, calm, cooperative  Motor Planning: intact  Coordination: intact       Musculoskeletal Examination  Gross ROM  Right Upper Extremity ROM: within functional limits  Left Upper Extremity ROM: within functional limits  Gross Strength  Right Upper Extremity Strength: within functional limits  Left Upper Extremity Strength: within functional limits       Sensory/Oculomotor Examination  Sensory  Auditory: intact  Visual Acuity: intact, wears glasses         Activities of Daily Living  Self-care and Home Management  Eating: Independent  Toileting: Independent    Functional Mobility:  Mobility and Transfers  Supine to Sit: Independent  Sit to Stand: Independent  Bed to Chair: Independent  Bed to [facility]: Independent  Functional Mobility/Ambulation: Independent(10 steps, no AD)   Pt has been up ambulating in hallway with staff.     Balance  Balance  Static Sitting Balance: Independent  Dyanamic Sitting Balance: Independent  Static Standing Balance: Independent  Dynamic Standing Balance: Independent    Participation and Activity Tolerance  Participation and Endurance  Participation Effort: excellent  Endurance: Tolerates < 10 min exercise, no significant change in vital signs    Educated the patient to role of occupational therapy, plan of care, goals of therapy and safety with mobility and ADLs, energy conservation techniques, discharge instructions, home safety.    Patient left in bedside chair with breakfast tray, call bell and all personal items/needs within reach. RN notified of session outcome.       Assessment:  [name] is a 49 y.o. female admitted 01/31/2019. Patient is at baseline and has no inpatient  skilled OT needs at this time.     A Brief chart review was completed including review of labs, review of imaging, review of vitals and review of past hospitalizations.  There are few comorbidities or other factors that affect plan of care and require modification of task including: morbid obesity, COPD, anxiety, personal factors including:, assistive device at baseline, challenging home environment .  Pt does not demonstrates performance deficits.   Pt's ability to complete ADLs and functional transfers is not impaired at this time.    Complexity Chart  Review Performance  Deficits Clinical Decision  Making Hx/Co-  morbidities Assistance needed   Low Brief 1-3 Limited options None None (or at baseline)       Treatment  - OT facilitated supine to sit, transfer to bedside chair; pt independently completed transfer to  BSC while OT out of room to use the bathroom. Discussed use of assistive device; for short distances pt with good tolerance and balance without device.  - Discussed adaptive equipment to increase independence and safety with lower body ADLs, as pt reports getting socks on and off can be a challenge.  Pt declined any additional training, but aware of sock aid if she decides she would like one.  - Discussed discharge plan.  Pt planning to return home.  No additional acute OT needs identified, and all pt's concerns and questions addressed.    Plan:  D/C OT at this time.    PMP Activity: Step 6 - Walks in Room  Distance Walked (ft) (Step 6,7): 10 Feet    AM-PAC:yes  OT Daily Activity Raw Score: 24  CMS 0-100% Score: 0.00%               Goals:  N/A        DME Recommended for Discharge: (DME already in place)  Discharge Recommendation: Home with no needs       Iva Lento & W [phone]  [phone]    02/03/2019  8:55 AM

## 2019-02-03 NOTE — Plan of Care (Signed)
Pt alert and oriented x4, resting in bed, VSS on 2l/nc. Neb treatment in place. SCD's on bilaterally. Call light and personal belongings within reach. Bed in low position, bed alarm on. Rounding in place for safety. Will continue to monitor.    Problem: Safety  Goal: Patient will be free from injury during hospitalization  Outcome: Progressing  Flowsheets (Taken 02/03/2019 0401)  Patient will be free from injury during hospitalization : Assess patient's risk for falls and implement fall prevention plan of care per policy; Provide and maintain safe environment; Use appropriate transfer methods; Ensure appropriate safety devices are available at the bedside; Include patient/ family/ care giver in decisions related to safety     Problem: Pain  Goal: Pain at adequate level as identified by patient  Outcome: Progressing  Flowsheets (Taken 02/03/2019 0401)  Pain at adequate level as identified by patient: Identify patient comfort function goal; Assess pain on admission, during daily assessment and/or before any "as needed" intervention(s); Reassess pain within 30-60 minutes of any procedure/intervention,  Evaluate if patient comfort function goal is met; Evaluate patient's satisfaction with pain management progress; Offer non-pharmacological pain management interventions     Problem: Compromised Hemodynamic Status  Goal: Vital signs and fluid balance maintained/improved  Outcome: Progressing  Flowsheets (Taken 02/03/2019 0401)  Vital signs and fluid balance are maintained/improved: Position patient for maximum circulation/cardiac output; Monitor/assess vitals and hemodynamic parameters with position changes; Monitor and compare daily weight; Monitor/assess lab values and report abnormal values; Monitor intake and output. Notify LIP if urine output is less than 30 mL/hour.     Problem: Infection  Goal: Free from infection  Outcome: Progressing  Flowsheets (Taken 02/03/2019 0401)  Free from infection : Assess for  signs/symptoms of infection; Utilize isolation precautions per protocol/policy; Utilize sepsis protocol     Problem: Inadequate Tissue Perfusion  Goal: Adequate tissue perfusion will be maintained  Outcome: Progressing  Flowsheets (Taken 02/03/2019 0401)  Adequate tissue perfusion will be maintained: Monitor/assess vital signs; Monitor/assess lab values and report abnormal values; Encourage/assist patient as needed to turn, cough, and perform deep breathing every 2 hours; Reinforce use of ordered respiratory interventions (i.e. CPAP, BiPAP, Incentive Spirometer,); Perform active/passive ROM; Reinforce ankle pump exercises; Increase mobility as tolerated/progressive mobility; Elevate feet; Position patient for maximum circulation/cardiac output;       Problem: Inadequate Gas Exchange  Goal: Patent Airway maintained  Outcome: Progressing  Flowsheets (Taken 02/03/2019 0401)  Patent airway maintained : Position patient for maximum ventilatory efficiency; Provide adequate fluid intake to liquefy secretions; Reinforce use of ordered respiratory interventions (i.e. CPAP, BiPAP, Incentive Spirometer, Acapella, etc.)

## 2019-02-03 NOTE — Progress Notes (Signed)
Infectious Diseases & Tropical Medicine  Progress Note    02/03/2019   [name] OLI:[id number],ILN:[id number] is a 48 y.o. female,       Assessment:      COPD exacerbation/pneumonitis   Procalcitonin level-0.04 (02/02/2019)   Blood cultures no growth to date   Sleep apnea   Gastroesophageal reflux disease   Heavy smoking   Morbid obesity   Leukocytosis-on steroids   Clinically improving    Plan:      Continue doxycycline and Levaquin x5 days   Continue nebs and steroids   Sputum culture   Follow-up chest x-ray   Continue probiotics   Monitor electrolytes and renal functions closely   Monitor clinically   Discussed with patient in detail         ROS:     General:  no fever, no chills, no rigor, awake and alert,comfortable  HEENT: no neck pain, no throat pain  Endocrine:  no fatigue, no night sweats  Respiratory: Cough and shortness of breath improving  Cardiovascular: no chest pain   Gastrointestinal: no abdominal pain,no N/V/D  Genito-Urinary: no dysuria, increased urinary frequency, trouble voiding, or hematuria   Musculoskeletal: Complains of swelling of both lower legs  Neurological: c/o generalized weakness   Dermatological: no rash, no ulcer    Physical Examination:     Blood pressure 118/68, pulse 62, temperature 98.2 F (36.8 C), temperature source Oral, resp. rate 16, height 1.753 m (_0 ), weight 152 kg (335 lb 1.6 oz), SpO2 94 %.     General Appearance: Comfortable, and in no acute distress.   HEENT: Pupils are equal, round, and reactive to light.    Lungs: Decreased breath sounds   Heart:  Regular rate and rhythm   Chest: Symmetric chest wall expansion.    Abdomen: soft ,non tender,no hepatosplenomegaly,good bowel sounds   Neurological: No focal deficit   Extremities: Trace edema    Laboratory And Diagnostic Studies:     Recent Labs     02/03/19  0308 02/02/19  1109   WBC 11.52* 11.17*   Hgb 12.6 12.6   Hematocrit 40.3 39.4   Platelets 258 255     Recent Labs      02/03/19  0308 02/02/19  1109   Sodium 139 137   Potassium 4.8 4.4   Chloride 111 110   CO2 19* 20*   BUN 12.0 8.0   Creatinine 0.7 0.9   Glucose 123* 279*   Calcium 9.2 9.3     Recent Labs     01/31/19  2242   AST (SGOT) 10   ALT 10   Alkaline Phosphatase 95   Protein, Total 5.7*   Albumin 3.0*       Current Meds:      Scheduled Meds: PRN Meds:    albuterol-ipratropium, 3 mL, Nebulization, Q6H SCH  aspirin, 81 mg, Oral, Daily  doxycycline, 100 mg, Oral, Q12H SCH  famotidine, 20 mg, Oral, Q12H SCH  fluticasone furoate-vilanterol, 1 puff, Inhalation, QAM  gabapentin, 400 mg, Oral, TID  heparin (porcine), 5,000 Units, Subcutaneous, Q8H SCH  insulin lispro, 1-8 Units, Subcutaneous, TID AC  lactobacillus/streptococcus, 1 capsule, Oral, Daily  levoFLOXacin, 500 mg, Oral, Daily  nicotine, 1 patch, Transdermal, Daily  oxybutynin, 5 mg, Oral, TID  risperiDONE, 3 mg, Oral, Q12H  sertraline, 100 mg, Oral, Q12H        Continuous Infusions:   acetaminophen, 650 mg, Q4H PRN    Or  acetaminophen, 650 mg, Q4H PRN  acetaminophen, 650 mg, Once PRN  albuterol-ipratropium, 3 mL, Q4H PRN  ALPRAZolam, 0.5 mg, BID PRN  guaiFENesin, 200 mg, Q4H PRN  naloxone, 0.2 mg, PRN  ondansetron, 4 mg, Once PRN  ondansetron, 4 mg, Q6H PRN    Or  ondansetron, 4 mg, Q6H PRN  oxyCODONE-acetaminophen, 2 tablet, Q4H PRN          [name], M.D.  02/03/2019  9:16 AM

## 2019-02-03 NOTE — Progress Notes (Addendum)
[facility]  PROGRESS NOTE      Patient: [name]  Date: 02/03/2019   LOS: 1 Days  Admission Date: 01/31/2019   MRN: [id number]  Attending: Ardeth Sportsman  Please contact me on [employer] Serve      ASSESSMENT/PLAN     [name] is a 48 y.o. female admitted with <principal problem not specified>    Interval Summary:     Patient Active Hospital Problem List:  [name] is a 48 y.o. female with a PMH of COPD, morbid obesity, tobacco use, CAD , and anxiety admitted for pneumonia and COPD exacerbation.   CTA chest negative for PE. She was recently admitted from 2/12 to 2/22 for acute hypoxic respiratory failure, multilobar pneumonia and COPD exacerbation.    #Pneumonia   #Acute COPD exacerbation  - continues to have sx of cp and sob upon ambulation. Cp likely costochondritis due to coughing. Will order tessalon perls for relief.   - start prednisone 49m daily (was on solumedrol)  -ID consulted, continue levaquin and doxycycline  -respiratory viral panel pending   -Blood cx NGTD   -scheduled duonebs, breo  -pt has been weaned off of oxygen and doing well on room air     #CAD  -stable, continue aspirin    #OSA   -on cpap qhs     #Anxiety  -continue risperdal and zoloft      #Obesity   - suspect sleep apnea. Recommend op sleep study.     Code: FULL  DVT prophylaxis: heparin sc  Disposition: pending clinical course     Code Status: Full  DISPO: TBD     SUBJECTIVE     DWeston BrassBuckler reports cp when coughing. Reports sob upon movement. Denies nausea/vomiting.     MEDICATIONS     Current Facility-Administered Medications   Medication Dose Route Frequency    albuterol-ipratropium  3 mL Nebulization Q6H SCH    aspirin  81 mg Oral Daily    doxycycline  100 mg Oral Q12H SCH    famotidine  20 mg Oral Q12H SCH    fluticasone furoate-vilanterol  1 puff Inhalation QAM    gabapentin  400 mg Oral TID    heparin (porcine)  5,000 Units Subcutaneous Q8H [place]   insulin lispro  1-8 Units  Subcutaneous TID AC    lactobacillus/streptococcus  1 capsule Oral Daily    levoFLOXacin  500 mg Oral Daily    nicotine  1 patch Transdermal Daily    oxybutynin  5 mg Oral TID    risperiDONE  3 mg Oral Q12H    sertraline  100 mg Oral Q12H       PHYSICAL EXAM     Vitals:    02/03/19 1451   BP:    Pulse:    Resp:    Temp:    SpO2: 98%       Temperature: Temp  Min: 97.9 F (36.6 C)  Max: 98.4 F (36.9 C)  Pulse: Pulse  Min: 62  Max: 90  Respiratory: Resp  Min: 16  Max: 20  Non-Invasive BP: BP  Min: 100/61  Max: 119/76  Pulse Oximetry SpO2  Min: 94 %  Max: 98 %    Intake and Output Summary (Last 24 hours) at Date Time    Intake/Output Summary (Last 24 hours) at 02/03/2019 1456  Last data filed at 02/03/2019 1000  Gross per 24 hour   Intake 720 ml   Output --  Net 720 ml       GEN APPEARANCE: Normal;  A&OX3  HEENT: PERLA; EOMI; Conjunctiva Clear  NECK: Supple; No bruits  CVS: RRR, S1, S2; No M/G/R  LUNGS: CTAB; No Wheezes; No Rhonchi: No rales  ABD: Soft; No TTP; + Normoactive BS  EXT: No edema; Pulses 2+ and intact  Skin exam:  pink  NEURO: non focal, moves all extremities   CAP REFILL:  Normal  MENTAL STATUS:  Normal    Exam done by Ardeth Sportsman, MD on 02/03/19 at 2:56 PM      LABS     Recent Labs   Lab 02/03/19  0308 02/02/19  1109 02/01/19  0917   WBC 11.52* 11.17* 6.11   RBC 4.35 4.32 4.48   Hgb 12.6 12.6 12.9   Hematocrit 40.3 39.4 40.5   MCV 92.6 91.2 90.4   Platelets 258 255 237       Recent Labs   Lab 02/03/19  0308 02/02/19  1109 01/31/19  2242   Sodium 139 137 139   Potassium 4.8 4.4 4.2   Chloride 111 110 109   CO2 19* 20* 23   BUN 12.0 8.0 8.0   Creatinine 0.7 0.9 0.8   Glucose 123* 279* 80   Calcium 9.2 9.3 9.1       Recent Labs   Lab 01/31/19  2242   ALT 10   AST (SGOT) 10   Bilirubin, Total 0.3   Albumin 3.0*   Alkaline Phosphatase 95       Recent Labs   Lab 01/31/19  2242   Troponin I <0.01       Recent Labs   Lab 01/31/19  2242   PT INR 0.9   PT 12.5*   PTT 26       Microbiology Results      Procedure Component Value Units Date/Time    Blood Culture Aerobic/Anaerobic #1 [[id number]] Collected:  01/31/19 2242    Specimen:  Arm from Blood, Venipuncture Updated:  02/03/19 0821    Narrative:       ORDER#: [id number]                                    ORDERED BY: Vista Lawman  SOURCE: Blood, Venipuncture arm                      COLLECTED:  01/31/19 22:42  ANTIBIOTICS AT COLL.:                                RECEIVED :  02/01/19 08:14  Culture Blood Aerobic and Anaerobic        PRELIM      02/03/19 08:21  02/02/19   No Growth after 1 day/s of incubation.  02/03/19   No Growth after 2 day/s of incubation.      Blood Culture Aerobic/Anaerobic #2 [[id number]] Collected:  01/31/19 2242    Specimen:  Arm from Blood, Venipuncture Updated:  02/03/19 0821    Narrative:       ORDER#: [id number]                                    ORDERED BY: Vista Lawman  SOURCE: Blood, Venipuncture LUE  COLLECTED:  01/31/19 22:42  ANTIBIOTICS AT COLL.:                                RECEIVED :  02/01/19 08:14  Culture Blood Aerobic and Anaerobic        PRELIM      02/03/19 08:21  02/02/19   No Growth after 1 day/s of incubation.  02/03/19   No Growth after 2 day/s of incubation.      Rapid influenza A/B antigens [[id number]] Collected:  01/31/19 2242    Specimen:  Nasopharyngeal from Nasal Aspirate Updated:  01/31/19 2333    Narrative:       ORDER#: [id number]                                    ORDERED BY: Vista Lawman  SOURCE: Nasal Aspirate                               COLLECTED:  01/31/19 22:42  ANTIBIOTICS AT COLL.:                                RECEIVED :  01/31/19 23:11  Influenza Rapid Antigen A&B                FINAL       01/31/19 23:33  01/31/19   Negative for Influenza A and B             Reference Range: Negative      Respiratory Pathogen Panel, PCR (Film Array) [[id number]] Collected:  02/01/19 1726     Updated:  02/02/19 0140    Narrative:       Viral Transport Media (VTM)           RADIOLOGY      Hip Right Ap And Lateral With Pelvis    Result Date: 02/01/2019  Right more severe than left hip osteoarthrosis. Guy Sandifer, MD 02/01/2019 3:08 AM    Ct Angio Chest (pe Study)    Result Date: 02/01/2019  No pulmonary embolism. Improving pneumonitis. Guy Sandifer, MD 02/01/2019 1:18 AM    Chest Ap Portable    Result Date: 01/31/2019   Perihilar interstitial infiltrates. Bibasal interstitial infiltrates versus atelectasis. Clinical correlation with pneumonia or fluid overload is helpful. Sunday Corn, MD 01/31/2019 9:46 PM    US Venous Low Extrem Duplx Dopp Comp Bilat    Result Date: 02/02/2019     Normal venous duplex of the lower extremities.  No evidence of intraluminal thrombus or obstruction to venous flow in right or left lower extremity. Dimitrios  Papadouris, MD 02/02/2019 7:20 AM        Signed,  Rollen Sox Urban Naval  2:56 PM 02/03/2019

## 2019-02-03 NOTE — Plan of Care (Signed)
Discharge Recommendation: Home with no needs   DME Recommended for Discharge: (DME already in place)    OT Frequency Recommended: one time visit - therapy discontinued     Next Visit Recommendation:         Is PT evaluation indicated at this time? No, the patient does not require a PT evaluation.    PMP Activity: Step 6 - Walks in Room  Distance Walked (ft) (Step 6,7): 10 Feet  (Please See Therapy Evaluation for device and assistance level needed)    Treatment Interventions: No skilled interventions needed at this time              Iva Lento & W [id number]  [phone]    02/03/2019  9:00 AM

## 2019-02-03 NOTE — UM Notes (Signed)
Primary Payor: MEDICAID [employer]   02/01/19 0258  Place (admit) for Observation         Upgraded to   02/02/19 1323  Admit to Inpatient        Final diagnoses   Pneumonia due to infectious organism, unspecified laterality, unspecified part of lung        Chest pain, unspecified type        COPD exacerbation        PNA (pneumonia)        48 y.o. female presenting to the ED with history of MI coronary artery disease COPD recently quit smoking and discharged from the hospital with multifocal pneumonia last month presents with shortness of breath, dyspnea on exertion and chest pain.  Patient states that she also had leg swelling.  She went to her doctors today she was having shortness of breath.  She is supposed to be on 2 L of oxygen at home chronically for her chronic respiratory failure from her COPD however she does not have it with her at this time     98.7 F (37.1 C) Oral 50 95 % -- 23 105/53      Protein, Total 5.7 g/dL      Albumin 3.0 g/dL      PT 12.5 sec       PT INR 0.9        Hip Right AP and Lateral with Pelvis (Final result)   Result time 02/01/19 03:08:56   Final result by Madeline Bandy, MD (02/01/19 03:08:56)                Impression:      Right more severe than left hip osteoarthrosis.                CT Angio Chest (PE study) (Final result)   Result time 02/01/19 01:18:49   Final result by Madeline Bandy, MD (02/01/19 01:18:49)                Impression:      No pulmonary embolism.     Improving pneumonitis.              Chest AP Portable (Final result)   Result time 01/31/19 21:46:03   Final result by [name] Cd, MD (01/31/19 21:46:03)                Impression:    Perihilar interstitial infiltrates. Bibasal interstitial  infiltrates versus atelectasis. Clinical correlation with pneumonia or  fluid overload is helpful.                Medication Administration from 01/31/2019 2058 to 02/01/2019 0414      Date/Time Order Dose Route Action Action by Comments     01/31/2019 2246 aspirin tablet 325 mg 325 mg Oral Given Madeline Levy, RN     01/31/2019 2254 fentaNYL (PF) (SUBLIMAZE) injection 50 mcg 50 mcg Intravenous Given Madeline Levy, RN needed sonosite for IV    01/31/2019 2254 methylPREDNISolone sodium succinate (Solu-MEDROL) injection 60 mg 60 mg Intravenous Given Madeline Levy, RN needed IV access    01/31/2019 2307 magnesium sulfate 1g in dextrose 5% 184m IVPB (premix) 0 g Intravenous SCelesta Aver RN     01/31/2019 2252 magnesium sulfate 1g in dextrose 5% 1039mIVPB (premix) 1 g Intravenous New Bag Youssef, Angy, RN needed sonosite    01/31/2019 2210 albuterol-ipratropium (DUO-NEB) 2.5-0.5(3) mg/3 mL nebulizer 3 mL 3 mL Nebulization Given [name]  B, RT     01/31/2019 2339 lidocaine viscous (XYLOCAINE) 2 % solution 10 mL 10 mL Mouth/Throat Given Madeline Levy, RN     01/31/2019 2339 alum & mag hydroxide-simethicone (MAALOX PLUS) 200-200-20 mg/5 mL suspension 30 mL 30 mL Oral Given Madeline Levy, RN     02/01/2019 0050 iohexol (OMNIPAQUE) 350 MG/ML injection 115 mL 115 mL Intravenous Imaging Agent Given [name]     02/01/2019 0208 vancomycin (VANCOCIN) 2,000 mg in sodium chloride 0.9 % 500 mL IVPB 2,000 mg Intravenous [address] Madeline Levy, RN     02/01/2019 0156 piperacillin-tazobactam (ZOSYN) 4.5 g in sodium chloride 0.9 % 100 mL IVPB mini-bag plus 0 g Intravenous Madeline Aver, RN     02/01/2019 0126 piperacillin-tazobactam (ZOSYN) 4.5 g in sodium chloride 0.9 % 100 mL IVPB mini-bag plus 4.5 g Intravenous [id number] Madeline Levy, RN     02/01/2019 0126 fentaNYL (PF) (SUBLIMAZE) injection 50 mcg 50 mcg Intravenous Given Youssef, Angy, RN        H&P;  ASSESSMENT & PLAN   Exacerbation COPD  - iv abx, iv steroid, nebs, pulse ox monitoring  - f/u procalcitonin    GERD  - cont Gi prophylaxy    Tobacco abuse   - cont' nicotine patch    OSA  -  cpap overnight     Anxiety  - cont' home meds     Obesity  - diet and exercise consealed      Anticipated medical stability for discharge: per medical improvement    Service status/Reason for ongoing hospitalization: COPD exacerbation       48 y.o.femalewith [name] PMH of COPD,morbid obesity,tobacco use, CAD , and anxiety admitted for pneumonia and COPD exacerbation.   CTA chest negative for PE. She was recently admitted from 2/12 to 2/22 for acute hypoxic respiratory failure, multilobarpneumoniaandCOPD exacerbation.    #Pneumonia   #Acute COPD exacerbation  - continues to have sx of cp and sob upon ambulation. Cp likely costochondritis due to coughing. Will order tessalon perls for relief.   -continue iv solumedrol and tapered to 17m q8h  -ID consulted,continuelevaquin and doxycycline  -respiratory viral panel pending   -Blood cx NGTD  -scheduled duonebs, breo  -pt has been weaned off of oxygen and doing well on room air     #CAD  -stable, continue aspirin    #OSA   -on cpap qhs     #Anxiety  -continue risperdal and zoloft     #Obesity   - suspect sleep apnea. Recommend op sleep study.     Code: FULL  DVT prophylaxis: heparin sc  Disposition: pending clinical course     Code Status: Full  DISPO: TBD     SUBJECTIVE     DWeston [name] reports cp when coughing. Reports sob upon movement. Denies nausea/vomiting.     MEDICATIONS            Current Facility-Administered Medications   Medication Dose Route Frequency    albuterol-ipratropium  3 mL Nebulization Q6H [place]   aspirin  81 mg Oral Daily    doxycycline  100 mg Oral Q12H SCH    famotidine  20 mg Oral Q12H SCH    fluticasone furoate-vilanterol  1 puff Inhalation QAM    gabapentin  400 mg Oral TID    heparin (porcine)  5,000 Units Subcutaneous Q8H [place]   insulin lispro  1-8 Units Subcutaneous TID AC    lactobacillus/streptococcus  1 capsule Oral Daily  levoFLOXacin  500 mg Oral Daily    nicotine  1 patch Transdermal Daily    oxybutynin  5 mg Oral TID    risperiDONE  3 mg Oral Q12H    sertraline  100 mg Oral Q12H    methylPREDNISolone sodium succinate (Solu-MEDROL) injection 60 mg   Dose: 60 mg  Freq: Every 8 hours Route: IV  Start: 02/01/19 0700 End: 02/02/19     methylPREDNISolone sodium succinate (Solu-MEDROL) injection 40 mg   Dose: 40 mg  Freq: Every 8 hours Route: IV  Start: 02/02/19    oxyCODONE-acetaminophen (PERCOCET) 5-325 MG per tablet 2 tablet   Dose: 2 tablet  Freq: Every 4 hours PRN Route: PO  PRN Reason: severe pain x6/24hrs    Recent Labs   Lab 02/03/19  0308 02/02/19  1109 02/01/19  0917   WBC 11.52* 11.17* 6.11       Recent Labs   Lab 02/03/19  0308 02/02/19  1109 01/31/19  2242   Sodium 139 137 139   Potassium 4.8 4.4 4.2   Chloride 111 110 109   CO2 19* 20* 23   BUN 12.0 8.0 8.0   Creatinine 0.7 0.9 0.8   Glucose 123* 279* 80     Recent Labs   Lab 01/31/19  2242   ALT 10   AST (SGOT) 10   Bilirubin, Total 0.3   Albumin 3.0*       02/03/19 03:02:49  97.9 F (36.6 C)    71  20  118/73  94 % 1 L/min         Marciano Sequin, RN, BSN, Danaher [name], CCM  Utilization Review Nurse  [facility]  [phone] [facility] Staff ONLY)  [phone] (Carriers)    Please submit all clinical review requests via fax to [phone]

## 2019-02-04 LAB — BASIC METABOLIC PANEL
Anion Gap: 6 (ref 5.0–15.0)
BUN: 13 mg/dL (ref 7.0–19.0)
CO2: 23 mEq/L (ref 22–29)
Calcium: 9.3 mg/dL (ref 8.5–10.5)
Chloride: 109 mEq/L (ref 100–111)
Creatinine: 0.7 mg/dL (ref 0.6–1.0)
Glucose: 128 mg/dL — ABNORMAL HIGH (ref 70–100)
Potassium: 4.8 mEq/L (ref 3.5–5.1)
Sodium: 138 mEq/L (ref 136–145)

## 2019-02-04 LAB — CBC AND DIFFERENTIAL
Absolute NRBC: 0 10*3/uL (ref 0.00–0.00)
Basophils Absolute Automated: 0.07 10*3/uL (ref 0.00–0.08)
Basophils Automated: 0.7 %
Eosinophils Absolute Automated: 0.03 10*3/uL (ref 0.00–0.44)
Eosinophils Automated: 0.3 %
Hematocrit: 39.1 % (ref 34.7–43.7)
Hgb: 12.3 g/dL (ref 11.4–14.8)
Immature Granulocytes Absolute: 0.12 10*3/uL — ABNORMAL HIGH (ref 0.00–0.07)
Immature Granulocytes: 1.2 %
Lymphocytes Absolute Automated: 2.04 10*3/uL (ref 0.42–3.22)
Lymphocytes Automated: 20.8 %
MCH: 28.9 pg (ref 25.1–33.5)
MCHC: 31.5 g/dL (ref 31.5–35.8)
MCV: 92 fL (ref 78.0–96.0)
MPV: 10.4 fL (ref 8.9–12.5)
Monocytes Absolute Automated: 0.6 10*3/uL (ref 0.21–0.85)
Monocytes: 6.1 %
Neutrophils Absolute: 6.96 10*3/uL — ABNORMAL HIGH (ref 1.10–6.33)
Neutrophils: 70.9 %
Nucleated RBC: 0 /100 WBC (ref 0.0–0.0)
Platelets: 277 10*3/uL (ref 142–346)
RBC: 4.25 10*6/uL (ref 3.90–5.10)
RDW: 14 % (ref 11–15)
WBC: 9.82 10*3/uL — ABNORMAL HIGH (ref 3.10–9.50)

## 2019-02-04 LAB — RESPIRATORY PATHOGEN PANEL, PCR (FILMARRAY) (SOFT)
Adenovirus: NOT DETECTED
Bordetella pertussis: NOT DETECTED
Chlamydophila pneumoniae: NOT DETECTED
Coronavirus 229E: NOT DETECTED
Coronavirus HKU1: NOT DETECTED
Coronavirus NL63: NOT DETECTED
Coronavirus OC43: NOT DETECTED
Human Metapneumovirus: NOT DETECTED
Human Rhinovirus/Enterovirus: NOT DETECTED
Influenza A/H1: NOT DETECTED
Influenza A/H3: NOT DETECTED
Influenza A: NOT DETECTED
Influenza AH1 - 2009: NOT DETECTED
Influenza B: NOT DETECTED
Mycoplasma pneumoniae: NOT DETECTED
Parainfluenza Virus 1: NOT DETECTED
Parainfluenza Virus 2: NOT DETECTED
Parainfluenza Virus 3: NOT DETECTED
Parainfluenza Virus 4: NOT DETECTED
Respiratory Syncytial Virus: NOT DETECTED

## 2019-02-04 LAB — GLUCOSE WHOLE BLOOD - POCT
Whole Blood Glucose POCT: 126 mg/dL — ABNORMAL HIGH (ref 70–100)
Whole Blood Glucose POCT: 178 mg/dL — ABNORMAL HIGH (ref 70–100)

## 2019-02-04 LAB — GFR: EGFR: >60

## 2019-02-04 LAB — HEMOLYSIS INDEX: Hemolysis Index: 9 (ref 0–18)

## 2019-02-04 MED ORDER — FAMOTIDINE 20 MG PO TABS
20.0000 mg | ORAL_TABLET | Freq: Two times a day (BID) | ORAL | 0 refills | Status: DC
Start: 2019-02-04 — End: 2021-03-23

## 2019-02-04 MED ORDER — PREDNISONE 10 MG PO TABS
ORAL_TABLET | ORAL | 0 refills | Status: AC
Start: 2019-02-04 — End: 2019-02-12

## 2019-02-04 MED ORDER — FLUTICASONE-SALMETEROL 500-50 MCG/DOSE IN AEPB
1.00 | INHALATION_SPRAY | Freq: Two times a day (BID) | RESPIRATORY_TRACT | 0 refills | Status: AC
Start: 2019-02-04 — End: ?

## 2019-02-04 MED ORDER — BENZONATATE 200 MG PO CAPS
200.00 mg | ORAL_CAPSULE | Freq: Three times a day (TID) | ORAL | 0 refills | Status: DC | PRN
Start: 2019-02-04 — End: 2020-10-09

## 2019-02-04 MED ORDER — OXYCODONE-ACETAMINOPHEN 10-325 MG PO TABS
1.0000 | ORAL_TABLET | ORAL | 0 refills | Status: DC | PRN
Start: 2019-02-04 — End: 2019-11-29

## 2019-02-04 MED ORDER — NALOXONE HCL 4 MG/0.1ML NA LIQD
NASAL | 0 refills | Status: DC
Start: 2019-02-04 — End: 2020-10-09

## 2019-02-04 MED ORDER — DOXYCYCLINE MONOHYDRATE 100 MG PO CAPS
100.00 mg | ORAL_CAPSULE | Freq: Two times a day (BID) | ORAL | 0 refills | Status: AC
Start: 2019-02-04 — End: 2019-02-09

## 2019-02-04 MED ORDER — LEVOFLOXACIN 500 MG PO TABS
500.00 mg | ORAL_TABLET | Freq: Every day | ORAL | 0 refills | Status: AC
Start: 2019-02-05 — End: 2019-02-10

## 2019-02-04 MED ORDER — ALBUTEROL SULFATE HFA 108 (90 BASE) MCG/ACT IN AERS
2.0000 | INHALATION_SPRAY | RESPIRATORY_TRACT | 0 refills | Status: AC | PRN
Start: 2019-02-04 — End: ?

## 2019-02-04 MED ORDER — GUAIFENESIN ER 600 MG PO TB12
1200.00 mg | ORAL_TABLET | Freq: Two times a day (BID) | ORAL | 0 refills | Status: DC
Start: 2019-02-04 — End: 2020-10-09

## 2019-02-04 MED ORDER — RISAQUAD PO CAPS
1.00 | ORAL_CAPSULE | Freq: Every day | ORAL | 0 refills | Status: DC
Start: 2019-02-05 — End: 2020-10-07

## 2019-02-04 NOTE — Progress Notes (Addendum)
DISCHARGE: Patient will discharge to home today with no needs.  PTS wheelchair Lucianne Lei transport pickup: 1:00PM.       02/04/19 1106   Discharge Disposition   Patient preference/choice provided? Yes   Physical Discharge Disposition Home   Mode of Transportation Wheelchair [place]   Patient/Family/POA notified of transfer plan Yes   Patient agreeable to discharge plan/expected d/c date? Yes   Family/POA agreeable to discharge plan/expected d/c date? Yes   Bedside nurse notified of transport plan? Yes   CM Interventions   Follow up appointment scheduled? No   Reason no follow up scheduled? Other (comment)  (Patient will schedule and attend as appropriate.)   Multidisciplinary rounds/family meeting before d/c? Yes   Medicare Checklist   Is this a Medicare patient? No     Memory Argue, MSW  Social Worker Case Manager [facility]  [phone]

## 2019-02-04 NOTE — Progress Notes (Signed)
Infectious Diseases & Tropical Medicine  Progress Note    02/04/2019   [name] KDX:[id number],[id number] is a 48 y.o. female,       Assessment:      COPD exacerbation/pneumonitis   Procalcitonin level-0.04 (02/02/2019)   Blood cultures no growth to date   Sleep apnea   Gastroesophageal reflux disease   Heavy smoking   Morbid obesity   No fever or leukocytosis   Clinically stable    Plan:      Continue doxycycline and Levaquin x4 more days   Continue nebs and steroids   Await pending sputum cultures   Follow-up chest x-ray   Continue probiotics   Monitor electrolytes and renal functions closely   Monitor clinically   Discussed with patient in detail         ROS:     General:  no fever, no chills, no rigor, awake and alert,comfortable, sitting in the chair eating  HEENT: no neck pain, no throat pain  Endocrine:  no fatigue, no night sweats  Respiratory: Cough and shortness of breath improving  Cardiovascular: no chest pain   Gastrointestinal: no abdominal pain,no N/V/D  Genito-Urinary: no dysuria, increased urinary frequency, trouble voiding, or hematuria   Musculoskeletal: Complains of swelling of both lower legs  Neurological: c/o generalized weakness   Dermatological: no rash, no ulcer    Physical Examination:     Blood pressure 125/86, pulse 68, temperature 98.2 F (36.8 C), temperature source Oral, resp. rate 18, height 1.753 m (_0 ), weight 152 kg (335 lb 1.6 oz), SpO2 95 %.     General Appearance: Comfortable, and in no acute distress.   HEENT: Pupils are equal, round, and reactive to light.    Lungs: Few scattered bibasilar crackles   Heart:  Regular rate and rhythm   Chest: Symmetric chest wall expansion.    Abdomen: soft ,non tender,no hepatosplenomegaly,good bowel sounds   Neurological: No focal deficit   Extremities: Trace edema    Laboratory And Diagnostic Studies:     Recent Labs     02/04/19  0343 02/03/19  0308   WBC 9.82* 11.52*   Hgb 12.3 12.6   Hematocrit 39.1  40.3   Platelets 277 258     Recent Labs     02/04/19  0343 02/03/19  0308   Sodium 138 139   Potassium 4.8 4.8   Chloride 109 111   CO2 23 19*   BUN 13.0 12.0   Creatinine 0.7 0.7   Glucose 128* 123*   Calcium 9.3 9.2     No results for input(s): AST, ALT, ALKPHOS, PROT, ALB in the last 72 hours.    Current Meds:      Scheduled Meds: PRN Meds:    albuterol-ipratropium, 3 mL, Nebulization, Q6H SCH  aspirin, 81 mg, Oral, Daily  doxycycline, 100 mg, Oral, Q12H SCH  famotidine, 20 mg, Oral, Q12H SCH  fluticasone furoate-vilanterol, 1 puff, Inhalation, QAM  gabapentin, 400 mg, Oral, TID  heparin (porcine), 5,000 Units, Subcutaneous, Q8H SCH  insulin lispro, 1-8 Units, Subcutaneous, TID AC  lactobacillus/streptococcus, 1 capsule, Oral, Daily  levoFLOXacin, 500 mg, Oral, Daily  nicotine, 1 patch, Transdermal, Daily  oxybutynin, 5 mg, Oral, TID  predniSONE, 40 mg, Oral, QAM W/BREAKFAST  risperiDONE, 3 mg, Oral, Q12H  sertraline, 100 mg, Oral, Q12H        Continuous Infusions:   acetaminophen, 650 mg, Q4H PRN    Or  acetaminophen, 650 mg, Q4H PRN  acetaminophen, 650 mg,  Once PRN  albuterol-ipratropium, 3 mL, Q4H PRN  ALPRAZolam, 0.5 mg, BID PRN  benzonatate, 200 mg, TID PRN  guaiFENesin, 200 mg, Q4H PRN  naloxone, 0.2 mg, PRN  ondansetron, 4 mg, Once PRN  ondansetron, 4 mg, Q6H PRN    Or  ondansetron, 4 mg, Q6H PRN  oxyCODONE-acetaminophen, 2 tablet, Q4H PRN          [name] A. Gustavus Messing, M.D.  02/04/2019  10:24 AM

## 2019-02-04 NOTE — Discharge Summary (Addendum)
Discharge Summary    Date:02/04/2019   Patient Name: [name]  Attending Physician: Neysa Bonito, MD    Date of Admission:   01/31/2019    Date of Discharge:   02/04/2019    Admitting Diagnosis:   Pneumonia and COPD exacerbation    Discharge Dx:     Principal Diagnosis (Diagnosis after study, that is chiefly responsible for admission to inpatient status): PNA (pneumonia)  Active Hospital Problems    Diagnosis POA    Principal Problem: PNA (pneumonia) Yes      Resolved Hospital Problems   No resolved problems to display.       Treatment Team:   Treatment Team:   Attending Provider: Neysa Bonito, MD  Consulting Physician: Heywood Footman, MD  Consulting Physician: Neysa Bonito, MD     Procedures performed:   Radiology: all results from this admission  Hip Right Ap And Lateral With Pelvis    Result Date: 02/01/2019  Right more severe than left hip osteoarthrosis. Guy Sandifer, MD 02/01/2019 3:08 AM    Ct Angio Chest (pe Study)    Result Date: 02/01/2019  No pulmonary embolism. Improving pneumonitis. Guy Sandifer, MD 02/01/2019 1:18 AM    Nm Pulmonary Ventilation And Perfusion (aerosol Or Gas)    Result Date: 01/08/2019   VERY LOW PROBABILITY FOR PULMONARY EMBOLISM. Leonard Downing, MD 01/08/2019 3:48 PM    Chest Ap Portable    Result Date: 01/31/2019   Perihilar interstitial infiltrates. Bibasal interstitial infiltrates versus atelectasis. Clinical correlation with pneumonia or fluid overload is helpful. Sunday Corn, MD 01/31/2019 9:46 PM    Xr Chest Ap Portable    Result Date: 01/07/2019   Stable cardiomegaly. Stable bilateral interstitial prominence. Trudie Reed, MD 01/07/2019 9:50 AM    Xr Chest Ap Portable    Result Date: 01/06/2019   Stable diffuse lung disease Elyn Peers, MD 01/06/2019 12:10 PM    US Venous Low Extrem Duplx Dopp Comp Bilat    Result Date: 02/02/2019     Normal venous duplex of the lower extremities.  No evidence of intraluminal thrombus or obstruction to venous flow in  right or left lower extremity. Dimitrios  Papadouris, MD 02/02/2019 7:20 AM      Reason for Admission:   Pneumonia and COPD exacerbation  Hospital Course:     Madeline Jabbour Buckleris a 48 y.o.femalewith a PMH of COPD,morbid obesity,tobacco use, CAD , and anxiety admitted for pneumonia and COPD exacerbation.   CTA chest negative for PE. She was recently admitted from 2/12 to 2/22 for acute hypoxic respiratory failure, multilobarpneumoniaandCOPD exacerbation.    #Pneumonia , mycloplasma Pn +  #Acute COPD exacerbation, improved  - improved symptoms of cp and sob. Cp likely costochondritis due to coughing. Continue tessalon perls for relief.   - started prednisone 23m daily  (3/16- ) (was on solumedrol), discharged on prednisone taper  -ID consulted,continuelevaquin and doxycycline x 5 days  -respiratory viral panel : negative  -Blood cx NGTD  -scheduled duonebs, Advair (unable to afford Breo)  -pt has been weaned off of oxygen and doing well on room air     #CAD  -stable, continue aspirin    #OSA   -on cpap qhs     #Anxiety  -continue risperdal and zoloft     #Obesity   #OSA  - encourage CPAP use. Patient to follow-up with primary sleep clinic  - has CPAP at home but does not wear  Condition at Discharge:   Improved, stable.   Today:     BP 125/86    Pulse 68    Temp 98.2 F (36.8 C) (Oral)    Resp 18    Ht 1.753 m (_0 )    Wt 152 kg (335 lb 1.6 oz)    SpO2 95%    BMI 49.49 kg/m   Ranges for the last 24 hours:  Temp:  [97.5 F (36.4 C)-98.2 F (36.8 C)] 98.2 F (36.8 C)  Heart Rate:  [67-83] 68  Resp Rate:  [18] 18  BP: (92-133)/(52-86) 125/86    GEN APPEARANCE: Normal;  A&OX3  HEENT: PERLA; EOMI; Conjunctiva Clear  NECK: Supple; No bruits  CVS: RRR, S1, S2; No M/G/R  LUNGS: CTAB; No Wheezes; No Rhonchi: No rales  ABD: Soft; No TTP; + Normoactive BS  EXT: No edema; Pulses 2+ and intact  Skin exam:  pink  NEURO: non focal, moves all extremities   CAP REFILL:  Normal  MENTAL STATUS:   Normal    Last set of labs   Recent Labs   Lab 02/04/19  0343   WBC 9.82*   Hgb 12.3   Hematocrit 39.1   Platelets 277     Recent Labs   Lab 02/04/19  0343   Sodium 138   Potassium 4.8   Chloride 109   CO2 23   BUN 13.0   Creatinine 0.7   EGFR >60.0   Glucose 128*   Calcium 9.3     Recent Labs   Lab 01/31/19  2242   Bilirubin, Total 0.3   Protein, Total 5.7*   Albumin 3.0*   ALT 10   AST (SGOT) 10     Recent Labs   Lab 01/31/19  2242   PT 12.5*   PT INR 0.9   PTT 26     Recent Labs   Lab 02/01/19  0917   TSH 0.35         Microbiology Results     Procedure Component Value Units Date/Time    Blood Culture Aerobic/Anaerobic #1 [[id number]] Collected:  01/31/19 2242    Specimen:  Arm from Blood, Venipuncture Updated:  02/04/19 0821    Narrative:       ORDER#: [id number]                                    ORDERED BY: Vista Lawman  SOURCE: Blood, Venipuncture arm                      COLLECTED:  01/31/19 22:42  ANTIBIOTICS AT COLL.:                                RECEIVED :  02/01/19 08:14  Culture Blood Aerobic and Anaerobic        PRELIM      02/04/19 08:21  02/02/19   No Growth after 1 day/s of incubation.  02/03/19   No Growth after 2 day/s of incubation.  02/04/19   No Growth after 3 day/s of incubation.      Blood Culture Aerobic/Anaerobic #2 [[id number]] Collected:  01/31/19 2242    Specimen:  Arm from Blood, Venipuncture Updated:  02/04/19 0821    Narrative:       ORDER#: [id number]  ORDERED BY: DONOHOE, ANDREA  SOURCE: Blood, Venipuncture LUE                      COLLECTED:  01/31/19 22:42  ANTIBIOTICS AT COLL.:                                RECEIVED :  02/01/19 08:14  Culture Blood Aerobic and Anaerobic        PRELIM      02/04/19 08:21  02/02/19   No Growth after 1 day/s of incubation.  02/03/19   No Growth after 2 day/s of incubation.  02/04/19   No Growth after 3 day/s of incubation.      CULTURE + Lacretia Leigh [[id number]] Collected:  02/03/19 1826     Specimen:  Sputum, Expectorated Updated:  02/04/19 0035    Narrative:       ORDER#: [id number]                                    ORDERED BY: Gustavus Messing, IMTI  SOURCE: Sputum, Expectorated Sputum                  COLLECTED:  02/03/19 18:26  ANTIBIOTICS AT COLL.:                                RECEIVED :  02/03/19 21:55  Stain, Gram (Respiratory)                  FINAL       02/04/19 00:35  02/04/19   Moderate WBC's             Moderate Mixed Respiratory Flora             Few Squamous epithelial cells  Culture and Gram Stain, Aerobic, RespiratorPENDING      Rapid influenza A/B antigens [[id number]] Collected:  01/31/19 2242    Specimen:  Nasopharyngeal from Nasal Aspirate Updated:  01/31/19 2333    Narrative:       ORDER#: [id number]                                    ORDERED BY: Vista Lawman  SOURCE: Nasal Aspirate                               COLLECTED:  01/31/19 22:42  ANTIBIOTICS AT COLL.:                                RECEIVED :  01/31/19 23:11  Influenza Rapid Antigen A&B                FINAL       01/31/19 23:33  01/31/19   Negative for Influenza A and B             Reference Range: Negative      Respiratory Pathogen Panel, PCR (Film Array) [[id number]] Collected:  02/01/19 1726     Updated:  02/04/19 0448     Adenovirus Not Detected     Coronavirus 229E Not Detected     Coronavirus  HKU1 Not Detected     Coronavirus NL63 Not Detected     Coronavirus OC43 Not Detected     Human Metapneumovirus Not Detected     Human Rhinovirus/Enterovirus Not Detected     Influenza A Not Detected     Influenza A/H1 Not Detected     Influenza AH1 - 2009 Not Detected     Influenza A/H3 Not Detected     Influenza B Not Detected     Parainfluenza Virus 1 Not Detected     Parainfluenza Virus 2 Not Detected     Parainfluenza Virus 3 Not Detected     Parainfluenza Virus 4 Not Detected     Respiratory Syncytial Virus Not Detected     Bordetella pertussis Not Detected     Chlamydophila pneumoniae Not Detected     Mycoplasma pneumoniae  Not Detected     Comment: Multiplex nucleic acid amplification assay for detection of  18 respiratory viruses and bacteria. This assay cannot  differentiate Rhinovirus/Enterovirus. If necessary for  patient care, a positive result for Rhinovirus/Enterovirus  may be followed-up using an alternate method.  Viral and bacterial nucleic acids may persist even though no  viable organism is present. Detection of nucleic acid does  not imply that the corresponding organisms are infectious or  are the causative agents of clinical symptoms. A negative  result does not exclude the possibility of viral or  bacterial infection. Performance characteristics may vary  with circulating strains. The assay may not be able to  distinguish between existing viral strains and new variants  as they emerge.The performance of this test has not been  established in individuals who received influenza vaccine.  Recent administration of a nasal influenza vaccine may cause  false positive results for Influenza A and/or Influenza B.  This assay is FDA cleared for nasopharyngeal swab samples.  Performance characteristics for Bronchoalveolar lavage  samples have been determined by the [facility] laboratory. Other  sample types are unacceptable.          Source NP Swab            Micro / Labs / Path pending:     Unresulted Labs     None          Discharge Instructions For Providers     1. Follow-up with PCP post hospitalization for pneumonia and COPD exacerbation. Follow-up CXR in 6 weeks.   2. Follow-up with PCP for pain management and pain contract          Discharge Instructions:     Follow-up Information     Virgel Bouquet, MD .    Specialty:  Family Medicine  Contact information:  [address]  [place] [address]  [phone]                   Discharge Diet: Cardiac Diet     Disposition:  Home-Health Care Svc     Discharge Medication List      Taking    albuterol 108 (90 Base) MCG/ACT inhaler  Dose:  2 puff  Commonly known as:   PROVENTIL HFA;VENTOLIN HFA  Inhale 2 puffs into the lungs every 4 (four) hours as needed for Wheezing     albuterol-ipratropium 2.5-0.5(3) mg/3 mL nebulizer  Dose:  3 mL  Commonly known as:  DUO-NEB  Take 3 mLs by nebulization every 6 (six) hours as needed (for SOB or Wheezing)     ALPRAZolam 0.5 MG tablet  Dose:  0.5 mg  Commonly known as:  XANAX  Take 0.5 mg by mouth 2 (two) times daily as needed      aspirin 81 MG chewable tablet  Dose:  81 mg  Chew 1 tablet (81 mg total) by mouth daily     atomoxetine 10 MG capsule  Dose:  80 mg  Commonly known as:  STRATTERA  Take 80 mg by mouth daily      benzonatate 200 MG capsule  Dose:  200 mg  Commonly known as:  TESSALON  Take 1 capsule (200 mg total) by mouth 3 (three) times daily as needed for Cough     doxycycline 100 MG capsule  Dose:  100 mg  Commonly known as:  MONODOX  Take 1 capsule (100 mg total) by mouth every 12 (twelve) hours for 5 days     famotidine 20 MG tablet  Dose:  20 mg  Commonly known as:  PEPCID  Take 1 tablet (20 mg total) by mouth every 12 (twelve) hours     fluticasone-salmeterol 500-50 MCG/DOSE Aepb  Dose:  1 puff  Commonly known as:  ADVAIR DISKUS  Inhale 1 puff into the lungs 2 (two) times daily     gabapentin 800 MG tablet  Dose:  800 mg  Commonly known as:  NEURONTIN  Take 800 mg by mouth 3 (three) times daily     guaiFENesin 600 MG 12 hr tablet  Dose:  1,200 mg  Commonly known as:  MUCINEX  Take 2 tablets (1,200 mg total) by mouth 2 (two) times daily     indomethacin 25 MG capsule  Dose:  50 mg  Commonly known as:  INDOCIN  Take 50 mg by mouth 3 (three) times daily with meals     lactobacillus/streptococcus Caps  Dose:  1 capsule  Start taking on:  February 05, 2019  Take 1 capsule by mouth daily     levoFLOXacin 500 MG tablet  Dose:  500 mg  Commonly known as:  LEVAQUIN  Start taking on:  February 05, 2019  Take 1 tablet (500 mg total) by mouth daily for 5 days     naloxone 4 MG/0.1ML nasal spray  Commonly known as:  NARCAN  For:  Opioid  Overdose  1 spray intranasally. If pt does not respond or relapses into respiratory depression call 911. Give additional doses every 2-3 min.     nicotine 14 MG/24HR  Dose:  1 patch  Commonly known as:  Todd 1 patch onto the skin daily     oxybutynin 5 MG tablet  Dose:  5 mg  Commonly known as:  DITROPAN  Take 5 mg by mouth 3 (three) times daily     oxyCODONE-acetaminophen 10-325 MG per tablet  Dose:  1 tablet  Commonly known as:  PERCOCET  Take 1 tablet by mouth every 4 (four) hours as needed for Pain     predniSONE 10 MG tablet  Commonly known as:  DELTASONE  Start taking on:  February 04, 2019  Take 4 tablets (40 mg total) by mouth every morning with breakfast for 2 days, THEN 3 tablets (30 mg total) every morning with breakfast for 2 days, THEN 2 tablets (20 mg total) every morning with breakfast for 2 days, THEN 1 tablet (10 mg total) every morning with breakfast for 2 days.     RisperDAL 3 MG tablet  Dose:  3 mg  Generic drug:  risperiDONE  Take 3 mg by mouth  2 (two) times daily     Zoloft 100 MG tablet  Dose:  100 mg  Generic drug:  sertraline  Take 100 mg by mouth 2 (two) times daily         STOP taking these medications    fluticasone furoate-vilanterol 200-25 MCG/INH Aepb  Commonly known as:  BREO ELLIPTA          Minutes spent coordinating discharge and reviewing discharge plan: 35 minutes, reviewing medications, discharge plan and instructions.       Signed by: Neysa Bonito, MD

## 2019-02-04 NOTE — Progress Notes (Addendum)
Discharge order written.    CM spoke with RN and patient.  Transport home is needed.  CM will request [facility] transport and confirm pickup time.    Call to [facility] to schedule wheelchair [facility] transport.  The dispatcher expressed concern about patient's ability to transfer to and from the [facility] given her weight.  CM explained that patient said she was able to transfer.  If upon getting into the [facility] patient has difficulty, then the trip would have to be changed to an ambulance given [facility]' weight limit policy.  Pickup scheduled for 12:00PM.  CM will confirm with patient and RN.    11:50AM: RN notified CM that patient had ordered lunch and had not yet received it.  RN requested that transport be changed to 1:00PM.  Call to [facility] to reschedule patient's transport.  Patient will now be picked-up at 1:00PM.  RN informed.      Memory Argue, MSW  Social Worker Case Manager [facility]  [phone]      .

## 2019-02-04 NOTE — Plan of Care (Signed)
Discharge Note:     Discharge location: home  Home services/Report given to:NA    Discharge instructions, follow up, and medications reviewed and explained to patient with at bedside - questions answered stated understanding.     Prescriptions: paper prescription provided       IV and tele removed. All belongings with patient. VSS.   Pt  will be transported off of unit via wheel chair at 1pm

## 2019-02-04 NOTE — Plan of Care (Signed)
Problem: Safety  Goal: Patient will be free from injury during hospitalization  Outcome: Progressing  Flowsheets (Taken 02/04/2019 0836)  Patient will be free from injury during hospitalization : Assess patient's risk for falls and implement fall prevention plan of care per policy; Ensure appropriate safety devices are available at the bedside; Assess for patients risk for elopement and implement [place] per policy; Provide alternative method of communication if needed (communication boards, writing); Provide and maintain safe environment; Include patient/ family/ care giver in decisions related to safety; Use appropriate transfer methods; Hourly rounding  Goal: Patient will be free from infection during hospitalization  Outcome: Progressing  Flowsheets (Taken 02/04/2019 0836)  Free from Infection during hospitalization: Assess and monitor for signs and symptoms of infection; Monitor all insertion sites (i.e. indwelling lines, tubes, urinary catheters, and drains); Monitor lab/diagnostic results; Encourage patient and family to use good hand hygiene technique     Problem: Pain  Goal: Pain at adequate level as identified by patient  Outcome: Progressing  Flowsheets (Taken 02/04/2019 0836)  Pain at adequate level as identified by patient: Identify patient comfort function goal; Assess pain on admission, during daily assessment and/or before any "as needed" intervention(s); Reassess pain within 30-60 minutes of any procedure/intervention, per Pain Assessment, Intervention, Reassessment (AIR) Cycle; Assess for risk of opioid induced respiratory depression, including snoring/sleep apnea. Alert healthcare team of risk factors identified.; Evaluate if patient comfort function goal is met; Offer non-pharmacological pain management interventions; Consult/collaborate with Pain Service; Evaluate patient's satisfaction with pain management progress; Include patient/patient care companion in decisions related to pain  management as needed; Consult/collaborate with Physical Therapy, Occupational Therapy, and/or Speech Therapy     Problem: Side Effects from Pain Analgesia  Goal: Patient will experience minimal side effects of analgesic therapy  Outcome: Progressing  Flowsheets (Taken 02/04/2019 0836)  Patient will experience minimal side effects of analgesic therapy: Monitor/assess patient's respiratory status (RR depth, effort, breath sounds); Prevent/manage side effects per LIP orders (i.e. nausea, vomiting, pruritus, constipation, urinary retention, etc.); Evaluate for opioid-induced sedation with appropriate assessment tool (i.e. POSS); Assess for changes in cognitive function     Problem: Discharge Barriers  Goal: Patient will be discharged home or other facility with appropriate resources  Outcome: Progressing  Flowsheets (Taken 02/04/2019 0836)  Discharge to home or other facility with appropriate resources: Provide appropriate patient education; Provide information on available health resources; Initiate discharge planning     Problem: Psychosocial and Spiritual Needs  Goal: Demonstrates ability to cope with hospitalization/illness  Outcome: Progressing  Flowsheets (Taken 02/04/2019 0836)  Demonstrates ability to cope with hospitalizations/illness: Encourage verbalization of feelings/concerns/expectations; Encourage patient to set small goals for self; Include patient/ patient care companion in decisions; Provide quiet environment; Encourage participation in diversional activity; Communicate referral to spiritual care as appropriate; Assist patient to identify own strengths and abilities; Reinforce positive adaptation of new coping behaviors     Problem: Moderate/High Fall Risk Score >5  Goal: Patient will remain free of falls  Outcome: Progressing  Flowsheets (Taken 02/02/2019 2000 by Janeann Merl, RN)  High (Greater than 13): HIGH-Bed alarm on at all times while patient in bed;HIGH-Apply yellow "Fall Risk" arm  band;HIGH-Initiate use of floor mats as appropriate;HIGH-Consider use of low bed     Problem: Compromised Hemodynamic Status  Goal: Vital signs and fluid balance maintained/improved  Outcome: Progressing  Flowsheets (Taken 02/04/2019 0836)  Vital signs and fluid balance are maintained/improved: Position patient for maximum circulation/cardiac output; Monitor intake and output. Notify LIP  if urine output is less than 30 mL/hour.; Monitor/assess vitals and hemodynamic parameters with position changes; Monitor/assess lab values and report abnormal values; Monitor and compare daily weight     Problem: Infection  Goal: Free from infection  Outcome: Progressing  Flowsheets (Taken 02/04/2019 0836)  Free from infection : Assess for signs/symptoms of infection; Consult/collaborate with Infection Preventionist; Utilize isolation precautions per protocol/policy; Utilize sepsis protocol; Assess immunization status     Problem: Inadequate Tissue Perfusion  Goal: Adequate tissue perfusion will be maintained  Outcome: Progressing  Flowsheets (Taken 02/04/2019 0836)  Adequate tissue perfusion will be maintained: Monitor/assess vital signs; Monitor/assess neurovascular status (pulses, capillary refill, pain, paresthesia, paralysis, presence of edema); Monitor/assess lab values and report abnormal values; Monitor intake and output; Monitor/assess for signs of VTE (edema of calf/thigh redness, pain); Monitor for signs and symptoms of a pulmonary embolism (dyspnea, tachypnea, tachycardia, confusion); VTE Prevention: Administer anticoagulant(s) and/or apply anti-embolism stockings/devices as ordered; Encourage/assist patient as needed to turn, cough, and perform deep breathing every 2 hours; Reinforce use of ordered respiratory interventions (i.e. CPAP, BiPAP, Incentive Spirometer, Acapella, etc.); Reinforce ankle pump exercises; Increase mobility as tolerated/progressive mobility; Assess and monitor skin integrity; Perform active/passive  ROM; Position patient for maximum circulation/cardiac output; Elevate feet; Place shoes or other foot protection on patient; Provide wound/skin care     Problem: Ineffective Airway Clearance  Goal: Airway is maintained  Outcome: Progressing  Flowsheets (Taken 02/04/2019 0836)  Airway is maintained : Teach/Reinforce use of incentive spirometer 10 times per hour while awake, cough and deep breath as needed; Maintain oxygen level of greater than 92% or as ordered by LIP (reworded); Reposition patient every 2 hours and as needed unless able to self-reposition     Problem: Inadequate Airway Clearance  Goal: Normal respiratory rate/effort achieved/maintained  Outcome: Progressing  Flowsheets (Taken 02/04/2019 0836)  Normal respiratory rate/effort achieved/maintained: Plan activities to conserve energy: plan rest periods

## 2019-02-04 NOTE — Plan of Care (Signed)
Problem: Safety  Goal: Patient will be free from injury during hospitalization  Outcome: Progressing  Patient will be free from injury during hospitalization : Assess patient's risk for falls and implement fall prevention plan of care per policy;Provide and maintain safe environment;Use appropriate transfer methods;Ensure appropriate safety devices are available at the bedside;Include patient/ family/ care giver in decisions related to safety     Problem: Pain  Goal: Pain at adequate level as identified by patient  Outcome: Progressing  Pain at adequate level as identified by patient: Identify patient comfort function goal;Assess pain on admission, during daily assessment and/or before any "as needed" intervention(s);Reassess pain within 30-60 minutes of any procedure/intervention, per Pain Assessment, Intervention, Reassessment (AIR) Cycle;Evaluate if patient comfort function goal is met;Evaluate patient's satisfaction with pain management progress;Offer non-pharmacological pain management interventions    Problem: Infection  Goal: Free from infection  Outcome: Progressing       Free from infection : Assess for signs/symptoms of infection;Utilize isolation      precautions per protocol/policy;Utilize sepsis protocol     Problem: Compromised Hemodynamic Status  Goal: Vital signs and fluid balance maintained/improved  Outcomes: Progressing  Vital signs and fluid balance are maintained/improved: Position patient for maximum circulation/cardiac output;Monitor/assess vitals and hemodynamic parameters with position changes;Monitor and compare daily weight;Monitor/assess lab values and report abnormal values;Monitor intake and output. Notify LIP if urine output is less than 30 mL/hour.

## 2019-02-05 NOTE — UM Notes (Signed)
[facility] Utilization Review  NPI: [id number], Tax ID: [id number]  Please Call Janalyn Rouse @ [phone] with any questions or concerns. Confidential voicemail.  Email: [email or web address]   Fax final authorizations and requests for additional information to [phone]    02/03/19 Concurrent Clinical inpatient review    02/01/19 Placed in Observation Status and changed to Inpatient 02/02/19     02/03/19 Medicine Assessment/plan:  #Pneumonia   #Acute COPD exacerbation  - continues to have sx of cp and sob upon ambulation. Cp likely costochondritis due to coughing. Will order tessalon perls for relief.   - start prednisone 48m daily (was on solumedrol)  -ID consulted,continuelevaquin and doxycycline  -respiratory viral panel pending   -Blood cx NGTD  -scheduled duonebs, breo  -pt has been weaned off of oxygen and doing well on room air     #CAD  -stable, continue aspirin    #OSA   -on cpap qhs     #Anxiety  -continue risperdal and zoloft     #Obesity   - suspect sleep apnea. Recommend op sleep study.     Temperature: Temp  Min: 97.9 F (36.6 C)  Max: 98.4 F (36.9 C)  Pulse: Pulse  Min: 62  Max: 90  Respiratory: Resp  Min: 16  Max: 20  Non-Invasive BP: BP  Min: 100/61  Max: 119/76  Pulse Oximetry SpO2  Min: 94 %  Max: 98 %      02/03/2019 03:08  WBC: 11.52       Current Facility-Administered Medications   Medication Dose Route Frequency    albuterol-ipratropium  3 mL Nebulization Q6H SCH    aspirin  81 mg Oral Daily    doxycycline  100 mg Oral Q12H [place]   famotidine  20 mg Oral Q12H SCH    fluticasone furoate-vilanterol  1 puff Inhalation QAM    gabapentin  400 mg Oral TID    heparin (porcine)  5,000 Units Subcutaneous Q8H [place]   insulin lispro  1-8 Units Subcutaneous TID AC    lactobacillus/streptococcus  1 capsule Oral Daily    levoFLOXacin  500 mg Oral Daily    nicotine  1 patch Transdermal Daily    oxybutynin  5 mg Oral TID    risperiDONE  3 mg Oral Q12H    sertraline  100  mg Oral Q12H     oxyCODONE-acetaminophen (PERCOCET) 5-325 MG per tablet 2 tablet   Dose: 2 tablet  Freq: Every 4 hours PRN Route: Pox 5 doses

## 2019-02-05 NOTE — UM Notes (Addendum)
Requesting final auth to be faxed to [phone] and additional information can be requested by calling   [phone]    Auth Number :[id number]  Discharge Date -  02/04/2019  1:19 PM    ER ADMIT DATE AND TIME: 01/31/2019  9:22 PM  OBS ADMIT DATE: 02/01/19  IP ADMIT DATE: 02/02/19    NAME: [name]             MR#: [id number]      Patient Address:  [address]  [address] [place] [address]    Patient phone: [phone] (home)     PATIENT NAME: [name], [name]  DOB: 1971-10-14   PMH:  has a past medical history of Anxiety, Chronic obstructive pulmonary disease, Myocardial infarction, and Panic attacks.  PSH:  has a past surgical history that includes Cholecystectomy; Appendectomy; and ARTHROSCOPIC KNEE, ACL RECONSTRUCTION.      DIAGNOSIS:     ICD-10-CM    1. Pneumonia due to infectious organism, unspecified laterality, unspecified part of lung J18.9    2. Chest pain, unspecified type R07.9    3. COPD exacerbation J44.1    4. PNA (pneumonia) J18.9    5. COPD with acute exacerbation J44.1 Activity as tolerated     Nebulizer Kit

## 2019-02-06 NOTE — UM Notes (Signed)
[facility] Utilization Review  NPI: [id number], Tax ID: [id number]  Please Call Janalyn Rouse @ [phone] with any questions or concerns. Confidential voicemail.  Email: [email or web address]   Fax final authorizations and requests for additional information to [phone]    02/02/19 Concurrent Clinical review    02/01/19 Placed in observation status and changed to inpatient 02/01/19    Temperature: Temp  Min: 97.9 F (36.6 C)  Max: 98.6 F (37 C)  Pulse: Pulse  Min: 57  Max: 88  Respiratory: Resp  Min: 16  Max: 20  Non-Invasive BP: BP  Min: 108/67  Max: 136/70  Pulse Oximetry SpO2  Min: 93 %  Max: 97 %    02/02/19 Medicine Assessment/Plan  #Pneumonia   #Acute COPD exacerbation  -continue iv solumedrol and tapered to 61m q8h   -ID consulted, continue levaquin and doxycycline  -respiratory viral panel pending   -check sputum cx. Blood cx NGTD   -scheduled duonebs, breo  -supplemental oxygen, on 2L NC.      #CAD  -stable, continue aspirin    #OSA   -on cpap qhs     #Anxiety  -continue risperdal and zoloft             Current Facility-Administered Medications   Medication Dose Route Frequency    albuterol-ipratropium  3 mL Nebulization Q6H SCH    aspirin  81 mg Oral Daily    doxycycline  100 mg Oral Q12H SKeith   famotidine  20 mg Oral Q12H SCH    fluticasone furoate-vilanterol  1 puff Inhalation QAM    gabapentin  400 mg Oral TID    heparin (porcine)  5,000 Units Subcutaneous [facility]H [facility]   lactobacillus/streptococcus  1 capsule Oral Daily    levoFLOXacin  500 mg Oral Daily    methylPREDNISolone  40 mg Intravenous Q8H    nicotine  1 patch Transdermal Daily    oxybutynin  5 mg Oral TID    risperiDONE  3 mg Oral Q12H    sertraline  100 mg Oral Q12H        methylPREDNISolone sodium succinate (Solu-MEDROL) injection 40 mg   Dose: 40 mg  Freq: Every 8 hours Route: IVx 2 doses    methylPREDNISolone sodium succinate (Solu-MEDROL) injection 60 mg   Dose: 60 mg  Freq: Every 8 hours Route: IV x  1 dose    oxyCODONE-acetaminophen (PERCOCET) 5-325 MG per tablet 2 tablet   Dose: 2 tablet  Freq: Every 4 hours PRN Route: PO x 3 doses

## 2019-02-09 NOTE — UM Notes (Signed)
Requesting final auth to be faxed to [phone] and additional information can be requested by calling   [phone]    Auth Number :[id number]  Discharge Date -  02/04/2019  1:19 PM    ER ADMIT DATE AND TIME: 01/31/2019  9:22 PM  OBS ADMIT DATE: 02/01/19  IP ADMIT DATE: 02/02/19    NAME: [name]             MR#: [id number]      Patient Address:  [address]  [address]  [place] [address]    Patient phone: [phone] (home)     PATIENT NAME: [name],Madeline L  DOB: 12/27/1970   PMH:  has a past medical history of Anxiety, Chronic obstructive pulmonary disease, Myocardial infarction, and Panic attacks.  PSH:  has a past surgical history that includes Cholecystectomy; Appendectomy; and ARTHROSCOPIC KNEE, ACL RECONSTRUCTION.      DIAGNOSIS:     ICD-10-CM    1. Pneumonia due to infectious organism, unspecified laterality, unspecified part of lung J18.9    2. Chest pain, unspecified type R07.9    3. COPD exacerbation J44.1    4. PNA (pneumonia) J18.9    5. COPD with acute exacerbation J44.1 Activity as tolerated     Nebulizer Kit

## 2019-06-04 ENCOUNTER — Emergency Department
Admission: EM | Admit: 2019-06-04 | Discharge: 2019-06-05 | Disposition: A | Discharge status: Home / Self Care | Payer: Medicaid Other | Attending: Emergency Medicine | Admitting: Emergency Medicine

## 2019-06-04 ENCOUNTER — Emergency Department: Payer: Medicaid Other

## 2019-06-04 DIAGNOSIS — Z87891 Personal history of nicotine dependence: Secondary | ICD-10-CM | POA: Insufficient documentation

## 2019-06-04 DIAGNOSIS — M25551 Pain in right hip: Secondary | ICD-10-CM | POA: Insufficient documentation

## 2019-06-04 DIAGNOSIS — Z20822 Contact with and (suspected) exposure to covid-19: Secondary | ICD-10-CM

## 2019-06-04 DIAGNOSIS — J449 Chronic obstructive pulmonary disease, unspecified: Secondary | ICD-10-CM | POA: Insufficient documentation

## 2019-06-04 DIAGNOSIS — U071 COVID-19: Secondary | ICD-10-CM | POA: Insufficient documentation

## 2019-06-04 DIAGNOSIS — N390 Urinary tract infection, site not specified: Secondary | ICD-10-CM

## 2019-06-04 DIAGNOSIS — R0602 Shortness of breath: Secondary | ICD-10-CM

## 2019-06-04 DIAGNOSIS — N939 Abnormal uterine and vaginal bleeding, unspecified: Secondary | ICD-10-CM | POA: Insufficient documentation

## 2019-06-04 LAB — CBC AND DIFFERENTIAL
Absolute NRBC: 0 10*3/uL (ref 0.00–0.00)
Basophils Absolute Automated: 0.08 10*3/uL (ref 0.00–0.08)
Basophils Automated: 0.8 %
Eosinophils Absolute Automated: 1.02 10*3/uL — ABNORMAL HIGH (ref 0.00–0.44)
Eosinophils Automated: 10.2 %
Hematocrit: 38.2 % (ref 34.7–43.7)
Hgb: 12.6 g/dL (ref 11.4–14.8)
Immature Granulocytes Absolute: 0.02 10*3/uL (ref 0.00–0.07)
Immature Granulocytes: 0.2 %
Lymphocytes Absolute Automated: 3.42 10*3/uL — ABNORMAL HIGH (ref 0.42–3.22)
Lymphocytes Automated: 34.2 %
MCH: 29.5 pg (ref 25.1–33.5)
MCHC: 33 g/dL (ref 31.5–35.8)
MCV: 89.5 fL (ref 78.0–96.0)
MPV: 9.8 fL (ref 8.9–12.5)
Monocytes Absolute Automated: 0.62 10*3/uL (ref 0.21–0.85)
Monocytes: 6.2 %
Neutrophils Absolute: 4.85 10*3/uL (ref 1.10–6.33)
Neutrophils: 48.4 %
Nucleated RBC: 0 /100 WBC (ref 0.0–0.0)
Platelets: 257 10*3/uL (ref 142–346)
RBC: 4.27 10*6/uL (ref 3.90–5.10)
RDW: 13 % (ref 11–15)
WBC: 10.01 10*3/uL — ABNORMAL HIGH (ref 3.10–9.50)

## 2019-06-04 MED ORDER — METHYLPREDNISOLONE SODIUM SUCC 125 MG IJ SOLR
60.00 mg | Freq: Once | INTRAMUSCULAR | Status: AC
Start: 2019-06-04 — End: 2019-06-05
  Administered 2019-06-05: 01:00:00 60 mg via INTRAVENOUS
  Filled 2019-06-04: qty 2

## 2019-06-04 MED ORDER — SODIUM CHLORIDE 0.9 % IV BOLUS
1000.00 mL | Freq: Once | INTRAVENOUS | Status: AC
Start: 2019-06-04 — End: 2019-06-05
  Administered 2019-06-05: 01:00:00 1000 mL via INTRAVENOUS

## 2019-06-04 MED ORDER — MAGNESIUM SULFATE IN D5W 1-5 GM/100ML-% IV SOLN
1.00 g | Freq: Once | INTRAVENOUS | Status: AC
Start: 2019-06-04 — End: 2019-06-05
  Administered 2019-06-05: 01:00:00 1 g via INTRAVENOUS
  Filled 2019-06-04: qty 100

## 2019-06-04 MED ORDER — ALBUTEROL SULFATE HFA 108 (90 BASE) MCG/ACT IN AERS
2.00 | INHALATION_SPRAY | Freq: Once | RESPIRATORY_TRACT | Status: AC
Start: 2019-06-04 — End: 2019-06-04
  Administered 2019-06-04: 23:00:00 2 via RESPIRATORY_TRACT

## 2019-06-04 MED ORDER — KETOROLAC TROMETHAMINE 30 MG/ML IJ SOLN
15.00 mg | Freq: Once | INTRAMUSCULAR | Status: DC
Start: 2019-06-04 — End: 2019-06-05
  Filled 2019-06-04: qty 1

## 2019-06-04 NOTE — ED Provider Notes (Signed)
EMERGENCY DEPARTMENT HISTORY AND PHYSICAL EXAM     None        Date: 06/04/2019  Patient Name: [name]  Attending Physician: Darcella Cheshire, MD  Advanced Practice Provider: Harlene Salts    History of Presenting Illness       History Provided By: Patient    Chief Complaint:  Chief Complaint   Patient presents with    Vaginal Bleeding    Hip Pain     Onset: Earlier today  Timing: Constant  Location: Intermittent  Quality: clots  Severity:Moderate  Exacerbating factors: None  Alleviating factors: None  Associated Symptoms: see ROS  Pertinent Negatives: see ROS    Additional History: [name] is a 48 y.o. female with a hx of COPD and obstructive sleep apnea presenting to the ED with vaginal bleeding x earlier today with associated lower abd cramping and lower back pain. She is passing clots. Pt has not had a period in over 1 year. Pt also notes having R hip pain for several days. She also reports having acute on chronic SOB. Pt has been using nebs at home w/out improvement. She was recently admitted for COPD exacerbation and PNA and feels like she has PNA again. Denies chest pain, known COVID exposures, or syncope.    PCP: Virgel Bouquet, MD  SPECIALISTS:     [name]   Home Medication Instructions SOX:[id number]    Printed on:06/05/19 0457   Medication Information 08 AM 10 AM 12 Noon 06 PM 08 PM 10 PM Bed time             albuterol (PROVENTIL HFA;VENTOLIN HFA) 108 (90 Base) MCG/ACT inhaler  Inhale 2 puffs into the lungs every 4 (four) hours as needed for Wheezing            albuterol-ipratropium (DUO-NEB) 2.5-0.5(3) mg/3 mL nebulizer  Take 3 mLs by nebulization every 6 (six) hours as needed (for SOB or Wheezing)            ALPRAZolam (XANAX) 0.5 MG tablet  Take 0.5 mg by mouth 2 (two) times daily as needed               atomoxetine (STRATTERA) 10 MG capsule  Take 80 mg by mouth daily               benzonatate (TESSALON) 200 MG capsule  Take 1 capsule (200 mg total) by mouth 3  (three) times daily as needed for Cough            famotidine (PEPCID) 20 MG tablet  Take 1 tablet (20 mg total) by mouth every 12 (twelve) hours            fluticasone-salmeterol (ADVAIR DISKUS) 500-50 MCG/DOSE Aerosol Powder, Breath Activtivatede  Inhale 1 puff into the lungs 2 (two) times daily            gabapentin (NEURONTIN) 800 MG tablet  Take 800 mg by mouth 3 (three) times daily            guaiFENesin (MUCINEX) 600 MG 12 hr tablet  Take 2 tablets (1,200 mg total) by mouth 2 (two) times daily            indomethacin (INDOCIN) 25 MG capsule  Take 50 mg by mouth 3 (three) times daily with meals            lactobacillus/streptococcus (RISAQUAD) Cap  Take 1 capsule by mouth daily  naloxone (NARCAN) 4 MG/0.1ML nasal spray  1 spray intranasally. If pt does not respond or relapses into respiratory depression call 911. Give additional doses every 2-3 min.            nitrofurantoin, macrocrystal-monohydrate, (Macrobid) 100 MG capsule  Take 1 capsule (100 mg total) by mouth 2 (two) times daily for 10 days            ondansetron (ZOFRAN-ODT) 4 MG disintegrating tablet  Take 1 tablet (4 mg total) by mouth every 6 (six) hours as needed for Nausea            oxybutynin (DITROPAN) 5 MG tablet  Take 5 mg by mouth 3 (three) times daily            oxyCODONE-acetaminophen (PERCOCET) 10-325 MG per tablet  Take 1 tablet by mouth every 4 (four) hours as needed for Pain            predniSONE (DELTASONE) 20 MG tablet  Take 2 tablets (40 mg total) by mouth daily for 5 days            risperiDONE (RISPERDAL) 3 MG tablet  Take 3 mg by mouth 2 (two) times daily            sertraline (ZOLOFT) 100 MG tablet  Take 100 mg by mouth 2 (two) times daily                   Past History     Past Medical History:  Past Medical History:   Diagnosis Date    Anxiety     Chronic obstructive pulmonary disease     Myocardial infarction     Panic attacks        Past Surgical History:  Past Surgical History:   Procedure Laterality Date     APPENDECTOMY      ARTHROSCOPIC KNEE, ACL RECONSTRUCTION      R. knee     CHOLECYSTECTOMY         Family History:  Family History   Problem Relation Age of Onset    Heart attack Father     Diabetes Father     Asthma Sister     Diabetes Maternal Grandmother     Diabetes Paternal Grandmother     Heart disease Paternal Grandmother        Social History:  Social History     Socioeconomic History    Marital status: Single     Spouse name: None    Number of children: None    Years of education: None    Highest education level: None   Occupational History    None   Social Transport planner strain: None    Food insecurity     Worry: None     Inability: None    Transportation needs     Medical: None     Non-medical: None   Tobacco Use    Smoking status: Former Smoker     Packs/day: 1.00     Years: 25.00     Pack years: 25.00     Types: Cigarettes     Last attempt to quit: 12/29/2018     Years since quitting: 0.4    Smokeless tobacco: Never Used   Substance and Sexual Activity    Alcohol use: Never     Frequency: Never    Drug use: Never    Sexual activity: None   Lifestyle    Physical activity  Days per week: None     Minutes per session: None    Stress: None   Relationships    Social connections     Talks on phone: None     Gets together: None     Attends religious service: None     Active member of club or organization: None     Attends meetings of clubs or organizations: None     Relationship status: None    Intimate partner violence     Fear of current or ex partner: None     Emotionally abused: None     Physically abused: None     Forced sexual activity: None   Other Topics Concern    None   Social History Narrative    None       Allergies:  Allergies   Allergen Reactions    Iodine Solution [Povidone Iodine]      Contrast dye        Review of Systems     General: No fever, no sweats, no chills.   HENT: No headache, no nasal congestion, no sore throat  Respiratory: No cough, +  shortness of breath.  Cardiovascular: No chest pain, no calf pain or swelling.   Gastrointestinal: + abdominal cramping, no nausea, no vomiting, no diarrhea.   Genito-Urinary: No dysuria, no frequency, no hematuria. + vaginal bleeding  Musculoskeletal: No myalgias, + hip pain. + back pain   Neurological: No new focal weakness, no sensory changes.   Dermatological: No new rashes, no color changes.   Psychological: No acute mood changes, no confusion.     Review of Systems    Physical Exam     Vitals:    06/04/19 2057 06/05/19 0413   BP: 106/67 118/72   Pulse: 72 81   Resp: (!) 24 18   Temp: 98.2 F (36.8 C) 98.3 F (36.8 C)   TempSrc:  Oral   SpO2: 96% 98%   Weight: 149.2 kg          Constitutional: Vital signs reviewed.  Obese chronically ill-appearing   head: Normocephalic, atraumatic. No external trauma noted.  Eyes: Conjunctiva and sclera are normal. Extraocular movements intact, pupils equal, round, reactive.  Ear, Nose, Throat:  Normal external examination of the nose and ears.   Neck: Supple.    Respiratory/Chest: Decreased bilaterally  Cardiovascular: Regular rate. Regular rhythm. S1, S2.   Abdomen: Soft distended due to body habitus  Back: No midline tenderness, no CVA tenderness.   Extremities: Upper and lower extremities with no cyanosis or edema. No calf tenderness. Normal +2 pulses in all extremities.  Skin: Warm and dry. No rash.  Neuro: alert and appropriate, normal speech, no facial droop, moving all extremities.       Physical Exam   Musculoskeletal:        Legs:          Diagnostic Study Results     Labs -     Results     Procedure Component Value Units Date/Time    Urinalysis Reflex to Microscopic Exam- Reflex to Culture [[id number]]  (Abnormal) Collected:  06/05/19 0033     Updated:  06/05/19 0159     Urine Type Urine, Clean Ca     Color, UA Red     Clarity, UA Hazy     Specific Gravity UA 1.009     Urine pH 6.0     Leukocyte Esterase, UA Small     Nitrite, UA Negative  Protein, UR 100      Glucose, UA Negative     Ketones UA Negative     Urobilinogen, UA Negative mg/dL      Bilirubin, UA Negative     Blood, UA Large     RBC, UA TNTC /hpf      WBC, UA 11 - 25 /hpf      Squamous Epithelial Cells, Urine 0 - 5 /hpf     Narrative:       Replace urinary catheter prior to obtaining the urine culture  if it has been in place for greater than or equal to 14  days:->N/A No Foley  Indications for U/A Reflex to Micro - Reflex to  Culture:->Other (please specify in Comments)    Potassium [[id number]] Collected:  06/05/19 0048    Specimen:  Blood Updated:  06/05/19 0117     Potassium 4.0 mEq/L     Hemolysis index [[id number]] Collected:  06/05/19 0048     Updated:  06/05/19 0117     Hemolysis Index 8    Urine BHCG POC [[id number]] Collected:  06/05/19 0036     Updated:  06/05/19 0043     Urine bHCG POC Negative    UA, Reflex to Microscopic (pts  3 + yrs) [[id number]] Collected:  06/05/19 0033    Specimen:  Urine Updated:  06/05/19 0034    Urine culture [[id number]] Collected:  06/05/19 0033    Specimen:  Urine, Clean Catch Updated:  06/05/19 0034    Narrative:       Replace urinary catheter prior to obtaining the urine culture  if it has been in place for greater than or equal to 14  days:->N/A No Foley  Indications for Urine Culture:->Suprapubic Pain/Tenderness or  Dysuria    Comprehensive metabolic panel [[id number]]  (Abnormal) Collected:  06/04/19 2342    Specimen:  Blood Updated:  06/05/19 0027     Glucose 91 mg/dL      BUN 10.0 mg/dL      Creatinine 0.8 mg/dL      Sodium 135 mEq/L      Potassium 7.0 mEq/L      Chloride 110 mEq/L      CO2 18 mEq/L      Calcium 8.9 mg/dL      Protein, Total 6.5 g/dL      Albumin 3.3 g/dL      AST (SGOT) 22 U/L      ALT 13 U/L      Alkaline Phosphatase 85 U/L      Bilirubin, Total 0.4 mg/dL      Globulin 3.2 g/dL      Albumin/Globulin Ratio 1.0     Anion Gap 7.0    Narrative:       Rescheduled by [id number] at 06/04/2019 23:38 Reason: Printed by   mistake/Printing Issues.    Lipase  [[id number]] Collected:  06/04/19 2342    Specimen:  Blood Updated:  06/05/19 0016     Lipase 9 U/L     Narrative:       Rescheduled by [id number] at 06/04/2019 23:38 Reason: Printed by   mistake/Printing Issues.    Hemolysis index [[id number]]  (Abnormal) Collected:  06/04/19 2342     Updated:  06/05/19 0016     Hemolysis Index 250    Narrative:       Rescheduled by [id number] at 06/04/2019 23:38 Reason: Printed by   mistake/Printing Issues.    GFR [[id number]] Collected:  06/04/19 2342     Updated:  06/05/19 0016     EGFR >60.0    Narrative:       Rescheduled by [id number] at 06/04/2019 23:38 Reason: Printed by   mistake/Printing Issues.    Troponin I [[id number]] Collected:  06/04/19 2342     Updated:  06/05/19 0016     Troponin I <0.01 ng/mL     Narrative:       Rescheduled by [id number] at 06/04/2019 23:38 Reason: Printed by   mistake/Printing Issues.    CBC and differential [[id number]]  (Abnormal) Collected:  06/04/19 2342    Specimen:  Blood Updated:  06/04/19 2350     WBC 10.01 x10 3/uL      Hgb 12.6 g/dL      Hematocrit 38.2 %      Platelets 257 x10 3/uL      RBC 4.27 x10 6/uL      MCV 89.5 fL      MCH 29.5 pg      MCHC 33.0 g/dL      RDW 13 %      MPV 9.8 fL      Neutrophils 48.4 %      Lymphocytes Automated 34.2 %      Monocytes 6.2 %      Eosinophils Automated 10.2 %      Basophils Automated 0.8 %      Immature Granulocytes 0.2 %      Nucleated RBC 0.0 /100 WBC      Neutrophils Absolute 4.85 x10 3/uL      Lymphocytes Absolute Automated 3.42 x10 3/uL      Monocytes Absolute Automated 0.62 x10 3/uL      Eosinophils Absolute Automated 1.02 x10 3/uL      Basophils Absolute Automated 0.08 x10 3/uL      Immature Granulocytes Absolute 0.02 x10 3/uL      Absolute NRBC 0.00 x10 3/uL     Narrative:       Rescheduled by [id number] at 06/04/2019 23:38 Reason: Printed by   mistake/Printing Issues.    Troponin I [[id number]] Collected:  06/04/19 2342    Specimen:  Blood Updated:  06/04/19 2342    Narrative:       Rescheduled by [id number] at 06/04/2019  23:38 Reason: Printed by   mistake/Printing Issues.  Rescheduled by [id number] at 06/04/2019 23:38 Reason: Printed by   mistake/Printing Issues.          Radiologic Studies -   Radiology Results (24 Hour)     Procedure Component Value Units Date/Time    US Transvaginal Non OB with LTD Doppler (Ovarian Torsion) [[id number]] Collected:  06/05/19 0325    Order Status:  Completed Updated:  06/05/19 0332    Narrative:       US TRANSVAGINAL NON OB WITH LTD DOPPLER    HISTORY:  Pain    TECHNIQUE:  Transvaginal ultrasound pelvis.    COMPARISON:  None.    FINDINGS:    Uterus: 6.7 x 5.5 x 7.2 cm.  Myometrium: Unremarkable.  Endometrium: Unremarkable. Endometrial thickness 8 mm.  Cervix: Negative.    Right ovary:  Not visualized  Left ovary: 2.8 x 1.8 x 1.7 cm. Normal blood flow. No concerning masses.    Free pelvic fluid: None.  Other findings: None significant      Impression:           1. Nonvisualized right ovary, otherwise negative pelvic ultrasound.    Delaney Meigs, MD   06/05/2019 3:30 AM    XR Hip right  1 vw with pelvis [[id number]] Collected:  06/05/19 0045    Order Status:  Completed Updated:  06/05/19 0051    Narrative:       HISTORY: Pain.    COMPARISON: 02/01/2019    TECHNIQUE: XR HIP RIGHT 1 VW WITH PELVIS    FINDINGS:  There are severe degenerative changes in the right hip with joint space  narrowing and osteophyte formation. Mild degenerative changes are  present in the left hip. These findings are stable.  There is no  evidence of fracture or dislocation.  The soft tissues are normal.      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 No evidence of fracture.    Delaney Meigs, MD   06/05/2019 12:49 AM    Chest AP Portable [[id number]] Collected:  06/04/19 2319    Order Status:  Completed Updated:  06/04/19 2321    Narrative:       XR CHEST AP PORTABLE    CLINICAL INDICATION:   sob    TECHNIQUE: Single view chest 1 image    COMPARISON: Chest radiograph 01/31/2019    FINDINGS: Lung volumes are diminished. No  focal consolidations are seen  in the lungs. The cardiomediastinal silhouette is within normal limits  for size and contour. No acute osseous abnormality is identified.      Impression:              1.  Diminished lung volumes.    2.  No focal infiltrate is identified.    William Dalton, MD   06/04/2019 11:19 PM      .    Medical Decision Making   I am the first provider for this patient.    I reviewed the vital signs, available nursing notes, past medical history, past surgical history, family history and social history.    Vital Signs-Reviewed the patient's vital signs.     Patient Vitals for the past 12 hrs:   BP Temp Pulse Resp   06/05/19 0413 118/72 98.3 F (36.8 C) 81 18   06/04/19 2057 106/67 98.2 F (36.8 C) 72 (!) 24       Pulse Oximetry Analysis - Normal 98% on RA       Procedures:   Procedures      EKG:  23:16:39  Normal sinus no STEMI  Compared with prior          Old Medical Records: Old medical records.  Previous electrocardiograms.     ED Course:        Provider Notes:    48 y.o. female history of COPD obstructive sleep apnea presents with vaginal bleeding hip pain and shortness of breath.    Vaginal bleeding etiology unclear.  Unable to visualize right ovary however patient has no tenderness there.  Follow-up outpatient GYN hemodynamically stable does not meet threshold for transfusion.  Start Macrobid for urinary tract infection and have sent off culture    Shortness of breath likely due to COPD.  Chest x-ray clear for pneumonia have repeated COVID.  Advised self quarantine.  Oxygenation is normal on room air.  Given a dose of steroids in ED continue outpatient oral prednisone.    X-ray negative for fracture.  Uses a walker at baseline she is ambulating.    Otherwise stable for discharge    Rx use and side effects, results, home self care, discharge instructions, and return precautions discussed extensively with patient. Possibility of evolving illness reviewed. All  questions solicited and addressed.  Patient is amenable to discharge.    D/w Darcella Cheshire, MD    Discharge Prescriptions     Medication Sig Dispense Auth. Provider    ondansetron (ZOFRAN-ODT) 4 MG disintegrating tablet Take 1 tablet (4 mg total) by mouth every 6 (six) hours as needed for Nausea 8 tablet Paolina Karwowski, Maralyn Sago, PA    nitrofurantoin, macrocrystal-monohydrate, (Macrobid) 100 MG capsule Take 1 capsule (100 mg total) by mouth 2 (two) times daily for 10 days 20 capsule Harlene Salts, PA    predniSONE (DELTASONE) 20 MG tablet Take 2 tablets (40 mg total) by mouth daily for 5 days 10 tablet Harlene Salts, [place]            Diagnosis     Clinical Impression:   1. Vaginal bleeding    2. Right hip pain    3. SOB (shortness of breath)    4. Suspected Covid-19 Virus Infection    5. Acute UTI        Treatment Plan:   ED Disposition     ED Disposition Condition Date/Time Comment    Discharge  Thu Jun 05, 2019  3:51 AM [name] discharge to home/self care.    Condition at disposition: Stable            _______________________________    CHART OWNERSHIPGeralyn Corwin, PA-C, am the primary clinician of record.  _______________________________       Harlene Salts, PA  06/05/19 3995       Darcella Cheshire, MD  06/05/19 [phone]

## 2019-06-04 NOTE — ED Triage Notes (Addendum)
[name] is a 48 y.o. female presents to the ED for vaginal bleeding since 1100 this morning. EMS states VSS en route. States in triage that her LMP was over a year ago and she is passing clots.

## 2019-06-05 ENCOUNTER — Emergency Department: Payer: Medicaid Other

## 2019-06-05 LAB — URINE BHCG POC: Urine bHCG POC: NEGATIVE

## 2019-06-05 LAB — URINALYSIS REFLEX TO MICROSCOPIC EXAM - REFLEX TO CULTURE
Bilirubin, UA: NEGATIVE
Glucose, UA: NEGATIVE
Ketones UA: NEGATIVE
Nitrite, UA: NEGATIVE
Protein, UR: 100 — AB
Specific Gravity UA: 1.009 (ref 1.001–1.035)
Urine pH: 6 (ref 5.0–8.0)
Urobilinogen, UA: NEGATIVE mg/dL (ref 0.2–2.0)

## 2019-06-05 LAB — COMPREHENSIVE METABOLIC PANEL
ALT: 13 U/L (ref 0–55)
AST (SGOT): 22 U/L (ref 5–34)
Albumin/Globulin Ratio: 1 (ref 0.9–2.2)
Albumin: 3.3 g/dL — ABNORMAL LOW (ref 3.5–5.0)
Alkaline Phosphatase: 85 U/L (ref 37–106)
Anion Gap: 7 (ref 5.0–15.0)
BUN: 10 mg/dL (ref 7.0–19.0)
Bilirubin, Total: 0.4 mg/dL (ref 0.2–1.2)
CO2: 18 mEq/L — ABNORMAL LOW (ref 22–29)
Calcium: 8.9 mg/dL (ref 8.5–10.5)
Chloride: 110 mEq/L (ref 100–111)
Creatinine: 0.8 mg/dL (ref 0.6–1.0)
Globulin: 3.2 g/dL (ref 2.0–3.6)
Glucose: 91 mg/dL (ref 70–100)
Potassium: 7 mEq/L (ref 3.5–5.1)
Protein, Total: 6.5 g/dL (ref 6.0–8.3)
Sodium: 135 mEq/L — ABNORMAL LOW (ref 136–145)

## 2019-06-05 LAB — ECG 12-LEAD
Atrial Rate: 70 {beats}/min
P Axis: 19 degrees
P-R Interval: 172 ms
Q-T Interval: 418 ms
QRS Duration: 96 ms
QTC Calculation (Bezet): 451 ms
R Axis: -6 degrees
T Axis: 22 degrees
Ventricular Rate: 70 {beats}/min

## 2019-06-05 LAB — TROPONIN I: Troponin I: <0.01 ng/mL (ref 0.00–0.05)

## 2019-06-05 LAB — GFR: EGFR: >60

## 2019-06-05 LAB — HEMOLYSIS INDEX
Hemolysis Index: 250 — ABNORMAL HIGH (ref 0–18)
Hemolysis Index: 8 (ref 0–18)

## 2019-06-05 LAB — POTASSIUM: Potassium: 4 mEq/L (ref 3.5–5.1)

## 2019-06-05 LAB — LIPASE: Lipase: 9 U/L (ref 8–78)

## 2019-06-05 MED ORDER — OXYCODONE-ACETAMINOPHEN 5-325 MG PO TABS
1.0000 | ORAL_TABLET | Freq: Once | ORAL | Status: AC
Start: 2019-06-05 — End: 2019-06-05
  Administered 2019-06-05: 04:00:00 1 via ORAL
  Filled 2019-06-05: qty 1

## 2019-06-05 MED ORDER — CYCLOBENZAPRINE HCL 10 MG PO TABS
10.0000 mg | ORAL_TABLET | Freq: Three times a day (TID) | ORAL | 0 refills | Status: DC | PRN
Start: 2019-06-05 — End: 2021-03-31

## 2019-06-05 MED ORDER — NITROFURANTOIN MONOHYD MACRO 100 MG PO CAPS
100.00 mg | ORAL_CAPSULE | Freq: Once | ORAL | Status: AC
Start: 2019-06-05 — End: 2019-06-05
  Administered 2019-06-05: 04:00:00 100 mg via ORAL
  Filled 2019-06-05: qty 1

## 2019-06-05 MED ORDER — NITROFURANTOIN MONOHYD MACRO 100 MG PO CAPS
100.00 mg | ORAL_CAPSULE | Freq: Two times a day (BID) | ORAL | 0 refills | Status: AC
Start: 2019-06-05 — End: 2019-06-15

## 2019-06-05 MED ORDER — ONDANSETRON 4 MG PO TBDP
4.00 mg | ORAL_TABLET | Freq: Four times a day (QID) | ORAL | 0 refills | Status: DC | PRN
Start: 2019-06-05 — End: 2020-10-07

## 2019-06-05 MED ORDER — PREDNISONE 20 MG PO TABS
40.00 mg | ORAL_TABLET | Freq: Every day | ORAL | 0 refills | Status: AC
Start: 2019-06-05 — End: 2019-06-10

## 2019-06-05 NOTE — Discharge Instructions (Signed)
Abnormal Vaginal Bleeding    You have been diagnosed with abnormal vaginal bleeding.    Women normally bleed when they have their periods (menstruation). Any other bleeding is called abnormal. This may include bleeding that is heavier than normal or that happens at a time when you dont normally bleed. You may have other symptoms. These could be pelvic/abdominal (belly) pain or cramps. You could also have back pain, nausea (feeling sick to your stomach) or problems urinating (peeing). You may also feel tired or have emotional disturbances.    This is a common problem. Most women have it at some time in their lives.    The cause is usually a mild change in hormone levels. The problem often goes away on its own.    Some other causes are: Changes in birth control use, too much exercise, stress, rapid weight changes, or being overweight. It is also caused by infections, thyroid problems or injury to the vagina/cervix or uterus.    The cause can also be more serious. However, this is rare. More serious causes include cancer of the cervix or uterus. Therefore, it is important to have a follow-up appointment. See your OB-GYN doctor or primary care doctor for the follow-up.    The health care professional who saw you feels it is OK for you to go home.    You may need to return here or go to the nearest Emergency Department if you develop more symptoms. An increase in symptoms might mean you are having complications from your bleeding. These may include very heavy bleeding and/or infection.    It is important to have a follow-up. See your gynecologist or family doctor in the next few days. We may contact your doctor about this abnormal vaginal bleeding evaluation. If not, be sure to tell him or her about it.   If you don't have the right follow-up care available, tell the medical staff before you go. They can help make arrangements for you.    YOU SHOULD SEEK MEDICAL ATTENTION IMMEDIATELY, EITHER HERE OR AT THE  NEAREST EMERGENCY DEPARTMENT, IF ANY OF THE FOLLOWING OCCURS:   You have worse pain in the abdomen (belly), pelvis or back.   Very large amounts of vaginal bleeding, soaking of pads/tampons (more than one pad per hour), passage of large clots.   Fever (temperature higher than 100.55F / 38C), chills, nausea, vomiting.   You're dizzy, lightheaded, or you pass out.             Hip pain    You have hip pain.    There are many different causes of hip pain, such as:   The hip bones are not shaped normally. This is called a structural abnormality.   Infection.   Tumor.   Constant injury to the hip area.   New injury.   Stress fractures caused from putting repeated pressure on the hips while playing sports.   Inflammation.   Sprains.   Inflammation of the tendons (tendonitis).    Sudden injury causing bruises, sprains, strains or broken bones.     More serious causes of hip pain may have been ruled out today with a hip x-ray. These can be broken bones, structural or growth plate abnormalities, or a tumor. Obesity can also cause excess wear and tear on your hips.    Symptoms of hip pain include:   Pain.   Swelling   Bruising or other changes to the skin.   Trouble moving or walking.  If the pain is from an injury, it may take a few weeks to get better.     Care for hip pain includes: Some things you can do at home are:   Rest.   Frequent stretching.   Massage.   Taking pain medication like ibuprofen (Advil or Motrin).   Acetaminophen (Tylenol).   Use a cane, crutches or walker.   Ice or heating pad    Your doctor decided no other treatment was needed today. You may follow-up with your primary care doctor, or you may have been given a referral to a specialist. This could be an orthopedic surgeon (bone doctor), a sports medicine specialist or a neurosurgeon (brain and spine doctor).    SEEK MEDICAL ATTENTION IMMEDIATELY, EITHER HERE OR AT THE NEAREST EMERGENCY DEPARTMENT, IF ANY OF THE  FOLLOWING HAPPENS:     There is a serious increase in pain in the affected area.   You get new numbness and tingling in or under the affected area.   Your hip gets red or swollen.   Your symptoms haven't started to get better in 1-2 weeks.   You get a temperature greater than 100.60F (38C).    If you cant follow up with your doctor, or if you feel you need to be rechecked or seen again, come back here or go to the nearest emergency department.     Follow-up Steps for Patients with Pending COVID-19 Testing - April 22, 2019     Thank you for choosing [facility]. During your visit, you received a COVID-19 test. You should receive your results in 3-5 days.    When will I know the results of my COVID-19 test?  There are multiple ways to see your COVID-19 test results after the 3-5 day waiting period. Please ensure we have your most up to date contact information on file before you leave your appointment.    1. The easiest way to find test results done at an [facility] care site is through your MyChart account, [facility] online patient portal. This is also a good way to connect with your [facility] primary care team for continued care. If you do not already have MyChart activated, you will be given an activation code at the time of testing. Please note that MyChart is unavailable for children age 27-17.    2. Your Primary Care Physician, the physician who ordered your test or another [facility] team member may call you with your test results.    3. If youre having difficulty finding your test results, or if you have additional questions, please contact the COVID-19 Test Results Call Center at [phone] for further instructions. The call center is open daily from [facility].    If you need help activating your MyChart account, please call [phone]. You can download the MyChart application to your phone using the QR codes below:                 What should I do while I wait for my test results?   Take good care of  yourself and be mindful of the safety of those around you.   Rest and stay well hydrated.   Use acetaminophen (Tylenol) to help manage fever.   Wash your hands often.   Stay at home except to receive medical care.   Call ahead before seeing your doctor, medical provider, or when seeking medical care at any care site.   Isolate yourself from others as much as possible.  Clean all "high touch" surfaces daily--such as tabletops, handles/doorknobs, and phones,    Instructions continue on next page     Most people with COVID-19 will get better; however, a small number may require more treatment. Please contact your doctor or present to the nearest Emergency Department if you develop the warning signs of more severe infection such as:    ? A fever over 102 ?F not controlled by acetaminophen (Tylenol)  ? Shortness of breath or chest pain  ? Worsening cough  ? Increasing weakness  ? Inability to tolerate oral fluids    Some general things to think about:   If you have a medical emergency and need to call 911, notify the person answering the phone that you may have COVID-19. If possible, put on a facemask before the ambulance arrives.     Persons placed under active monitoring or facilitated self-monitoring should follow instructions provided by their local health department or occupational health professionals, as appropriate.    When can I stop self-isolation?  Until you receive your results, you should remain home and avoid contact with others (self-isolation).  The decision to stop isolation precautions should be made on a case-by-case basis, in consultation with healthcare providers and state and local health departments.     If your test result is negative, you should still self-isolate until 3 days after your symptoms stop.   If your test result is positive, you should stay home for at least 10 days from the day your symptoms started. If you are still having symptoms at the end of 10 days, continue  in-home isolation until 3 days after your symptoms stop. Talk to your healthcare provider to determine what follow-up is needed.  You can also schedule telemedicine visits with your primary care physician to discuss ongoing symptoms or new concerns that may arise.    For further information, please consult the following websites:  Centers for Disease Control and Prevention (CDC)  [email or web address] Department of Health  AdvisorRank.[email or web address]    If you are ill and need to see a healthcare provider, but your primary physician is not available, the [facility] are open for walk in visits.  Open Monday - Friday, 8am - 8pm at the following locations:   [facility]: [address] [place], [place] [address], [phone]   [facility]: [address] [place], [place] [address], [phone]    Open Daily 8am - 8pm including weekends at the following location:   [facility]: [address] [place], [place] [address], [facility] can care for patients with all conditions, including COVID-19.  Do not hesitate to seek care should you have a healthcare emergency.

## 2019-06-06 ENCOUNTER — Telehealth: Payer: Self-pay

## 2019-06-06 LAB — COVID-19 (SARS-COV-2): SARS CoV 2 Overall Result: DETECTED — AB

## 2019-06-06 NOTE — Telephone Encounter (Signed)
COVID-19 Test Results Notification Team    RN placed call to patient in an effort to provide COVID-19 POSITIVE test result.  Attempted to reach patient at the following numbers:  [phone]   Dialed twice unable to leave a message, recording states that the call can not be completed at this time.    Called EC - mother Corine Shelter at [phone], identified myself and attempted to leave a message to have patient call me back. She informed me that she is at the store and asked to be called back at a later time.    RN will continue efforts to contact patient.    Emilee Market BSN,RN-BC  COVID-19 Notification Team  Direct [phone]  COVID-19 Test Results Call Center : [phone]

## 2019-06-07 ENCOUNTER — Telehealth: Payer: Self-pay

## 2019-06-07 NOTE — Telephone Encounter (Signed)
COVID-19 Test Results Notification Team    RN placed call to patient in an effort to provide COVID-19 POSITIVE test result.  Attempted to reach patient at the following [phone] (home) -Received automated message " The person you are trying to reach is not able to receive calls at this time. Unable to leave a voice message.       RN will continue efforts to contact patient.      RN placed second call to patient's emergency contacts in an effort to provide COVID-19 test result.  Reach the following emergency contacts: [name], [name], mother, [phone]    Left the following voicemail for emergency contact:    My name is Cloie Wooden and I am an RN at [facility] attempting to get in contact with [name].  Can you please have Dorena call me back as soon as possible at [phone] number if you are able to assist me in reaching your daughter [name].  My telephone number is [phone] and I can be reached between the hours of 8:30am--4:30pm.  Thank you. Mother provided alternative contact phone number, [phone] for patient. She stated she would reach out to her daughter and make her aware she is receiving a call to answer.     Will notify patient of test result upon return call.      RN spoke with patient, [phone] to provide POSITIVE COVID-19 test result.    After verifying patient name, address and DOB, patient was notified that test results are positive for COVID-19.    Patient was advised to follow the CDC Guidelines for 10 things you can do to manage your COVID-19 symptoms at home.  Reviewed the following CDC Guidelines with the patient:     Stay home from work, school and public places.   Get rest and stay hydrated.   If you have an appointment, call the healthcare provider ahead of time and notify them that you have COVID-19.   For medical emergencies, call 911 and notify dispatch that you have COVID-19.   Coverage cough and sneezes   Wash your hands often--20  seconds or with sanitizer that contains at least 60% alcohol.   Stay in a specific room and away from other people in your home.  Wear a mask if you are around people.   Avoid sharing personal items (dishes, towels, bedding).   Clean all surfaces that are touched often.    Monitor your symptoms and contact primary care   o Monitor emergency warning signs--persistent fever, trouble breathing, persistent pain or pressure in the chest, new confusion or inability to arouse, bluish lips or face, etc.   o Call 911 if having an emergency   Do not discontinue home isolation until directed by primary care provider or health department  o General guidance:    - If symptoms are present:   Self-isolate until at least 3 days (72 hours) have passed since recovery defined as resolution of fever without the use of fever-reducing medications and improvement in respiratory symptoms (e.g., cough, shortness of breath); and, at least 10 days have passed since symptoms first appeared OR  - If no symptoms are present:   Self-isolate until at least 10 days have passed since the date of your first positive COVID-19 test and have had no subsequent illness    Confirmed that patient's primary care physician is Virgel Bouquet, MD at [phone] number.  RN unable to route Epic note to primary care physician, provider router options are  not made available.        Answered all patient questions.     Advised patient to contact their primary care physician or Urgent Care if no PCP in the event that patient's symptoms change or worsen.      Reminded patient that the emergency room is always available in the event of an emergency.        Shirlean Schlein BSN, RN  [facility]  [address] [place], [place] [address]  [place]: [place]

## 2019-06-07 NOTE — Telephone Encounter (Signed)
COVID-19 Test Results Notification Team    Received call back from patient inquiring on the accuracy of her COVID-19 POSITIVE test result. Patient stated she was told at the clinic it would take 3 to 5 days for her test result to come back and wanted to ensure her test result stated the correct information. Patient reported her previous test done two months ago after admitted for pneumonia, she tested negative. Patient expressed concerns living at home with her boyfriend and his mother.  Patient denied active symptoms of COPD. Patient reported she has been around her boyfriend and his mother for a while. She reported they too do not show COVID-19 signs/symptoms. Patient was reminded to follow up with her PCP. Patient is aware in case of SOB or an emergency she is to notify her PCP or go to the emergency. Reinforced the CDC Guidelines for 10 things you can do to manage your COVID-19 symptoms at home. Patient could be heard repeating the information back to her loved ones at home.     Patient denied additional needs or concerns.       Shirlean Schlein BSN, RN  [facility]  [address] [place], [place] [address]  [place]: [place]

## 2019-07-01 ENCOUNTER — Emergency Department: Payer: Medicaid Other

## 2019-07-01 ENCOUNTER — Emergency Department
Admission: EM | Admit: 2019-07-01 | Discharge: 2019-07-01 | Disposition: A | Discharge status: Home / Self Care | Payer: Medicaid Other | Attending: Emergency Medicine | Admitting: Emergency Medicine

## 2019-07-01 DIAGNOSIS — I252 Old myocardial infarction: Secondary | ICD-10-CM | POA: Insufficient documentation

## 2019-07-01 DIAGNOSIS — Z87891 Personal history of nicotine dependence: Secondary | ICD-10-CM | POA: Insufficient documentation

## 2019-07-01 DIAGNOSIS — Z8619 Personal history of other infectious and parasitic diseases: Secondary | ICD-10-CM | POA: Insufficient documentation

## 2019-07-01 DIAGNOSIS — J440 Chronic obstructive pulmonary disease with acute lower respiratory infection: Secondary | ICD-10-CM | POA: Insufficient documentation

## 2019-07-01 DIAGNOSIS — Z7951 Long term (current) use of inhaled steroids: Secondary | ICD-10-CM | POA: Insufficient documentation

## 2019-07-01 DIAGNOSIS — J209 Acute bronchitis, unspecified: Secondary | ICD-10-CM | POA: Insufficient documentation

## 2019-07-01 MED ORDER — ALBUTEROL SULFATE HFA 108 (90 BASE) MCG/ACT IN AERS
3.00 | INHALATION_SPRAY | Freq: Once | RESPIRATORY_TRACT | Status: AC
Start: 2019-07-01 — End: 2019-07-01
  Administered 2019-07-01: 14:00:00 3 via RESPIRATORY_TRACT

## 2019-07-01 MED ORDER — ALBUTEROL SULFATE HFA 108 (90 BASE) MCG/ACT IN AERS
2.0000 | INHALATION_SPRAY | RESPIRATORY_TRACT | 0 refills | Status: AC | PRN
Start: 2019-07-01 — End: 2019-07-31

## 2019-07-01 MED ORDER — PREDNISONE 20 MG PO TABS
60.00 mg | ORAL_TABLET | Freq: Once | ORAL | Status: AC
Start: 2019-07-01 — End: 2019-07-01
  Administered 2019-07-01: 13:00:00 60 mg via ORAL
  Filled 2019-07-01: qty 3

## 2019-07-01 MED ORDER — PREDNISONE 20 MG PO TABS
60.00 mg | ORAL_TABLET | Freq: Every day | ORAL | 0 refills | Status: AC
Start: 2019-07-01 — End: 2019-07-05

## 2019-07-01 MED ORDER — AZITHROMYCIN 250 MG PO TABS
ORAL_TABLET | ORAL | 0 refills | Status: AC
Start: 2019-07-01 — End: 2019-07-06

## 2019-07-01 NOTE — Discharge Instructions (Signed)
Bronchitis     You have been diagnosed with bronchitis.     Bronchitis is an irritation of the large breathing tubes. It can be caused by tobacco smoke, air pollution, or an infection. Patients with bronchitis are short of breath and may cough up green or yellow mucous. These symptoms are usually worse at night, when lying flat and in wet weather. Most people with bronchitis do not need antibiotics. If your doctor prescribes antibiotics, fill the prescription and take all the medicine according to the instructions.     Bronchitis is usually treated with medicine to help stop coughing. An inhaler with albuterol (Ventolin®/Proventil®) is sometimes used to help with cough. It is best to use the inhaler with a spacer to help the medicine reach your lungs. The doctor can prescribe a spacer.     Bronchitis is usually caused by a virus. Antibiotics do not kill viruses. In fact, antibiotics do not affect viruses in any way. In the past, some doctors prescribed antibiotics for people with bronchitis. We now know that antibiotics are not helpful for most bronchitis patients. Patients who might need antibiotics are those with lung problems that don’t go away, like emphysema or COPD.     Your coughing and wheezing might last for 2 or 3 weeks! The symptoms should get better over this time period and not worse.     Your doctor prescribed an albuterol (Ventolin®/Proventil®) inhaler. Use it every four hours if you are wheezing, coughing, or short of breath. This will reduce symptoms.     Do not smoke. Research shows that smoking causes heart disease, cancer, and birth defects. Avoiding smoking will help your asthma. If you smoke, ask your doctor for ideas about how to stop.  · If you do not smoke, avoid others who do.     YOU SHOULD SEEK MEDICAL ATTENTION IMMEDIATELY, EITHER HERE OR AT THE NEAREST EMERGENCY DEPARTMENT, IF ANY OF THE FOLLOWING OCCURS:  · You wheeze or have trouble breathing.  · You have a fever (temperature higher  than 100.4ºF / 38ºC), that won’t go away.  · You have chest pain.  · You vomit or cannot keep liquids down or you feel weak or dizzy.  · Your symptoms get worse over the next 2 or 3 days.

## 2019-07-01 NOTE — ED Provider Notes (Signed)
EMERGENCY DEPARTMENT HISTORY AND PHYSICAL EXAM     None        Date: 07/01/2019  Patient Name: [name]    History of Presenting Illness     Chief Complaint   Patient presents with    Chest congestion       History Provided By: patient    Chief Complaint: cough  Onset: yesterday  Timing: gradual  Location: chest  Quality: congested  Severity: moderate  Modifying Factors: has copd  Associated Symptoms: none    Additional History: [name] is a 48 y.o. female.with copd, recovered from covid pneumonia appx 1 month ago. No cp, no fever    PCP: Virgel Bouquet, MD      No current facility-administered medications for this encounter.      Current Outpatient Medications   Medication Sig Dispense Refill    albuterol (PROVENTIL HFA;VENTOLIN HFA) 108 (90 Base) MCG/ACT inhaler Inhale 2 puffs into the lungs every 4 (four) hours as needed for Wheezing 1 Inhaler 0    albuterol sulfate HFA (PROVENTIL) 108 (90 Base) MCG/ACT inhaler Inhale 2 puffs into the lungs every 4 (four) hours as needed for Wheezing or Shortness of Breath (coughing) Dispense with spacer 1 Inhaler 0    albuterol-ipratropium (DUO-NEB) 2.5-0.5(3) mg/3 mL nebulizer Take 3 mLs by nebulization every 6 (six) hours as needed (for SOB or Wheezing) 90 mL 0    ALPRAZolam (XANAX) 0.5 MG tablet Take 0.5 mg by mouth 2 (two) times daily as needed         atomoxetine (STRATTERA) 10 MG capsule Take 80 mg by mouth daily         azithromycin (ZITHROMAX) 250 MG tablet Take 2 tablets (500 mg total) by mouth daily for 1 day, THEN 1 tablet (250 mg total) daily for 4 days. 6 tablet 0    benzonatate (TESSALON) 200 MG capsule Take 1 capsule (200 mg total) by mouth 3 (three) times daily as needed for Cough 30 capsule 0    cyclobenzaprine (FLEXERIL) 10 MG tablet Take 1 tablet (10 mg total) by mouth 3 (three) times daily as needed for Muscle spasms 6 tablet 0    famotidine (PEPCID) 20 MG tablet Take 1 tablet (20 mg total) by mouth every 12 (twelve) hours 60  tablet 0    fluticasone-salmeterol (ADVAIR DISKUS) 500-50 MCG/DOSE Aerosol Powder, Breath Activtivatede Inhale 1 puff into the lungs 2 (two) times daily 1 puff 0    gabapentin (NEURONTIN) 800 MG tablet Take 800 mg by mouth 3 (three) times daily      guaiFENesin (MUCINEX) 600 MG 12 hr tablet Take 2 tablets (1,200 mg total) by mouth 2 (two) times daily 14 tablet 0    indomethacin (INDOCIN) 25 MG capsule Take 50 mg by mouth 3 (three) times daily with meals      lactobacillus/streptococcus (RISAQUAD) Cap Take 1 capsule by mouth daily 30 capsule 0    naloxone (NARCAN) 4 MG/0.1ML nasal spray 1 spray intranasally. If pt does not respond or relapses into respiratory depression call 911. Give additional doses every 2-3 min. 2 each 0    ondansetron (ZOFRAN-ODT) 4 MG disintegrating tablet Take 1 tablet (4 mg total) by mouth every 6 (six) hours as needed for Nausea 8 tablet 0    oxybutynin (DITROPAN) 5 MG tablet Take 5 mg by mouth 3 (three) times daily      oxyCODONE-acetaminophen (PERCOCET) 10-325 MG per tablet Take 1 tablet by mouth every 4 (four) hours as  needed for Pain 15 tablet 0    predniSONE (DELTASONE) 20 MG tablet Take 3 tablets (60 mg total) by mouth daily for 4 days 12 tablet 0    risperiDONE (RISPERDAL) 3 MG tablet Take 3 mg by mouth 2 (two) times daily      sertraline (ZOLOFT) 100 MG tablet Take 100 mg by mouth 2 (two) times daily            Past History     Past Medical History:  Past Medical History:   Diagnosis Date    Anxiety     Chronic obstructive pulmonary disease     Myocardial infarction     Panic attacks        Past Surgical History:  Past Surgical History:   Procedure Laterality Date    APPENDECTOMY      ARTHROSCOPIC KNEE, ACL RECONSTRUCTION      R. knee     CHOLECYSTECTOMY         Family History:  Family History   Problem Relation Age of Onset    Heart attack Father     Diabetes Father     Asthma Sister     Diabetes Maternal Grandmother     Diabetes Paternal Grandmother      Heart disease Paternal Grandmother        Social History:  Social History     Tobacco Use    Smoking status: Former Smoker     Packs/day: 1.00     Years: 25.00     Pack years: 25.00     Types: Cigarettes     Last attempt to quit: 12/29/2018     Years since quitting: 0.5    Smokeless tobacco: Never Used   Substance Use Topics    Alcohol use: Never     Frequency: Never    Drug use: Never       Allergies:  Allergies   Allergen Reactions    Iodine Solution [Povidone Iodine]      Contrast dye        Review of Systems     Physical Exam   Constitutional: She is oriented to person, place, and time and well-developed, well-nourished, and in no distress. No distress.   HENT:   Head: Normocephalic and atraumatic.   Eyes: Conjunctivae are normal. Right eye exhibits no discharge. Left eye exhibits no discharge. No scleral icterus.   Cardiovascular: Normal rate, regular rhythm and intact distal pulses.   Pulmonary/Chest: Effort normal and breath sounds normal. No respiratory distress. She has no wheezes. She has no rales.   Abdominal: Soft. She exhibits no distension. There is no abdominal tenderness.   Neurological: She is alert and oriented to person, place, and time. GCS score is 15.   Skin: Skin is warm and dry. She is not diaphoretic.   Psychiatric: Mood and affect normal.       Physical Exam   BP 131/86    Pulse 73    Temp 98.4 F (36.9 C) (Oral)    Resp 20    Ht _0  (1.753 m)    Wt 147.9 kg    SpO2 98%    BMI 48.14 kg/m     Review of Systems   Constitutional: Negative for chills and fever.   HENT: Negative for sore throat.    Respiratory: Positive for cough. Negative for shortness of breath.    Cardiovascular: Negative for chest pain.   Gastrointestinal: Negative for nausea and vomiting.   All other  systems reviewed and are negative.        Diagnostic Study Results     Labs -     Results     ** No results found for the last 24 hours. **          Radiologic Studies -   Radiology Results (24 Hour)     Procedure  Component Value Units Date/Time    XR Chest  AP Portable [[id number]] Collected:  07/01/19 1321    Order Status:  Completed Updated:  07/01/19 1324    Narrative:       XR CHEST AP PORTABLE    CLINICAL INDICATION: cough     TECHNIQUE: The following radiographs obtained per protocol:XR CHEST AP  PORTABLE    COMPARISON: 06/04/2019    FINDINGS:    Cardiomediastinal silhouette is within normal limits.  No infiltrate. No  effusion. The lungs are clear.  No acute abnormalities are apparent. No  active disease.No significant interval change.      Impression:           No acute abnormality.    Leonard Downing, MD   07/01/2019 1:22 PM      .      Medical Decision Making   I am the first provider for this patient.    I reviewed the vital signs, available nursing notes, past medical history, past surgical history, family history and social history.    Vital Signs-Reviewed the patient's vital signs.     Patient Vitals for the past 12 hrs:   BP Temp Pulse Resp   07/01/19 1218 131/86 -- 73 20   07/01/19 1147 -- 98.4 F (36.9 C) 64 22       Pulse Oximetry Analysis - 98% on RA  ED Course: informed of xray results, plan for steroids/albuterol inhaler/antibiotics. To f/u with her pmd    Provider Notes:     Procedures:      Diagnosis     Clinical Impression:   1. Acute bronchitis, unspecified organism        _______________________________    Attestations:  This note is prepared by Linard Millers acting as Scribe for Christen Butter, MD.    Christen Butter, MD.  The scribe's documentation has been prepared under my direction and personally reviewed by me in its entirety.  I confirm that the note above accurately reflects all work, treatment, procedures, and medical decision making performed by me.    _______________________________         French Ana, MD  07/01/19 1622

## 2019-07-01 NOTE — EDIE (Signed)
COLLECTIVE?NOTIFICATION?07/01/2019 11:30?[place], [facility] L?MRN: [id number]    Criteria Met      COVID-19 Positive Lab Results    Security and Safety  No recent Security Events currently on file    ED Care Guidelines  There are currently no ED Care Guidelines for this patient. Please check your facility's medical records system.    Flags      Positive COVID-19 Lab Result - [facility] - A specimen collected from this patient was positive for COVID-19 / Attributed By: [place] [facility] / Attributed On: 06/10/2019       Prescription Monitoring Program  PDMP query found no report.  Narx Score not available at this time.      E.D. Visit Count (12 mo.)  Facility Visits   [facility] 4   Total 4   Note: Visits indicate total known visits.      Recent Emergency Department Visit Summary  Date Facility [facility] Type Diagnoses or Chief Complaint   Jul 01, 2019 [place]. [facility]. [facility]      COVID test      Jun 04, 2019 [place]. Alexa. [facility]      Vag bleeding ,abd cramping      Hip Pain      Vaginal Bleeding      Shortness of breath      Urinary tract infection, site not specified      Pain in right hip      Other general symptoms and signs      Abnormal uterine and vaginal bleeding, unspecified      Jan 31, 2019 [place]. Alexa. [facility]      sob, cough, chest pain      Cough      Shortness of Breath      Chest pain, unspecified      Chronic obstructive pulmonary disease with (acute) exacerbati      Pneumonia, unspecified organism      Jan 01, 2019 [place]. Alexa. [facility]      SOB      Medic-SOB      Shortness of Breath      Personal history of other diseases of the respiratory system      Shortness of breath          Recent Inpatient Visit Summary  Date [place] Type Diagnoses or Chief Complaint   Jan 31, 2019 [place]. Alexa. [facility]      Pneumonia, unspecified organism      Chronic obstructive  pulmonary disease with (acute) exacerbati      Chest pain, unspecified      Jan 01, 2019 [place]. Alexa. [facility]      Personal history of other diseases of the respiratory system      Shortness of breath          Care Team  There is not a care team on record at this time.   Collective Portal  This patient has registered at the [facility] Emergency Department   For more information visit: [email or web address]     PLEASE NOTE:     1.   Any care recommendations and other clinical information are provided as guidelines or for historical purposes only, and providers should exercise their own clinical judgment when providing care.    2.   You may only use this information  for purposes of treatment, payment or health care operations activities, and subject to the limitations of applicable [employer] Policies.    3.   You should consult directly with the organization that provided a care guideline or other clinical history with any questions about additional information or accuracy or completeness of information provided.    ? 2020 [employer] - [email or web address]

## 2019-07-01 NOTE — ED Triage Notes (Signed)
Patient presents to the ED with cc: Chest Congestion/Cough x 3 days. Patient requesting Repeat COVID Test. Pt tested positive for COVID July 2020.

## 2019-07-02 ENCOUNTER — Telehealth: Payer: Self-pay | Admitting: Emergency Medicine

## 2019-07-02 NOTE — Telephone Encounter (Signed)
Called patient for well check, phone number not receiving calls.

## 2019-07-03 LAB — ECG 12-LEAD
Atrial Rate: 98 {beats}/min
P Axis: 20 degrees
P-R Interval: 140 ms
Q-T Interval: 532 ms
QRS Duration: 88 ms
QTC Calculation (Bezet): 679 ms
R Axis: 0 degrees
T Axis: 29 degrees
Ventricular Rate: 98 {beats}/min

## 2019-07-10 ENCOUNTER — Emergency Department
Admission: EM | Admit: 2019-07-10 | Discharge: 2019-07-10 | Disposition: A | Discharge status: Home / Self Care | Payer: Medicaid Other | Attending: Emergency Medicine | Admitting: Emergency Medicine

## 2019-07-10 DIAGNOSIS — M25561 Pain in right knee: Secondary | ICD-10-CM | POA: Insufficient documentation

## 2019-07-10 DIAGNOSIS — G8929 Other chronic pain: Secondary | ICD-10-CM | POA: Insufficient documentation

## 2019-07-10 DIAGNOSIS — M25562 Pain in left knee: Secondary | ICD-10-CM | POA: Insufficient documentation

## 2019-07-10 DIAGNOSIS — I252 Old myocardial infarction: Secondary | ICD-10-CM | POA: Insufficient documentation

## 2019-07-10 DIAGNOSIS — Z87891 Personal history of nicotine dependence: Secondary | ICD-10-CM | POA: Insufficient documentation

## 2019-07-10 MED ORDER — INDOMETHACIN 25 MG PO CAPS
50.00 mg | ORAL_CAPSULE | Freq: Once | ORAL | Status: AC
Start: 2019-07-10 — End: 2019-07-10
  Administered 2019-07-10: 17:00:00 50 mg via ORAL
  Filled 2019-07-10: qty 2

## 2019-07-10 MED ORDER — INDOMETHACIN 50 MG PO CAPS
50.00 mg | ORAL_CAPSULE | Freq: Two times a day (BID) | ORAL | 0 refills | Status: AC
Start: 2019-07-10 — End: 2019-07-20

## 2019-07-10 MED ORDER — OXYCODONE-ACETAMINOPHEN 5-325 MG PO TABS
1.0000 | ORAL_TABLET | Freq: Once | ORAL | Status: AC
Start: 2019-07-10 — End: 2019-07-10
  Administered 2019-07-10: 17:00:00 1 via ORAL
  Filled 2019-07-10: qty 1

## 2019-07-10 NOTE — ED Provider Notes (Signed)
EMERGENCY DEPARTMENT HISTORY AND PHYSICAL EXAM     Physician/Midlevel provider first contact with patient: 07/10/19 1625         Date: 07/10/2019  Patient Name: [name]    History of Presenting Illness     Chief Complaint   Patient presents with    Knee Pain       History Provided By: Patient    Chief Complaint: [name] is a 48 y.o. female who presents to the ED c/o chronic bilateral knee pain for approximately 2 years.  She reports no recent injury.  She states that she was told by a surgeon that she needs knee replacement surgery that she must lose weight before surgery can be performed.  She reports that she has been taking Percocet for the pain.  She reports that she needs more pain medication and a pain management referral.  She states that she has taken Indocin in the past that is helped her pain.  She states that her pain is currently 4/10, nonradiating, constant, and worse with movement.    PCP: Virgel Bouquet, MD        No current facility-administered medications for this encounter.      Current Outpatient Medications   Medication Sig Dispense Refill    albuterol (PROVENTIL HFA;VENTOLIN HFA) 108 (90 Base) MCG/ACT inhaler Inhale 2 puffs into the lungs every 4 (four) hours as needed for Wheezing 1 Inhaler 0    albuterol sulfate HFA (PROVENTIL) 108 (90 Base) MCG/ACT inhaler Inhale 2 puffs into the lungs every 4 (four) hours as needed for Wheezing or Shortness of Breath (coughing) Dispense with spacer 1 Inhaler 0    albuterol-ipratropium (DUO-NEB) 2.5-0.5(3) mg/3 mL nebulizer Take 3 mLs by nebulization every 6 (six) hours as needed (for SOB or Wheezing) 90 mL 0    ALPRAZolam (XANAX) 0.5 MG tablet Take 0.5 mg by mouth 2 (two) times daily as needed         atomoxetine (STRATTERA) 10 MG capsule Take 80 mg by mouth daily         benzonatate (TESSALON) 200 MG capsule Take 1 capsule (200 mg total) by mouth 3 (three) times daily as needed for Cough 30 capsule 0    cyclobenzaprine (FLEXERIL)  10 MG tablet Take 1 tablet (10 mg total) by mouth 3 (three) times daily as needed for Muscle spasms 6 tablet 0    famotidine (PEPCID) 20 MG tablet Take 1 tablet (20 mg total) by mouth every 12 (twelve) hours 60 tablet 0    fluticasone-salmeterol (ADVAIR DISKUS) 500-50 MCG/DOSE Aerosol Powder, Breath Activtivatede Inhale 1 puff into the lungs 2 (two) times daily 1 puff 0    gabapentin (NEURONTIN) 800 MG tablet Take 800 mg by mouth 3 (three) times daily      guaiFENesin (MUCINEX) 600 MG 12 hr tablet Take 2 tablets (1,200 mg total) by mouth 2 (two) times daily 14 tablet 0    indomethacin (INDOCIN) 50 MG capsule Take 1 capsule (50 mg total) by mouth 2 (two) times daily with meals for 10 days 20 capsule 0    lactobacillus/streptococcus (RISAQUAD) Cap Take 1 capsule by mouth daily 30 capsule 0    naloxone (NARCAN) 4 MG/0.1ML nasal spray 1 spray intranasally. If pt does not respond or relapses into respiratory depression call 911. Give additional doses every 2-3 min. 2 each 0    ondansetron (ZOFRAN-ODT) 4 MG disintegrating tablet Take 1 tablet (4 mg total) by mouth every 6 (six) hours as  needed for Nausea 8 tablet 0    oxybutynin (DITROPAN) 5 MG tablet Take 5 mg by mouth 3 (three) times daily      oxyCODONE-acetaminophen (PERCOCET) 10-325 MG per tablet Take 1 tablet by mouth every 4 (four) hours as needed for Pain 15 tablet 0    risperiDONE (RISPERDAL) 3 MG tablet Take 3 mg by mouth 2 (two) times daily      sertraline (ZOLOFT) 100 MG tablet Take 100 mg by mouth 2 (two) times daily            Past History     Past Medical History:  Past Medical History:   Diagnosis Date    Anxiety     Chronic obstructive pulmonary disease     Myocardial infarction     Panic attacks        Past Surgical History:  Past Surgical History:   Procedure Laterality Date    APPENDECTOMY      ARTHROSCOPIC KNEE, ACL RECONSTRUCTION      R. knee     CHOLECYSTECTOMY         Family History:  Family History   Problem Relation Age of  Onset    Heart attack Father     Diabetes Father     Asthma Sister     Diabetes Maternal Grandmother     Diabetes Paternal Grandmother     Heart disease Paternal Grandmother        Social History:  Social History     Tobacco Use    Smoking status: Former Smoker     Packs/day: 1.00     Years: 25.00     Pack years: 25.00     Types: Cigarettes     Last attempt to quit: 12/29/2018     Years since quitting: 0.5    Smokeless tobacco: Never Used   Substance Use Topics    Alcohol use: Never     Frequency: Never    Drug use: Never       Allergies:  Allergies   Allergen Reactions    Iodine Solution [Povidone Iodine]      Contrast dye        Review of Systems     Review of Systems   Constitutional: Negative for chills and fever.   HENT: Negative for ear discharge, ear pain and nosebleeds.    Eyes: Negative for discharge and redness.   Respiratory: Negative for cough and shortness of breath.    Cardiovascular: Negative for chest pain and palpitations.   Gastrointestinal: Negative for abdominal pain, diarrhea and vomiting.   Genitourinary: Negative.    Musculoskeletal: Positive for joint pain (Bilateral knee pain-chronic). Negative for neck pain.   Skin: Negative for rash.   Neurological: Negative for seizures, loss of consciousness and headaches.   Psychiatric/Behavioral: Negative for suicidal ideas. The patient is nervous/anxious.        Physical Exam   BP 137/83    Pulse (!) 102    Temp 98.4 F (36.9 C) (Oral)    Resp 20    SpO2 95%     Physical Exam   Constitutional: She is oriented to person, place, and time. She appears well-developed and well-nourished. She is active and cooperative. She is easily aroused.  Non-toxic appearance. She does not have a sickly appearance. She does not appear ill. No distress.   HENT:   Head: Normocephalic and atraumatic.   Right Ear: External ear normal.   Left Ear: External ear normal.  Eyes: Pupils are equal, round, and reactive to light. Conjunctivae and EOM are normal. Right eye  exhibits no discharge. Left eye exhibits no discharge.   Neck: Normal range of motion. Neck supple.   Cardiovascular: Normal rate, regular rhythm and intact distal pulses.   Pulmonary/Chest: Effort normal. No respiratory distress.   Musculoskeletal:      Right knee: She exhibits bony tenderness. She exhibits normal range of motion, no swelling, no effusion, no ecchymosis, no deformity, no laceration, no erythema and normal alignment. No medial joint line, no lateral joint line and no patellar tendon tenderness noted.      Left knee: She exhibits bony tenderness. She exhibits normal range of motion, no swelling, no effusion, no ecchymosis, no deformity, no laceration, no erythema, normal alignment, no LCL laxity, normal meniscus and no MCL laxity. No medial joint line, no lateral joint line and no patellar tendon tenderness noted.        Legs:       Comments: Distal neuromotor vascular status is intact.   Neurological: She is alert, oriented to person, place, and time and easily aroused. She has normal strength. She is not disoriented. No sensory deficit. Coordination and gait normal. GCS eye subscore is 4. GCS verbal subscore is 5. GCS motor subscore is 6.   Skin: Skin is warm and dry.   Psychiatric: She has a normal mood and affect. Her speech is normal and behavior is normal. Judgment and thought content normal. Cognition and memory are normal.   Nursing note and vitals reviewed.        Medical Decision Making   I am the first provider for this patient.    I reviewed today's vital signs and ED nursing notes.    Vital Signs-Reviewed the patient's vital signs.   Vital Signs     Date and Time BP Pulse Resp Temp Temp 2 SpO2 Pulse (SpO2) O2 Flow Rate (L/min) Pain Score Weight BMI (calculated) User   07/10/19 1518 137/83 102 20 98.4 F (36.9 C) -- 95 % -- -- -- -- -- RM         Pulse Oximetry Analysis - Normal, 95% on RA    Personal Protective Equipment (PPE): Gloves, Goggles, N95 and Surgical / [place]  Cap      Discharge:     Discussed  and counseled on the diagnosis, f/u plans, medication use, and signs and symptoms when to return to ED.  Pt is stable and ready for discharge. All questions solicited and addressed.      Diagnosis     Clinical Impression:   1. Bilateral chronic knee pain    2. Morbid obesity            This chart was generated using hospital voice-recognition software which does not employ spell-checking or grammar-checking features. It was dictated, all or in part, in a busy and often noisy patient care environment. I have taken all usual measures to dictate carefully and to review all aspects this chart. Nonetheless, given the known and well-documented performance characteristics of VR software in such patient care environments, this dictation still may contain unrecognized and wholly unintended errors or omissions  _______________________________           Truddie Hidden, PA  07/11/19 0409       Arvilla Meres, MD  07/11/19 1051

## 2019-07-10 NOTE — ED Triage Notes (Signed)
[name] is a 48 y.o. female c/o bilateral knee pain. Needs surgery on her knees for bone on bone rubbing but needs to lose weight first. Has been taking percocet for pain    BP 137/83    Pulse (!) 102    Temp 98.4 F (36.9 C) (Oral)    Resp 20    SpO2 95%

## 2019-07-10 NOTE — EDIE (Signed)
COLLECTIVE?NOTIFICATION?07/10/2019 15:06?[name], [name]?MRN: [id number]    Criteria Met      5 ED Visits in 12 Months    COVID-19 Positive Lab Results    Security and Safety  No recent Security Events currently on file    ED Care Guidelines  There are currently no ED Care Guidelines for this patient. Please check your facility's medical records system.    Flags      Positive COVID-19 Lab Result - [facility] - A specimen collected from this patient was positive for COVID-19 / Attributed By: [facility] [facility] / Attributed On: 06/10/2019       Prescription Monitoring Program  000??- Narcotic Use Score  000??- Sedative Use Score  000??- Stimulant Use Score  000??- Overdose Risk Score  - All Scores range from 000-999 with 75% of the population scoring < 200 and on 1% scoring above 650  - The last digit of the narcotic, sedative, and stimulant score indicates the number of active prescriptions of that type  - Higher Use scores correlate with increased prescribers, pharmacies, mg equiv, and overlapping prescriptions  - Higher Overdose Risk Scores correlate with increased risk of unintentional overdose death   Concerning or unexpectedly high scores should prompt a review of the PMP record; this does not constitute checking PMP for prescribing purposes.      E.D. Visit Count (12 mo.)  Facility Visits   [facility] 5   Total 5   Note: Visits indicate total known visits.      Recent Emergency Department Visit Summary  Date Facility [facility] Type Diagnoses or Chief Complaint   Jul 10, 2019 [place]. [facility]      knee pain      Jul 01, 2019 [place]. [facility]. [facility]      COVID test      Chest tightness, cough COVID test      Chest congestion      Acute bronchitis, unspecified      Jun 04, 2019 [place]. [facility]. [facility]      Vag bleeding ,abd cramping      Hip Pain      Vaginal Bleeding      Shortness of breath      Urinary tract infection, site not  specified      Pain in right hip      Other general symptoms and signs      Abnormal uterine and vaginal bleeding, unspecified      Jan 31, 2019 [place]. [facility]. [facility]      sob, cough, chest pain      Cough      Shortness of Breath      Chest pain, unspecified      Chronic obstructive pulmonary disease with (acute) exacerbati      Pneumonia, unspecified organism      Jan 01, 2019 [place]. [facility]. [facility]      SOB      Medic-SOB      Shortness of Breath      Personal history of other diseases of the respiratory system      Shortness of breath          Recent Inpatient Visit Summary  Date [place] Type Diagnoses or Chief Complaint   Jan 31, 2019 [place]. [facility]. [facility]      Pneumonia, unspecified organism  Chronic obstructive pulmonary disease with (acute) exacerbati      Chest pain, unspecified      Jan 01, 2019 [place]. [facility]. [facility]      Personal history of other diseases of the respiratory system      Shortness of breath          Care Team  There is not a care team on record at this time.   Collective Portal  This patient has registered at the [facility] Emergency Department   For more information visit: [email or web address]     PLEASE NOTE:     1.   Any care recommendations and other clinical information are provided as guidelines or for historical purposes only, and providers should exercise their own clinical judgment when providing care.    2.   You may only use this information for purposes of treatment, payment or health care operations activities, and subject to the limitations of applicable [employer] Policies.    3.   You should consult directly with the organization that provided a care guideline or other clinical history with any questions about additional information or accuracy or completeness of information provided.    ?  2020 [employer] - [email or web address]

## 2019-07-10 NOTE — Discharge Instructions (Signed)
Chronic Pain Exacerbation    You have been seen for your chronic pain problem.    The Emergency Department/Urgent Care is available for emergencies related to your painful problem. However, you need to see your regular doctor for ongoing care for your pain.    ALL pain medicine refills MUST be done by your primary care doctor or the pain specialist who takes care of your pain.    The Emergency Department/Urgent Care is not the right place for the ongoing care of chronic pain.    If you have not seen a pain specialist, ask the doctor for information. He or she can give you information on who to contact and help refer you to one.    If you feel you are not able to get proper pain treatment, contact your health plan for advice.    YOU SHOULD SEEK MEDICAL ATTENTION IMMEDIATELY, EITHER HERE OR AT THE NEAREST EMERGENCY DEPARTMENT, IF ANY OF THE FOLLOWING OCCURS:   Any major change in your pain pattern.   Symptoms become worse.   You have any other concerns.    If you can't follow up with your doctor, or if at any time you feel you need to be rechecked or seen again, come back here or go to the nearest emergency department.               Arthralgia    You have been diagnosed with Arthralgia.    Arthralgia means pain and stiffness of the joints. People often describe the pain as aching or throbbing. Arthralgia can affect one or more joints. It can be caused by many types of conditions and/or injuries. Often, arthralgia lasts for a long time and people need treatment over months or years. Some causes of arthralgia are:     Infection with a virus.   Many types of infections that are starting to improve. When recovering from infection, sometimes there is joint pain.    Autoimmune diseases (where the body attacks itself). Examples are Lupus or Rheumatoid Arthritis.   Inflammation of the tendons or the fluid-filled sacs (bursa) surrounding your joints.   Low thyroid function.   Depression.     You might need  another exam or more tests to find out why you have arthralgias. At this time, the cause of your symptoms does not seem dangerous. You do not need to stay in the hospital.    We dont believe your condition is dangerous right now. However, you need to be careful. Sometimes a problem that seems small can get serious later. This is why it is very important to come back here or go to the nearest Emergency Department unless you are much improved.    Clues that joint pain is dangerous are:     Hot and swollen joints. This may mean they are infected.   Fever (Temperature higher than 100.43F or 38C), weight loss and feeling very ill can be symptoms of severe infection (sepsis).   Severe pain, weakness or numbness (loss of feeling).    Some things you can try at home are:     Over-the-counter pain medications.   Heating pads and warm baths.   Physical therapy.    Follow the instructions for any medication you get prescribed.     Have a close follow-up with your primary care doctor.    YOU SHOULD SEEK MEDICAL ATTENTION IMMEDIATELY, EITHER HERE OR AT THE NEAREST EMERGENCY DEPARTMENT, IF ANY OF THE FOLLOWING OCCURS:  You have a fever (temperature higher than 100.62F or 38C).   Your pain does not go away or gets worse.   The joint that hurts turns red and/or gets swollen.   You suddenly can't walk.   You don't feel better after treatment or feel you're getting worse.   You get any other symptoms, concerns, or don't get better as expected.    If you can't follow up with your doctor, or if at any time you feel you need to be rechecked or seen again, come back here or go to the nearest emergency department.           The following is a list of physicians who treat patients with chronic pain.  Please note that this information is provided as a courtesy to our Emergency Department patients. The information above should be verified at the time of making the appointment. Please contact each provider to  verify their information availability and insurance participation. The [place] referral line [phone] is another resource you can use to locate providers in the area.      PHYSICIAN Phone Location Medicare Medicaid Self-Pay   Dr. Stark Jock Leslie  No  No  No    Dr. Lacinda Axon [phone] [place] Yes Yes Yes           Drs. Cherrick, [place], & Marcello Moores [phone] Olam Idler  Yes (primary) Yes Yes   Dr. Bruce Donath, and Dr. Kelby Aline  [place] Yes NO -only in [place] Yes   Drs. [address] & Earleen Newport [phone] [facility]  Yes No Yes   Dr. Ardyth Man Budampati [phone] [place] Yes (Primary) No Yes   Drs. Balint [phone] [place] Yes No Yes   Dr. Tressie Stalker [phone] Aundra Dubin  No No Yes     [facility]   [phone]  [place], [place]   (No narcotic pain management)  Medicare, Medicaid, and Self pay accepted.

## 2019-11-24 ENCOUNTER — Emergency Department: Payer: Medicaid Other

## 2019-11-24 DIAGNOSIS — I110 Hypertensive heart disease with heart failure: Principal | ICD-10-CM | POA: Diagnosis present

## 2019-11-24 DIAGNOSIS — F17210 Nicotine dependence, cigarettes, uncomplicated: Secondary | ICD-10-CM | POA: Diagnosis present

## 2019-11-24 DIAGNOSIS — Z79899 Other long term (current) drug therapy: Secondary | ICD-10-CM

## 2019-11-24 DIAGNOSIS — R0789 Other chest pain: Secondary | ICD-10-CM | POA: Diagnosis present

## 2019-11-24 DIAGNOSIS — Z20822 Contact with and (suspected) exposure to covid-19: Secondary | ICD-10-CM | POA: Diagnosis present

## 2019-11-24 DIAGNOSIS — M5440 Lumbago with sciatica, unspecified side: Secondary | ICD-10-CM | POA: Diagnosis present

## 2019-11-24 DIAGNOSIS — M48061 Spinal stenosis, lumbar region without neurogenic claudication: Secondary | ICD-10-CM | POA: Diagnosis present

## 2019-11-24 DIAGNOSIS — I2510 Atherosclerotic heart disease of native coronary artery without angina pectoris: Secondary | ICD-10-CM | POA: Diagnosis present

## 2019-11-24 DIAGNOSIS — I509 Heart failure, unspecified: Secondary | ICD-10-CM

## 2019-11-24 DIAGNOSIS — F418 Other specified anxiety disorders: Secondary | ICD-10-CM | POA: Diagnosis present

## 2019-11-24 DIAGNOSIS — J449 Chronic obstructive pulmonary disease, unspecified: Secondary | ICD-10-CM | POA: Diagnosis present

## 2019-11-24 DIAGNOSIS — E662 Morbid (severe) obesity with alveolar hypoventilation: Secondary | ICD-10-CM | POA: Diagnosis present

## 2019-11-24 DIAGNOSIS — Z7951 Long term (current) use of inhaled steroids: Secondary | ICD-10-CM

## 2019-11-24 DIAGNOSIS — K219 Gastro-esophageal reflux disease without esophagitis: Secondary | ICD-10-CM | POA: Diagnosis present

## 2019-11-24 DIAGNOSIS — I5033 Acute on chronic diastolic (congestive) heart failure: Secondary | ICD-10-CM | POA: Diagnosis present

## 2019-11-24 DIAGNOSIS — G5790 Unspecified mononeuropathy of unspecified lower limb: Secondary | ICD-10-CM | POA: Diagnosis present

## 2019-11-24 DIAGNOSIS — I252 Old myocardial infarction: Secondary | ICD-10-CM

## 2019-11-24 DIAGNOSIS — Z6841 Body Mass Index (BMI) 40.0 and over, adult: Secondary | ICD-10-CM

## 2019-11-24 DIAGNOSIS — I959 Hypotension, unspecified: Secondary | ICD-10-CM | POA: Diagnosis not present

## 2019-11-24 NOTE — EDIE (Signed)
COLLECTIVE?NOTIFICATION?11/24/2019 18:27?[name], [name]?MRN: [id number]    Criteria Met      5 ED Visits in 12 Months    COVID-19 Positive Lab Results    Security and Safety  No recent Security Events currently on file    ED Care Guidelines  There are currently no ED Care Guidelines for this patient. Please check your facility's medical records system.    Flags      Positive COVID-19 Lab Result - [facility] - A specimen collected from this patient was positive for COVID-19 / Attributed By: [place] [facility] / Attributed On: 11/19/2020       Prescription Monitoring Program  000??- Narcotic Use Score  000??- Sedative Use Score  000??- Stimulant Use Score  000??- Overdose Risk Score  - All Scores range from 000-999 with 75% of the population scoring < 200 and on 1% scoring above 650  - The last digit of the narcotic, sedative, and stimulant score indicates the number of active prescriptions of that type  - Higher Use scores correlate with increased prescribers, pharmacies, mg equiv, and overlapping prescriptions  - Higher Overdose Risk Scores correlate with increased risk of unintentional overdose death   Concerning or unexpectedly high scores should prompt a review of the PMP record; this does not constitute checking PMP for prescribing purposes.      E.D. Visit Count (12 mo.)  Facility Visits   [facility] 6   Total 6   Note: Visits indicate total known visits.      Recent Emergency Department Visit Summary  Date Facility [facility] Type Diagnoses or Chief Complaint   Nov 24, 2019 [place]. [facility]. [facility]      Leg Swelling      Jul 10, 2019 [place]. Alexa. [facility]      knee pain      Pain in right knee      Other chronic pain      Pain in left knee      Morbid (severe) obesity due to excess calories      Jul 01, 2019 [place]. [facility]. [facility]      COVID test      Chest tightness, cough COVID test      Chest congestion      Acute bronchitis,  unspecified      Jun 04, 2019 [place]. [facility]. [facility]      Vag bleeding ,abd cramping      Hip Pain      Vaginal Bleeding      Shortness of breath      Urinary tract infection, site not specified      Pain in right hip      Other general symptoms and signs      Abnormal uterine and vaginal bleeding, unspecified      Jan 31, 2019 [place]. Alexa. [facility]      sob, cough, chest pain      Cough      Shortness of Breath      Chest pain, unspecified      Chronic obstructive pulmonary disease with (acute) exacerbation      Pneumonia, unspecified organism      Jan 01, 2019 [place]. [facility]      SOB      Medic-SOB      Shortness of Breath      Personal  history of other diseases of the respiratory system      Shortness of breath          Recent Inpatient Visit Summary  Date [place] Type Diagnoses or Chief Complaint   Jan 31, 2019 [place]. [facility]. [facility]      Pneumonia, unspecified organism      Chronic obstructive pulmonary disease with (acute) exacerbation      Chest pain, unspecified      Jan 01, 2019 [place] [facility]. [facility]      Personal history of other diseases of the respiratory system      Shortness of breath          Care Team  There is not a care team on record at this time.   Collective Portal  This patient has registered at the [facility] Emergency Department   For more information visit: [email or web address]     PLEASE NOTE:     1.   Any care recommendations and other clinical information are provided as guidelines or for historical purposes only, and providers should exercise their own clinical judgment when providing care.    2.   You may only use this information for purposes of treatment, payment or health care operations activities, and subject to the limitations of applicable Collective Policies.    3.   You  should consult directly with the organization that provided a care guideline or other clinical history with any questions about additional information or accuracy or completeness of information provided.    ? 2021 [employer] - [email or web address]

## 2019-11-24 NOTE — ED Triage Notes (Signed)
EMERGENCY DEPARTMENT PIT NOTE    Patient Name: [name]    [name] with h/o COPD, CHF, depression, arthritis in both knees has had a rapid medical screening evaluation by myself for the chief complaint of severe BLE edema x 3 days and worsening SOB/DOE. Diagnosed with CHF 3 months ago but never seen by cardiology and not currently on any diuretics. Pain is achy and severe. Pt also notes visiting family over holidays.Denies any known COVID19 contacts but is requesting testing.  Cough at baseline.    Home meds: percocet, zoloft, inhalers for copd, atomoxetine, risperidone, oxybutynin, indomethacin, gabapentin .      Vitals: BP 108/78    Pulse 72    Temp 98.2 F (36.8 C) (Oral)    Resp 20    Ht _0  (1.727 m)    Wt 149.7 kg    SpO2 97%    BMI 50.18 kg/m   Pertinent brief exam: BLE edema, diminished BS and occasional wheezing, speaking full sentences  Prelim orders: labs, ekg, cxr    Patient advised to remain in the ED until further evaluation can be performed. Patient instructed to notify staff of any changes in condition while waiting.    I am not the sole provider and this assessment is only an initial evaluation prior to full evaluation to expedite care.

## 2019-11-25 ENCOUNTER — Inpatient Hospital Stay
Admission: EM | Admit: 2019-11-25 | Discharge: 2019-11-29 | DRG: 194 | Disposition: A | Discharge status: Home / Self Care | Payer: Medicaid Other | Attending: Internal Medicine | Admitting: Internal Medicine

## 2019-11-25 ENCOUNTER — Encounter: Payer: Self-pay | Admitting: Internal Medicine

## 2019-11-25 DIAGNOSIS — R0789 Other chest pain: Secondary | ICD-10-CM | POA: Diagnosis present

## 2019-11-25 DIAGNOSIS — R0602 Shortness of breath: Secondary | ICD-10-CM

## 2019-11-25 LAB — CBC AND DIFFERENTIAL
Absolute NRBC: 0 10*3/uL (ref 0.00–0.00)
Basophils Absolute Automated: 0.12 10*3/uL — ABNORMAL HIGH (ref 0.00–0.08)
Basophils Automated: 1.1 %
Eosinophils Absolute Automated: 0.72 10*3/uL — ABNORMAL HIGH (ref 0.00–0.44)
Eosinophils Automated: 6.6 %
Hematocrit: 41.2 % (ref 34.7–43.7)
Hgb: 13.6 g/dL (ref 11.4–14.8)
Immature Granulocytes Absolute: 0.05 10*3/uL (ref 0.00–0.07)
Immature Granulocytes: 0.5 %
Lymphocytes Absolute Automated: 3.6 10*3/uL — ABNORMAL HIGH (ref 0.42–3.22)
Lymphocytes Automated: 33 %
MCH: 29 pg (ref 25.1–33.5)
MCHC: 33 g/dL (ref 31.5–35.8)
MCV: 87.8 fL (ref 78.0–96.0)
MPV: 10.7 fL (ref 8.9–12.5)
Monocytes Absolute Automated: 0.77 10*3/uL (ref 0.21–0.85)
Monocytes: 7.1 %
Neutrophils Absolute: 5.65 10*3/uL (ref 1.10–6.33)
Neutrophils: 51.7 %
Nucleated RBC: 0 /100 WBC (ref 0.0–0.0)
Platelets: 269 10*3/uL (ref 142–346)
RBC: 4.69 10*6/uL (ref 3.90–5.10)
RDW: 14 % (ref 11–15)
WBC: 10.91 10*3/uL — ABNORMAL HIGH (ref 3.10–9.50)

## 2019-11-25 LAB — URINALYSIS REFLEX TO MICROSCOPIC EXAM - REFLEX TO CULTURE
Bilirubin, UA: NEGATIVE
Blood, UA: NEGATIVE
Glucose, UA: NEGATIVE
Ketones UA: NEGATIVE
Leukocyte Esterase, UA: NEGATIVE
Nitrite, UA: NEGATIVE
Protein, UR: NEGATIVE
Specific Gravity UA: 1.019 (ref 1.001–1.035)
Urine pH: 6 (ref 5.0–8.0)
Urobilinogen, UA: NEGATIVE mg/dL (ref 0.2–2.0)

## 2019-11-25 LAB — HEMOLYSIS INDEX: Hemolysis Index: 22 — ABNORMAL HIGH (ref 0–18)

## 2019-11-25 LAB — COMPREHENSIVE METABOLIC PANEL
ALT: 9 U/L (ref 0–55)
AST (SGOT): 10 U/L (ref 5–34)
Albumin/Globulin Ratio: 1.3 (ref 0.9–2.2)
Albumin: 3.5 g/dL (ref 3.5–5.0)
Alkaline Phosphatase: 92 U/L (ref 37–106)
Anion Gap: 11 (ref 5.0–15.0)
BUN: 18 mg/dL (ref 7.0–19.0)
Bilirubin, Total: 0.3 mg/dL (ref 0.2–1.2)
CO2: 21 mEq/L — ABNORMAL LOW (ref 22–29)
Calcium: 9.9 mg/dL (ref 8.5–10.5)
Chloride: 109 mEq/L (ref 100–111)
Creatinine: 0.7 mg/dL (ref 0.6–1.0)
Globulin: 2.6 g/dL (ref 2.0–3.6)
Glucose: 107 mg/dL — ABNORMAL HIGH (ref 70–100)
Potassium: 4.3 mEq/L (ref 3.5–5.1)
Protein, Total: 6.1 g/dL (ref 6.0–8.3)
Sodium: 141 mEq/L (ref 136–145)

## 2019-11-25 LAB — ECG 12-LEAD
Atrial Rate: 63 {beats}/min
P Axis: 37 degrees
P-R Interval: 152 ms
Q-T Interval: 406 ms
QRS Duration: 90 ms
QTC Calculation (Bezet): 415 ms
R Axis: -1 degrees
T Axis: 26 degrees
Ventricular Rate: 63 {beats}/min

## 2019-11-25 LAB — COVID-19 (SARS-COV-2): SARS CoV 2 Overall Result: NEGATIVE

## 2019-11-25 LAB — HCG QUANTITATIVE: hCG, Quant.: <1.2

## 2019-11-25 LAB — GFR: EGFR: >60

## 2019-11-25 LAB — TROPONIN I: Troponin I: <0.01 ng/mL (ref 0.00–0.05)

## 2019-11-25 LAB — B-TYPE NATRIURETIC PEPTIDE: B-Natriuretic Peptide: 24 pg/mL (ref 0–100)

## 2019-11-25 MED ORDER — SERTRALINE HCL 50 MG PO TABS
100.0000 mg | ORAL_TABLET | Freq: Two times a day (BID) | ORAL | Status: DC
Start: 2019-11-25 — End: 2019-11-29
  Administered 2019-11-25 – 2019-11-29 (×8): 100 mg via ORAL
  Filled 2019-11-25 (×8): qty 2

## 2019-11-25 MED ORDER — HYDROMORPHONE HCL 1 MG/ML IJ SOLN
1.00 mg | INTRAMUSCULAR | Status: DC | PRN
Start: 2019-11-25 — End: 2019-11-29
  Administered 2019-11-26 – 2019-11-29 (×12): 1 mg via INTRAVENOUS
  Filled 2019-11-25 (×12): qty 1

## 2019-11-25 MED ORDER — GABAPENTIN 400 MG PO CAPS
800.00 mg | ORAL_CAPSULE | Freq: Three times a day (TID) | ORAL | Status: DC
Start: 2019-11-25 — End: 2019-11-29
  Administered 2019-11-25 – 2019-11-29 (×13): 800 mg via ORAL
  Filled 2019-11-25 (×14): qty 2

## 2019-11-25 MED ORDER — RISPERIDONE 1 MG PO TABS
3.0000 mg | ORAL_TABLET | Freq: Two times a day (BID) | ORAL | Status: DC
Start: 2019-11-25 — End: 2019-11-29
  Administered 2019-11-25 – 2019-11-29 (×8): 3 mg via ORAL
  Filled 2019-11-25 (×6): qty 3
  Filled 2019-11-25: qty 1
  Filled 2019-11-25 (×2): qty 3
  Filled 2019-11-25: qty 1

## 2019-11-25 MED ORDER — NALOXONE HCL 0.4 MG/ML IJ SOLN (WRAP)
0.20 mg | INTRAMUSCULAR | Status: DC | PRN
Start: 2019-11-25 — End: 2019-11-29

## 2019-11-25 MED ORDER — OXYBUTYNIN CHLORIDE 5 MG PO TABS
5.0000 mg | ORAL_TABLET | Freq: Three times a day (TID) | ORAL | Status: DC
Start: 2019-11-25 — End: 2019-11-25
  Administered 2019-11-25: 17:00:00 5 mg via ORAL
  Filled 2019-11-25: qty 1

## 2019-11-25 MED ORDER — ACETAMINOPHEN 500 MG PO TABS
1000.0000 mg | ORAL_TABLET | Freq: Three times a day (TID) | ORAL | Status: DC
Start: 2019-11-25 — End: 2019-11-29
  Administered 2019-11-25 – 2019-11-29 (×13): 1000 mg via ORAL
  Filled 2019-11-25 (×13): qty 2

## 2019-11-25 MED ORDER — OXYCODONE HCL 5 MG PO TABS
5.0000 mg | ORAL_TABLET | Freq: Four times a day (QID) | ORAL | Status: DC | PRN
Start: 2019-11-25 — End: 2019-11-25

## 2019-11-25 MED ORDER — FUROSEMIDE 10 MG/ML IJ SOLN
40.00 mg | Freq: Two times a day (BID) | INTRAMUSCULAR | Status: DC
Start: 2019-11-25 — End: 2019-11-27
  Administered 2019-11-25 – 2019-11-27 (×4): 40 mg via INTRAVENOUS
  Filled 2019-11-25 (×4): qty 4

## 2019-11-25 MED ORDER — FLUTICASONE FUROATE-VILANTEROL 100-25 MCG/INH IN AEPB
1.00 | INHALATION_SPRAY | Freq: Every morning | RESPIRATORY_TRACT | Status: DC
Start: 2019-11-26 — End: 2019-11-29
  Administered 2019-11-26 – 2019-11-29 (×4): 1 via RESPIRATORY_TRACT
  Filled 2019-11-25: qty 14

## 2019-11-25 MED ORDER — ENOXAPARIN SODIUM 40 MG/0.4ML SC SOLN
40.00 mg | Freq: Every day | SUBCUTANEOUS | Status: DC
Start: 2019-11-25 — End: 2019-11-29
  Administered 2019-11-25 – 2019-11-29 (×5): 40 mg via SUBCUTANEOUS
  Filled 2019-11-25 (×5): qty 0.4

## 2019-11-25 MED ORDER — OXYCODONE-ACETAMINOPHEN 5-325 MG PO TABS
1.0000 | ORAL_TABLET | Freq: Once | ORAL | Status: AC
Start: 2019-11-25 — End: 2019-11-25
  Administered 2019-11-25: 11:00:00 1 via ORAL
  Filled 2019-11-25: qty 1

## 2019-11-25 MED ORDER — OXYCODONE HCL 5 MG PO TABS
10.0000 mg | ORAL_TABLET | Freq: Four times a day (QID) | ORAL | Status: DC | PRN
Start: 2019-11-25 — End: 2019-11-26
  Administered 2019-11-25 – 2019-11-26 (×3): 10 mg via ORAL
  Filled 2019-11-25 (×3): qty 2

## 2019-11-25 MED ORDER — ALBUTEROL-IPRATROPIUM 2.5-0.5 (3) MG/3ML IN SOLN
3.00 mL | Freq: Four times a day (QID) | RESPIRATORY_TRACT | Status: DC
Start: 2019-11-25 — End: 2019-11-29
  Administered 2019-11-25 – 2019-11-29 (×16): 3 mL via RESPIRATORY_TRACT
  Filled 2019-11-25 (×17): qty 3

## 2019-11-25 MED ORDER — ASPIRIN 81 MG PO CHEW
81.00 mg | CHEWABLE_TABLET | Freq: Once | ORAL | Status: AC
Start: 2019-11-25 — End: 2019-11-25
  Administered 2019-11-25: 07:00:00 81 mg via ORAL
  Filled 2019-11-25: qty 1

## 2019-11-25 MED ORDER — OXYBUTYNIN CHLORIDE 5 MG PO TABS
10.0000 mg | ORAL_TABLET | Freq: Three times a day (TID) | ORAL | Status: DC
Start: 2019-11-25 — End: 2019-11-29
  Administered 2019-11-25 – 2019-11-29 (×12): 10 mg via ORAL
  Filled 2019-11-25 (×13): qty 2

## 2019-11-25 NOTE — UM Notes (Signed)
UTILIZATION REVIEW CONTACT: Name:  Milas Gain MSN RN CCM   Utilization Review Case Manager    [facility]  Address:  [address] , [place] ,[place] [address]  NPI:   [id number]  Tax ID:  [id number]  Phone: [phone]  Fax: [phone]  Email: [email or web address]    Auth Number : [id number]    ER ADMIT DATE AND TIME: 11/25/2019  6:11 AM  OBS admit:  11/25/19 1103      PATIENT NAME: [name],[name]  DOB: [birth year]      ADMISSION REVIEW   History of present illness: Pt is a 49 y.o. female arrived at [facility] on 11/25/2019 at 0611.    Complaints:    Chief Complaint   Patient presents with    Leg Swelling     The pt has a h/o COPD, CHF, depression, arthritis in both knees p/w a chief complaint of severe BLE edema x 3 days and worsening SOB/DOE. Diagnosed with CHF 3 months ago but never seen by cardiology and not currently on any diuretics. Pain is achy and severe. Pt also notes visiting family over holidays.Denies any known COVID19 contacts but is requesting testing.  Cough at baseline.      PMH:  has a past medical history of Anxiety, Chronic obstructive pulmonary disease, Myocardial infarction, and Panic attacks.  PSH:  has a past surgical history that includes Cholecystectomy; Appendectomy; and ARTHROSCOPIC KNEE, ACL RECONSTRUCTION.      DIAGNOSIS:     ICD-10-CM    1. Chest tightness  R07.89    2. Shortness of breath  R06.02          VS:   Vitals:    11/25/19 0900   BP: 97/52   Pulse: 72   Resp: 22   Temp:    SpO2: 94%          LABS   WBC 10.91; GLUCOSE 107; CO2 21;    Radiologic Studies -   Xr Chest  Ap Portable    Result Date: 11/24/2019  No acute process. Delaney Meigs, MD  11/24/2019 11:18 PM         ED meds:      Date/Time Order Dose Route Action    11/25/2019 0650 aspirin chewable tablet 81 mg 81 mg Oral Given    11/25/2019 1025 furosemide (LASIX) injection 40 mg 40 mg Intravenous Given    11/25/2019 1029 enoxaparin (LOVENOX) syringe 40 mg 40 mg Subcutaneous Given    11/25/2019 1030  oxyCODONE-acetaminophen (PERCOCET) 5-325 MG per tablet 1 tablet 1 tablet Oral Given         MD NOTES:  PENDING    I/S: CARDIAC DIET-SODIUM RESTRICTION; TELEMETRY; INCENTIVE SPIROMETRY; SCDs; Is &Os; PULSE OXIMETRY;PT/OT; FALL PRECAUTIONS     Current Medications:   Scheduled Meds:  Current Facility-Administered Medications   Medication Dose Route Frequency    acetaminophen  1,000 mg Oral TID    enoxaparin  40 mg Subcutaneous Daily    furosemide  40 mg Intravenous BID     Continuous Infusions:  PRN Meds:.HYDROmorphone, naloxone, oxyCODONE

## 2019-11-25 NOTE — OT Eval Note (Signed)
[facility]  Occupational Therapy Evaluation and Treatment    Patient: [name]  MRN#: [id number]   Unit: [place] [facility] DEPARTMENT  Bed: BL23/BL23    Time of Evaluation and Treatment:  Time Calculation  OT Received On: 11/25/19  Start Time: 1406  Stop Time: 1428  Time Calculation (min): 22 min    Chart Review and Collaboration with Care Team: 10 minutes, not included in above time.    Evaluation: 7 minutes  Treatment:  15 minutes    OT Visit Number: 1    Consult received for [name] for OT Evaluation and Treatment.  Patients medical condition is appropriate for Occupational therapy intervention at this time.    Activity Orders:  OT eval and treat    Precautions and Contraindications:  Precautions  Weight Bearing Status: no restrictions  Aspiration Precautions: see SLP recommendations  Aspiration Precautions: see MD/RN orders  Other Precautions: Fall; Increased edema in LE; No WB precuations currently    Personal Protective Equipment (PPE)  gloves, procedure mask and eye shield/covering    Medical Diagnosis:  Chest tightness [R07.89]    History of Present Illness:  [name] is a 49 y.o. female admitted on 11/25/2019 with with h/o COPD, CHF, depression, arthritis in both knees has had a rapid medical screening evaluation by myself for the chief complaint of severe BLE edema x 3 days and worsening SOB/DOE. Diagnosed with CHF 3 months ago but never seen by cardiology and not currently on any diuretics. Pain is achy and severe. Pt also notes visiting family over holidays.Denies any known COVID19 contacts but is requesting testing.  Cough at baseline.      Patient Active Problem List   Diagnosis    Multifocal pneumonia    COPD with acute exacerbation    CAD (coronary artery disease)    OSA (obstructive sleep apnea)    Anxiety    Overactive bladder    PNA (pneumonia)    Chest tightness        Past Medical/Surgical History:  Past Medical History:   Diagnosis Date     Anxiety     Chronic obstructive pulmonary disease     Myocardial infarction     Panic attacks      Past Surgical History:   Procedure Laterality Date    APPENDECTOMY      ARTHROSCOPIC KNEE, ACL RECONSTRUCTION      R. knee     CHOLECYSTECTOMY          X-Rays/Tests/Labs  Lab Results   Component Value Date/Time    HGB 13.6 11/25/2019 02:44 AM    HCT 41.2 11/25/2019 02:44 AM    K 4.3 11/25/2019 02:44 AM    NA 141 11/25/2019 02:44 AM    INR 0.9 01/31/2019 10:42 PM    TROPI <0.01 11/25/2019 02:44 AM    TROPI <0.01 06/04/2019 11:42 PM    TROPI <0.01 01/31/2019 10:42 PM    TROPI <0.01 01/02/2019 08:39 AM       All imaging reviewed. 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 lives in a 3rd floor apartment with her fiance and mother; There are only stairs to enter, no elevator; Pt. has a tub/shower with a shower seat she used prior to admission      Subjective:  Subjective:  "My legs and back are hurting so bad"    Patient is agreeable to participation in the therapy session.   Patient Goal: Return home w/out leg pain  Pain Assessment  Pain Assessment: Numeric Scale (0-10)  Pain Score: 6-moderate pain  Pain Location: Back, Leg, Hip  Pain Descriptors: Cramping  Pain Intervention(s): Medication (See eMAR), Other (Comment)(Notified RN)      Objective:      Observation of Patient/Vital Signs:BP 97/52    Pulse 72    Temp 98.1 F (36.7 C)    Resp 22    Ht 1.727 m (5' 8")    Wt 149.7 kg (330 lb)    SpO2 94%    BMI 50.18 kg/m     Patient received in bed with telemetry  and external suction system catheter  in place.    Cognitive Status and Neuro Exam:  Cognition/Neuro Status  Arousal/Alertness: Appropriate responses to stimuli  Attention Span: Appears intact  Orientation Level: Oriented X4  Memory: Appears intact  Following Commands: independent  Safety Awareness: minimal verbal instruction  Insights: Educated in safety awareness  Problem Solving: Assistance required to implement solutions  Comments: Pt. required minimal verbal cues for safety; Pt. leaned forward to don socks w/ decreased balance, pt. required assistance to don/doff socks to maintain safety; Pt. had urinary incontinence during session at EOB, minimal verbal cues to maintain safety during perineal hygiene and pad replacement     Musculoskeletal Examination  Gross ROM  Right Upper Extremity ROM: within functional limits  Left Upper Extremity ROM: within functional limits  Gross Strength  Right Upper Extremity Strength: (pt. requested I not test due to IV, hand WFL)  Left Upper Extremity Strength: within functional limits       Sensory/Oculomotor Examination  Sensory  Auditory: intact  Tactile - Light Touch: intact, other (comment)(Sensation intact B UE and BLE)  Visual Acuity: intact         Activities of Daily Living  Self-care and Home Management  LB Dressing: Maximal Assist, sitting, edge of bed, Don/doff R sock, Don/doff L  sock, Supervision/safety, Increased time to complete  Toileting: Maximal Assist, verbal prompting, supervison/safety, perineal hygiene, other (Comment)(Lying supine; Urinary incontinence; Able to roll to side)  Functional Transfers: other (Comment)(UTA)    Functional Mobility:  Mobility and Transfers  Rolling: Contact Guard Assist  Supine to Sit: Contact Guard Assist  Sit to Supine: Contact Guard Assist  Sit to Stand: Unable to assess (Comment)  Functional Mobility/Ambulation: Unable to assess (Comment)     Balance  Balance  Static Sitting Balance: Contact Guard Assist  Dyanamic Sitting Balance: Minimal Assist  Static Standing Balance: (UTA)    Participation and Activity Tolerance  Participation and Endurance  Participation Effort: good  Endurance: Tolerates 10 - 20 min exercise with multiple rests    Educated the patient to role of occupational therapy, plan of care, goals of therapy and safety with mobility and ADLs.    Patient left in bed with telemetry and external suction in place and call bell and all personal items/needs within reach. RN notified of session outcome.       Assessment:  [name] is a 49 y.o. female admitted 11/25/2019. Patient would benefit from continued skilled OT to address deficits listed below and increase functional independence.      A Expanded chart review was completed including review of labs and review of vitals.  There are many comorbidities or other factors that affect plan of care and require modification of task including:CHF, COPD, psych history, MI, arthritis assistive device at baseline, challenging home environment .  Pt demonstrates performance deficits with grooming, bathing, dressing, toileting, functional mobility and functional transfers.  Pt's ability to complete ADLs and functional transfers is impaired due to the following deficits:  activity tolerance/endurance, balance, pain and LE Edema.     Assessment: balance deficits;decreased independence with  ADLs;decreased safety awareness;decreased independence with IADLs;decreased endurance/activity tolerance    Complexity Chart  Review Performance  Deficits Clinical Decision  Making Hx/Co-  morbidities Assistance needed   Moderate Expanded 3-5 Several Options 1-2 Min/Mod assist (not at baseline)       Treatment:  - Pt. Very fatigued and in pain upon entering room, agreeable to therapy session.   - Pt. Requires A w/ IADLs and Min A donning LE clothing at baseline.   - Pt. States 6/10 pain in LE, severe edema w/ slight redness. Pt. States LE usually not painful but w/edema has severe pain.   - Pt. CGA for supine to sit EOB. Once sitting EOB pt. Attempt to don/doff socks. Pt. Leaned forward w/ balance deficit. Pt. Max A to don/doff socks. Pt. Became highly fatigued after donning socks.  - Pt. Experienced urinary incontinence seated EOB, min verbal cues for safety during perineal hygiene, sit>supine, and linen changes.       Rehabilitation Potential: Prognosis: Good    Plan:  OT Frequency Recommended: 2-3x/wk   Treatment Interventions: ADL retraining, Functional transfer training, Endurance training, Patient/Family training, Equipment eval/education, Compensatory technique education             Risks/benefits/POC discussed    AM-PAC:yes  OT Daily Activity Raw Score: 16  CMS 0-100% Score: 53.32%             Goals:  Time For Goal Achievement: by time of discharge  ADL Goals  Patient will dress lower body: Minimal Assist, by time of discharge, Not met  Pt will complete bathing: Minimal Assist, by time of discharge, Not met  Patient will toilet: Minimal Assist, by time of discharge, Not met  Mobility and Transfer Goals  Pt will perform functional transfers: Minimal Assist, by time of discharge, Not met  Pt will perform shower transfer: Minimal Assist, by time of discharge, Not met                             DME Recommended for Discharge: Grab bars  Discharge Recommendation: SNF(Pending progress: Home w/  HHOT/PT)      Nell Range, MSOT, OTR/L  11/25/2019 2:58 PM

## 2019-11-25 NOTE — Progress Notes (Signed)
Pt is a 49  Yrs old female admitted with diagnosis of chest tightness. She is alert and oriented. Independent care. Oriented to unit with call light and phone within reached.Oxycodone 44m PRN given for back and leg pain. On neb treatment PRN. Fall safety in place Will continue to monitor.

## 2019-11-25 NOTE — PT Eval Note (Addendum)
[facility]  Physical Therapy Evaluation and Treatment    Patient: [name]  MRN#: [id number]  Unit: [address] [place]  Bed: A2319/A2319.A    Time of Evaluation and Treatment:  Time Calculation  PT Received On: 11/25/19  Start Time: 1546  Stop Time: 1606  Time Calculation (min): 20 min    Evaluation Time: 9 minutes  Treatment Time: 11 minutes    Chart Review and Collaboration with Care Team: 6 minutes, not included in above time    PT Visit Number: 1    Consult received for [name] for PT Evaluation and Treatment.  Patients medical condition is appropriate for Physical therapy intervention at this time.    Activity Orders:  PT eval and treat and progressive mobility protocol    Precautions and Contraindications:  Precautions  Weight Bearing Status: no restrictions  Aspiration Precautions: see SLP recommendations  Aspiration Precautions: see MD/RN orders  Other Precautions: Falls, Obese     Personal Protective Equipment (PPE)  gloves, procedure mask, shoe covers, goggles and pt wore procedure mask    Medical Diagnosis:  Shortness of breath [R06.02]  Chest tightness [R07.89]    History of Present Illness:  [name] is a 49 y.o. female admitted on 11/25/2019 with h/o COPD, CHF, depression, arthritis in both kneeswith chief complaint ofsevere BLE edema x 3 days and worsening SOB/DOE. Diagnosed with CHF 3 months ago but never seen by cardiology and not currently on any diuretics. Pain is achy and severe. Pt also notes visiting family over holidays.Denies any known COVID19 contacts but is requesting testing. Cough at baseline.        Patient Active Problem List   Diagnosis    Multifocal pneumonia    COPD with acute exacerbation    CAD (coronary artery disease)    OSA (obstructive sleep apnea)    Anxiety    Overactive bladder    PNA (pneumonia)    Chest tightness       Past Medical/Surgical History:  Past Medical History:   Diagnosis Date    Anxiety     Chronic  obstructive pulmonary disease     Myocardial infarction     Panic attacks      Past Surgical History:   Procedure Laterality Date    APPENDECTOMY      ARTHROSCOPIC KNEE, ACL RECONSTRUCTION      R. knee     CHOLECYSTECTOMY         X-Rays/Tests/Labs:  Lab Results   Component Value Date/Time    HGB 13.6 11/25/2019 02:44 AM    HCT 41.2 11/25/2019 02:44 AM    K 4.3 11/25/2019 02:44 AM    NA 141 11/25/2019 02:44 AM    INR 0.9 01/31/2019 10:42 PM    TROPI <0.01 11/25/2019 02:44 AM    TROPI <0.01 06/04/2019 11:42 PM    TROPI <0.01 01/31/2019 10:42 PM    TROPI <0.01 01/02/2019 08:39 AM       All imaging reviewed, please see chart for details.    Social History:  Prior Level of Function  Prior level of function: Independent with ADLs, Ambulates with assistive device  Assistive Device: Four wheel walker(uses rollator )  Baseline Activity Level: Community ambulation  Driving: does not drive  Cooking: No  Employment: Disabled  DME Currently at Home: ADL- Civil engineer, contracting, Environmental consultant, [place] Living Arrangements  Living Arrangements: Spouse/significant other, Other (Comment)(+ Mother)  Type of Home: [place]  Layout: One level, Stairs to enter with rails (add number in comment)(3 FOS to enter apartment, no elevator )  Bathroom Shower/Tub: Research scientist (physical sciences): Public house manager: Accessible, Accessible via walker  DME Currently at Home: ADL- Civil engineer, contracting, Environmental consultant, [place] - Notes / Comments: Pt lives in a 3rd floor apartment with her fianc and mother who are able to assist as needed. Pt denies hx of falls.       Subjective:  Patient is agreeable to participation in the therapy session. Nursing clears patient for therapy.     Pain Assessment  Pain Assessment: Numeric Scale (0-10)  Pain Score: 6-moderate pain  POSS Score: Awake and Alert  Pain Location: Back  Pain Orientation: Lower  Pain Descriptors: Aching;Discomfort  Pain Frequency:  Constant/continuous;Increases with movement  Effect of Pain on Daily Activities: mild  Pain Intervention(s): Relaxation technique;Repositioned  Multiple Pain Sites: No      Objective:  Observation of Patient/Vital Signs:    Vitals Check#1 (supine at rest) BP:146/70  HR:65  O2:95%         Patient received in bed with telemetry  in place.    Inspection/Posture: Pt received supine in bed     Cognitive Status and Neuro Exam:  Cognition/Neuro Status  Arousal/Alertness: Appropriate responses to stimuli  Attention Span: Appears intact  Orientation Level: Oriented X4  Memory: Appears intact  Following Commands: Follows all commands and directions without difficulty  Safety Awareness: minimal verbal instruction  Insights: Fully aware of deficits;Educated in safety awareness  Problem Solving: supervision  Behavior: attentive;calm;cooperative  Motor Planning: intact  Coordination: intact    Musculoskeletal Examination  Gross ROM  Neck/Trunk ROM: within functional limits  Right Upper Extremity ROM: within functional limits  Left Upper Extremity ROM: within functional limits  Right Lower Extremity ROM: within functional limits  Left Lower Extremity ROM: within functional limits  Gross Strength  Right Upper Extremity Strength: 3+/5  Left Upper Extremity Strength: 3+/5  Right Lower Extremity Strength: 4-/5  Left Lower Extremity Strength: 4-/5       Functional Mobility:  Functional Mobility  Supine to Sit: Stand by Assist  Scooting to HOB: Supervision  Scooting to EOB: Supervision  Sit to Supine: Stand by Assist  Sit to Stand: Supervision  Stand to Sit: Supervision     Locomotion  Ambulation: Supervision;with front-wheeled walker(38f)  Pattern: Wide BOS;Step through;decreased step length;decreased cadence     Balance  Balance  Sitting - Static: Good  Sitting - Dynamic: Good  Standing - Static: Good(w/ RW)  Standing - Dynamic: Fair(w/ RW)    Participation and Activity Tolerance  Participation and Endurance  Participation Effort:  fair  Endurance: Tolerates < 10 min exercise, no significant change in vital signs  [place] Dyspnea Scale: 0 Dyspnea    Educated the patient to role of physical therapy, plan of care, goals of therapy and safety with mobility and ADLs, energy conservation techniques, pursed lip breathing, home safety.    Patient left in bed with call bell and all personal items/needs within reach. RN notified of session outcome.      Assessment:  Madeline DEMBECKis a 49y.o. female admitted 11/25/2019. Pt would benefit from Physical Therapy to address deficits and increase functional independence. There are few comorbidities or other factors that affect plan of care and require modification of task including:CHF, COPD, obese, personal factors including:, assistive device at baseline, challenging home environment .  Pt's functional mobility is impacted by: activity tolerance/endurance, balance, gait, pain and strength.  Standardized tests and exams incorporated into evaluation include AMPAC mobility, balance, ROM  and Strength.   Pt demonstrates an evolving clinical presentation due to change in presentation.      Complexity Level Hx and Co-  morbidites Examination Clinical Decision Making Clinical Presentation   Moderate   1-2 factors 3 or more   Several options Evolving, plan may alter       Treatment: Performed ambulation as noted above with no LOB noted however pt was limited by increased fatigue and LBP, requiring one brief standing rest break.  She was  provided education on breathing techniques and pacing to maximize her overall activity tolerance. She also required increased verbal facilitation for upright posture and gait mechanics to maximize safety.  All OOB activities and ambulation performed with use of gait belt for increased pt and therapist safety. Vitals were assessed throughout session and remained WNL.  Reviewed PT plan of care and D/C recommendation. Reinforced  use of nursing assistance for safety and  use of call bell. Pt verbalized understanding and agreement with plan.  Addressed all pt questions and concerns.        Plan:  Treatment/Interventions: Continued evaluation, Compensatory technique education, Bed mobility, Equipment eval/education, Patient/family training, Endurance training, LE strengthening/ROM, Functional transfer training, Neuromuscular re-education, Stair training, Gait training, Exercise  PT Frequency: 1-2x/wk  Risks/Benefits/POC Discussed with Pt/Family: With patient    PMP Activity: Step 7 - Walks out of Room  Distance Walked (ft) (Step 6,7): 60 Feet      Goals:  Goals  Goal Formulation: With patient  Time for Goal Acheivement: By time of discharge  Goals: Select goal  Pt Will Go Supine To Sit: modified independent, to maximize functional mobility and independence, Partly met  Pt Will Perform Sit To Supine: modified independent, to maximize functional mobility and independence, Partly met  Pt Will Perform Sit to Stand: modified independent, to maximize functional mobility and independence, Partly met(using RW)  Pt Will Transfer Bed/Chair: with rolling walker, modified independent, to maximize functional mobility and independence  Pt Will Ambulate: 101-150 feet, with rolling walker, modified independent, to maximize functional mobility and independence  Pt Will Go Up / Down Stairs: 1 flight, modified independent, With rail, With SPC, to maximize functional mobility and independence    AM-PAC:yes        PT Basic Mobility Raw Score: 18  CMS 0-100% Score: 46.58%                DME Recommended for Discharge: Front wheel walker(Needs Bariatric Walker)  Discharge Recommendation: Home with supervision, Home with home health PT    Lenord Fellers, [place], DPT  Physical Therapist  Physical [place]  [phone]  Mon-Thur 7-5:30pm  11/25/2019 4:26 PM

## 2019-11-25 NOTE — Plan of Care (Signed)
Discharge Recommendation: SNF(Pending progress: Home w/ HHOT/PT)   DME Recommended for Discharge: Grab bars    OT Frequency Recommended: 2-3x/wk     Next Visit Recommendation: OT - Next Visit Recommendation: 11/27/19       Is PT evaluation indicated at this time? Yes, a PT evaluation is indicated at this time.          (Please See Therapy Evaluation for device and assistance level needed)    Treatment Interventions: ADL retraining, Functional transfer training, Endurance training, Patient/Family training, Equipment eval/education, Compensatory technique education       Goal Formulation: Patient  Time For Goal Achievement: by time of discharge  ADL Goals  Patient will dress lower body: Minimal Assist, by time of discharge, Not met  Pt will complete bathing: Minimal Assist, by time of discharge, Not met  Patient will toilet: Minimal Assist, by time of discharge, Not met  Mobility and Transfer Goals  Pt will perform functional transfers: Minimal Assist, by time of discharge, Not met  Pt will perform shower transfer: Minimal Assist, by time of discharge, Not met

## 2019-11-25 NOTE — H&P (Signed)
ADMISSION HISTORY AND PHYSICAL EXAM    Date Time: 11/25/19 8:42 AM  Patient Name: [name]  Attending Physician: Forest Becker, MD    Assessment:   Acute on chronic heart failure with preserved ejection fraction  History of COPD  Active smoker  Mood disorder/anxiety/depression  Morbid obesity  Possible sleep apnea  History of coronary artery disease  GERD    Plan:   Last echo 08/14/2019  Start Lasix 40 mg IV twice a day  Strict I's and O's  Daily standing weights  Sodium restricted diet  Start DuoNebs as needed  Hold on steroids at this time  Start aspirin  Check A1c, lipid panel, B12  Continue Advair  Continue gabapentin  Continue oxybutynin  Continue risperidone  Continue Zoloft      DVT: Lovenox    Full code    Disposition: Observation      History of Present Illness:   [name] is a 49 y.o. female who presents to the hospital with worsening lower extremity edema for 4-5 day duration, dyspnea with minimal activity.  Discharged from hospital 1-2 months ago; is not currently taking diuretic.  History of heart failure.  No fevers, chills, no cough.      Past Medical History:     Past Medical History:   Diagnosis Date    Anxiety     Chronic obstructive pulmonary disease     Myocardial infarction     Panic attacks    Chronic heart failure with preserved ejection fraction    Past Surgical History:     Past Surgical History:   Procedure Laterality Date    APPENDECTOMY      ARTHROSCOPIC KNEE, ACL RECONSTRUCTION      R. knee     CHOLECYSTECTOMY         Family History:     Family History   Problem Relation Age of Onset    Heart attack Father     Diabetes Father     Asthma Sister     Diabetes Maternal Grandmother     Diabetes Paternal Grandmother     Heart disease Paternal Grandmother        Social History:     Social History     Socioeconomic History    Marital status: Single     Spouse name: Not on file    Number of children: Not on file    Years of education: Not on file    Highest  education level: Not on file   Occupational History    Not on file   Social Needs    Financial resource strain: Not on file    Food insecurity     Worry: Not on file     Inability: Not on file    Transportation needs     Medical: Not on file     Non-medical: Not on file   Tobacco Use    Smoking status: Former Smoker     Packs/day: 1.00     Years: 25.00     Pack years: 25.00     Types: Cigarettes     Quit date: 12/29/2018     Years since quitting: 0.9    Smokeless tobacco: Never Used   Substance and Sexual Activity    Alcohol use: Never     Frequency: Never    Drug use: Never    Sexual activity: Not on file   Lifestyle    Physical activity     Days  per week: Not on file     Minutes per session: Not on file    Stress: Not on file   Relationships    Social connections     Talks on phone: Not on file     Gets together: Not on file     Attends religious service: Not on file     Active member of club or organization: Not on file     Attends meetings of clubs or organizations: Not on file     Relationship status: Not on file    Intimate partner violence     Fear of current or ex partner: Not on file     Emotionally abused: Not on file     Physically abused: Not on file     Forced sexual activity: Not on file   Other Topics Concern    Not on file   Social History Narrative    Not on file   Current active smoker  Drinks alcohol sparingly    Allergies:     Allergies   Allergen Reactions    Iodine Solution [Povidone Iodine]      Contrast dye        Medications:     Medications Prior to Admission   Medication Sig    indomethacin (INDOCIN) 50 MG capsule Take 50 mg by mouth 3 (three) times daily with meals    albuterol (PROVENTIL HFA;VENTOLIN HFA) 108 (90 Base) MCG/ACT inhaler Inhale 2 puffs into the lungs every 4 (four) hours as needed for Wheezing    albuterol-ipratropium (DUO-NEB) 2.5-0.5(3) mg/3 mL nebulizer Take 3 mLs by nebulization every 6 (six) hours as needed (for SOB or Wheezing)    ALPRAZolam (XANAX)  0.5 MG tablet Take 0.5 mg by mouth 2 (two) times daily as needed       atomoxetine (STRATTERA) 10 MG capsule Take 80 mg by mouth daily       benzonatate (TESSALON) 200 MG capsule Take 1 capsule (200 mg total) by mouth 3 (three) times daily as needed for Cough    cyclobenzaprine (FLEXERIL) 10 MG tablet Take 1 tablet (10 mg total) by mouth 3 (three) times daily as needed for Muscle spasms    famotidine (PEPCID) 20 MG tablet Take 1 tablet (20 mg total) by mouth every 12 (twelve) hours    fluticasone-salmeterol (ADVAIR DISKUS) 500-50 MCG/DOSE Aerosol Powder, Breath Activtivatede Inhale 1 puff into the lungs 2 (two) times daily    gabapentin (NEURONTIN) 800 MG tablet Take 800 mg by mouth 3 (three) times daily    guaiFENesin (MUCINEX) 600 MG 12 hr tablet Take 2 tablets (1,200 mg total) by mouth 2 (two) times daily    lactobacillus/streptococcus (RISAQUAD) Cap Take 1 capsule by mouth daily    naloxone (NARCAN) 4 MG/0.1ML nasal spray 1 spray intranasally. If pt does not respond or relapses into respiratory depression call 911. Give additional doses every 2-3 min.    ondansetron (ZOFRAN-ODT) 4 MG disintegrating tablet Take 1 tablet (4 mg total) by mouth every 6 (six) hours as needed for Nausea    oxybutynin (DITROPAN) 5 MG tablet Take 5 mg by mouth 3 (three) times daily    oxyCODONE-acetaminophen (PERCOCET) 10-325 MG per tablet Take 1 tablet by mouth every 4 (four) hours as needed for Pain    risperiDONE (RISPERDAL) 3 MG tablet Take 3 mg by mouth 2 (two) times daily    sertraline (ZOLOFT) 100 MG tablet Take 100 mg by mouth 2 (two) times daily  Review of Systems:   All systems reviewed and negative    Physical Exam:     Vitals:    11/25/19 0630   BP: 102/71   Pulse: 73   Resp: 22   Temp: 98.1 F (36.7 C)   SpO2: 96%       Intake and Output Summary (Last 24 hours) at Date Time  No intake or output data in the 24 hours ending 11/25/19 0842    GEN APPEARANCE: Normal;  A&OX3  Constitutional: reviewed BP,  HR, RR, SpO2, see above  Eyes: PERRL, EOMI  ENMT:  MMM, oropharynx clear  Resp:  Crackles of the lung bases b/l  CV:  RRR, no r/m/g  GI:  NABS, NT/ND, no organomegaly  MSK:  FROM of the knees, ankles, elbows and wrists b/l without pain  Ext:  1+ edema of the thighs to the feet b/l  Neuro:  5/5 strength of the upper and lower extremities b/l; sensory intact to light touch; CN II-XII intact; finger to nose and heel to shin intact; gait intact  Skin:  No rashes  Psych: Normal affect, normal judgement      Labs:     Results     Procedure Component Value Units Date/Time    COVID-19 (SARS-COV-2) [facility]) [[id number]] Collected: 11/25/19 0742    Specimen: Nasopharyngeal Swab from Nasopharynx Updated: 11/25/19 0810     Purpose of COVID testing Screening     SARS-CoV-2 Specimen Source Nasopharyngeal     SARS CoV 2 Overall Result Negative    Narrative:      o Collect and clearly label specimen type:  o Upper respiratory specimen: One Nasopharyngeal Dry Swab NO  Transport Media.  o Hand deliver to laboratory ASAP  Indication for testing->Extended care facility admission to  semi private room    UA Reflex to Micro - Reflex to Culture [[id number]] Collected: 11/25/19 0649     Updated: 11/25/19 1324     Urine Type Urine, Clean Ca     Color, UA Yellow     Clarity, UA Hazy     Specific Gravity UA 1.019     Urine pH 6.0     Leukocyte Esterase, UA Negative     Nitrite, UA Negative     Protein, UR Negative     Glucose, UA Negative     Ketones UA Negative     Urobilinogen, UA Negative mg/dL      Bilirubin, UA Negative     Blood, UA Negative    Narrative:      Replace urinary catheter prior to obtaining the urine culture  if it has been in place for greater than or equal to 14  days:->N/A No Foley  Indications for U/A Reflex to Micro - Reflex to  Culture:->Suprapubic Pain/Tenderness or Dysuria    B-type Natriuretic Peptide [[id number]] Collected: 11/25/19 0244    Specimen: Blood Updated: 11/25/19 0331     B-Natriuretic Peptide 24  pg/mL     Narrative:      Replace urinary catheter prior to obtaining the urine culture  if it has been in place for greater than or equal to 14  days:->N/A No Foley  Indications for U/A Reflex to Micro - Reflex to  Culture:->Suprapubic Pain/Tenderness or Dysuria    Beta HCG, Quant, Serum [[id number]] Collected: 11/25/19 0244     Updated: 11/25/19 0330     hCG, [place] Brown. <1.2    Narrative:      Replace urinary catheter prior to obtaining  the urine culture  if it has been in place for greater than or equal to 14  days:->N/A No Foley  Indications for U/A Reflex to Micro - Reflex to  Culture:->Suprapubic Pain/Tenderness or Dysuria    Troponin I [[id number]] Collected: 11/25/19 0244    Specimen: Blood Updated: 11/25/19 0330     Troponin I <0.01 ng/mL     Narrative:      Replace urinary catheter prior to obtaining the urine culture  if it has been in place for greater than or equal to 14  days:->N/A No Foley  Indications for U/A Reflex to Micro - Reflex to  Culture:->Suprapubic Pain/Tenderness or Dysuria    Hemolysis index [[id number]]  (Abnormal) Collected: 11/25/19 0244     Updated: 11/25/19 0330     Hemolysis Index 22    Narrative:      Replace urinary catheter prior to obtaining the urine culture  if it has been in place for greater than or equal to 14  days:->N/A No Foley  Indications for U/A Reflex to Micro - Reflex to  Culture:->Suprapubic Pain/Tenderness or Dysuria    GFR [[id number]] Collected: 11/25/19 0244     Updated: 11/25/19 0330     EGFR >60.0    Narrative:      Replace urinary catheter prior to obtaining the urine culture  if it has been in place for greater than or equal to 14  days:->N/A No Foley  Indications for U/A Reflex to Micro - Reflex to  Culture:->Suprapubic Pain/Tenderness or Dysuria    Comprehensive metabolic panel [[id number]]  (Abnormal) Collected: 11/25/19 0244    Specimen: Blood Updated: 11/25/19 0330     Glucose 107 mg/dL      BUN 18.0 mg/dL      Creatinine 0.7 mg/dL      Sodium 141 mEq/L       Potassium 4.3 mEq/L      Chloride 109 mEq/L      CO2 21 mEq/L      Calcium 9.9 mg/dL      Protein, Total 6.1 g/dL      Albumin 3.5 g/dL      AST (SGOT) 10 U/L      ALT 9 U/L      Alkaline Phosphatase 92 U/L      Bilirubin, Total 0.3 mg/dL      Globulin 2.6 g/dL      Albumin/Globulin Ratio 1.3     Anion Gap 11.0    Narrative:      Replace urinary catheter prior to obtaining the urine culture  if it has been in place for greater than or equal to 14  days:->N/A No Foley  Indications for U/A Reflex to Micro - Reflex to  Culture:->Suprapubic Pain/Tenderness or Dysuria    CBC and differential [[id number]]  (Abnormal) Collected: 11/25/19 0244    Specimen: Blood Updated: 11/25/19 0304     WBC 10.91 x10 3/uL      Hgb 13.6 g/dL      Hematocrit 41.2 %      Platelets 269 x10 3/uL      RBC 4.69 x10 6/uL      MCV 87.8 fL      MCH 29.0 pg      MCHC 33.0 g/dL      RDW 14 %      MPV 10.7 fL      Neutrophils 51.7 %      Lymphocytes Automated 33.0 %      Monocytes 7.1 %  Eosinophils Automated 6.6 %      Basophils Automated 1.1 %      Immature Granulocytes 0.5 %      Nucleated RBC 0.0 /100 WBC      Neutrophils Absolute 5.65 x10 3/uL      Lymphocytes Absolute Automated 3.60 x10 3/uL      Monocytes Absolute Automated 0.77 x10 3/uL      Eosinophils Absolute Automated 0.72 x10 3/uL      Basophils Absolute Automated 0.12 x10 3/uL      Immature Granulocytes Absolute 0.05 x10 3/uL      Absolute NRBC 0.00 x10 3/uL     Narrative:      Replace urinary catheter prior to obtaining the urine culture  if it has been in place for greater than or equal to 14  days:->N/A No Foley  Indications for U/A Reflex to Micro - Reflex to  Culture:->Suprapubic Pain/Tenderness or Dysuria            Rads:   Radiological Procedure reviewed.    Chest x-ray: Reviewed.  11/25/2019: Mild pulmonary edema    Signed by: Forest Becker

## 2019-11-25 NOTE — Plan of Care (Signed)
Discharge Recommendation: Home with supervision, Home with home health PT  DME Recommended for Discharge: Front wheel walker(Needs Bariatric Gilford Rile)    Is an Occupational Therapy Evaluation Indicated at this time? This patient is already on OT caseload.     Treatment/Interventions: Continued evaluation, Compensatory technique education, Bed mobility, Equipment eval/education, Patient/family training, Endurance training, LE strengthening/ROM, Functional transfer training, Neuromuscular re-education, Stair training, Gait training, Exercise  PT Frequency: 1-2x/wk     Next Visit Recommendation: PT - Next Visit Recommendation: 11/27/19     PMP Activity: Step 7 - Walks out of Room  Distance Walked (ft) (Step 6,7): 60 Feet  (Please See Therapy Evaluation for device and assistance level needed)    Goals:   Goals  Goal Formulation: With patient  Time for Goal Acheivement: By time of discharge  Goals: Select goal  Pt Will Go Supine To Sit: modified independent, to maximize functional mobility and independence, Partly met  Pt Will Perform Sit To Supine: modified independent, to maximize functional mobility and independence, Partly met  Pt Will Perform Sit to Stand: modified independent, to maximize functional mobility and independence, Partly met(using RW)  Pt Will Transfer Bed/Chair: with rolling walker, modified independent, to maximize functional mobility and independence  Pt Will Ambulate: 101-150 feet, with rolling walker, modified independent, to maximize functional mobility and independence  Pt Will Go Up / Down Stairs: 1 flight, modified independent, With rail, With [facility], to maximize functional mobility and independence

## 2019-11-25 NOTE — ED Provider Notes (Signed)
Pt presents with chest pressure, cough, SOB, and LE edema.  Pt denies fever.  No vomiting/diarrhea.  Pt reports hx of CHF a few months ago, but has not seen a cardiologist.  Pt did not take any medications for her symptoms.  No recent traveling.  Hx from the pt.    Reviewed Past Medical History, Surgical History, Family History and Social as documented.      Review of Systems   Constitutional: Negative.    Respiratory: Positive for cough and shortness of breath.    Cardiovascular: Positive for chest pain and leg swelling.   Gastrointestinal: Negative.    All other systems reviewed and are negative.    Physical Exam  Vitals signs and nursing note reviewed.   Constitutional:       Appearance: Normal appearance.   HENT:      Head: Normocephalic and atraumatic.   Eyes:      Extraocular Movements: Extraocular movements intact.   Neck:      Musculoskeletal: Normal range of motion and neck supple.   Cardiovascular:      Rate and Rhythm: Normal rate and regular rhythm.      Pulses: Normal pulses.      Heart sounds: Normal heart sounds.   Pulmonary:      Effort: Pulmonary effort is normal.      Breath sounds: Wheezing present.   Abdominal:      General: There is no distension.      Tenderness: There is no abdominal tenderness.   Musculoskeletal: Normal range of motion.      Right lower leg: No edema.      Left lower leg: No edema.   Skin:     General: Skin is warm and dry.   Neurological:      General: No focal deficit present.      Mental Status: She is alert and oriented to person, place, and time.     Oxygen saturations are 97%    EKG: normal sinus rhythm, nl axis, nl intervals, anterior TWIs.    Cardiac monitor: NSR rate 70s    D/w Dr. Marcy Siren, for admission.      Impression:  1. Chest tightness    2. Shortness of breath             Darcella Cheshire, MD  11/27/19 [phone]

## 2019-11-25 NOTE — Progress Notes (Deleted)
Situation Chest pain  R/O COVID     Background Past Medical History:   Anxiety    Chronic obstructive pulmonary disease    Myocardial infarction    Panic attacks      Assessment CM spoke with pt over the phone. Pt lives w/ his wife. He is independent w/ ADLs, IADLs, and ambulation.  PCP and demographics verified.   Recommendation D/c home, self-care. No d/c needs identified. Wife will transport.               Dede Query, RN MSN       Clinical Case Manager       [facility]        [address], [place], [place] [address]      [email or web address]      [phone]         11/25/19 1326   Patient Type   Within 30 Days of Previous Admission? No   Healthcare Decisions   Interviewed: Patient   Orientation/Decision Making Abilities of Patient Alert and Oriented x3, able to make decisions   Prior to admission   Prior level of function Independent with ADLs;Ambulates independently   Type of Residence Private residence   Have running water, electricity, heat, etc? Yes   Living Arrangements Spouse/significant other   How do you get to your MD appointments? self   How do you get your groceries? self   Who fixes your meals? self   Who does your laundry? self   Who picks up your prescriptions? self   Dressing Independent   Grooming Independent   Feeding Independent   Bathing Independent   Toileting Independent   Home Care/Community Services None   Adult Protective Services (APS) involved? No   Discharge Planning   Support Systems Spouse/significant other   Patient expects to be discharged to: home.No d/c needs   Anticipated [place] plan discussed with: Patient   Mode of transportation: Private car (family member)  (wife)   Does the patient have perscription coverage? Yes   Consults/Providers   PT Evaluation Needed 2   OT Evalulation Needed 2   SLP Evaluation Needed 2   Outcome Palliative Care Screen Screened but did not meet criteria for intervention   Correct PCP listed in Epic? Yes   Family and PCP   PCP on  file was verified as the current PCP? Yes   Important Message from [facility] Notice   Patient received 1st IMM Letter? n/a

## 2019-11-25 NOTE — ED Notes (Signed)
[facility]  ED NURSING NOTE FOR THE RECEIVING INPATIENT NURSE   ED NURSE Cleotis Nipper   [facility] [address]   ED CHARGE RN [phone]   ADMISSION INFORMATION   [name] is a 49 y.o. female admitted with a diagnosis of:    1. Chest tightness    2. Shortness of breath         Isolation: None   Allergies: Iodine solution [povidone iodine]   Holding Orders confirmed? Yes   Belongings Documented? Yes   Home medications sent to pharmacy confirmed? No   NURSING CARE   Patient Comes From:   Mental Status: Home Independent  alert and oriented   ADL: Independent with all ADLs   Ambulation: mild difficulty   Pertinent Information  and Safety Concerns: NA     VITAL SIGNS   Time BP Temp Pulse Resp SpO2   0630 102/71 98.1 73 22 96   CT / NIH   CT Head ordered on this patient?  No   NIH/Dysphagia assessment done prior to admission? No   PERSONAL PROTECTIVE EQUIPMENT   Gloves   LAB RESULTS   Labs Reviewed   CBC AND DIFFERENTIAL - Abnormal; Notable for the following components:       Result Value    WBC 10.91 (*)     Lymphocytes Absolute Automated 3.60 (*)     Eosinophils Absolute Automated 0.72 (*)     Basophils Absolute Automated 0.12 (*)     All other components within normal limits    Narrative:     Replace urinary catheter prior to obtaining the urine culture  if it has been in place for greater than or equal to 14  days:->N/A No Foley  Indications for U/A Reflex to Micro - Reflex to  Culture:->Suprapubic Pain/Tenderness or Dysuria   COMPREHENSIVE METABOLIC PANEL - Abnormal; Notable for the following components:    Glucose 107 (*)     CO2 21 (*)     All other components within normal limits    Narrative:     Replace urinary catheter prior to obtaining the urine culture  if it has been in place for greater than or equal to 14  days:->N/A No Foley  Indications for U/A Reflex to Micro - Reflex to  Culture:->Suprapubic Pain/Tenderness or Dysuria   HEMOLYSIS INDEX - Abnormal; Notable for the following  components:    Hemolysis Index 22 (*)     All other components within normal limits    Narrative:     Replace urinary catheter prior to obtaining the urine culture  if it has been in place for greater than or equal to 14  days:->N/A No Foley  Indications for U/A Reflex to Micro - Reflex to  Culture:->Suprapubic Pain/Tenderness or Dysuria   COVID-19 (SARS-COV-2)   B-TYPE NATRIURETIC PEPTIDE    Narrative:     Replace urinary catheter prior to obtaining the urine culture  if it has been in place for greater than or equal to 14  days:->N/A No Foley  Indications for U/A Reflex to Micro - Reflex to  Culture:->Suprapubic Pain/Tenderness or Dysuria   HCG QUANTITATIVE    Narrative:     Replace urinary catheter prior to obtaining the urine culture  if it has been in place for greater than or equal to 14  days:->N/A No Foley  Indications for U/A Reflex to Micro - Reflex to  Culture:->Suprapubic Pain/Tenderness or Dysuria   TROPONIN I    Narrative:  Replace urinary catheter prior to obtaining the urine culture  if it has been in place for greater than or equal to 14  days:->N/A No Foley  Indications for U/A Reflex to Micro - Reflex to  Culture:->Suprapubic Pain/Tenderness or Dysuria   URINALYSIS REFLEX TO MICROSCOPIC EXAM - REFLEX TO CULTURE    Narrative:     Replace urinary catheter prior to obtaining the urine culture  if it has been in place for greater than or equal to 14  days:->N/A No Foley  Indications for U/A Reflex to Micro - Reflex to  Culture:->Suprapubic Pain/Tenderness or Dysuria   GFR    Narrative:     Replace urinary catheter prior to obtaining the urine culture  if it has been in place for greater than or equal to 14  days:->N/A No Foley  Indications for U/A Reflex to Micro - Reflex to  Culture:->Suprapubic Pain/Tenderness or Dysuria          Ticket to Ride Printed: No

## 2019-11-25 NOTE — Progress Notes (Signed)
Situation Chest pain  SOB  CHF  LE pain     Background Past Medical History:   Anxiety    Chronic obstructive pulmonary disease    Myocardial infarction    Panic attacks      Assessment CM spoke with pt at bedside.   Pt lives w/ her fiance and his mother. 3 FOS to her apartment w/o elevator.   According to pt she is independent with ADLs and IADLs. According to pt she ambulates with a walker.   Pt denies [place] services, SNF, [place].   Uses home O2 at night PRN.   Address and PCP verified.   Pt takes "call a bus to her appointments." Stated that the bus is not running now due to [place].        Recommendation    DCP- pending PT/OT eval. Home w/ fiance and probably [address] services. Friend will trasport vs wheelchair van vs PTS. Will follow.               Dede Query, RN MSN       Clinical Case Manager       [facility]        [address] [place], [place] [address]      [email or web address]      [phone]         11/25/19 1356   Patient Type   Within 30 Days of Previous Admission? No   Healthcare Decisions   Interviewed: Patient   Orientation/Decision Making Abilities of Patient Alert and Oriented x3, able to make decisions   Prior to admission   Prior level of function Ambulates with assistive device;Independent with ADLs   Type of Residence Private residence   Home Layout Multi-level   Have running water, electricity, heat, etc? Yes   Living Arrangements Spouse/significant other   How do you get to your MD appointments? bus   How do you get your groceries? fiance   Who fixes your meals? self   Who does your laundry? self   Who picks up your prescriptions? fiance   Dressing Independent   Grooming Independent   Feeding Independent   Bathing Independent   Toileting Independent   DME Currently at [facility], [place] [facility];Home O2   Name of Prior [place] [facility] None   Prior SNF admission? (Detail) n/a   Adult Protective Services (APS) involved? No   Discharge  Planning   Support Systems Spouse/significant other   Patient expects to be discharged to: home   Anticipated [place] plan discussed with: Patient   Mode of transportation: Private car (family member)   Does the patient have perscription coverage? Yes   Consults/Providers   PT Evaluation Needed 1   OT Evalulation Needed 1   SLP Evaluation Needed 2   Outcome Palliative Care Screen Screened but did not meet criteria for intervention   Correct PCP listed in Epic? Yes   Family and PCP   PCP on file was verified as the current PCP? Yes   Important Message from [facility] Notice   Patient received 1st IMM Letter? n/a

## 2019-11-26 LAB — HEPATIC FUNCTION PANEL
ALT: 8 U/L (ref 0–55)
AST (SGOT): 8 U/L (ref 5–34)
Albumin/Globulin Ratio: 1.3 (ref 0.9–2.2)
Albumin: 3 g/dL — ABNORMAL LOW (ref 3.5–5.0)
Alkaline Phosphatase: 78 U/L (ref 37–106)
Bilirubin Direct: 0.1 mg/dL (ref 0.0–0.5)
Bilirubin Indirect: 0.2 mg/dL (ref 0.2–1.0)
Bilirubin, Total: 0.3 mg/dL (ref 0.2–1.2)
Globulin: 2.3 g/dL (ref 2.0–3.6)
Protein, Total: 5.3 g/dL — ABNORMAL LOW (ref 6.0–8.3)

## 2019-11-26 LAB — RENAL FUNCTION PANEL
Anion Gap: 8 (ref 5.0–15.0)
BUN: 21 mg/dL — ABNORMAL HIGH (ref 7.0–19.0)
CO2: 23 mEq/L (ref 22–29)
Calcium: 9.3 mg/dL (ref 8.5–10.5)
Chloride: 109 mEq/L (ref 100–111)
Creatinine: 0.7 mg/dL (ref 0.6–1.0)
Glucose: 98 mg/dL (ref 70–100)
Phosphorus: 4 mg/dL (ref 2.3–4.7)
Potassium: 4.2 mEq/L (ref 3.5–5.1)
Sodium: 140 mEq/L (ref 136–145)

## 2019-11-26 LAB — CBC
Absolute NRBC: 0 10*3/uL (ref 0.00–0.00)
Hematocrit: 39.5 % (ref 34.7–43.7)
Hgb: 12.5 g/dL (ref 11.4–14.8)
MCH: 28.8 pg (ref 25.1–33.5)
MCHC: 31.6 g/dL (ref 31.5–35.8)
MCV: 91 fL (ref 78.0–96.0)
MPV: 10.9 fL (ref 8.9–12.5)
Nucleated RBC: 0 /100 WBC (ref 0.0–0.0)
Platelets: 263 10*3/uL (ref 142–346)
RBC: 4.34 10*6/uL (ref 3.90–5.10)
RDW: 14 % (ref 11–15)
WBC: 8.67 10*3/uL (ref 3.10–9.50)

## 2019-11-26 LAB — GFR: EGFR: >60

## 2019-11-26 LAB — HEMOLYSIS INDEX: Hemolysis Index: 14 (ref 0–18)

## 2019-11-26 LAB — MAGNESIUM: Magnesium: 2.2 mg/dL (ref 1.6–2.6)

## 2019-11-26 MED ORDER — NICOTINE 14 MG/24HR TD PT24
1.00 | MEDICATED_PATCH | Freq: Every day | TRANSDERMAL | Status: DC
Start: 2019-11-27 — End: 2019-11-29
  Administered 2019-11-27 – 2019-11-29 (×3): 1 via TRANSDERMAL
  Filled 2019-11-26 (×3): qty 1

## 2019-11-26 MED ORDER — ALPRAZOLAM 0.5 MG PO TABS
0.5000 mg | ORAL_TABLET | Freq: Two times a day (BID) | ORAL | Status: DC | PRN
Start: 2019-11-26 — End: 2019-11-29

## 2019-11-26 MED ORDER — INDOMETHACIN 25 MG PO CAPS
50.00 mg | ORAL_CAPSULE | Freq: Three times a day (TID) | ORAL | Status: DC
Start: 2019-11-26 — End: 2019-11-29
  Administered 2019-11-26 – 2019-11-29 (×10): 50 mg via ORAL
  Filled 2019-11-26 (×12): qty 2

## 2019-11-26 MED ORDER — OXYCODONE HCL 5 MG PO TABS
10.0000 mg | ORAL_TABLET | ORAL | Status: DC | PRN
Start: 2019-11-26 — End: 2019-11-29
  Administered 2019-11-26 – 2019-11-29 (×3): 10 mg via ORAL
  Filled 2019-11-26 (×3): qty 2

## 2019-11-26 NOTE — UM Notes (Addendum)
[facility] Utilization Review   NPI #[id number], Tax ID [id number]  Please call Milas Gain MSN RN CCM @ [phone] with any questions or concerns.  Email:  [email or web address]  Fax final authorization and requests for additional information to [phone]    DATE/TIME OF ADMISSION ORDER: 11/26/19 0846 (CHANGED TO INPATIENT)  CONCURRENT REVIEW FOR: 11/26/19    LOC: ACUTE    PATIENT NAME: [name],[name] / December 30, 1970 / AGE: 49 y.o.      S/I: DESATS TO 87%-90% WHILE ASLEEP PER NURSING; ON 2L/MIN NC; REQUIRING CONTINUED IV DIURESIS; PAIN CONTROL; IV NARCOTICS        V/S:    Vitals:    11/26/19 1518   BP: 109/69   Pulse: 84   Resp: 16   Temp: 97.9 F (36.6 C)   SpO2: 93%          Temperature: Temp  Min: 97.5 F (36.4 C)  Max: 98.6 F (37 C)  Pulse: Pulse  Min: 60  Max: 84  Respiratory: Resp  Min: 16  Max: 18  Non-Invasive BP: BP  Min: 96/63  Max: 112/75  Pulse Oximetry SpO2  Min: 93 %  Max: 95 %  PAIN MAX 9/10    Labs -  BUN 21; ALBUMIN 3             MD NOTES:    H&P ON 11/25/19  History of Present Illness:   [name] is a 49 y.o. female who presents to the hospital with worsening lower extremity edema for 4-5 day duration, dyspnea with minimal activity.  Discharged from hospital 1-2 months ago; is not currently taking diuretic.  History of heart failure.  No fevers, chills, no cough.      Assessment:   Acute on chronic heart failure with preserved ejection fraction  History of COPD  Active smoker  Mood disorder/anxiety/depression  Morbid obesity  Possible sleep apnea  History of coronary artery disease  GERD    Plan:   Last echo 08/14/2019  Start Lasix 40 mg IV twice a day  Strict I's and O's  Daily standing weights  Sodium restricted diet  Start DuoNebs as needed  Hold on steroids at this time  Start aspirin  Check A1c, lipid panel, B12  Continue Advair  Continue gabapentin  Continue oxybutynin  Continue risperidone  Continue Zoloft    DVT: Lovenox  Full code      MEDICINE NOTES   ON 1/6  ASSESSMENT/PLAN     [name] is a 49 y.o. female admitted with Chest tightness      Active Hospital Problems    Diagnosis    Chest tightness     Acute on chronic heart failure with preserved ejection fraction  Lower extremity neuropathy  HTN      Continue lasix  Patient feeling better  Increase opioid for pain  Continue gabapentin  Sodium restricted diet    Analgesia: APAP    Nutrition: Sodium restricted diet    DVT Prophylaxis:Lovenox  DISPO: Continued inpatient       Current Medications:   Scheduled Meds:  Current Facility-Administered Medications   Medication Dose Route Frequency    acetaminophen  1,000 mg Oral TID    albuterol-ipratropium  3 mL Nebulization Q6H SCH    enoxaparin  40 mg Subcutaneous Daily    fluticasone furoate-vilanterol  1 puff Inhalation QAM    furosemide  40 mg Intravenous BID    gabapentin  800 mg Oral TID  oxybutynin  10 mg Oral TID    risperiDONE  3 mg Oral BID    sertraline  100 mg Oral BID     Continuous Infusions:  PRN Meds:.HYDROmorphone, naloxone, oxyCODONE     DILAUDID 1 MG IV--X2  OXYCODONE 10 MG PO--X3

## 2019-11-26 NOTE — Progress Notes (Signed)
[facility]  PROGRESS NOTE      Patient: [name]  Date: 11/26/2019   LOS: 0 Days  Admission Date: 11/25/2019   MRN: [id number]  Attending: Forest Becker  Please contact me on the following Epic Chat Xtended paging       ASSESSMENT/PLAN     [name] is a 49 y.o. female admitted with Chest tightness    Interval Summary:     Active Hospital Problems    Diagnosis    Chest tightness     Acute on chronic heart failure with preserved ejection fraction  Lower extremity neuropathy  HTN      Continue lasix  Patient feeling better  Increase opioid for pain  Continue gabapentin  Sodium restricted diet          Analgesia: APAP    Nutrition: Sodium restricted diet    DVT Prophylaxis:Lovenox       Code Status: FULL CODE    DISPO: Continued inaptient     Family Contact:        SUBJECTIVE     [name] states she is feeling better    MEDICATIONS     Current Facility-Administered Medications   Medication Dose Route Frequency    acetaminophen  1,000 mg Oral TID    albuterol-ipratropium  3 mL Nebulization Q6H [place]    enoxaparin  40 mg Subcutaneous Daily    fluticasone furoate-vilanterol  1 puff Inhalation QAM    furosemide  40 mg Intravenous BID    gabapentin  800 mg Oral TID    indomethacin  50 mg Oral TID MEALS    oxybutynin  10 mg Oral TID    risperiDONE  3 mg Oral BID    sertraline  100 mg Oral BID       PHYSICAL EXAM     Vitals:    11/26/19 1518   BP: 109/69   Pulse: 84   Resp: 16   Temp: 97.9 F (36.6 C)   SpO2: 93%       Temperature: Temp  Min: 97.5 F (36.4 C)  Max: 98.6 F (37 C)  Pulse: Pulse  Min: 60  Max: 84  Respiratory: Resp  Min: 16  Max: 18  Non-Invasive BP: BP  Min: 96/63  Max: 112/75  Pulse Oximetry SpO2  Min: 93 %  Max: 95 %    Intake and Output Summary (Last 24 hours) at Date Time    Intake/Output Summary (Last 24 hours) at 11/26/2019 1933  Last data filed at 11/26/2019 1600  Gross per 24 hour   Intake 1250 ml   Output --   Net 1250 ml         GEN APPEARANCE: Normal;   A&OX3  HEENT: PERLA; EOMI; Conjunctiva Clear  NECK: Supple; No bruits  CVS: RRR, S1, S2; No M/G/R  LUNGS: CTAB; No Wheezes; No Rhonchi: No rales  ABD: Soft; No TTP; + Normoactive BS  EXT: 1+ edema of the legs b/l  Skin exam:  pink  NEURO: CN 2-12 intact; No Focal neurological deficits  CAP REFILL:  Normal  MENTAL STATUS:  Normal    Exam done by Forest Becker, MD on 11/26/19 at 7:33 PM      LABS     Recent Labs   Lab 11/26/19  0523 11/25/19  0244   WBC 8.67 10.91*   RBC 4.34 4.69   Hgb 12.5 13.6   Hematocrit 39.5 41.2   MCV 91.0 87.8  Platelets 263 269       Recent Labs   Lab 11/26/19  0523 11/25/19  0244   Sodium 140 141   Potassium 4.2 4.3   Chloride 109 109   CO2 23 21*   BUN 21.0* 18.0   Creatinine 0.7 0.7   Glucose 98 107*   Calcium 9.3 9.9   Magnesium 2.2  --        Recent Labs   Lab 11/26/19  0523 11/25/19  0244   ALT 8 9   AST (SGOT) 8 10   Bilirubin, Total 0.3 0.3   Bilirubin Direct 0.1  --    Albumin 3.0* 3.5   Alkaline Phosphatase 78 92       Recent Labs   Lab 11/25/19  0244   Troponin I <0.01             Microbiology Results     Procedure Component Value Units Date/Time    COVID-19 (SARS-COV-2) [facility]) [[id number]] Collected: 11/25/19 0742    Specimen: Nasopharyngeal Swab from Nasopharynx Updated: 11/25/19 0810     Purpose of COVID testing Screening     SARS-CoV-2 Specimen Source Nasopharyngeal     SARS CoV 2 Overall Result Negative     Comment: Test performed using the Abbott ID NOW EUA assay.  Please see Fact Sheets for patients and providers located at:  http://olson-hall.info/  This test is for the qualitative detection of SARS-CoV-2  (COVID19) nucleic acid. Viral nucleic acids may persist in vivo,  independent of viability. Detection of viral nucleic acid does  not imply the presence of infectious virus, or that virus  nucleic acid is the cause of clinical symptoms. Negative  results should be treated as presumptive and, if inconsistent  with clinical signs and symptoms or  necessary for patient  management, should be tested with an alternative molecular  assay. Negative results do not preclude SARS-CoV-2 infection  and should not be used as the sole basis for patient  management decisions. Invalid results may be due to inhibiting  substances in the specimen and recollection should occur.         Narrative:      o Collect and clearly label specimen type:  o Upper respiratory specimen: One Nasopharyngeal Dry Swab NO  Transport Media.  o Hand deliver to laboratory ASAP  Indication for testing->Extended care facility admission to  semi private room           Xr Chest  Ap Portable    Result Date: 11/24/2019  No acute process. Delaney Meigs, MD  11/24/2019 11:18 PM        Echo Results     None          RADIOLOGY     Upon my review:    Signed,  Forest Becker  7:33 PM 11/26/2019

## 2019-11-26 NOTE — Plan of Care (Signed)
Pt is alert and oriented x4. C/o pain once during shift given PRN pain medication for relief. Pt has q6hr [employer]. IV Lasix. Fall bundle in place. VSS will continue to monitor.         Problem: Pain  Goal: Pain at adequate level as identified by patient  Outcome: Progressing  Flowsheets (Taken 11/26/2019 0828)  Pain at adequate level as identified by patient:   Identify patient comfort function goal   Assess for risk of opioid induced respiratory depression, including snoring/sleep apnea. Alert healthcare team of risk factors identified.   Assess pain on admission, during daily assessment and/or before any "as needed" intervention(s)   Reassess pain within 30-60 minutes of any procedure/intervention, per Pain Assessment, Intervention, Reassessment (AIR) Cycle   Evaluate if patient comfort function goal is met   Evaluate patient's satisfaction with pain management progress   Offer non-pharmacological pain management interventions   Consult/collaborate with Pain Service   Consult/collaborate with Physical Therapy, Occupational Therapy, and/or Speech Therapy   Include patient/patient care companion in decisions related to pain management as needed     Problem: Side Effects from Pain Analgesia  Goal: Patient will experience minimal side effects of analgesic therapy  Outcome: Progressing  Flowsheets (Taken 11/26/2019 8101)  Patient will experience minimal side effects of analgesic therapy:   Monitor/assess patient's respiratory status (RR depth, effort, breath sounds)   Assess for changes in cognitive function   Prevent/manage side effects per LIP orders (i.e. nausea, vomiting, pruritus, constipation, urinary retention, etc.)   Evaluate for opioid-induced sedation with appropriate assessment tool (i.e. POSS)

## 2019-11-26 NOTE — Plan of Care (Addendum)
Pt is A/O x 4, c/o leg and back pain. Prn pain medication was given. SR on the monitor. Oral intake is adequate. Voiding adequately. POC was discussed with the patient. Safety precautions have been implemented.     Problem: Safety  Goal: Patient will be free from injury during hospitalization  Outcome: Progressing  Flowsheets (Taken 11/26/2019 1910)  Patient will be free from injury during hospitalization:   Assess patient's risk for falls and implement fall prevention plan of care per policy   Ensure appropriate safety devices are available at the bedside   Use appropriate transfer methods   Include patient/ family/ care giver in decisions related to safety   Hourly rounding   Assess for patients risk for elopement and implement Elopement Risk Plan per policy     Problem: Pain  Goal: Pain at adequate level as identified by patient  Outcome: Progressing  Flowsheets (Taken 11/26/2019 1910)  Pain at adequate level as identified by patient:   Identify patient comfort function goal   Assess for risk of opioid induced respiratory depression, including snoring/sleep apnea. Alert healthcare team of risk factors identified.   Assess pain on admission, during daily assessment and/or before any "as needed" intervention(s)   Reassess pain within 30-60 minutes of any procedure/intervention, per Pain Assessment, Intervention, Reassessment (AIR) Cycle   Evaluate if patient comfort function goal is met   Offer non-pharmacological pain management interventions   Include patient/patient care companion in decisions related to pain management as needed     Problem: Everyday - Heart Failure  Goal: Stable Vital Signs and Fluid Balance  Outcome: Progressing  Flowsheets (Taken 11/26/2019 1910)  Stable Vital Signs and Fluid Balance:   Monitor, assess vital signs and telemetry per policy   Monitor labs and report abnormalities to physician   Strict Intake/Output   Assess for swelling/edema     Problem: Compromised Hemodynamic  Status  Goal: Vital signs and fluid balance maintained/improved  Outcome: Progressing  Flowsheets (Taken 11/26/2019 1910)  Vital signs and fluid balance are maintained/improved:   Position patient for maximum circulation/cardiac output   Monitor/assess vitals and hemodynamic parameters with position changes   Monitor intake and output. Notify LIP if urine output is less than 30 mL/hour.   Monitor/assess lab values and report abnormal values     Problem: Inadequate Gas Exchange  Goal: Adequate oxygenation and improved ventilation  Outcome: Progressing  Flowsheets (Taken 11/26/2019 1910)  Adequate oxygenation and improved ventilation:   Assess lung sounds   Monitor SpO2 and treat as needed   Position for maximum ventilatory efficiency   Increase activity as tolerated/progressive mobility   Plan activities to conserve energy: plan rest periods

## 2019-11-27 LAB — RENAL FUNCTION PANEL
Anion Gap: 8 (ref 5.0–15.0)
BUN: 17 mg/dL (ref 7.0–19.0)
CO2: 26 mEq/L (ref 22–29)
Calcium: 9.3 mg/dL (ref 8.5–10.5)
Chloride: 104 mEq/L (ref 100–111)
Creatinine: 0.9 mg/dL (ref 0.6–1.0)
Glucose: 123 mg/dL — ABNORMAL HIGH (ref 70–100)
Phosphorus: 4.6 mg/dL (ref 2.3–4.7)
Potassium: 4.2 mEq/L (ref 3.5–5.1)
Sodium: 138 mEq/L (ref 136–145)

## 2019-11-27 LAB — HEPATIC FUNCTION PANEL
ALT: 9 U/L (ref 0–55)
AST (SGOT): 9 U/L (ref 5–34)
Albumin/Globulin Ratio: 1.2 (ref 0.9–2.2)
Albumin: 3.2 g/dL — ABNORMAL LOW (ref 3.5–5.0)
Alkaline Phosphatase: 88 U/L (ref 37–106)
Bilirubin Direct: 0.1 mg/dL (ref 0.0–0.5)
Bilirubin Indirect: 0.1 mg/dL — ABNORMAL LOW (ref 0.2–1.0)
Bilirubin, Total: 0.2 mg/dL (ref 0.2–1.2)
Globulin: 2.7 g/dL (ref 2.0–3.6)
Protein, Total: 5.9 g/dL — ABNORMAL LOW (ref 6.0–8.3)

## 2019-11-27 LAB — CBC
Absolute NRBC: 0 10*3/uL (ref 0.00–0.00)
Hematocrit: 39.9 % (ref 34.7–43.7)
Hgb: 12.5 g/dL (ref 11.4–14.8)
MCH: 28.9 pg (ref 25.1–33.5)
MCHC: 31.3 g/dL — ABNORMAL LOW (ref 31.5–35.8)
MCV: 92.1 fL (ref 78.0–96.0)
MPV: 10.9 fL (ref 8.9–12.5)
Nucleated RBC: 0 /100 WBC (ref 0.0–0.0)
Platelets: 279 10*3/uL (ref 142–346)
RBC: 4.33 10*6/uL (ref 3.90–5.10)
RDW: 14 % (ref 11–15)
WBC: 9.61 10*3/uL — ABNORMAL HIGH (ref 3.10–9.50)

## 2019-11-27 LAB — MAGNESIUM: Magnesium: 2.1 mg/dL (ref 1.6–2.6)

## 2019-11-27 LAB — HEMOLYSIS INDEX: Hemolysis Index: 14 (ref 0–18)

## 2019-11-27 LAB — GFR: EGFR: >60

## 2019-11-27 MED ORDER — FUROSEMIDE 10 MG/ML IJ SOLN
20.00 mg | Freq: Once | INTRAMUSCULAR | Status: AC
Start: 2019-11-27 — End: 2019-11-27
  Administered 2019-11-27: 17:00:00 20 mg via INTRAVENOUS
  Filled 2019-11-27: qty 2

## 2019-11-27 NOTE — Plan of Care (Signed)
Patient alert and oriented x4, able to verbalize needs and assisted as needed. Patient verbalized pain intermittently and medication were administered as ordered. Patient remains continent of bowel and bladder and uses bedside commode. Pt complained of muscle spasm x 1 and patient was educated to increase intake of water as she had not been drinking all day. Dr was notified.       Problem: Safety  Goal: Patient will be free from injury during hospitalization  Outcome: Progressing  Flowsheets (Taken 11/27/2019 0059 by Shelda Altes, RN)  Patient will be free from injury during hospitalization:   Assess patient's risk for falls and implement fall prevention plan of care per policy   Use appropriate transfer methods   Ensure appropriate safety devices are available at the bedside   Provide and maintain safe environment   Include patient/ family/ care giver in decisions related to safety   Hourly rounding     Problem: Pain  Goal: Pain at adequate level as identified by patient  Outcome: Progressing  Flowsheets (Taken 11/26/2019 1910 by Laural Golden, RN)  Pain at adequate level as identified by patient:   Identify patient comfort function goal   Assess for risk of opioid induced respiratory depression, including snoring/sleep apnea. Alert healthcare team of risk factors identified.   Assess pain on admission, during daily assessment and/or before any "as needed" intervention(s)   Reassess pain within 30-60 minutes of any procedure/intervention, per Pain Assessment, Intervention, Reassessment (AIR) Cycle   Evaluate if patient comfort function goal is met   Offer non-pharmacological pain management interventions   Include patient/patient care companion in decisions related to pain management as needed     Problem: Side Effects from Pain Analgesia  Goal: Patient will experience minimal side effects of analgesic therapy  Outcome: Progressing  Flowsheets (Taken 11/26/2019 0828 by Lady Gary, RN)  Patient will experience  minimal side effects of analgesic therapy:   Monitor/assess patient's respiratory status (RR depth, effort, breath sounds)   Assess for changes in cognitive function   Prevent/manage side effects per LIP orders (i.e. nausea, vomiting, pruritus, constipation, urinary retention, etc.)   Evaluate for opioid-induced sedation with appropriate assessment tool (i.e. POSS)

## 2019-11-27 NOTE — Progress Notes (Signed)
[facility]  PROGRESS NOTE      Patient: [name]  Date: 11/27/2019   LOS: 1 Days  Admission Date: 11/25/2019   MRN: [id number]  Attending: Forest Becker  Please contact me on the following Epic Chat Xtended paging       ASSESSMENT/PLAN     ILZE ROSELLI is a 49 y.o. female admitted with Chest tightness    Interval Summary:     Active Hospital Problems    Diagnosis    Chest tightness     Acute on chronic heart failure with preserved ejection fraction, improving  Lower extremity neuropathy  HTN  COPD, without exacerbation  Active tobacco smoking  Mood mood disorder: Anxiety with depression  Morbid obesity  Possible sleep apnea  History of coronary artery disease  GERD  Asymptomatic hypotension      Holding afternoon lasix  -  Patient continues to report lower extremity edema  -  Give additional lasix 20 mg IV this evening  Reassess BUN/Cr and clinical exam tmrw- may be able to transition to oral diuretic  Continue duo nebs  Continue Breo  Continue acetaminophen 3 times daily  Continue indomethacin  Continue Ditropan  Continue Risperdal  Continue Zoloft  Continue gabapentin  Patient feeling better        Analgesia: APAP    Nutrition: Sodium restricted diet    DVT Prophylaxis:Lovenox       Code Status: FULL CODE    DISPO: Continued inaptient     Family Contact:        SUBJECTIVE     [name] states she is feeling better    1/7-continues to have lower extremity pain.  Improving edema but reports continued swelling of the left leg and right thigh.  Breathing okay.    MEDICATIONS     Current Facility-Administered Medications   Medication Dose Route Frequency    acetaminophen  1,000 mg Oral TID    albuterol-ipratropium  3 mL Nebulization Q6H SCH    enoxaparin  40 mg Subcutaneous Daily    fluticasone furoate-vilanterol  1 puff Inhalation QAM    gabapentin  800 mg Oral TID    indomethacin  50 mg Oral TID MEALS    nicotine  1 patch Transdermal Daily    oxybutynin  10 mg Oral TID     risperiDONE  3 mg Oral BID    sertraline  100 mg Oral BID       PHYSICAL EXAM     Vitals:    11/27/19 1217   BP: 90/58   Pulse: 80   Resp: 19   Temp: 97.9 F (36.6 C)   SpO2: 94%       Temperature: Temp  Min: 97.9 F (36.6 C)  Max: 98.6 F (37 C)  Pulse: Pulse  Min: 71  Max: 87  Respiratory: Resp  Min: 16  Max: 19  Non-Invasive BP: BP  Min: 88/56  Max: 99/66  Pulse Oximetry SpO2  Min: 87 %  Max: 95 %    Intake and Output Summary (Last 24 hours) at Date Time    Intake/Output Summary (Last 24 hours) at 11/27/2019 1736  Last data filed at 11/27/2019 0900  Gross per 24 hour   Intake 760 ml   Output 500 ml   Net 260 ml         GEN APPEARANCE: Normal;  A&OX3  HEENT: PERLA; EOMI; Conjunctiva Clear  NECK: Supple; No bruits  CVS: RRR, S1, S2;  No M/G/R  LUNGS: CTAB; No Wheezes; No Rhonchi: No rales  ABD: Soft; No TTP; + Normoactive BS  EXT: Nonpitting edema of the legs bilaterally  Skin exam:  pink  NEURO: CN 2-12 intact; No Focal neurological deficits  CAP REFILL:  Normal  MENTAL STATUS:  Normal    Exam done by Forest Becker, MD on 11/27/19 at 7:33 PM      LABS     Recent Labs   Lab 11/27/19  0436 11/26/19  0523 11/25/19  0244   WBC 9.61* 8.67 10.91*   RBC 4.33 4.34 4.69   Hgb 12.5 12.5 13.6   Hematocrit 39.9 39.5 41.2   MCV 92.1 91.0 87.8   Platelets 279 263 269       Recent Labs   Lab 11/27/19  0436 11/26/19  0523 11/25/19  0244   Sodium 138 140 141   Potassium 4.2 4.2 4.3   Chloride 104 109 109   CO2 26 23 21*   BUN 17.0 21.0* 18.0   Creatinine 0.9 0.7 0.7   Glucose 123* 98 107*   Calcium 9.3 9.3 9.9   Magnesium 2.1 2.2  --        Recent Labs   Lab 11/27/19  0436 11/26/19  0523 11/25/19  0244   ALT _0 AST (SGOT) _1 Bilirubin, Total 0.2 0.3 0.3   Bilirubin Direct 0.1 0.1  --    Albumin 3.2* 3.0* 3.5   Alkaline Phosphatase 88 78 92       Recent Labs   Lab 11/25/19  0244   Troponin I <0.01             Microbiology Results     Procedure Component Value Units Date/Time    COVID-19 (SARS-COV-2) [facility])  [[id number]] Collected: 11/25/19 0742    Specimen: Nasopharyngeal Swab from Nasopharynx Updated: 11/25/19 0810     Purpose of COVID testing Screening     SARS-CoV-2 Specimen Source Nasopharyngeal     SARS CoV 2 Overall Result Negative     Comment: Test performed using the Abbott ID NOW EUA assay.  Please see Fact Sheets for patients and providers located at:  http://olson-hall.info/  This test is for the qualitative detection of SARS-CoV-2  (COVID19) nucleic acid. Viral nucleic acids may persist in vivo,  independent of viability. Detection of viral nucleic acid does  not imply the presence of infectious virus, or that virus  nucleic acid is the cause of clinical symptoms. Negative  results should be treated as presumptive and, if inconsistent  with clinical signs and symptoms or necessary for patient  management, should be tested with an alternative molecular  assay. Negative results do not preclude SARS-CoV-2 infection  and should not be used as the sole basis for patient  management decisions. Invalid results may be due to inhibiting  substances in the specimen and recollection should occur.         Narrative:      o Collect and clearly label specimen type:  o Upper respiratory specimen: One Nasopharyngeal Dry Swab NO  Transport Media.  o Hand deliver to laboratory ASAP  Indication for testing->Extended care facility admission to  semi private room           Xr Chest  Ap Portable    Result Date: 11/24/2019  No acute process. Delaney Meigs, MD  11/24/2019 11:18 PM        Echo Results  None          RADIOLOGY     Upon my review:     XR Chest AP Portable [[id number]] Collected: 11/24/19 2317    Order Status: Completed Updated: 11/24/19 2320    Narrative:     HISTORY:Chest Pain.     COMPARISON: 07/01/2019     TECHNIQUE: XR CHEST AP PORTABLE     FINDINGS:     Lines and tubes: None     Lungs and hila: The lungs are clear. There is no pleural effusion. There   is no pneumothorax.     Heart and Mediastinum:  Normal     Bones and soft tissues: Degenerative changes are present in the spine.     Upper abdomen: Normal     Impression:       No acute process.          Signed,  Forest Becker  5:36 PM 11/27/2019

## 2019-11-27 NOTE — Plan of Care (Signed)
The patient is alert and oriented 4. Moderate fall risk secondary to meds. educated on fall prevention and safety interventions. Patient stated " I will not fall". Pt is ediculated on pain meds and BP meds effect on BP and risk of dizziness and drowsiness. Verbalized understanding. Pt is using BSC. Fall prevention interventions.  Back pain is managed with Dilaudid. No s/sx of sedation.   Crackles to left basilar. Non pitting edema to BLE.  Oxy sat 92-95% on room air while awake. Denied SOB. Oxy sat increase to 95% with deep breathing. Noted patient oxy sat 87-90% while asleep. Placed 2 lit oxygen via nasal canulla. Patient oxy sat increased to 95%on 2lit. encouraged to use IS. Will continue monitoring     Problem: Safety  Goal: Patient will be free from injury during hospitalization  Outcome: Progressing  Flowsheets (Taken 11/27/2019 0059)  Patient will be free from injury during hospitalization:   Assess patient's risk for falls and implement fall prevention plan of care per policy   Use appropriate transfer methods   Ensure appropriate safety devices are available at the bedside   Provide and maintain safe environment   Include patient/ family/ care giver in decisions related to safety   Hourly rounding     Problem: Everyday - Heart Failure  Goal: Stable Vital Signs and Fluid Balance  Outcome: Progressing  Flowsheets (Taken 11/27/2019 0059)  Stable Vital Signs and Fluid Balance:   Daily Standing Weights in the morning using the same scale, after using the bathroom and before breadfast.  If unable to stand, zero the bed and use the bed scale   Monitor, assess vital signs and telemetry per policy   Fluid Restriction   Assess for swelling/edema   Monitor labs and report abnormalities to physician   Wean oxygen as needed if appropriate   Strict Intake/Output     Problem: Compromised Hemodynamic Status  Goal: Vital signs and fluid balance maintained/improved  Outcome: Progressing  Flowsheets (Taken 11/27/2019  0059)  Vital signs and fluid balance are maintained/improved:   Position patient for maximum circulation/cardiac output   Monitor/assess lab values and report abnormal values   Monitor and compare daily weight   Monitor intake and output. Notify LIP if urine output is less than 30 mL/hour.   Monitor/assess vitals and hemodynamic parameters with position changes

## 2019-11-27 NOTE — OT Progress Note (Signed)
Occupational Therapy Note    [facility]  Occupational Therapy Treatment     Patient: [name]     MRN#: [id number]   Unit: [address]  Bed: A2319/A2319.A    Time of treatment:  Time Calculation  OT Received On: 11/27/19  Start Time: 1600  Stop Time: 2449  Time Calculation (min): 43 min             Chart Review and Collaboration with Care Team: 5 minutes, not included in above time.    OT Visit Number: 2    Precautions and Contraindications:    Precautions  Other Precautions: Falls, Obese     Personal Protective Equipment (PPE)  gloves, procedure mask, eye shield/covering, surgical / bouffant cap and shoe covers    Updated Labs:  Lab Results   Component Value Date/Time    HGB 12.5 11/27/2019 04:36 AM    HCT 39.9 11/27/2019 04:36 AM    K 4.2 11/27/2019 04:36 AM    NA 138 11/27/2019 04:36 AM    INR 0.9 01/31/2019 10:42 PM    TROPI <0.01 11/25/2019 02:44 AM    TROPI <0.01 06/04/2019 11:42 PM    TROPI <0.01 01/31/2019 10:42 PM    TROPI <0.01 01/02/2019 08:39 AM       All imaging reviewed, please see chart for details.    Subjective:    .  "It's so hard to do my socks, I get so out of breath."       Patient's medical condition is appropriate for Occupational Therapy intervention at this time.  Patient is agreeable to participation in the therapy session. Nursing clears patient for therapy.  Pain Assessment  Pain Assessment: Numeric Scale (0-10)  Pain Score: 8-severe pain  Pain Location: Leg;Back  Pain Orientation: Other (Comment)(B/l legs, low back)  Effect of Pain on Daily Activities: mild  Pain Intervention(s): Medication (See eMAR);Emotional support      Objective:  Observation of Patient/Vital Signs:    VSS    Patient received in bed with telemetry  in place.    Cognition/Neuro Status  Following Commands: Follows all commands and directions without difficulty  Safety Awareness: minimal verbal instruction  Problem Solving: supervision  Behavior:  attentive;calm;cooperative    Functional Mobility  Supine to Sit Transfers: Modified Independent;grab bars  Sit to Supine Transfers: Modified Independent;grab bars  Functional Mobility/Ambulation: Contact Guard Assist(With RW)    Self-care and Home Management  Grooming: Stand by Assist;standing at sink  LB Dressing: Contact Guard Assist;Use of adaptive equipment  Toileting: Armed forces training and education officer Transfers: Nurse, learning disability                                    Educated the patient to role of occupational therapy, plan of care, goals of therapy and safety with mobility and ADLs, energy conservation techniques.     Patient left in bed with call bell and all personal items/needs within reach. RN notified of session outcome.       Assessment:  Pt trained on energy conservation techniques, use of AE/modified DME to improve ease in performing ADLs including tub bench for shower transfers, BSC, and long handled AE for LB ADLs.  Pt very receptive to all education provided, asking questions t/o with excellent carryover demonstrated on trained skills for LB dressing tasks tub transfer, toileting routine.  Handout provided on ECT and equipment recommendations.  Pt able to complete all standing ADLs with CGA.  Modification of d/c recommendation to home with supervision and HHOT/PT.    OT frequency is being decreased to 1-2x/week due to change in discharge destination requiring corresponding frequency update.      PMP Activity: Step 6 - Walks in Room  Distance Walked (ft) (Step 6,7): 20 Feet      Plan:  Goal Formulation: Patient  OT Plan  Risks/Benefits/POC Discussed with Pt/Family: With patient  Treatment Interventions: ADL retraining, Functional transfer training, Endurance training, Patient/Family training, Equipment eval/education, Compensatory technique education  Discharge Recommendation: Home with supervision, Home with home health OT, Home with home health PT  DME Recommended for Discharge: Grab bars, Tub  transfer bench, BSC, Front wheel walker, Reacher, Long-handled sponge, Sock aid, Long-handled shoehorn, Other (Comment)(Bariatric tub bench, BSC, sock aide needed; bed enabler)  OT Frequency Recommended: 1-2x/wk  OT - Next Visit Recommendation: 12/01/19    Time For Goal Achievement: by time of discharge  ADL Goals  Patient will dress lower body: Minimal Assist, by time of discharge, Goal met, New goal, Modified Independent, with AE(CGA with AE)  Pt will complete bathing: Minimal Assist, by time of discharge, Not met  Patient will toilet: Minimal Assist, by time of discharge, Partly met(CGA)  Mobility and Transfer Goals  Pt will perform functional transfers: Minimal Assist, by time of discharge, Goal met, New goal, Modified Independent(CGA)  Pt will perform shower transfer: Minimal Assist, by time of discharge, Partly met                       Continue plan of care.       DME Recommended for Discharge: Grab bars, Tub transfer bench, BSC, Front wheel walker, Reacher, Long-handled sponge, Sock aid, Long-handled shoehorn, Other (Comment)(Bariatric tub bench, BSC, sock aide needed; bed enabler)  Discharge Recommendation: Home with supervision, Home with home health OT, Home with home health PT      [place], [place]  [phone]    11/27/2019 5:08 PM

## 2019-11-27 NOTE — Plan of Care (Signed)
Discharge Recommendation: Home with supervision, Home with home health OT, Home with home health PT   DME Recommended for Discharge: Grab bars, Tub transfer bench, BSC, Front wheel walker, Reacher, Long-handled sponge, Sock aid, Long-handled shoehorn, Other (Comment)(Bariatric tub bench, BSC, sock aide needed; bed enabler)    OT Frequency Recommended: 1-2x/wk     Next Visit Recommendation: OT - Next Visit Recommendation: 12/01/19       Is PT evaluation indicated at this time? This patient is already on PT caseload.     PMP Activity: Step 6 - Walks in Room  Distance Walked (ft) (Step 6,7): 20 Feet  (Please See Therapy Evaluation for device and assistance level needed)    Treatment Interventions: ADL retraining, Functional transfer training, Endurance training, Patient/Family training, Equipment eval/education, Compensatory technique education       Goal Formulation: Patient  Time For Goal Achievement: by time of discharge  ADL Goals  Patient will dress lower body: Minimal Assist, by time of discharge, Goal met, New goal, Modified Independent, with AE(CGA with AE)  Pt will complete bathing: Minimal Assist, by time of discharge, Not met  Patient will toilet: Minimal Assist, by time of discharge, Partly met(CGA)  Mobility and Transfer Goals  Pt will perform functional transfers: Minimal Assist, by time of discharge, Goal met, New goal, Modified Independent(CGA)  Pt will perform shower transfer: Minimal Assist, by time of discharge, Partly met

## 2019-11-28 ENCOUNTER — Inpatient Hospital Stay: Payer: Medicaid Other

## 2019-11-28 LAB — CBC
Absolute NRBC: 0 10*3/uL (ref 0.00–0.00)
Hematocrit: 37.4 % (ref 34.7–43.7)
Hgb: 11.9 g/dL (ref 11.4–14.8)
MCH: 28.9 pg (ref 25.1–33.5)
MCHC: 31.8 g/dL (ref 31.5–35.8)
MCV: 90.8 fL (ref 78.0–96.0)
MPV: 10.5 fL (ref 8.9–12.5)
Nucleated RBC: 0 /100 WBC (ref 0.0–0.0)
Platelets: 244 10*3/uL (ref 142–346)
RBC: 4.12 10*6/uL (ref 3.90–5.10)
RDW: 14 % (ref 11–15)
WBC: 8.59 10*3/uL (ref 3.10–9.50)

## 2019-11-28 LAB — HEPATIC FUNCTION PANEL
ALT: 9 U/L (ref 0–55)
AST (SGOT): 9 U/L (ref 5–34)
Albumin/Globulin Ratio: 1.3 (ref 0.9–2.2)
Albumin: 3 g/dL — ABNORMAL LOW (ref 3.5–5.0)
Alkaline Phosphatase: 81 U/L (ref 37–106)
Bilirubin Direct: 0.1 mg/dL (ref 0.0–0.5)
Bilirubin Indirect: 0.1 mg/dL — ABNORMAL LOW (ref 0.2–1.0)
Bilirubin, Total: 0.2 mg/dL (ref 0.2–1.2)
Globulin: 2.3 g/dL (ref 2.0–3.6)
Protein, Total: 5.3 g/dL — ABNORMAL LOW (ref 6.0–8.3)

## 2019-11-28 LAB — RENAL FUNCTION PANEL
Anion Gap: 9 (ref 5.0–15.0)
BUN: 16 mg/dL (ref 7.0–19.0)
CO2: 25 mEq/L (ref 22–29)
Calcium: 9.1 mg/dL (ref 8.5–10.5)
Chloride: 100 mEq/L (ref 100–111)
Creatinine: 0.8 mg/dL (ref 0.6–1.0)
Glucose: 125 mg/dL — ABNORMAL HIGH (ref 70–100)
Phosphorus: 3.8 mg/dL (ref 2.3–4.7)
Potassium: 3.7 mEq/L (ref 3.5–5.1)
Sodium: 134 mEq/L — ABNORMAL LOW (ref 136–145)

## 2019-11-28 LAB — MAGNESIUM: Magnesium: 2.1 mg/dL (ref 1.6–2.6)

## 2019-11-28 LAB — HEMOLYSIS INDEX: Hemolysis Index: 15 (ref 0–18)

## 2019-11-28 LAB — GFR: EGFR: >60

## 2019-11-28 MED ORDER — FUROSEMIDE 10 MG/ML IJ SOLN
20.00 mg | Freq: Two times a day (BID) | INTRAMUSCULAR | Status: DC
Start: 2019-11-28 — End: 2019-11-28

## 2019-11-28 NOTE — UM Notes (Signed)
Acute on chronic heart failure with preserved ejection fraction, resolved  Lower extremity neuropathy, severe, requiring IV opioid therapy  HTN  COPD, without exacerbation  Active tobacco smoking  Mood mood disorder: Anxiety with depression  Morbid obesity  Possible sleep apnea  History of coronary artery disease  GERD  Asymptomatic hypotension      Continues to require IV opioids for pain on ambulation   Check MRI L-spine  Start PO lasix 40 mg BID tomorrow  Possible component of obesity hypoventilation syndrome  PFTs as outpatient  Continue duo nebs  Continue Breo  Continue acetaminophen 3 times daily  Continue indomethacin  Continue Ditropan  Continue Risperdal  Continue Zoloft  Increase gabapentin for neuropathy  Unable to start SNRI as patient on zoloft      1/8-  Breathing improved from initial hospitalization, however, continues to have dyspnea, from walking to the bathroom and back.  No prior diagnosis of OSA.  Last did PFTs 5-6 years ago.  Reports severe pain of the low back, with radiation towards the legs b/l to the feet.  Causes difficulty in ambulation because of the pain.  Last used IV opioid earlier this morning      Current Facility-Administered Medications   Medication Dose Route Frequency    acetaminophen  1,000 mg Oral TID    albuterol-ipratropium  3 mL Nebulization Q6H [place]    enoxaparin  40 mg Subcutaneous Daily    fluticasone furoate-vilanterol  1 puff Inhalation QAM    gabapentin  800 mg Oral TID    indomethacin  50 mg Oral TID MEALS    nicotine  1 patch Transdermal Daily    oxybutynin  10 mg Oral TID    risperiDONE  3 mg Oral BID    sertraline  100 mg Oral BID      11/28/19  0438 11/27/19  0436 11/26/19  0523 11/25/19  0244   Sodium 134* 138 140 141   Potassium 3.7 4.2 4.2 4.3   Chloride 100 104 109 109   CO2 _0 21*   BUN 16.0 17.0 21.0* 18.0   Creatinine 0.8 0.9 0.7 0.7   Glucose 125* 123* 98 107*      11/28/19  0438 11/27/19  0436 11/26/19  0523   ALT _1 AST (SGOT) _2 Bilirubin, Total 0.2 0.2 0.3   Bilirubin Direct 0.1 0.1 0.1   Albumin 3.0* 3.2* 3.0*       11/28/19 16:43:21  97.9 F (36.6 C)  --  67  18  82/44Abnormal   93 %  3 L/min

## 2019-11-28 NOTE — Discharge Instr - Diet (Addendum)
Cardiac diet with 2 GM sodium restriction

## 2019-11-28 NOTE — Discharge Instr - Activity (Signed)
As tolerated

## 2019-11-28 NOTE — Plan of Care (Incomplete)
-  The patient is alert and oriented x4. independent ADLs. With minimal assist. Educated on fall  Prevention and risk secondary to medications. Stated understanding. moderate fall risk intervention are maintained.     -Pt lung sounds bilaterally diminished and basilar crackles. No SOB/distress. Tolerated activity in the room. Noted oxygen sat. tends to drop to 80% on room air sleeping. Pt breathing regular deep breath while sleeping. RR 16. No accessories muscle use. Placed patient on 2 lit. oxygen via nasal canula.   Saturation promptly  increased to 96-97% sleeping. encouraged patient to follow recommendation to use home CPAP. Pt on neb. treatment.     -c/o pain to legs and lower back. Chronic pain. Pain rated 9/10 post self care: bathing  and toilet use.  Given hydromorphone. No sedation.  Stable vital signs. NSR per telemetry. Hr 80's. Will Continue monitoring.     Problem: Safety  Goal: Patient will be free from injury during hospitalization  Outcome: Progressing  Flowsheets (Taken 11/28/2019 0023)  Patient will be free from injury during hospitalization:   Assess patient's risk for falls and implement fall prevention plan of care per policy   Use appropriate transfer methods   Ensure appropriate safety devices are available at the bedside   Include patient/ family/ care giver in decisions related to safety   Hourly rounding   Provide and maintain safe environment     Problem: Pain  Goal: Pain at adequate level as identified by patient  Outcome: Progressing  Flowsheets (Taken 11/28/2019 0023)  Pain at adequate level as identified by patient:   Identify patient comfort function goal   Assess pain on admission, during daily assessment and/or before any "as needed" intervention(s)   Assess for risk of opioid induced respiratory depression, including snoring/sleep apnea. Alert healthcare team of risk factors identified.   Reassess pain within 30-60 minutes of any procedure/intervention, per Pain Assessment,  Intervention, Reassessment (AIR) Cycle   Evaluate if patient comfort function goal is met   Offer non-pharmacological pain management interventions   Evaluate patient's satisfaction with pain management progress     Problem: Everyday - Heart Failure  Goal: Stable Vital Signs and Fluid Balance  Outcome: Progressing  Flowsheets (Taken 11/27/2019 0059)  Stable Vital Signs and Fluid Balance:   Daily Standing Weights in the morning using the same scale, after using the bathroom and before breadfast.  If unable to stand, zero the bed and use the bed scale   Monitor, assess vital signs and telemetry per policy   Fluid Restriction   Assess for swelling/edema   Monitor labs and report abnormalities to physician   Wean oxygen as needed if appropriate   Strict Intake/Output

## 2019-11-28 NOTE — Plan of Care (Addendum)
Patient is alert and oriented, pain management in place, Neb treatment as ordered. Pt states excruciating back pain and MRI ordered. Pt stated that she wants her liver and kidney check because of the various medications she has been taking, Dr notified.. Will continue to monitor.       Problem: Safety  Goal: Patient will be free from injury during hospitalization  Outcome: Progressing  Flowsheets (Taken 11/28/2019 0023 by Shelda Altes, RN)  Patient will be free from injury during hospitalization:   Assess patient's risk for falls and implement fall prevention plan of care per policy   Use appropriate transfer methods   Ensure appropriate safety devices are available at the bedside   Include patient/ family/ care giver in decisions related to safety   Hourly rounding   Provide and maintain safe environment     Problem: Pain  Goal: Pain at adequate level as identified by patient  Outcome: Progressing  Flowsheets (Taken 11/28/2019 0023 by Shelda Altes, RN)  Pain at adequate level as identified by patient:   Identify patient comfort function goal   Assess pain on admission, during daily assessment and/or before any "as needed" intervention(s)   Assess for risk of opioid induced respiratory depression, including snoring/sleep apnea. Alert healthcare team of risk factors identified.   Reassess pain within 30-60 minutes of any procedure/intervention, per Pain Assessment, Intervention, Reassessment (AIR) Cycle   Evaluate if patient comfort function goal is met   Offer non-pharmacological pain management interventions   Evaluate patient's satisfaction with pain management progress     Problem: Inadequate Gas Exchange  Goal: Adequate oxygenation and improved ventilation  Outcome: Progressing  Flowsheets (Taken 11/26/2019 1910 by Laural Golden, RN)  Adequate oxygenation and improved ventilation:   Assess lung sounds   Monitor SpO2 and treat as needed   Position for maximum ventilatory efficiency   Increase  activity as tolerated/progressive mobility   Plan activities to conserve energy: plan rest periods

## 2019-11-28 NOTE — Progress Notes (Signed)
[facility]  PROGRESS NOTE      Patient: [name]  Date: 11/28/2019   LOS: 2 Days  Admission Date: 11/25/2019   MRN: [id number]  Attending: Forest Becker  Please contact me on the following Epic Chat Xtended paging       ASSESSMENT/PLAN     [name] is a 49 y.o. female admitted with Chest tightness    Interval Summary:     Active Hospital Problems    Diagnosis    Chest tightness     Acute on chronic heart failure with preserved ejection fraction, resolved  Lower extremity neuropathy, severe, requiring IV opioid therapy  HTN  COPD, without exacerbation  Active tobacco smoking  Mood mood disorder: Anxiety with depression  Morbid obesity  Possible sleep apnea  History of coronary artery disease  GERD  Asymptomatic hypotension      Continues to require IV opioids for pain on ambulation   Check MRI L-spine  Start PO lasix 40 mg BID tomorrow  Possible component of obesity hypoventilation syndrome  PFTs as outpatient  Continue duo nebs  Continue Breo  Continue acetaminophen 3 times daily  Continue indomethacin  Continue Ditropan  Continue Risperdal  Continue Zoloft  Increase gabapentin for neuropathy  Unable to start SNRI as patient on zoloft        Analgesia: APAP    Nutrition: Sodium restricted diet    DVT Prophylaxis:Lovenox       Code Status: FULL CODE    DISPO: Continued inaptient     Family Contact:        SUBJECTIVE     [name] states she is feeling better    1/7-continues to have lower extremity pain.  Improving edema but reports continued swelling of the left leg and right thigh.  Breathing okay.    1/8-  Breathing improved from initial hospitalization, however, continues to have dyspnea, from walking to the bathroom and back.  No prior diagnosis of OSA.  Last did PFTs 5-6 years ago.  Reports severe pain of the low back, with radiation towards the legs b/l to the feet.  Causes difficulty in ambulation because of the pain.  Last used IV opioid earlier this morning    MEDICATIONS      Current Facility-Administered Medications   Medication Dose Route Frequency    acetaminophen  1,000 mg Oral TID    albuterol-ipratropium  3 mL Nebulization Q6H [facility]    enoxaparin  40 mg Subcutaneous Daily    fluticasone furoate-vilanterol  1 puff Inhalation QAM    gabapentin  800 mg Oral TID    indomethacin  50 mg Oral TID MEALS    nicotine  1 patch Transdermal Daily    oxybutynin  10 mg Oral TID    risperiDONE  3 mg Oral BID    sertraline  100 mg Oral BID       PHYSICAL EXAM     Vitals:    11/28/19 1137   BP: 91/61   Pulse: 73   Resp:    Temp:    SpO2:        Temperature: Temp  Min: 97 F (36.1 C)  Max: 98.8 F (37.1 C)  Pulse: Pulse  Min: 73  Max: 84  Respiratory: Resp  Min: 16  Max: 18  Non-Invasive BP: BP  Min: 84/48  Max: 116/74  Pulse Oximetry SpO2  Min: 94 %  Max: 97 %    Intake and Output Summary (Last  24 hours) at Date Time    Intake/Output Summary (Last 24 hours) at 11/28/2019 1430  Last data filed at 11/28/2019 1013  Gross per 24 hour   Intake 1280 ml   Output 1000 ml   Net 280 ml         GEN APPEARANCE: Normal;  A&OX3  HEENT: PERLA; EOMI; Conjunctiva Clear  NECK: Supple; No bruits  CVS: RRR, S1, S2; No M/G/R  LUNGS: CTAB; No Wheezes; No Rhonchi: No rales  ABD: Soft; No TTP; + Normoactive BS  EXT: Nonpitting edema of the legs bilaterally  Skin exam:  pink  NEURO: CN 2-12 intact; No Focal neurological deficits  CAP REFILL:  Normal  MENTAL STATUS:  Normal    Exam done by Forest Becker, MD on 11/28/19 at 7:33 PM      LABS     Recent Labs   Lab 11/28/19  0438 11/27/19  0436 11/26/19  0523   WBC 8.59 9.61* 8.67   RBC 4.12 4.33 4.34   Hgb 11.9 12.5 12.5   Hematocrit 37.4 39.9 39.5   MCV 90.8 92.1 91.0   Platelets 244 279 263       Recent Labs   Lab 11/28/19  0438 11/27/19  0436 11/26/19  0523 11/25/19  0244   Sodium 134* 138 140 141   Potassium 3.7 4.2 4.2 4.3   Chloride 100 104 109 109   CO2 _0 21*   BUN 16.0 17.0 21.0* 18.0   Creatinine 0.8 0.9 0.7 0.7   Glucose 125* 123* 98 107*   Calcium  9.1 9.3 9.3 9.9   Magnesium 2.1 2.1 2.2  --        Recent Labs   Lab 11/28/19  0438 11/27/19  0436 11/26/19  0523   ALT _1 AST (SGOT) _2 Bilirubin, Total 0.2 0.2 0.3   Bilirubin Direct 0.1 0.1 0.1   Albumin 3.0* 3.2* 3.0*   Alkaline Phosphatase 81 88 78       Recent Labs   Lab 11/25/19  0244   Troponin I <0.01             Microbiology Results     Procedure Component Value Units Date/Time    COVID-19 (SARS-COV-2) [facility] Rapid) [[id number]] Collected: 11/25/19 0742    Specimen: Nasopharyngeal Swab from Nasopharynx Updated: 11/25/19 0810     Purpose of COVID testing Screening     SARS-CoV-2 Specimen Source Nasopharyngeal     SARS CoV 2 Overall Result Negative     Comment: Test performed using the Abbott ID NOW EUA assay.  Please see Fact Sheets for patients and providers located at:  http://olson-hall.info/  This test is for the qualitative detection of SARS-CoV-2  (COVID19) nucleic acid. Viral nucleic acids may persist in vivo,  independent of viability. Detection of viral nucleic acid does  not imply the presence of infectious virus, or that virus  nucleic acid is the cause of clinical symptoms. Negative  results should be treated as presumptive and, if inconsistent  with clinical signs and symptoms or necessary for patient  management, should be tested with an alternative molecular  assay. Negative results do not preclude SARS-CoV-2 infection  and should not be used as the sole basis for patient  management decisions. Invalid results may be due to inhibiting  substances in the specimen and recollection should occur.         Narrative:  o Collect and clearly label specimen type:  o Upper respiratory specimen: One Nasopharyngeal Dry Swab NO  Transport Media.  o Hand deliver to laboratory ASAP  Indication for testing->Extended care facility admission to  semi private room           Xr Chest  Ap Portable    Result Date: 11/24/2019  No acute process. Delaney Meigs, MD  11/24/2019 11:18 PM        Echo  Results     None          RADIOLOGY     Upon my review:     XR Chest AP Portable [[id number]] Collected: 11/24/19 2317    Order Status: Completed Updated: 11/24/19 2320    Narrative:     HISTORY:Chest Pain.     COMPARISON: 07/01/2019     TECHNIQUE: XR CHEST AP PORTABLE     FINDINGS:     Lines and tubes: None     Lungs and hila: The lungs are clear. There is no pleural effusion. There   is no pneumothorax.     Heart and Mediastinum: Normal     Bones and soft tissues: Degenerative changes are present in the spine.     Upper abdomen: Normal     Impression:       No acute process.          Signed,  Forest Becker  2:30 PM 11/28/2019

## 2019-11-29 LAB — HEPATIC FUNCTION PANEL
ALT: 13 U/L (ref 0–55)
AST (SGOT): 13 U/L (ref 5–34)
Albumin/Globulin Ratio: 1.2 (ref 0.9–2.2)
Albumin: 2.7 g/dL — ABNORMAL LOW (ref 3.5–5.0)
Alkaline Phosphatase: 73 U/L (ref 37–106)
Bilirubin Direct: 0.1 mg/dL (ref 0.0–0.5)
Bilirubin Indirect: 0.3 mg/dL (ref 0.2–1.0)
Bilirubin, Total: 0.4 mg/dL (ref 0.2–1.2)
Globulin: 2.3 g/dL (ref 2.0–3.6)
Protein, Total: 5 g/dL — ABNORMAL LOW (ref 6.0–8.3)

## 2019-11-29 LAB — CBC
Absolute NRBC: 0 10*3/uL (ref 0.00–0.00)
Hematocrit: 35.1 % (ref 34.7–43.7)
Hgb: 11.2 g/dL — ABNORMAL LOW (ref 11.4–14.8)
MCH: 28.7 pg (ref 25.1–33.5)
MCHC: 31.9 g/dL (ref 31.5–35.8)
MCV: 90 fL (ref 78.0–96.0)
MPV: 10.4 fL (ref 8.9–12.5)
Nucleated RBC: 0 /100 WBC (ref 0.0–0.0)
Platelets: 226 10*3/uL (ref 142–346)
RBC: 3.9 10*6/uL (ref 3.90–5.10)
RDW: 13 % (ref 11–15)
WBC: 6.55 10*3/uL (ref 3.10–9.50)

## 2019-11-29 LAB — RENAL FUNCTION PANEL
Anion Gap: 7 (ref 5.0–15.0)
BUN: 17 mg/dL (ref 7.0–19.0)
CO2: 27 mEq/L (ref 22–29)
Calcium: 8.8 mg/dL (ref 8.5–10.5)
Chloride: 97 mEq/L — ABNORMAL LOW (ref 100–111)
Creatinine: 0.8 mg/dL (ref 0.6–1.0)
Glucose: 115 mg/dL — ABNORMAL HIGH (ref 70–100)
Phosphorus: 3.5 mg/dL (ref 2.3–4.7)
Potassium: 3.8 mEq/L (ref 3.5–5.1)
Sodium: 131 mEq/L — ABNORMAL LOW (ref 136–145)

## 2019-11-29 LAB — GFR: EGFR: >60

## 2019-11-29 LAB — MAGNESIUM: Magnesium: 1.9 mg/dL (ref 1.6–2.6)

## 2019-11-29 LAB — HEMOLYSIS INDEX: Hemolysis Index: 13 (ref 0–18)

## 2019-11-29 MED ORDER — ALBUTEROL-IPRATROPIUM 2.5-0.5 (3) MG/3ML IN SOLN
3.00 mL | Freq: Four times a day (QID) | RESPIRATORY_TRACT | 0 refills | Status: DC | PRN
Start: 2019-11-29 — End: 2020-10-09

## 2019-11-29 MED ORDER — OXYCODONE-ACETAMINOPHEN 10-325 MG PO TABS
1.0000 | ORAL_TABLET | ORAL | 0 refills | Status: DC | PRN
Start: 2019-11-29 — End: 2020-10-09

## 2019-11-29 MED ORDER — FUROSEMIDE 20 MG PO TABS
20.0000 mg | ORAL_TABLET | Freq: Two times a day (BID) | ORAL | 1 refills | Status: DC
Start: 2019-11-29 — End: 2021-03-23

## 2019-11-29 MED ORDER — OXYBUTYNIN CHLORIDE 5 MG PO TABS
5.0000 mg | ORAL_TABLET | Freq: Three times a day (TID) | ORAL | 1 refills | Status: DC
Start: 2019-11-29 — End: 2021-03-31

## 2019-11-29 NOTE — Plan of Care (Signed)
Problem: Safety  Goal: Patient will be free from injury during hospitalization  Outcome: Progressing  Flowsheets (Taken 11/29/2019 1523)  Patient will be free from injury during hospitalization:   Include patient/ family/ care giver in decisions related to safety   Assess patient's risk for falls and implement fall prevention plan of care per policy   Use appropriate transfer methods   Assess for patients risk for elopement and implement Elopement Risk Plan per policy   Hourly rounding   Ensure appropriate safety devices are available at the bedside  Goal: Patient will be free from infection during hospitalization  Outcome: Progressing  Flowsheets (Taken 11/29/2019 1523)  Free from Infection during hospitalization:   Assess and monitor for signs and symptoms of infection   Monitor lab/diagnostic results   Monitor all insertion sites (i.e. indwelling lines, tubes, urinary catheters, and drains)   Encourage patient and family to use good hand hygiene technique     Problem: Pain  Goal: Pain at adequate level as identified by patient  Outcome: Progressing  Flowsheets (Taken 11/29/2019 1523)  Pain at adequate level as identified by patient:   Identify patient comfort function goal   Assess for risk of opioid induced respiratory depression, including snoring/sleep apnea. Alert healthcare team of risk factors identified.   Assess pain on admission, during daily assessment and/or before any "as needed" intervention(s)   Reassess pain within 30-60 minutes of any procedure/intervention, per Pain Assessment, Intervention, Reassessment (AIR) Cycle   Evaluate if patient comfort function goal is met   Evaluate patient's satisfaction with pain management progress   Consult/collaborate with Physical Therapy, Occupational Therapy, and/or Speech Therapy     Problem: Discharge Barriers  Goal: Patient will be discharged home or other facility with appropriate resources  Outcome: Progressing  Flowsheets (Taken 11/29/2019 1523)  Discharge to  home or other facility with appropriate resources:   Provide appropriate patient education   Provide information on available health resources   Initiate discharge planning     Problem: Psychosocial and Spiritual Needs  Goal: Demonstrates ability to cope with hospitalization/illness  Outcome: Progressing  Flowsheets (Taken 11/29/2019 1523)  Demonstrates ability to cope with hospitalizations/illness:   Encourage verbalization of feelings/concerns/expectations   Provide quiet environment   Assist patient to identify own strengths and abilities   Encourage patient to set small goals for self   Encourage participation in diversional activity   Reinforce positive adaptation of new coping behaviors   Include patient/ patient care companion in decisions     Problem: Compromised Hemodynamic Status  Goal: Vital signs and fluid balance maintained/improved  Outcome: Progressing  Flowsheets (Taken 11/29/2019 1523)  Vital signs and fluid balance are maintained/improved:   Position patient for maximum circulation/cardiac output   Monitor/assess vitals and hemodynamic parameters with position changes   Monitor and compare daily weight   Monitor intake and output. Notify LIP if urine output is less than 30 mL/hour.   Monitor/assess lab values and report abnormal values     Problem: Inadequate Gas Exchange  Goal: Adequate oxygenation and improved ventilation  Outcome: Progressing  Flowsheets (Taken 11/29/2019 1523)  Adequate oxygenation and improved ventilation:   Assess lung sounds   Monitor SpO2 and treat as needed   Provide mechanical and oxygen support to facilitate gas exchange   Position for maximum ventilatory efficiency   Teach/reinforce use of incentive spirometer 10 times per hour while awake, cough and deep breath as needed   Plan activities to conserve energy: plan rest periods  Increase activity as tolerated/progressive mobility         Patient is alert and oriented x4. Patient denies chest pain and shortness of breath.   Patient is ambulatory to the bathroom. No BM noted during the shift. Patient's safety was maintained during the shift. Patient continues  on tele. Patient vital signs and lab work were monitored and communicated to Doctor. Doctor responded and explained plan of care with patient at bed side. Patient was educated on all her medications and precautions to be taken. No distress noted during the shift.

## 2019-11-29 NOTE — Progress Notes (Signed)
Pt discharged home today on stable condition. RN initiated discharge teaching with pt. at bedside. Tele and IV discontinued.  Pt understands activity, diet, medication regimen (pt received prescription, and written and verbal education), and md follow up appointment. Pt belongings with patient. Transport personal escorted pt out off the unit. Pt left without complication.

## 2019-11-29 NOTE — Progress Notes (Signed)
Patient discharge done, waiting for transport. RN called transport department, [facility] [place] [facility] on phone # [phone], She took RN phone number and stated she is going to give number to driver and tell driver to come to patient's room for patient.

## 2019-11-29 NOTE — Progress Notes (Signed)
Pt is AOX4, able to verbalize her needs needs. Pt  on RA, no respiratory distress noted, Lung sounds CTA. Tele monitoring in place, noted with NSR on the monitor. Pt medicated with Dilaudid and Oxycodone as ordered for back and leg pain.  Pt noted with low BP running in 80s and 90s. Educated about need to hold pain medicated, due to safety concern. Continue with routine pain management.    Nebs administered and well tolerated. Purposeful rounding done. Fall bundle in place. Call bell within reach, encouraged to call for assistance as needed.  MRI of L/S spine done this shift.

## 2019-11-29 NOTE — Progress Notes (Signed)
CM spoke with patient about SNF v. HH recommendations. Per patient, she would like to go home w/ [facility] services. [place] notified. Patient also states she will need transportation. CM to follow up.              Heide Scales, MSN, RN, [facility]  Case Manager [facility]  [phone]

## 2019-11-29 NOTE — Discharge Instr - AVS First Page (Addendum)
Reason for your Hospital Admission:  Heart failure       Instructions for after your discharge:  Continue taking lasix 20 mg twice a day   Continue breathing treatments  Please follow up with cardiology and pulmonology for further management of shortness of breath  Follow up with chronic pain management or PCP for ongoing chronic back pain

## 2019-11-29 NOTE — Progress Notes (Addendum)
Patient discharging home with H&M transport, confirmed for 5:30 PM. Paperwork faxed to [facility] [phone]). RN and patient notified of transportation time. Patient states fiance and mother-in-law can provide assistance at home. Number for H&M transport provided to RN [phone]).         11/29/19 1601   Discharge Disposition   Patient preference/choice provided? Yes   Physical Discharge Disposition Home   Mode of Transportation Other (comment)  (H&M transport)   Patient/Family/POA notified of transfer plan Yes   Patient agreeable to discharge plan/expected d/c date? Yes   Family/POA agreeable to discharge plan/expected d/c date?   (D/C plan discussed with patient)   Bedside nurse notified of transport plan? Yes   CM Interventions   Follow up appointment scheduled? No   Reason no follow up scheduled? Family to schedule   Referral made for home health RN visit? Patient declined   Multidisciplinary rounds/family meeting before d/c? Yes   Medicare Checklist   Is this a Medicare patient? No   Patient received 1st IMM Letter? n/a     Heide Scales, MSN, RN, [facility]  Case Manager [facility]  [phone]

## 2019-11-29 NOTE — Discharge Summary (Signed)
Discharge Summary    Date:11/29/2019   Patient Name: [name]  Attending Physician: Forest Becker, MD    Date of Admission:   11/25/2019    Date of Discharge:   11/29/2019    Admitting Diagnosis:   Acute on chronic heart failure with preserved EF  Possible obesity hypoventilation syndrome  Acute on chronic back pain  HTN  COPD without exacerbation  Active tobacco user  Mood disorder/anxiety/depression  Morbid obesity  History of CAD  GERD    Discharge Dx:     Principal Diagnosis (Diagnosis after study, that is chiefly responsible for admission to inpatient status): Chest tightness  Acute on chronic heart failure with preserved EF, improved  Possible obesity hypoventilation syndrom  Acute on chronic back pain  HTN  COPD without exacerbation  Active tobacco user  Mood disorder/anxiety/depression  Morbid obesity  History of CAD  GERD    Treatment Team:   Treatment Team:   Attending Provider: Forest Becker, MD  Consulting Physician: Marina Gravel, MD     Procedures performed:   Radiology: all results from this admission  Mri Lumbar Spine Wo Contrast    Result Date: 11/29/2019  Multilevel degenerative changes of the lumbar spine. There is facet arthropathy at all levels, worse at L4/L5. Grade 1 degenerative anterolisthesis of L4 on L5 vertebral bodies resulting in mild central canal stenosis. Spondylosis causing neural foramina narrowing particularly at L1/L2 on the right, L2/L3 on the right, L3/L4 on the left, L4/L5 on the left with impingement upon the exiting right L1, right L2, left L3, left L4 nerve roots. Sunday Corn, MD  11/29/2019 7:22 AM    Xr Chest  Ap Portable    Result Date: 11/24/2019  No acute process. Delaney Meigs, MD  11/24/2019 11:18 PM      Reason for Admission:   Acute on chronic heart failure with preserved EF  Possible obesity hypoventilation syndrome  Acute on chronic back pain  HTN  COPD without exacerbation  Active tobacco user  Mood disorder/anxiety/depression  Morbid obesity  History of  CAD  GERD  Hospital Course:     49 year old female p/w acute HF.  Diuresed.  Had clinical improvement.  Was not on daily lasix prior to hospitalization.  To continue lasix 20 mg twice a day and to follow up with cardiology.  Patient also likely with a component of obesity hypoventilation syndrome, as patient continued to have dyspnea with exertion.  Also contributing is patient's smoking along with COPD.  Discussed importance of seeing pulmonary as outpatient as well.  Patient also with chronic sciatica; underwent MRI showing degenerative joint disease.  Already on SSRI so was not able to start SNRI.  Already on gabapentin.  Discussed PT/OT, and to see chronic pain specialist as outpatient.  Counseled on smoking cessation.      Discharged in stable condition    Exam on discharge:    Gen:  NAD  Pulm:  CTAB  CV:  RRR, no r/m/g  Ext:  No edema    Condition at Discharge:   Stable  Today:     BP 99/65    Pulse 70    Temp 98.4 F (36.9 C) (Oral)    Resp 17    Ht 1.727 m (_0 )    Wt 157.5 kg (347 lb 3.2 oz)    SpO2 98%    BMI 52.79 kg/m   Ranges for the last 24 hours:  Temp:  [97.5 F (36.4 C)-98.4  F (36.9 C)] 98.4 F (36.9 C)  Heart Rate:  [61-73] 70  Resp Rate:  [16-18] 17  BP: (82-99)/(44-67) 99/65    Last set of labs       Micro / Labs / Path pending:     Unresulted Labs     None          Discharge Instructions For Providers     1. Follow up lasix dosing  2. Follow up with cardiology for HFpEF  3. Follow up with pulmonary for OSA/OHS  4. Follow up with chronic pain for chronic sciatica             Discharge Instructions:     Follow-up Information     Virgel Bouquet, MD .    Specialty: Family Medicine  Contact information:  [address]  [place] [address]  [phone]                   Discharge Diet: Sodium restricted diet        Disposition:  Home or Self Care     Discharge Medication List      Taking    albuterol sulfate HFA 108 (90 Base) MCG/ACT inhaler  Dose: 2 puff  Commonly known as:  PROVENTIL  Inhale 2 puffs into the lungs every 4 (four) hours as needed for Wheezing     albuterol-ipratropium 2.5-0.5(3) mg/3 mL nebulizer  Dose: 3 mL  Commonly known as: DUO-NEB  Take 3 mLs by nebulization every 6 (six) hours as needed (for SOB or Wheezing)     ALPRAZolam 0.5 MG tablet  Dose: 0.5 mg  Commonly known as: XANAX  Take 0.5 mg by mouth 2 (two) times daily as needed      atomoxetine 10 MG capsule  Dose: 80 mg  Commonly known as: STRATTERA  Take 80 mg by mouth daily      benzonatate 200 MG capsule  Dose: 200 mg  Commonly known as: TESSALON  Take 1 capsule (200 mg total) by mouth 3 (three) times daily as needed for Cough     cyclobenzaprine 10 MG tablet  Dose: 10 mg  Commonly known as: FLEXERIL  Take 1 tablet (10 mg total) by mouth 3 (three) times daily as needed for Muscle spasms     famotidine 20 MG tablet  Dose: 20 mg  Commonly known as: PEPCID  Take 1 tablet (20 mg total) by mouth every 12 (twelve) hours     fluticasone-salmeterol 500-50 MCG/DOSE Aepb  Dose: 1 puff  Commonly known as: ADVAIR DISKUS  Inhale 1 puff into the lungs 2 (two) times daily     furosemide 20 MG tablet  Dose: 20 mg  Commonly known as: LASIX  Take 1 tablet (20 mg total) by mouth 2 (two) times daily     gabapentin 800 MG tablet  Dose: 800 mg  Commonly known as: NEURONTIN  Take 800 mg by mouth 3 (three) times daily     guaiFENesin 600 MG 12 hr tablet  Dose: 1,200 mg  Commonly known as: MUCINEX  Take 2 tablets (1,200 mg total) by mouth 2 (two) times daily     indomethacin 50 MG capsule  Dose: 50 mg  Commonly known as: INDOCIN  Take 50 mg by mouth 3 (three) times daily with meals     lactobacillus/streptococcus Caps  Dose: 1 capsule  Take 1 capsule by mouth daily     naloxone 4 MG/0.1ML nasal spray  Commonly known as:  NARCAN  For: Opioid Overdose  1 spray intranasally. If pt does not respond or relapses into respiratory depression call 911. Give additional doses every 2-3 min.     ondansetron 4 MG disintegrating tablet  Dose: 4  mg  Commonly known as: ZOFRAN-ODT  Take 1 tablet (4 mg total) by mouth every 6 (six) hours as needed for Nausea     oxybutynin 5 MG tablet  Dose: 5 mg  Commonly known as: DITROPAN  Take 1 tablet (5 mg total) by mouth 3 (three) times daily     oxyCODONE-acetaminophen 10-325 MG per tablet  Dose: 1 tablet  Commonly known as: PERCOCET  Take 1 tablet by mouth every 4 (four) hours as needed for Pain     RisperDAL 3 MG tablet  Dose: 3 mg  Generic drug: risperiDONE  Take 3 mg by mouth 2 (two) times daily     Zoloft 100 MG tablet  Dose: 100 mg  Generic drug: sertraline  Take 100 mg by mouth 2 (two) times daily           Minutes spent coordinating discharge and reviewing discharge plan: 45 minutes      Signed by: Forest Becker, MD

## 2019-11-29 NOTE — Progress Notes (Signed)
S/w patient at bedside after consulted for HHPT/OT services, would also add SN recommendation d/t CHF and COPD.  Patient understands home health services and declines at this time.  She has help at home, would prefer to recover on her own with MD follow-up.  She has a rollator at home that she prefers to [facility] offered.  She would like BSC and Tub Transfer bench, also recommended.  Will need bariatric/heavy duty for both and explained that home delivery would be pending verification of benefits, may take 3-5 business days.  She is agreeable.  Orders submitted, will have tech f/u Monday 1/11 with faxing to [place] for [place] and home delivery.

## 2019-12-01 NOTE — Progress Notes (Signed)
[place]        Patient Name: [name], [name]     MRN: [id number]     CSN: [id number]       [place] #   [id number] Patient Class   Inpatient Service  Medicine Accommodation Code  Semi-Private     Admission Information    Admitting Physician:  Attending Physician: Forest Becker, MD   Unit  [address] L&D Status     Admitting Diagnosis: Shortness of breath; Chest tightness Room / Bed  [phone] L&D  Last Menstrual Cycle     Chief Complaint: Leg Swelling     Admit Type:  Admit Date/Time:  Discharge Date/Time: Emergency  11/25/2019 / 6144  11/29/2019 / 1753 Length of Stay: 3 Days   L&D EDD   Estimated Date of Delivery: None noted.     Patient Information            Home Address: [place]  [address] [place] [address] Employer:  Employer Address:     ,     Main Phone: [phone] Employer Phone:    SSN: [id number]     DOB: [birth year] (37 yrs)     Sex: Female Primary Care Physician: Virgel Bouquet, MD   Marital Status: Single Referring Physician:       No ref. provider found   Race: White or Caucasian     Ethnicity: Non Hispanic/Latino     Emergency Contacts  Name Home Phone Work Phone Mobile Phone Relationship Cecille Rubin   [place], [place] Mother         Guarantor Information    Guarantor Name: JOYANN, SPIDLE ID: [id number]   Guarantor Relationship to Pt: Self Guarantor Type: Personal/Family   Guarantor DOB:   1971-01-28     Guarantor Address: 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, drilling Name: [place] Name: [facility]   Insurance Address:   [place], [place] Subscriber DOB: May 04, 1971     Subscriber ID: [id number]    Insurance Phone: [phone] Pt Relationship to Sub:   Self   Insurance ID:      Group Name:  Preauthorization #: [id number]   Group #: [id number] Preauthorization Days:      Secondary Insurance    Insurance Name: - [place] Name:    Insurance underwriter Address:     ,   Veterinary surgeon DOB:      Subscriber ID:    Veterinary surgeon:  Pt Relationship to Sub:      Insurance ID:      Group Name:  Fish farm manager #:    Group #:  Preauthorization Days:      [facility] Name: Radiation protection practitioner Name:    Nutritional therapist:     ,   Veterinary surgeon DOB:      Subscriber ID:    Veterinary surgeon:  Pt Relationship to Sub:      Insurance ID:      Group Name:  Preauthorization #:    Group #:  Preauthorization Days:  12/01/2019 1:07 PM         Home Health face-to-face (FTF) Encounter (Order [id number])  Consult  Date: 11/29/2019 Department: [address] [place] Released By: Tamala Ser, RN (auto-released) Authorizing: Forest Becker, MD   Order Information    Order Date/Time Release Date/Time Start Date/Time End Date/Time   11/29/19 12:35 PM 11/29/19 12:35 PM 11/29/19 12:33 PM 11/29/19 12:33 PM   Order Details    Frequency Duration Priority Order Class   Once 1 occurrence Routine Hospital Performed   Provider Information    Ordering User Ordering Provider Authorizing Provider   Polzin, Logan, RN Forest Becker, MD Forest Becker, MD   Attending Provider(s) Admitting Provider PCP   Darcella Cheshire, MD; Marina Gravel, MD; Forest Becker, MD Forest Becker, MD Virgel Bouquet, MD   Verbal Order Info    Action Created on Order Mode Entered by Responsible Provider Signed by Signed on   Ordering 11/29/19 1235 Telephone with readback Polzin, Artois, RN Forest Becker, MD Forest Becker, MD 11/29/19 1335   Comments    Bariatric Bedside Commode, Tub Transfer Bench (heavy duty) needed for >99 months   NPI: [id number]     Diagnosis: COPD J44.1; [place] J18.9    Abnormality of gait: R26.9     CHF, Neuropathy, HTN, Morbid Obesity     Ht: 5 ft 8 in   Wt: 347 lbs         Order Questions    Question Answer Comment   Date of face-to-face (FTF) encounter: 11/29/2019    Medical conditions that necessitate Home Health care: M. N/A DME only. No skilled services needed    Clinical findings that support the need for Skilled Nursing. SN will: O. N/A    Clinical findings that support the need for Physical Therapy. PT will K. N/A    Clinical findings support the need for OT (needs SN/PT order).OT will F. N/A    Clinical findings that support the need for SLP. ST will F. N/A    Per clinical findings, following services are medically necessary: N/A    Evidence this patient is homebound because: O. N/A DME only    DME Bedside Commode     Other (add in comment)    Other (please specify) Tub Transfer Bench          Process Instructions    Please select [place] medically necessary.     Based on the above findings, I certify that this patient is confined to the home and needs intermittent skilled nursing care, physical therapry and / or speech therapy or continues to need occupational therapy. The patient is under my care, and I have initiated the establishment of the plan of care. This patient will be followed by a physician who will periodically review the plan of care.    Collection Information    Consult Order Info    ID Description Priority Start Date Start Time   [id number] [place] face-to-face (FTF) Encounter Routine 11/29/2019 12:33 PM   Provider Specialty Referred to   ______________________________________ _____________________________________   Acknowledgement Info    For At Acknowledged By Acknowledged On   Placing Order 11/29/19 1235 Barton Dubois, RN 11/29/19 1523   Verbal Order Info    Action Created on Order Mode Entered by Responsible Provider Signed by Signed on   Ordering 11/29/19 1235 Telephone with Delsa Sale, [place], RN Forest Becker, MD  Forest Becker, MD 11/29/19  [place] Date/Time   Tamala Ser, RN 11/29/19 1235   Patient Information    Patient Name   [name] Sex   Female DOB   1971/05/25   Additional Information    Associated Reports External References   View Parent Encounter [facility]   Priority and Order Details

## 2019-12-01 NOTE — Progress Notes (Signed)
[place]        Patient Name: [name], [name]     MRN: [id number]     CSN: [id number]       [place] #   [id number] Patient Class   Inpatient Service  Medicine Accommodation Code  Semi-Private     Admission Information    Admitting Physician:  Attending Physician: Forest Becker, MD   [address]  [address] L&D Status     Admitting Diagnosis: Shortness of breath; Chest tightness Room / Bed  [phone] L&D  Last Menstrual Cycle     Chief Complaint: Leg Swelling     Admit Type:  Admit Date/Time:  Discharge Date/Time: Emergency  11/25/2019 / 6144  11/29/2019 / 1753 Length of Stay: 3 Days   L&D EDD   Estimated Date of Delivery: None noted.     Patient Information            Home Address: [place]  [address] [place] [address] Employer:  Employer Address:     ,     Main Phone: [phone] Employer Phone:    SSN: [id number]     DOB: [birth year] (37 yrs)     Sex: Female Primary Care Physician: Virgel Bouquet, MD   Marital Status: Single Referring Physician:       No ref. provider found   Race: White or Caucasian     Ethnicity: Non Hispanic/Latino     Emergency Contacts  Name Home Phone Work Phone Mobile Phone Relationship Cecille Rubin   [place], [place] Mother         Guarantor Information    Guarantor Name: JOYANN, SPIDLE ID: [id number]   Guarantor Relationship to Pt: Self Guarantor Type: Personal/Family   Guarantor DOB:   1971-01-28     Guarantor Address: 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, drilling Name: [place] Name: [facility]   Insurance Address:   [place], [place] Subscriber DOB: May 04, 1971     Subscriber ID: [id number]    Insurance Phone: [phone] Pt Relationship to Sub:   Self   Insurance ID:      Group Name:  Preauthorization #: [id number]   Group #: [id number] Preauthorization Days:      Secondary Insurance    Insurance Name: - [place] Name:    Insurance underwriter Address:     ,   Veterinary surgeon DOB:      Subscriber ID:    Veterinary surgeon:  Pt Relationship to Sub:      Insurance ID:      Group Name:  Fish farm manager #:    Group #:  Preauthorization Days:      [facility] Name: Radiation protection practitioner Name:    Nutritional therapist:     ,   Veterinary surgeon DOB:      Subscriber ID:    Veterinary surgeon:  Pt Relationship to Sub:      Insurance ID:      Group Name:  Preauthorization #:    Group #:  Preauthorization Days:  12/01/2019 1:00 PM         Home Health face-to-face (FTF) Encounter (Order [id number])  Consult  Date: 11/29/2019 Department: [address] [place] Released By: Tamala Ser, RN (auto-released) Authorizing: Forest Becker, MD   Order Information    Order Date/Time Release Date/Time Start Date/Time End Date/Time   11/29/19 12:35 PM 11/29/19 12:35 PM 11/29/19 12:33 PM 11/29/19 12:33 PM   Order Details    Frequency Duration Priority Order Class   Once 1 occurrence Routine Hospital Performed   Provider Information    Ordering User Ordering Provider Authorizing Provider   Polzin, [place], RN Forest Becker, MD Forest Becker, MD   Attending Provider(s) Admitting Provider PCP   Darcella Cheshire, MD; Marina Gravel, MD; Forest Becker, MD Forest Becker, MD Virgel Bouquet, MD   Verbal Order Info    Action Created on Order Mode Entered by Responsible Provider Signed by Signed on   Ordering 11/29/19 1235 Telephone with readback Polzin, [place], RN Forest Becker, MD Forest Becker, MD 11/29/19 1335   Comments    Bariatric Bedside Commode, Tub Transfer Bench (heavy duty) needed for >99 months   NPI: [id number]     Diagnosis: COPD J44.1; Pnemonia J18.9    Abnormality of gait: R26.9     CHF, Neuropathy, HTN, Morbid Obesity     Ht: 5 ft 8 in   Wt: 347 lbs         Order Questions    Question Answer Comment   Date of face-to-face (FTF) encounter: 11/29/2019    Medical conditions that necessitate Home Health care: M. N/A DME only. No skilled services needed    Clinical findings that support the need for Skilled Nursing. SN will: O. N/A    Clinical findings that support the need for Physical Therapy. PT will K. N/A    Clinical findings support the need for OT (needs SN/PT order).OT will F. N/A    Clinical findings that support the need for SLP. ST will F. N/A    Per clinical findings, following services are medically necessary: N/A    Evidence this patient is homebound because: O. N/A DME only    DME Bedside Commode     Other (add in comment)    Other (please specify) Tub Transfer Bench          Process Instructions    Please select [place] medically necessary.     Based on the above findings, I certify that this patient is confined to the home and needs intermittent skilled nursing care, physical therapry and / or speech therapy or continues to need occupational therapy. The patient is under my care, and I have initiated the establishment of the plan of care. This patient will be followed by a physician who will periodically review the plan of care.    Collection Information    Consult Order Info    ID Description Priority Start Date Start Time   [id number] [place] face-to-face (FTF) Encounter Routine 11/29/2019 12:33 PM   Provider Specialty Referred to   ______________________________________ _____________________________________   Acknowledgement Info    For At Acknowledged By Acknowledged On   Placing Order 11/29/19 1235 Barton Dubois, RN 11/29/19 1523   Verbal Order Info    Action Created on Order Mode Entered by Responsible Provider Signed by Signed on   Ordering 11/29/19 1235 Telephone with Delsa Sale, [place], RN Forest Becker, MD  Forest Becker, MD 11/29/19  [place] Date/Time   Tamala Ser, RN 11/29/19 1235   Patient Information    Patient Name   [name], [name] Sex   Female DOB   1971/05/25   Additional Information    Associated Reports External References   View Parent Encounter [facility]   Priority and Order Details

## 2019-12-01 NOTE — Progress Notes (Addendum)
01/11 @ 1310  Name of DME company: [facility]  (P): [phone]   (F):  [phone]  Note:  Bariatric Bedside Commode, Tub Transfer Bench (heavy duty) order e-faxed via Epic directly to [facility] to be deliver to pt's home address pending pt's insurance review and approval.      Delivered 12/05/19

## 2020-03-06 ENCOUNTER — Emergency Department: Payer: Medicaid Other

## 2020-03-06 ENCOUNTER — Emergency Department
Admission: EM | Admit: 2020-03-06 | Discharge: 2020-03-06 | Disposition: A | Discharge status: Home / Self Care | Payer: Medicaid Other | Attending: Emergency Medicine | Admitting: Emergency Medicine

## 2020-03-06 DIAGNOSIS — M25561 Pain in right knee: Secondary | ICD-10-CM | POA: Insufficient documentation

## 2020-03-06 DIAGNOSIS — W182XXA Fall in (into) shower or empty bathtub, initial encounter: Secondary | ICD-10-CM | POA: Insufficient documentation

## 2020-03-06 DIAGNOSIS — G8929 Other chronic pain: Secondary | ICD-10-CM | POA: Insufficient documentation

## 2020-03-06 DIAGNOSIS — I252 Old myocardial infarction: Secondary | ICD-10-CM | POA: Insufficient documentation

## 2020-03-06 DIAGNOSIS — M170 Bilateral primary osteoarthritis of knee: Secondary | ICD-10-CM | POA: Insufficient documentation

## 2020-03-06 DIAGNOSIS — W19XXXA Unspecified fall, initial encounter: Secondary | ICD-10-CM

## 2020-03-06 DIAGNOSIS — Y93E1 Activity, personal bathing and showering: Secondary | ICD-10-CM | POA: Insufficient documentation

## 2020-03-06 DIAGNOSIS — F17210 Nicotine dependence, cigarettes, uncomplicated: Secondary | ICD-10-CM | POA: Insufficient documentation

## 2020-03-06 DIAGNOSIS — Z7951 Long term (current) use of inhaled steroids: Secondary | ICD-10-CM | POA: Insufficient documentation

## 2020-03-06 DIAGNOSIS — J449 Chronic obstructive pulmonary disease, unspecified: Secondary | ICD-10-CM | POA: Insufficient documentation

## 2020-03-06 DIAGNOSIS — M25562 Pain in left knee: Secondary | ICD-10-CM | POA: Insufficient documentation

## 2020-03-06 DIAGNOSIS — Z6841 Body Mass Index (BMI) 40.0 and over, adult: Secondary | ICD-10-CM | POA: Insufficient documentation

## 2020-03-06 NOTE — ED Provider Notes (Signed)
EMERGENCY DEPARTMENT HISTORY AND PHYSICAL EXAM     Physician/Midlevel provider first contact with patient: 03/06/20 1657         Date: (Not on file)  Patient Name: [name]    History of Presenting Illness     Chief Complaint   Patient presents with    Knee Pain    Fall       History Provided By: Patient    Chief Complaint: [name] is a 49 y.o. female who presents to the ED c/o bilateral knee pain; the left is worse than the right.  She states that she has had chronic knee pain for years.  She reports that she fell in her bathtub 02/27/2020.  She states that she was taken to [facility] on 03/02/2020 and had x-rays that showed no fracture.  Patient states that she was discharged home with pain medication but ran out.  She states that she has too many stairs to negotiate at home and she wants to be admitted to the hospital for a few days.  She denies any other injury.  She states that her orthopedic doctor told her that she will need joint replacements after she loses weight    PCP: Virgel Bouquet, MD        No current facility-administered medications for this encounter.     Current Outpatient Medications   Medication Sig Dispense Refill    albuterol (PROVENTIL HFA;VENTOLIN HFA) 108 (90 Base) MCG/ACT inhaler Inhale 2 puffs into the lungs every 4 (four) hours as needed for Wheezing 1 Inhaler 0    albuterol-ipratropium (DUO-NEB) 2.5-0.5(3) mg/3 mL nebulizer Take 3 mLs by nebulization every 6 (six) hours as needed (for SOB or Wheezing) 90 mL 0    ALPRAZolam (XANAX) 0.5 MG tablet Take 0.5 mg by mouth 2 (two) times daily as needed         atomoxetine (STRATTERA) 10 MG capsule Take 80 mg by mouth daily         benzonatate (TESSALON) 200 MG capsule Take 1 capsule (200 mg total) by mouth 3 (three) times daily as needed for Cough 30 capsule 0    cyclobenzaprine (FLEXERIL) 10 MG tablet Take 1 tablet (10 mg total) by mouth 3 (three) times daily as needed for Muscle spasms 6 tablet 0     famotidine (PEPCID) 20 MG tablet Take 1 tablet (20 mg total) by mouth every 12 (twelve) hours 60 tablet 0    fluticasone-salmeterol (ADVAIR DISKUS) 500-50 MCG/DOSE Aerosol Powder, Breath Activtivatede Inhale 1 puff into the lungs 2 (two) times daily 1 puff 0    furosemide (LASIX) 20 MG tablet Take 1 tablet (20 mg total) by mouth 2 (two) times daily 60 tablet 1    gabapentin (NEURONTIN) 800 MG tablet Take 800 mg by mouth 3 (three) times daily      guaiFENesin (MUCINEX) 600 MG 12 hr tablet Take 2 tablets (1,200 mg total) by mouth 2 (two) times daily 14 tablet 0    indomethacin (INDOCIN) 50 MG capsule Take 50 mg by mouth 3 (three) times daily with meals      lactobacillus/streptococcus (RISAQUAD) Cap Take 1 capsule by mouth daily 30 capsule 0    naloxone (NARCAN) 4 MG/0.1ML nasal spray 1 spray intranasally. If pt does not respond or relapses into respiratory depression call 911. Give additional doses every 2-3 min. 2 each 0    ondansetron (ZOFRAN-ODT) 4 MG disintegrating tablet Take 1 tablet (4 mg total) by mouth every  6 (six) hours as needed for Nausea 8 tablet 0    oxybutynin (DITROPAN) 5 MG tablet Take 1 tablet (5 mg total) by mouth 3 (three) times daily 90 tablet 1    oxyCODONE-acetaminophen (PERCOCET) 10-325 MG per tablet Take 1 tablet by mouth every 4 (four) hours as needed for Pain 15 tablet 0    risperiDONE (RISPERDAL) 3 MG tablet Take 3 mg by mouth 2 (two) times daily      sertraline (ZOLOFT) 100 MG tablet Take 100 mg by mouth 2 (two) times daily            Past History     Past Medical History:  Past Medical History:   Diagnosis Date    Anxiety     Chronic obstructive pulmonary disease     Myocardial infarction     Panic attacks        Past Surgical History:  Past Surgical History:   Procedure Laterality Date    APPENDECTOMY      ARTHROSCOPIC KNEE, ACL RECONSTRUCTION      R. knee     CHOLECYSTECTOMY         Family History:  Family History   Problem Relation Age of Onset    Heart attack  Father     Diabetes Father     Asthma Sister     Diabetes Maternal Grandmother     Diabetes Paternal Grandmother     Heart disease Paternal Grandmother        Social History:  Social History     Tobacco Use    Smoking status: Current Every Day Smoker     Packs/day: 1.00     Years: 25.00     Pack years: 25.00     Types: Cigarettes     Last attempt to quit: 12/29/2018     Years since quitting: 1.1    Smokeless tobacco: Never Used   Substance Use Topics    Alcohol use: Yes    Drug use: Yes     Comment: Marijuana       Allergies:  Allergies   Allergen Reactions    Contrast [Iodinated Diagnostic Agents]        Review of Systems     Review of Systems   Constitutional: Negative for activity change, chills, diaphoresis, fatigue and fever.   HENT: Negative.    Eyes: Negative for photophobia, discharge and redness.   Respiratory: Negative for cough, chest tightness, shortness of breath, wheezing and stridor.    Gastrointestinal: Negative for abdominal pain, diarrhea, nausea and vomiting.   Genitourinary: Negative.    Skin: Negative.    Neurological: Negative for dizziness, tremors, syncope, facial asymmetry, speech difficulty, weakness, light-headedness, numbness and headaches.   Hematological: Negative.    Psychiatric/Behavioral: Negative for agitation, behavioral problems, confusion and suicidal ideas.       Physical Exam   BP 113/76    Pulse 94    Temp 97.8 F (36.6 C)    Resp 18    Ht _0  (1.727 m)    Wt 151 kg    SpO2 97%    BMI 50.63 kg/m     Physical Exam   Constitutional: She is oriented to person, place, and time. She appears well-developed and well-nourished. No distress.   HENT:   Head: Normocephalic and atraumatic.   Right Ear: External ear normal.   Mouth/Throat: Oropharynx is clear and moist.   Eyes: Pupils are equal, round, and reactive to light. Conjunctivae and EOM  are normal. Right eye exhibits no discharge. Left eye exhibits no discharge.   Neck: No tracheal deviation present.   Cardiovascular:  Normal rate, regular rhythm, normal heart sounds and intact distal pulses.   No murmur heard.  Pulmonary/Chest: Effort normal and breath sounds normal. No accessory muscle usage or stridor. No tachypnea and no bradypnea. No respiratory distress. She has no decreased breath sounds. She has no wheezes. She has no rhonchi. She has no rales. She exhibits no tenderness.   Abdominal: Soft. She exhibits no distension. There is no abdominal tenderness.   Musculoskeletal:         General: Normal range of motion.      Cervical back: Normal range of motion and neck supple.      Right knee: Bony tenderness present. No deformity, effusion, erythema or ecchymosis. Normal range of motion. No medial joint line, lateral joint line, MCL, LCL or patellar tendon tenderness. Normal alignment.      Left knee: Bony tenderness present. No swelling, effusion, erythema or ecchymosis. Normal range of motion. No medial joint line, lateral joint line or patellar tendon tenderness. Normal alignment and normal meniscus.        Legs:       Comments: Advanced osteoarthritis both knees.  No gross deformity.  Distal neuromotor vascular status is intact.  There is no pitting edema.Marland Kitchen   Lymphadenopathy:     She has no cervical adenopathy.   Neurological: She is alert and oriented to person, place, and time. Coordination normal.   Skin: Skin is warm and dry. No rash noted. She is not diaphoretic.   Psychiatric: She has a normal mood and affect. Her behavior is normal. Judgment and thought content normal.   Nursing note and vitals reviewed.      Diagnostic Study Results     Radiologic Studies -   Radiology Results (24 Hour)     Procedure Component Value Units Date/Time    Knee 4+ Views Left [[id number]] Collected: 03/06/20 1627    Order Status: Completed Updated: 03/06/20 1631    Narrative:      Indication: Fall, pain.    FINDINGS: Left knee. 4 view study. No prior study for comparison.  Tricompartment osteoarthritic change. Significant narrowing of the   medial joint compartment with large bone spurs. Significant narrowing of  the patellofemoral joint with large superior and inferior bone spurs and  femoral bone spurs. Large anterior tibial bone spur. Posterior femoral  bone spurs and ligament calcifications.      Impression:       Severe tricompartment osteoarthritic change. Osteopenia. No  plain film evidence of fracture.    Jennell Corner, MD   03/06/2020 4:29 PM        Patient refused x-ray of her right knee.    Medical Decision Making   I am the first provider for this patient.    I reviewed today's vital signs and ED nursing notes.    Vital Signs-Reviewed the patient's vital signs.     Patient Vitals for the past 12 hrs:   BP Temp Pulse Resp   03/06/20 1537 113/76 97.8 F (36.6 C) 94 18       Pulse Oximetry Analysis - Normal, 97% on RA    Personal Protective Equipment (PPE): Gloves, Face shield,  N95/surgical mask, and Surgical Cap      Discharge:     Discussed results with pt and counseled on the diagnosis, f/u plans, medication use, and signs and symptoms when to return  to ED.  Pt is stable and ready for discharge. All questions solicited and addressed.    Diagnosis     Clinical Impression:   1. Bilateral chronic knee pain    2. Tricompartment osteoarthritis of both knees    3. Fall, initial encounter    4. Morbid obesity with body mass index (BMI) of 50.0 to 59.9 in adult            This chart was generated using hospital voice-recognition software which does not employ spell-checking or grammar-checking features. It was dictated, all or in part, in a busy and often noisy patient care environment. I have taken all usual measures to dictate carefully and to review all aspects this chart. Nonetheless, given the known and well-documented performance characteristics of VR software in such patient care environments, this dictation still may contain unrecognized and wholly unintended errors or omissions  _______________________________           Truddie Hidden, PA  03/06/20 2220       Elie Goody, MD  03/09/20 1028

## 2020-03-06 NOTE — ED Provider Notes (Incomplete)
EMERGENCY DEPARTMENT HISTORY AND PHYSICAL EXAM     Physician/Midlevel provider first contact with patient: 03/06/20 1657         Date: (Not on file)  Patient Name: [name]    History of Presenting Illness     Chief Complaint   Patient presents with    Knee Pain    Fall       History Provided By: Patient    Chief Complaint: [name] is a 49 y.o. female who presents to the ED c/o     [address] 02/27/20    PCP: Virgel Bouquet, MD        No current facility-administered medications for this encounter.     Current Outpatient Medications   Medication Sig Dispense Refill    albuterol (PROVENTIL HFA;VENTOLIN HFA) 108 (90 Base) MCG/ACT inhaler Inhale 2 puffs into the lungs every 4 (four) hours as needed for Wheezing 1 Inhaler 0    albuterol-ipratropium (DUO-NEB) 2.5-0.5(3) mg/3 mL nebulizer Take 3 mLs by nebulization every 6 (six) hours as needed (for SOB or Wheezing) 90 mL 0    ALPRAZolam (XANAX) 0.5 MG tablet Take 0.5 mg by mouth 2 (two) times daily as needed         atomoxetine (STRATTERA) 10 MG capsule Take 80 mg by mouth daily         benzonatate (TESSALON) 200 MG capsule Take 1 capsule (200 mg total) by mouth 3 (three) times daily as needed for Cough 30 capsule 0    cyclobenzaprine (FLEXERIL) 10 MG tablet Take 1 tablet (10 mg total) by mouth 3 (three) times daily as needed for Muscle spasms 6 tablet 0    famotidine (PEPCID) 20 MG tablet Take 1 tablet (20 mg total) by mouth every 12 (twelve) hours 60 tablet 0    fluticasone-salmeterol (ADVAIR DISKUS) 500-50 MCG/DOSE Aerosol Powder, Breath Activtivatede Inhale 1 puff into the lungs 2 (two) times daily 1 puff 0    furosemide (LASIX) 20 MG tablet Take 1 tablet (20 mg total) by mouth 2 (two) times daily 60 tablet 1    gabapentin (NEURONTIN) 800 MG tablet Take 800 mg by mouth 3 (three) times daily      guaiFENesin (MUCINEX) 600 MG 12 hr tablet Take 2 tablets (1,200 mg total) by mouth 2 (two) times daily 14 tablet 0    indomethacin (INDOCIN) 50 MG  capsule Take 50 mg by mouth 3 (three) times daily with meals      lactobacillus/streptococcus (RISAQUAD) Cap Take 1 capsule by mouth daily 30 capsule 0    naloxone (NARCAN) 4 MG/0.1ML nasal spray 1 spray intranasally. If pt does not respond or relapses into respiratory depression call 911. Give additional doses every 2-3 min. 2 each 0    ondansetron (ZOFRAN-ODT) 4 MG disintegrating tablet Take 1 tablet (4 mg total) by mouth every 6 (six) hours as needed for Nausea 8 tablet 0    oxybutynin (DITROPAN) 5 MG tablet Take 1 tablet (5 mg total) by mouth 3 (three) times daily 90 tablet 1    oxyCODONE-acetaminophen (PERCOCET) 10-325 MG per tablet Take 1 tablet by mouth every 4 (four) hours as needed for Pain 15 tablet 0    risperiDONE (RISPERDAL) 3 MG tablet Take 3 mg by mouth 2 (two) times daily      sertraline (ZOLOFT) 100 MG tablet Take 100 mg by mouth 2 (two) times daily            Past History  Past Medical History:  Past Medical History:   Diagnosis Date    Anxiety     Chronic obstructive pulmonary disease     Myocardial infarction     Panic attacks        Past Surgical History:  Past Surgical History:   Procedure Laterality Date    APPENDECTOMY      ARTHROSCOPIC KNEE, ACL RECONSTRUCTION      R. knee     CHOLECYSTECTOMY         Family History:  Family History   Problem Relation Age of Onset    Heart attack Father     Diabetes Father     Asthma Sister     Diabetes Maternal Grandmother     Diabetes Paternal Grandmother     Heart disease Paternal Grandmother        Social History:  Social History     Tobacco Use    Smoking status: Current Every Day Smoker     Packs/day: 1.00     Years: 25.00     Pack years: 25.00     Types: Cigarettes     Last attempt to quit: 12/29/2018     Years since quitting: 1.1    Smokeless tobacco: Never Used   Substance Use Topics    Alcohol use: Yes    Drug use: Yes     Comment: Marijuana       Allergies:  Allergies   Allergen Reactions    Contrast [[employer]]        Review of Systems     Review of Systems    Physical Exam   BP 113/76    Pulse 94    Temp 97.8 F (36.6 C)    Resp 18    Ht _0  (1.727 m)    Wt 151 kg    SpO2 97%    BMI 50.63 kg/m     Physical Exam   Constitutional: She is oriented to person, place, and time. She appears well-developed and well-nourished. No distress.   HENT:   Head: Normocephalic and atraumatic.   Right Ear: External ear normal.   Mouth/Throat: Oropharynx is clear and moist.   Eyes: Pupils are equal, round, and reactive to light. Conjunctivae and EOM are normal. Right eye exhibits no discharge. Left eye exhibits no discharge.   Neck: No tracheal deviation present.   Cardiovascular: Normal rate, regular rhythm, normal heart sounds and intact distal pulses.   No murmur heard.  Pulmonary/Chest: Effort normal and breath sounds normal. No accessory muscle usage or stridor. No tachypnea and no bradypnea. No respiratory distress. She has no decreased breath sounds. She has no wheezes. She has no rhonchi. She has no rales. She exhibits no tenderness.   Abdominal: Soft. She exhibits no distension. There is no abdominal tenderness.   Lymphadenopathy:     She has no cervical adenopathy.   Neurological: She is alert and oriented to person, place, and time. Coordination normal.   Skin: Skin is warm and dry. No rash noted. She is not diaphoretic.   Psychiatric: She has a normal mood and affect. Her behavior is normal. Judgment and thought content normal.   Nursing note and vitals reviewed.      Diagnostic Study Results     Labs -     Results     ** No results found for the last 24 hours. **          Radiologic Studies -  Radiology Results (24 Hour)     Procedure Component Value Units Date/Time    Knee 4+ Views Left [[id number]] Collected: 03/06/20 1627    Order Status: Completed Updated: 03/06/20 1631    Narrative:      Indication: Fall, pain.    FINDINGS: Left knee. 4 view study. No prior study for comparison.  Tricompartment osteoarthritic  change. Significant narrowing of the  medial joint compartment with large bone spurs. Significant narrowing of  the patellofemoral joint with large superior and inferior bone spurs and  femoral bone spurs. Large anterior tibial bone spur. Posterior femoral  bone spurs and ligament calcifications.      Impression:       Severe tricompartment osteoarthritic change. Osteopenia. No  plain film evidence of fracture.    Jennell Corner, MD   03/06/2020 4:29 PM        Patient refused x-ray of her right knee.    Medical Decision Making   I am the first provider for this patient.    I reviewed today's vital signs and ED nursing notes.    Vital Signs-Reviewed the patient's vital signs.     Patient Vitals for the past 12 hrs:   BP Temp Pulse Resp   03/06/20 1537 113/76 97.8 F (36.6 C) 94 18       Pulse Oximetry Analysis - Normal, 97% on RA    Personal Protective Equipment (PPE): Gloves, Face shield,  N95/surgical mask, and Surgical Cap      Provider Notes:        Procedures:      Core Measures:    PAIN MEDICATION FOR FRACTURE  Patient was offered analgesics for pain control of potential long bone fracture upon initial contact with this provider.     Patient {ACCEPTED/DECLINED:21183}.    ------------------ PROCEDURE: INITIAL FRACTURE CARE  ------    Performed by the emergency provider  Consent:  Informed consent, after discussion of the risks, benefits, and alternatives to the procedure, was obtained  Indication: The patient has a fracture of the ***.     Procedure: The fracture {WAS/WAS [phone]::"was not"} displaced and {Desc; did/not:14019} require manipulation in the ED.   Assessment: The fracture was appropriately immobilized and was neurovascularly intact at discharge.    ------------------- PROCEDURE: SPLINT APPLICATION  --------    Applied by  ***, supervised by emergency provider.   Location: ***  Procedure: The area of the splint was appropriately positioned.  A *** was applied for definitive management .    Post-procedure: Good position.  Neurovascular status remains intact.  Patient tolerated the procedure well with no immediate complications        [place] records review:    [facility] Opioid Prescribing Decision Support  FYI/FAQ regarding the [employer] [facility] Opioid Prescribing Decision Support available here.    I am ***not prescribing opioids at a quantity of > 50 MME/day.   *If prescribing at quantity of > 50 MME/day, document the reason[s].*    I am ***not co-prescribing naloxone based on the [place] [facility] regulations governing opioid prescriptions.    I have selected an appropriate agent, the lowest dose, and the shortest duration of therapy possible based on this patient's condition. I am recommending the patient discontinue opioids after completion of this prescription unless further opioids are deemed necessary and prescribed by another licensed medical provider.    When is Querying the PMP Required for Opioid Prescribing?   Opioid prescribing > 7 days  for acute pain   Opioid use for pain lasting > 3 months   [place] PMP Login              Critical Care Time: NONE        Discharge:     Discussed *** results with pt / family and counseled on the diagnosis, f/u plans, medication use, and signs and symptoms when to return to ED.  Pt is stable and ready for discharge. Possibility of evolving illness reviewed. All questions solicited and addressed.    Diagnosis     Clinical Impression:   1. Bilateral chronic knee pain    2. Tricompartment osteoarthritis of both knees    3. Fall, initial encounter            This chart was generated using hospital voice-recognition software which does not employ spell-checking or grammar-checking features. It was dictated, all or in part, in a busy and often noisy patient care environment. I have taken all usual measures to dictate carefully and to review all aspects this chart. Nonetheless, given the known and well-documented  performance characteristics of VR software in such patient care environments, this dictation still may contain unrecognized and wholly unintended errors or omissions  _______________________________

## 2020-03-06 NOTE — EDIE (Signed)
COLLECTIVE?NOTIFICATION?03/06/2020 15:07?[name], [name]?MRN: [id number]    Criteria Met      5 ED Visits in 12 Months    Security and Safety  No recent Security Events currently on file    ED Care Guidelines  There are currently no ED Care Guidelines for this patient. Please check your facility's medical records system.        Prescription Monitoring Program  000??- Narcotic Use Score  000??- Sedative Use Score  000??- Stimulant Use Score  000??- Overdose Risk Score  - All Scores range from 000-999 with 75% of the population scoring < 200 and on 1% scoring above 650  - The last digit of the narcotic, sedative, and stimulant score indicates the number of active prescriptions of that type  - Higher Use scores correlate with increased prescribers, pharmacies, mg equiv, and overlapping prescriptions  - Higher Overdose Risk Scores correlate with increased risk of unintentional overdose death   Concerning or unexpectedly high scores should prompt a review of the PMP record; this does not constitute checking PMP for prescribing purposes.      E.D. Visit Count (12 mo.)  Facility Visits   [facility] 5   Total 5   Note: Visits indicate total known visits.     Recent Emergency Department Visit Summary  Date Facility [facility] Type Diagnoses or Chief Complaint   Mar 06, 2020 [place]. [facility]      fall,left leg pain      Nov 25, 2019 [place]. [facility]      Leg Swelling      Jul 10, 2019 [place]. [facility]      knee pain      Pain in right knee      Other chronic pain      Pain in left knee      Morbid (severe) obesity due to excess calories      Jul 01, 2019 [place]. [facility]. [facility]      COVID test      Chest tightness, cough COVID test      Chest congestion      Acute bronchitis, unspecified      Jun 04, 2019 [place]. [facility]. [facility]      Vag bleeding ,abd cramping      Hip Pain      Vaginal Bleeding       Shortness of breath      Urinary tract infection, site not specified      Pain in right hip      Other general symptoms and signs      Abnormal uterine and vaginal bleeding, unspecified          Recent Inpatient Visit Summary  Date [place] Type Diagnoses or Chief Complaint   Nov 25, 2019 [place]. [facility]      Shortness of breath      Other chest pain          Care Team  There is not a care team on record at this time.   Collective Portal  This patient has registered at the [facility] Emergency Department   For more information visit: [email or web address]     PLEASE NOTE:     1.   Any care recommendations and other clinical information are provided as guidelines or for historical purposes only,  and providers should exercise their own clinical judgment when providing care.    2.   You may only use this information for purposes of treatment, payment or health care operations activities, and subject to the limitations of applicable [employer] Policies.    3.   You should consult directly with the organization that provided a care guideline or other clinical history with any questions about additional information or accuracy or completeness of information provided.    ? 2021 [employer] - [email or web address]

## 2020-03-06 NOTE — ED Triage Notes (Signed)
EMERGENCY DEPARTMENT PIT NOTE    Patient Name: [name] has had a rapid medical screening evaluation by me for the chief complaint of bilateral knee pain; the left is worse than the right.  She states that she has had chronic knee pain for years.  She reports that she fell in her bathtub about 17 days ago.  She states that she was taken to [facility] on 03/02/2020 and had x-rays that showed no fracture.  Patient states that she was discharged home with pain medication but ran out.  She states that she has too many stairs to negotiate at home and she wants to be admitted to the hospital for a few days.  She denies any other injury.  She states that her orthopedic doctor told her that she will need joint replacements after she loses weight.      Vitals: There were no vitals taken for this visit.    Pertinent Brief Exam:  Constitutional: oriented to person, place, and time.  Morbidly obese.  Cardiovascular: Normal rate, regular rhythm.   Pulmonary/Chest: Effort normal and breath sounds normal.   Musculoskeletal: Bilateral knee tenderness.  No obvious deformity noted.  Distal neuromotor vascular status is intact.  Neurological: alert, oriented to person, place, and time and easily aroused. Coordination normal.   Skin: Skin is warm and dry. No rash noted. Not diaphoretic.   Psychiatric: Alert and oriented      Prelim orders: none    Patient advised to remain in the ED until further evaluation can be performed. Patient instructed to notify staff of any changes in condition while waiting.    I am not the sole provider and this assessment is only an initial evaluation to expedite care prior to full evaluation.

## 2020-03-06 NOTE — Discharge Instructions (Signed)
Degenerative Joint Disease    You have been diagnosed with degenerative joint disease (DJD).    Healthy joints have cartilage or soft bone that works like a cushion. When the cartilage wears down, DJD develops.     Symptoms of DJD include clicking or grinding in the joint or pain that gets worse when the joint is used. DJD can occur anywhere but it is most common in the knees, hips and shoulders.     Taking medication as prescribed will help control your pain.    YOU SHOULD SEEK MEDICAL ATTENTION IMMEDIATELY, EITHER HERE OR AT THE NEAREST EMERGENCY DEPARTMENT, IF ANY OF THE FOLLOWING OCCURS:   The joint looks swollen or red.   Your joint hurts too much to use. (For example, if your knee hurts too much to walk on it).   You have fever (temperature higher than 100.98F / 38C), or shaking chills.

## 2020-03-06 NOTE — ED Triage Notes (Signed)
Pt had fall on 02/27/20 and is c/o bilateral knee pain.

## 2020-03-06 NOTE — EDIE (Signed)
COLLECTIVE?NOTIFICATION?03/06/2020 15:07?JAELEEN, [name]?MRN: [id number]    Criteria Met      5 ED Visits in 12 Months    Security and Safety  No recent Security Events currently on file    ED Care Guidelines  There are currently no ED Care Guidelines for this patient. Please check your facility's medical records system.        Prescription Monitoring Program  000??- Narcotic Use Score  000??- Sedative Use Score  000??- Stimulant Use Score  000??- Overdose Risk Score  - All Scores range from 000-999 with 75% of the population scoring < 200 and on 1% scoring above 650  - The last digit of the narcotic, sedative, and stimulant score indicates the number of active prescriptions of that type  - Higher Use scores correlate with increased prescribers, pharmacies, mg equiv, and overlapping prescriptions  - Higher Overdose Risk Scores correlate with increased risk of unintentional overdose death   Concerning or unexpectedly high scores should prompt a review of the PMP record; this does not constitute checking PMP for prescribing purposes.      E.D. Visit Count (12 mo.)  Facility Visits   [facility] 5   Total 5   Note: Visits indicate total known visits.     Recent Emergency Department Visit Summary  Date Facility [facility] Type Diagnoses or Chief Complaint   Mar 06, 2020 [place]. [facility]. [facility]      Pain in left knee      Bilateral primary osteoarthritis of knee      Pain in right knee      Unspecified fall, initial encounter      Other chronic pain      Nov 25, 2019 [place]. [facility]. [facility]      Leg Swelling      Jul 10, 2019 [place]. Alexa. [facility]      knee pain      Pain in right knee      Other chronic pain      Pain in left knee      Morbid (severe) obesity due to excess calories      Jul 01, 2019 [place]. Alexa. [facility]      COVID test      Chest tightness, cough COVID test      Chest congestion      Acute bronchitis,  unspecified      Jun 04, 2019 [place]. Alexa. [facility]      Vag bleeding ,abd cramping      Hip Pain      Vaginal Bleeding      Shortness of breath      Urinary tract infection, site not specified      Pain in right hip      Other general symptoms and signs      Abnormal uterine and vaginal bleeding, unspecified          Recent Inpatient Visit Summary  Date [place] Type Diagnoses or Chief Complaint   Nov 25, 2019 [place]. [facility]. [facility]      Shortness of breath      Other chest pain          Care Team  There is not a care team on record at this time.   Collective Portal  This patient has registered at the [facility] Emergency  Department   For more information visit: [email or web address]     PLEASE NOTE:     1.   Any care recommendations and other clinical information are provided as guidelines or for historical purposes only, and providers should exercise their own clinical judgment when providing care.    2.   You may only use this information for purposes of treatment, payment or health care operations activities, and subject to the limitations of applicable [employer] Policies.    3.   You should consult directly with the organization that provided a care guideline or other clinical history with any questions about additional information or accuracy or completeness of information provided.    ? 2021 [employer] - [email or web address]

## 2020-05-09 ENCOUNTER — Emergency Department
Admission: EM | Admit: 2020-05-09 | Discharge: 2020-05-09 | Disposition: A | Discharge status: Home / Self Care | Payer: Medicaid Other | Attending: Emergency Medicine | Admitting: Emergency Medicine

## 2020-05-09 ENCOUNTER — Emergency Department: Payer: Medicaid Other

## 2020-05-09 DIAGNOSIS — I252 Old myocardial infarction: Secondary | ICD-10-CM | POA: Insufficient documentation

## 2020-05-09 DIAGNOSIS — Z7951 Long term (current) use of inhaled steroids: Secondary | ICD-10-CM | POA: Insufficient documentation

## 2020-05-09 DIAGNOSIS — M1712 Unilateral primary osteoarthritis, left knee: Secondary | ICD-10-CM | POA: Insufficient documentation

## 2020-05-09 DIAGNOSIS — J449 Chronic obstructive pulmonary disease, unspecified: Secondary | ICD-10-CM | POA: Insufficient documentation

## 2020-05-09 DIAGNOSIS — M25562 Pain in left knee: Secondary | ICD-10-CM

## 2020-05-09 DIAGNOSIS — F17210 Nicotine dependence, cigarettes, uncomplicated: Secondary | ICD-10-CM | POA: Insufficient documentation

## 2020-05-09 MED ORDER — INDOMETHACIN 50 MG PO CAPS
50.00 mg | ORAL_CAPSULE | Freq: Three times a day (TID) | ORAL | 0 refills | Status: DC
Start: 2020-05-09 — End: 2020-10-09

## 2020-05-09 MED ORDER — HYDROCODONE-ACETAMINOPHEN 5-325 MG PO TABS
1.0000 | ORAL_TABLET | Freq: Once | ORAL | Status: AC
Start: 2020-05-09 — End: 2020-05-09
  Administered 2020-05-09: 17:00:00 1 via ORAL
  Filled 2020-05-09: qty 1

## 2020-05-09 MED ORDER — IBUPROFEN 600 MG PO TABS
600.0000 mg | ORAL_TABLET | Freq: Once | ORAL | Status: AC
Start: 2020-05-09 — End: 2020-05-09
  Administered 2020-05-09: 17:00:00 600 mg via ORAL
  Filled 2020-05-09: qty 1

## 2020-05-09 NOTE — ED Provider Notes (Signed)
EMERGENCY DEPARTMENT HISTORY AND PHYSICAL EXAM     Physician/Midlevel provider first contact with patient: 05/09/20 1614         Date: 05/09/2020  Patient Name: [name]    History of Presenting Illness     Chief Complaint   Patient presents with    Knee Pain     Left       History Provided By: Patient    Chief Complaint: FREDDIE NGHIEM is a 49 y.o. female who presents to the ED via [facility] from home c/o left knee pain that began yesterday morning.  She does admit that she has a history of arthritis that she states will require knee replacement surgery "after I lose weight".  She denies any recent injury, numbness, weakness, or rash.  Patient states that her pain is 9/10, constant, dull, nonradiating, and worse with movement.  She states she has not taken any medication for the pain because she has "nothing to take".      PCP: Virgel Bouquet, MD        No current facility-administered medications for this encounter.     Current Outpatient Medications   Medication Sig Dispense Refill    albuterol (PROVENTIL HFA;VENTOLIN HFA) 108 (90 Base) MCG/ACT inhaler Inhale 2 puffs into the lungs every 4 (four) hours as needed for Wheezing 1 Inhaler 0    albuterol-ipratropium (DUO-NEB) 2.5-0.5(3) mg/3 mL nebulizer Take 3 mLs by nebulization every 6 (six) hours as needed (for SOB or Wheezing) 90 mL 0    ALPRAZolam (XANAX) 0.5 MG tablet Take 0.5 mg by mouth 2 (two) times daily as needed         atomoxetine (STRATTERA) 10 MG capsule Take 80 mg by mouth daily         benzonatate (TESSALON) 200 MG capsule Take 1 capsule (200 mg total) by mouth 3 (three) times daily as needed for Cough 30 capsule 0    cyclobenzaprine (FLEXERIL) 10 MG tablet Take 1 tablet (10 mg total) by mouth 3 (three) times daily as needed for Muscle spasms 6 tablet 0    famotidine (PEPCID) 20 MG tablet Take 1 tablet (20 mg total) by mouth every 12 (twelve) hours 60 tablet 0    fluticasone-salmeterol (ADVAIR DISKUS) 500-50 MCG/DOSE  Aerosol Powder, Breath Activtivatede Inhale 1 puff into the lungs 2 (two) times daily 1 puff 0    furosemide (LASIX) 20 MG tablet Take 1 tablet (20 mg total) by mouth 2 (two) times daily 60 tablet 1    gabapentin (NEURONTIN) 800 MG tablet Take 800 mg by mouth 3 (three) times daily      guaiFENesin (MUCINEX) 600 MG 12 hr tablet Take 2 tablets (1,200 mg total) by mouth 2 (two) times daily 14 tablet 0    indomethacin (INDOCIN) 50 MG capsule Take 1 capsule (50 mg total) by mouth 3 (three) times daily with meals 30 capsule 0    lactobacillus/streptococcus (RISAQUAD) Cap Take 1 capsule by mouth daily 30 capsule 0    naloxone (NARCAN) 4 MG/0.1ML nasal spray 1 spray intranasally. If pt does not respond or relapses into respiratory depression call 911. Give additional doses every 2-3 min. 2 each 0    ondansetron (ZOFRAN-ODT) 4 MG disintegrating tablet Take 1 tablet (4 mg total) by mouth every 6 (six) hours as needed for Nausea 8 tablet 0    oxybutynin (DITROPAN) 5 MG tablet Take 1 tablet (5 mg total) by mouth 3 (three) times daily 90 tablet 1  oxyCODONE-acetaminophen (PERCOCET) 10-325 MG per tablet Take 1 tablet by mouth every 4 (four) hours as needed for Pain 15 tablet 0    risperiDONE (RISPERDAL) 3 MG tablet Take 3 mg by mouth 2 (two) times daily      sertraline (ZOLOFT) 100 MG tablet Take 100 mg by mouth 2 (two) times daily            Past History     Past Medical History:  Past Medical History:   Diagnosis Date    Anxiety     Chronic obstructive pulmonary disease     Myocardial infarction     Panic attacks        Past Surgical History:  Past Surgical History:   Procedure Laterality Date    APPENDECTOMY      ARTHROSCOPIC KNEE, ACL RECONSTRUCTION      R. knee     CHOLECYSTECTOMY         Family History:  Family History   Problem Relation Age of Onset    Heart attack Father     Diabetes Father     Asthma Sister     Diabetes Maternal Grandmother     Diabetes Paternal Grandmother     Heart disease  Paternal Grandmother        Social History:  Social History     Tobacco Use    Smoking status: Current Every Day Smoker     Packs/day: 1.00     Years: 25.00     Pack years: 25.00     Types: Cigarettes     Last attempt to quit: 12/29/2018     Years since quitting: 1.3    Smokeless tobacco: Never Used   Vaping Use    Vaping Use: Never used   Substance Use Topics    Alcohol use: Yes    Drug use: Yes     Comment: Marijuana       Allergies:  Allergies   Allergen Reactions    Contrast [Iodinated Diagnostic Agents]        Review of Systems     Review of Systems   Constitutional: Negative for appetite change, diaphoresis and fever.   HENT: Negative.    Eyes: Negative for discharge, redness and visual disturbance.   Respiratory: Negative for cough, chest tightness and shortness of breath.    Cardiovascular: Negative.    Gastrointestinal: Negative for abdominal pain, nausea and vomiting.   Genitourinary: Negative.    Musculoskeletal: Positive for arthralgias, back pain (Chronic) and joint swelling (Chronic). Negative for neck stiffness.   Neurological: Negative for dizziness, seizures, syncope, facial asymmetry and headaches.   Hematological: Negative.    Psychiatric/Behavioral: Negative for agitation, decreased concentration and suicidal ideas. The patient is nervous/anxious.        Physical Exam   BP 118/79    Pulse 77    Temp 99.5 F (37.5 C) (Oral)    Resp 16    Ht _0  (1.727 m)    Wt 149.7 kg    SpO2 97%    BMI 50.18 kg/m     Physical Exam  Vitals and nursing note reviewed.   Constitutional:       General: She is not in acute distress.     Appearance: Normal appearance. She is well-developed. She is not ill-appearing or toxic-appearing.   HENT:      Head: Normocephalic and atraumatic.      Right Ear: External ear normal.      Left Ear: External  ear normal.      Nose: Nose normal.   Eyes:      General:         Right eye: No discharge.         Left eye: No discharge.      Conjunctiva/sclera: Conjunctivae normal.       Pupils: Pupils are equal, round, and reactive to light.   Cardiovascular:      Rate and Rhythm: Normal rate and regular rhythm.      Pulses: Normal pulses.   Pulmonary:      Effort: Pulmonary effort is normal. No respiratory distress.   Abdominal:      General: Bowel sounds are normal. There is no distension.      Palpations: Abdomen is soft. There is no mass.      Tenderness: There is no abdominal tenderness. There is no rebound.   Musculoskeletal:      Cervical back: Normal range of motion and neck supple.      Left knee: Bony tenderness present. No swelling, deformity, effusion, erythema, ecchymosis, lacerations or crepitus. Decreased range of motion. No LCL laxity, MCL laxity, ACL laxity or PCL laxity.Normal alignment, normal meniscus and normal patellar mobility. Normal pulse.        Legs:       Comments: Distal neuromotor vascular status is intact.   Skin:     General: Skin is warm and dry.      Capillary Refill: Capillary refill takes 2 to 3 seconds.   Neurological:      General: No focal deficit present.      Mental Status: She is alert and oriented to person, place, and time.      Sensory: No sensory deficit.      Motor: No weakness.      Coordination: Coordination normal.   Psychiatric:         Mood and Affect: Mood normal.         Behavior: Behavior normal.         Thought Content: Thought content normal.         Judgment: Judgment normal.         Diagnostic Study Results       Radiologic Studies -   Radiology Results (24 Hour)     Procedure Component Value Units Date/Time    Knee 4+ Views Left [[id number]] Collected: 05/09/20 1640    Order Status: Completed Updated: 05/09/20 1644    Narrative:      HISTORY: 49 year old female with acute exacerbation of chronic left knee  pain. History of advanced degenerative arthritis by prior x-rays.    COMPARISON: Prior study from April    EXAM: Left knee, 4 views    FINDINGS: Again, there is narrowing in all 3 compartments of the left  knee with focal osteophyte  formation and calcified loose bodies. There  is surrounding soft tissue swelling.      Impression:         1. Advanced degenerative arthritis in all 3 compartments with associated  intra-articular loose bodies. Follow-up with orthopedics for  consideration joint replacement is recommended.    Ricky Ala, MD   05/09/2020 4:42 PM            Medical Decision Making   I am the first provider for this patient.    I reviewed today's vital signs and ED nursing notes.    Vital Signs-Reviewed the patient's vital signs.     Patient Vitals for the past  12 hrs:   BP Temp Pulse Resp   05/09/20 1618 118/79 99.5 F (37.5 C) 77 16       Pulse Oximetry Analysis - Normal, 97% on RA    Personal Protective Equipment (PPE): Gloves, Face shield,  N95/surgical mask, and Surgical Cap      Provider Notes:  Patient requests Indocin because it works well for her joint pain        Discharge:     Discussed x-ray results with pt Tylenol  And counseled on the diagnosis, f/u plans, medication use, and signs and symptoms when to return to ED.  Pt is stable and ready for discharge. All questions solicited and addressed.    Diagnosis     Clinical Impression:   1. Chronic pain of left knee    2. Osteoarthritis of left knee,            This chart was generated using hospital voice-recognition software which does not employ spell-checking or grammar-checking features. It was dictated, all or in part, in a busy and often noisy patient care environment. I have taken all usual measures to dictate carefully and to review all aspects this chart. Nonetheless, given the known and well-documented performance characteristics of VR software in such patient care environments, this dictation still may contain unrecognized and wholly unintended errors or omissions  _______________________________           Truddie Hidden, PA  05/09/20 2028       Arvilla Meres, MD  05/09/20 2333

## 2020-05-09 NOTE — ED Notes (Signed)
Bed: [place]  Expected date: 05/09/20  Expected time:   Means of arrival: [facility]  Comments:  Medic 823-knee pain

## 2020-05-09 NOTE — ED Triage Notes (Signed)
Pt BIBA from home c/o left knee pain and discoloration. Pt states she has a history of knee pain and wants to get a knee replacement surgery, but her doctor told her she has to lose weight first but the pain has gotten progressively worse. Pt has not taken any pain meds today. Denies injury or trauma.

## 2020-05-09 NOTE — Discharge Instructions (Signed)
Degenerative Joint Disease     You have been diagnosed with degenerative joint disease (DJD).     Healthy joints have cartilage or soft bone that works like a cushion. When the cartilage wears down, DJD develops.      Symptoms of DJD include clicking or grinding in the joint or pain that gets worse when the joint is used. DJD can occur anywhere but it is most common in the knees, hips and shoulders.      Taking medication as prescribed will help control your pain.     YOU SHOULD SEEK MEDICAL ATTENTION IMMEDIATELY, EITHER HERE OR AT THE NEAREST EMERGENCY DEPARTMENT, IF ANY OF THE FOLLOWING OCCURS:  · The joint looks swollen or red.  · Your joint hurts too much to use. (For example, if your knee hurts too much to walk on it).  · You have fever (temperature higher than 100.4ºF / 38ºC), or shaking chills.

## 2020-10-06 ENCOUNTER — Inpatient Hospital Stay
Admission: EM | Admit: 2020-10-06 | Discharge: 2020-10-10 | DRG: 190 | Disposition: A | Discharge status: Home / Self Care | Payer: Medicaid Other | Attending: Internal Medicine | Admitting: Internal Medicine

## 2020-10-06 ENCOUNTER — Emergency Department: Payer: Medicaid Other

## 2020-10-06 DIAGNOSIS — I2510 Atherosclerotic heart disease of native coronary artery without angina pectoris: Secondary | ICD-10-CM | POA: Diagnosis present

## 2020-10-06 DIAGNOSIS — B349 Viral infection, unspecified: Secondary | ICD-10-CM | POA: Diagnosis present

## 2020-10-06 DIAGNOSIS — R7989 Other specified abnormal findings of blood chemistry: Secondary | ICD-10-CM | POA: Diagnosis not present

## 2020-10-06 DIAGNOSIS — G8929 Other chronic pain: Secondary | ICD-10-CM | POA: Diagnosis present

## 2020-10-06 DIAGNOSIS — I252 Old myocardial infarction: Secondary | ICD-10-CM

## 2020-10-06 DIAGNOSIS — G4733 Obstructive sleep apnea (adult) (pediatric): Secondary | ICD-10-CM | POA: Diagnosis present

## 2020-10-06 DIAGNOSIS — Z20822 Contact with and (suspected) exposure to covid-19: Secondary | ICD-10-CM | POA: Diagnosis present

## 2020-10-06 DIAGNOSIS — K219 Gastro-esophageal reflux disease without esophagitis: Secondary | ICD-10-CM | POA: Diagnosis present

## 2020-10-06 DIAGNOSIS — Z79899 Other long term (current) drug therapy: Secondary | ICD-10-CM

## 2020-10-06 DIAGNOSIS — Z6841 Body Mass Index (BMI) 40.0 and over, adult: Secondary | ICD-10-CM

## 2020-10-06 DIAGNOSIS — J441 Chronic obstructive pulmonary disease with (acute) exacerbation: Principal | ICD-10-CM | POA: Diagnosis present

## 2020-10-06 DIAGNOSIS — M159 Polyosteoarthritis, unspecified: Secondary | ICD-10-CM | POA: Diagnosis present

## 2020-10-06 DIAGNOSIS — J9621 Acute and chronic respiratory failure with hypoxia: Secondary | ICD-10-CM | POA: Diagnosis present

## 2020-10-06 DIAGNOSIS — F419 Anxiety disorder, unspecified: Secondary | ICD-10-CM | POA: Diagnosis present

## 2020-10-06 DIAGNOSIS — Z91041 Radiographic dye allergy status: Secondary | ICD-10-CM

## 2020-10-06 DIAGNOSIS — M549 Dorsalgia, unspecified: Secondary | ICD-10-CM | POA: Diagnosis present

## 2020-10-06 DIAGNOSIS — Z9119 Patient's noncompliance with other medical treatment and regimen: Secondary | ICD-10-CM

## 2020-10-06 DIAGNOSIS — J9801 Acute bronchospasm: Secondary | ICD-10-CM

## 2020-10-06 DIAGNOSIS — F17210 Nicotine dependence, cigarettes, uncomplicated: Secondary | ICD-10-CM | POA: Diagnosis present

## 2020-10-06 DIAGNOSIS — N3281 Overactive bladder: Secondary | ICD-10-CM | POA: Diagnosis present

## 2020-10-06 NOTE — ED Provider Notes (Signed)
EMERGENCY DEPARTMENT HISTORY AND PHYSICAL EXAM    Date: 10/06/2020  Patient Name: [name]    History of Presenting Illness     Chief Complaint   Patient presents with    Chest Pain    Shortness of Breath       History Provided By: pt    Chief Complaint:  cough  Onset: about a week  Timing: on-going  Location: chest  Quality: congested  Severity: moderate  Modifying Factors: pt uses advair and bronchodilators   Associated Symptoms:  Shortness of breath, cp when coughs    Additional History: [name] is a 49 y.o. female with morbid obesity, copd, MI,chronic back and knee pain says she was sent to the ED by her PCP for possible fluid on her lungs. Pt says she has had a cough for a week.  She has not been vaccinated for covid. She smokes. She denies fevers.     PCP: Virgel Bouquet, MD      Current Facility-Administered Medications   Medication Dose Route Frequency Provider Last Rate Last Admin    acetaminophen (TYLENOL) tablet 650 mg  650 mg Oral Q6H PRN Eveline Keto, PA        Or    acetaminophen (TYLENOL) suppository 650 mg  650 mg Rectal Q6H PRN Eveline Keto, PA        acetaminophen (TYLENOL) tablet 650 mg  650 mg Oral Once PRN Arvilla Meres, MD        acetaminophen (TYLENOL) tablet 650 mg  650 mg Oral Q6H Arvilla Meres, MD   650 mg at 10/07/20 1404    albuterol-ipratropium (DUO-NEB) 2.5-0.5(3) mg/3 mL nebulizer 3 mL  3 mL Nebulization Q6H PRN Morton Amy, MD        albuterol-ipratropium (DUO-NEB) 2.5-0.5(3) mg/3 mL nebulizer 3 mL  3 mL Nebulization Q6H [place] Morton Amy, MD        ALPRAZolam Duanne Moron) tablet 0.5 mg  0.5 mg Oral BID PRN Eveline Keto, PA        atomoxetine (STRATTERA) capsule 80 mg  80 mg Oral Daily Folkner, Curt Bears A, PA        benzonatate (TESSALON) capsule 200 mg  200 mg Oral TID PRN Eveline Keto, PA        calcium carbonate (TUMS) chewable tablet 500 mg  500 mg Oral 4X Daily PRN Emeline Darling A, PA        cefTRIAXone (ROCEPHIN) 1  g in sodium chloride 0.9 % 100 mL IVPB mini-bag plus  1 g Intravenous Q24H Emeline Darling A, PA 200 mL/hr at 10/07/20 0648 1 g at 10/07/20 0648    cyclobenzaprine (FLEXERIL) tablet 10 mg  10 mg Oral TID PRN Emeline Darling A, PA        dextrose (GLUCOSE) 40 % oral gel 15 g of glucose  15 g of glucose Oral PRN Emeline Darling A, PA        And    dextrose 50 % bolus 12.5 g  12.5 g Intravenous PRN Emeline Darling A, PA        And    glucagon (rDNA) (GLUCAGEN) injection 1 mg  1 mg Intramuscular PRN Folkner, Kathryn A, PA        fluticasone furoate-vilanterol (BREO ELLIPTA) 200-25 MCG/INH 1 puff  1 puff Inhalation QAM Folkner, Kathryn A, PA        gabapentin (NEURONTIN) capsule 800 mg  800 mg Oral Q8H Folkner, [place]al [place], [place]  800 mg at 10/07/20 1404    guaiFENesin (MUCINEX) 12 hr tablet 1,200 mg  1,200 mg Oral BID Eveline Keto, PA   1,200 mg at 10/07/20 0912    heparin (porcine) injection 5,000 Units  5,000 Units Subcutaneous Q8H [place] Eveline Keto, PA   5,000 Units at 10/07/20 1405    melatonin tablet 3 mg  3 mg Oral QHS PRN Emeline Darling A, PA        methylPREDNISolone sodium succinate (Solu-MEDROL) injection 40 mg  40 mg Intravenous Q8H Folkner, Kathryn A, PA   40 mg at 10/07/20 0911    naloxone (NARCAN) injection 0.2 mg  0.2 mg Intravenous PRN Emeline Darling A, PA        nicotine (NICODERM CQ) 14 MG/24HR patch 1 patch  1 patch Transdermal Daily Morton Amy, MD   1 patch at 10/07/20 0911    ondansetron (ZOFRAN-ODT) disintegrating tablet 4 mg  4 mg Oral Q6H PRN Eveline Keto, PA        Or    ondansetron (ZOFRAN) injection 4 mg  4 mg Intravenous Q6H PRN Eveline Keto, PA        oxybutynin (DITROPAN) tablet 10 mg  10 mg Oral TID Emeline Darling A, PA   10 mg at 10/07/20 1404    oxyCODONE (ROXICODONE) immediate release tablet 10 mg  10 mg Oral Q6H PRN Emeline Darling A, PA   10 mg at 10/07/20 1403    oxyCODONE (ROXICODONE) immediate release tablet 5 mg  5 mg Oral Q6H  PRN Emeline Darling A, PA        risperiDONE (RisperDAL) tablet 3 mg  3 mg Oral BID Emeline Darling A, PA   3 mg at 10/07/20 0912    sertraline (ZOLOFT) tablet 100 mg  100 mg Oral BID Eveline Keto, PA   100 mg at 10/07/20 8832       Past History     Past Medical History:  Past Medical History:   Diagnosis Date    Anxiety     Chronic obstructive pulmonary disease     Panic attacks        Past Surgical History:  Past Surgical History:   Procedure Laterality Date    APPENDECTOMY      ARTHROSCOPIC KNEE, ACL RECONSTRUCTION      R. knee     CHOLECYSTECTOMY         Family History:  Family History   Problem Relation Age of Onset    Heart attack Father     Diabetes Father     Asthma Sister     Diabetes Sister     Cancer Maternal Grandmother     Cancer Paternal Grandmother     Heart disease Paternal Grandmother     Diabetes Mother     Cancer Maternal Grandfather     Cancer Paternal Grandfather        Social History:  Social History     Tobacco Use    Smoking status: Current Every Day Smoker     Packs/day: 0.25     Years: 25.00     Pack years: 6.25     Types: Cigarettes     Last attempt to quit: 12/29/2018     Years since quitting: 1.7    Smokeless tobacco: Never Used   Vaping Use    Vaping Use: Never used   Substance Use Topics    Alcohol use: Yes     Comment: special occasion  Drug use: Yes     Types: Marijuana       Allergies:  Allergies   Allergen Reactions    Contrast [Iodinated Diagnostic Agents]        Review of Systems     Review of Systems   Constitutional: Negative for diaphoresis and fever.   HENT: Positive for congestion.    Eyes: Negative for discharge and redness.   Respiratory: Positive for cough and wheezing. Negative for stridor.    Cardiovascular: Negative for leg swelling.   Gastrointestinal: Negative for abdominal pain.   Musculoskeletal: Positive for joint pain (chronic knee pain).   Skin: Negative for itching.   Neurological: Negative for speech change.    Endo/Heme/Allergies: Does not bruise/bleed easily.   Psychiatric/Behavioral: Positive for substance abuse.     Physical Exam   BP 117/72    Pulse (!) 108    Temp 98.1 F (36.7 C) (Oral)    Resp 18    Ht _0  (1.727 m)    Wt 141.1 kg    SpO2 95%    BMI 47.29 kg/m   Physical Exam  Vitals reviewed.   Constitutional:       Appearance: She is obese. She is not toxic-appearing or diaphoretic.   HENT:      Head: Atraumatic.   Cardiovascular:      Rate and Rhythm: Normal rate and regular rhythm.   Pulmonary:      Breath sounds: Wheezing present. No rhonchi or rales.   Abdominal:      General: Bowel sounds are normal.      Palpations: Abdomen is soft.      Tenderness: There is no abdominal tenderness.   Musculoskeletal:         General: Normal range of motion.      Cervical back: Normal range of motion.      Right lower leg: No edema.      Left lower leg: No edema.   Skin:     General: Skin is warm and dry.   Neurological:      Mental Status: She is alert and oriented to person, place, and time.   Psychiatric:         Mood and Affect: Mood normal.         Behavior: Behavior normal.           Diagnostic Study Results     Labs -     Results     Procedure Component Value Units Date/Time    Troponin I [[id number]] Collected: 10/07/20 1447    Specimen: Blood Updated: 10/07/20 1520     Troponin I <0.01 ng/mL     COVID-19 (SARS-COV-2) ([facility] test) [[id number]] Collected: 10/07/20 0923    Specimen: Nasopharyngeal Swab from Nasopharynx Updated: 10/07/20 1356     Purpose of COVID testing Diagnostic -[facility]     SARS-CoV-2 Specimen Source Nasopharyngeal    Narrative:      o Collect and clearly label specimen type:  o PREFERRED-Upper respiratory specimen: One Nasopharyngeal  Swab in Transport Media.  o Hand deliver to laboratory ASAP  Diagnostic -[facility]I    Procalcitonin [[id number]] Collected: 10/07/20 0728     Updated: 10/07/20 1236     Procalcitonin 0.02    Troponin I [[id number]] Collected: 10/07/20 0728    Specimen: Blood  Updated: 10/07/20 0758     Troponin I <0.01 ng/mL     D-Dimer [[id number]] Collected: 10/07/20 0071     Updated: 10/07/20 2197  D-Dimer 0.45 ug/mL FEU     COVID-19 (SARS-COV-2) ([facility]) [[id number]] Collected: 10/07/20 0020    Specimen: Nasopharyngeal Swab from Nasopharynx Updated: 10/07/20 0045     Purpose of COVID testing Screening     SARS-CoV-2 Specimen Source Nasopharyngeal     SARS CoV 2 Overall Result Negative    Narrative:      o Collect and clearly label specimen type:  o Upper respiratory specimen: One Nasopharyngeal Dry Swab NO  Transport Media.  o Hand deliver to laboratory ASAP  Indication for testing->Extended care facility admission to  semi private room  Screening    B-type Natriuretic Peptide [[id number]] Collected: 10/07/20 0000    Specimen: Blood Updated: 10/07/20 0030     B-Natriuretic Peptide 12 pg/mL     Troponin I [[id number]] Collected: 10/07/20 0000    Specimen: Blood Updated: 10/07/20 0030     Troponin I <0.01 ng/mL     GFR [[id number]] Collected: 10/07/20 0000     Updated: 10/07/20 0028     EGFR >60.0    Comprehensive metabolic panel [[id number]]  (Abnormal) Collected: 10/07/20 0000    Specimen: Blood Updated: 10/07/20 0028     Glucose 115 mg/dL      BUN 15.0 mg/dL      Creatinine 0.7 mg/dL      Sodium 135 mEq/L      Potassium 3.8 mEq/L      Chloride 104 mEq/L      CO2 21 mEq/L      Calcium 9.7 mg/dL      Protein, Total 5.6 g/dL      Albumin 3.0 g/dL      AST (SGOT) 11 U/L      ALT 9 U/L      Alkaline Phosphatase 94 U/L      Bilirubin, Total 0.1 mg/dL      Globulin 2.6 g/dL      Albumin/Globulin Ratio 1.2     Anion Gap 10.0    Hemolysis index [[id number]]  (Abnormal) Collected: 10/07/20 0000     Updated: 10/07/20 0028     Hemolysis Index 24    CBC and differential [[id number]]  (Abnormal) Collected: 10/07/20 0000    Specimen: Blood Updated: 10/07/20 0011     WBC 12.10 x10 3/uL      Hgb 12.1 g/dL      Hematocrit 37.3 %      Platelets 250 x10 3/uL      RBC 4.20 x10 6/uL      MCV 88.8 fL       MCH 28.8 pg      MCHC 32.4 g/dL      RDW 13 %      MPV 10.5 fL      Neutrophils 64.1 %      Lymphocytes Automated 23.2 %      Monocytes 5.9 %      Eosinophils Automated 5.8 %      Basophils Automated 0.8 %      Immature Granulocytes 0.2 %      Nucleated RBC 0.0 /100 WBC      Neutrophils Absolute 7.75 x10 3/uL      Lymphocytes Absolute Automated 2.81 x10 3/uL      Monocytes Absolute Automated 0.71 x10 3/uL      Eosinophils Absolute Automated 0.70 x10 3/uL      Basophils Absolute Automated 0.10 x10 3/uL      Immature Granulocytes Absolute 0.03 x10 3/uL      Absolute NRBC 0.00 x10  3/uL           Radiologic Studies -   Radiology Results (24 Hour)     Procedure Component Value Units Date/Time    CT Chest WO Contrast [[id number]] Collected: 10/07/20 1421    Order Status: Completed Updated: 10/07/20 1429    Narrative:      INDICATION: Hypoxic respiratory failure    PROCEDURE: Multidetector CT scanning was performed through the chest  from the thoracic inlet to  the adrenal glands.  No IV contrast was  administered.  A combination of automatic exposure control and  adjustment  of the mA and/or kV according to patient size was utilized.    COMPARISON: 02/01/2019    FINDINGS: Ill-defined groundglass opacities are demonstrated lungs  bilaterally, most prominent within the upper lobes but also involving  the lower lobes. Upper lobe predominant chronic interstitial changes  with subpleural cystic and/or paraseptal emphysematous changes, and  bronchiectasis are noted and are similar to prior. No pneumothorax  demonstrated. No pleural effusions demonstrated. No suspicious lytic or  blastic lesions demonstrated. No suspicious mediastinal or axillary  lymph nodes demonstrated. No significant pericardial effusion  demonstrated. Gallbladder surgically absent. Colonic diverticulosis  noted.      Impression:         1. Groundglass opacities within the lungs bilaterally, nonspecific but  compatible with an infectious or inflammatory  process.    2. Upper lobe predominant chronic interstitial changes, similar to  prior.    [name], MD   10/07/2020 2:27 PM    Chest AP Portable [[id number]] Collected: 10/07/20 0024    Order Status: Completed Updated: 10/07/20 0028    Narrative:      Clinical History:  cp    Examination:  XR CHEST AP PORTABLE    Comparison:  Chest radiograph dated 11/24/2019    Findings:    There are mild congestive changes. There is no focal consolidation,  pleural effusion or pneumothorax. The heart is within normal limits in  size. The mediastinal contours are within normal limits. The osseous  structures and soft tissues are within normal limits.      Impression:          Mild congestive changes.    Timmie Foerster MD, MD   10/07/2020 12:26 AM      .      Medical Decision Making   I am the first provider for this patient.    Vital Signs-Reviewed the patient's vital signs.     Patient Vitals for the past 12 hrs:   BP Temp Pulse Resp   10/07/20 1258 -- -- (!) 108 18   10/07/20 1242 117/72 98.1 F (36.7 C) 100 18   10/07/20 0706 111/69 98.4 F (36.9 C) 94 19   10/07/20 0526 130/80 97.5 F (36.4 C) 92 16   10/07/20 0454 103/54 -- 85 16       Pulse Oximetry Analysis - 96 % on ra, nl       EKG:  Interpreted by the EP.   Time Interpreted:    Rate: 78   Rhythm: Normal Sinus Rhythm    Interpretation: no acute changes       ED Course: still with expiratory wheezes after duo-neb. She agrees with admission.           Provider Notes: d/w dr. Genia Harold who accepts.     .A. ------------Severe Sepsis/Septic Shock Exclusion---------------------------- Pt is excluded from severe sepsis or septic shock consideration at the time of admission  at 0216 [enter time] because of one or more reasons below: All SIRS, abnl vitals, organ dysfunction are NOT due to severe sepsis or septic shock, but due to alternative cause: Viral syndrome [list cause].       Diagnosis     Clinical Impression:   1. Acute bronchospasm due to viral infection         _______________________________    Attestations:  This note is prepared by Gay Filler, MD.     Gay Filler, MD.  I confirm that the note above accurately reflects all work, treatment, procedures, and medical decision making performed by me.    _______________________________       Herschel Senegal, MD  10/07/20 1626

## 2020-10-06 NOTE — ED Triage Notes (Signed)
Arrives by EMS, pt states she was at her doctor's office and was instructed to be evaluated for "fluid in my lungs". Pt reports SOB, CP and cough. Hx of COPD.

## 2020-10-06 NOTE — ED Notes (Signed)
Bed: PU32  Expected date:   Expected time:   Means of arrival:   Comments:  PG [phone]

## 2020-10-07 ENCOUNTER — Encounter: Payer: Self-pay | Admitting: Internal Medicine

## 2020-10-07 ENCOUNTER — Observation Stay: Payer: Medicaid Other

## 2020-10-07 DIAGNOSIS — B349 Viral infection, unspecified: Secondary | ICD-10-CM | POA: Diagnosis present

## 2020-10-07 LAB — GFR: EGFR: >60

## 2020-10-07 LAB — TROPONIN I
Troponin I: <0.01 ng/mL (ref 0.00–0.05)
Troponin I: <0.01 ng/mL (ref 0.00–0.05)
Troponin I: <0.01 ng/mL (ref 0.00–0.05)
Troponin I: <0.01 ng/mL (ref 0.00–0.05)
Troponin I: <0.01 ng/mL (ref 0.00–0.05)

## 2020-10-07 LAB — COMPREHENSIVE METABOLIC PANEL
ALT: 9 U/L (ref 0–55)
AST (SGOT): 11 U/L (ref 5–34)
Albumin/Globulin Ratio: 1.2 (ref 0.9–2.2)
Albumin: 3 g/dL — ABNORMAL LOW (ref 3.5–5.0)
Alkaline Phosphatase: 94 U/L (ref 37–106)
Anion Gap: 10 (ref 5.0–15.0)
BUN: 15 mg/dL (ref 7.0–19.0)
Bilirubin, Total: 0.1 mg/dL — ABNORMAL LOW (ref 0.2–1.2)
CO2: 21 mEq/L — ABNORMAL LOW (ref 22–29)
Calcium: 9.7 mg/dL (ref 8.5–10.5)
Chloride: 104 mEq/L (ref 100–111)
Creatinine: 0.7 mg/dL (ref 0.6–1.0)
Globulin: 2.6 g/dL (ref 2.0–3.6)
Glucose: 115 mg/dL — ABNORMAL HIGH (ref 70–100)
Potassium: 3.8 mEq/L (ref 3.5–5.1)
Protein, Total: 5.6 g/dL — ABNORMAL LOW (ref 6.0–8.3)
Sodium: 135 mEq/L — ABNORMAL LOW (ref 136–145)

## 2020-10-07 LAB — CBC AND DIFFERENTIAL
Absolute NRBC: 0 10*3/uL (ref 0.00–0.00)
Basophils Absolute Automated: 0.1 10*3/uL — ABNORMAL HIGH (ref 0.00–0.08)
Basophils Automated: 0.8 %
Eosinophils Absolute Automated: 0.7 10*3/uL — ABNORMAL HIGH (ref 0.00–0.44)
Eosinophils Automated: 5.8 %
Hematocrit: 37.3 % (ref 34.7–43.7)
Hgb: 12.1 g/dL (ref 11.4–14.8)
Immature Granulocytes Absolute: 0.03 10*3/uL (ref 0.00–0.07)
Immature Granulocytes: 0.2 %
Lymphocytes Absolute Automated: 2.81 10*3/uL (ref 0.42–3.22)
Lymphocytes Automated: 23.2 %
MCH: 28.8 pg (ref 25.1–33.5)
MCHC: 32.4 g/dL (ref 31.5–35.8)
MCV: 88.8 fL (ref 78.0–96.0)
MPV: 10.5 fL (ref 8.9–12.5)
Monocytes Absolute Automated: 0.71 10*3/uL (ref 0.21–0.85)
Monocytes: 5.9 %
Neutrophils Absolute: 7.75 10*3/uL — ABNORMAL HIGH (ref 1.10–6.33)
Neutrophils: 64.1 %
Nucleated RBC: 0 /100 WBC (ref 0.0–0.0)
Platelets: 250 10*3/uL (ref 142–346)
RBC: 4.2 10*6/uL (ref 3.90–5.10)
RDW: 13 % (ref 11–15)
WBC: 12.1 10*3/uL — ABNORMAL HIGH (ref 3.10–9.50)

## 2020-10-07 LAB — COVID-19 (SARS-COV-2)
SARS CoV 2 Overall Result: NEGATIVE
SARS CoV 2 Overall Result: NOT DETECTED

## 2020-10-07 LAB — B-TYPE NATRIURETIC PEPTIDE: B-Natriuretic Peptide: 12 pg/mL (ref 0–100)

## 2020-10-07 LAB — PROCALCITONIN: Procalcitonin: 0.02 (ref 0.00–0.10)

## 2020-10-07 LAB — IHS D-DIMER: D-Dimer: 0.45 ug/mL FEU (ref 0.00–0.60)

## 2020-10-07 LAB — HEMOLYSIS INDEX: Hemolysis Index: 24 — ABNORMAL HIGH (ref 0–18)

## 2020-10-07 MED ORDER — GUAIFENESIN ER 600 MG PO TB12
1200.0000 mg | ORAL_TABLET | Freq: Two times a day (BID) | ORAL | Status: DC
Start: 2020-10-07 — End: 2020-10-10
  Administered 2020-10-07 – 2020-10-10 (×8): 1200 mg via ORAL
  Filled 2020-10-07 (×8): qty 2

## 2020-10-07 MED ORDER — RISPERIDONE 1 MG PO TABS
3.0000 mg | ORAL_TABLET | Freq: Two times a day (BID) | ORAL | Status: DC
Start: 2020-10-07 — End: 2020-10-10
  Administered 2020-10-07 – 2020-10-10 (×7): 3 mg via ORAL
  Filled 2020-10-07 (×9): qty 3

## 2020-10-07 MED ORDER — HEPARIN SODIUM (PORCINE) 5000 UNIT/ML IJ SOLN
5000.0000 [IU] | Freq: Three times a day (TID) | INTRAMUSCULAR | Status: DC
Start: 2020-10-07 — End: 2020-10-10
  Administered 2020-10-07 – 2020-10-10 (×9): 5000 [IU] via SUBCUTANEOUS
  Filled 2020-10-07 (×10): qty 1

## 2020-10-07 MED ORDER — GLUCOSE 40 % PO GEL
15.0000 g | ORAL | Status: DC | PRN
Start: 2020-10-07 — End: 2020-10-10

## 2020-10-07 MED ORDER — CALCIUM CARBONATE ANTACID 500 MG PO CHEW
500.0000 mg | CHEWABLE_TABLET | Freq: Four times a day (QID) | ORAL | Status: DC | PRN
Start: 2020-10-07 — End: 2020-10-10
  Administered 2020-10-09 (×2): 500 mg via ORAL
  Filled 2020-10-07 (×2): qty 1

## 2020-10-07 MED ORDER — ONDANSETRON 4 MG PO TBDP
4.0000 mg | ORAL_TABLET | Freq: Four times a day (QID) | ORAL | Status: DC | PRN
Start: 2020-10-07 — End: 2020-10-10

## 2020-10-07 MED ORDER — ALBUTEROL-IPRATROPIUM 2.5-0.5 (3) MG/3ML IN SOLN
3.0000 mL | RESPIRATORY_TRACT | Status: DC
Start: 2020-10-07 — End: 2020-10-07
  Administered 2020-10-07 (×2): 3 mL via RESPIRATORY_TRACT
  Filled 2020-10-07 (×2): qty 3

## 2020-10-07 MED ORDER — OXYCODONE-ACETAMINOPHEN 5-325 MG PO TABS
1.0000 | ORAL_TABLET | ORAL | Status: DC | PRN
Start: 2020-10-07 — End: 2020-10-07
  Filled 2020-10-07: qty 1

## 2020-10-07 MED ORDER — ACETAMINOPHEN 325 MG PO TABS
650.0000 mg | ORAL_TABLET | Freq: Four times a day (QID) | ORAL | Status: AC
Start: 2020-10-07 — End: 2020-10-08
  Administered 2020-10-07 (×3): 650 mg via ORAL
  Filled 2020-10-07 (×3): qty 2

## 2020-10-07 MED ORDER — ALPRAZOLAM 0.5 MG PO TABS
0.5000 mg | ORAL_TABLET | Freq: Two times a day (BID) | ORAL | Status: DC | PRN
Start: 2020-10-07 — End: 2020-10-10
  Administered 2020-10-08 – 2020-10-10 (×2): 0.5 mg via ORAL
  Filled 2020-10-07 (×2): qty 1

## 2020-10-07 MED ORDER — OXYCODONE-ACETAMINOPHEN 5-325 MG PO TABS
2.0000 | ORAL_TABLET | Freq: Once | ORAL | Status: AC
Start: 2020-10-07 — End: 2020-10-07
  Administered 2020-10-07: 01:00:00 2 via ORAL
  Filled 2020-10-07: qty 2

## 2020-10-07 MED ORDER — FUROSEMIDE 10 MG/ML IJ SOLN
40.0000 mg | Freq: Once | INTRAMUSCULAR | Status: AC
Start: 2020-10-07 — End: 2020-10-07
  Administered 2020-10-07: 14:00:00 40 mg via INTRAVENOUS
  Filled 2020-10-07: qty 4

## 2020-10-07 MED ORDER — METHYLPREDNISOLONE SODIUM SUCC 40 MG IJ SOLR
40.0000 mg | Freq: Three times a day (TID) | INTRAMUSCULAR | Status: DC
Start: 2020-10-07 — End: 2020-10-07

## 2020-10-07 MED ORDER — IPRATROPIUM BROMIDE 0.02 % IN SOLN
0.5000 mg | Freq: Once | RESPIRATORY_TRACT | Status: AC
Start: 2020-10-07 — End: 2020-10-07
  Administered 2020-10-07: 01:00:00 0.5 mg via RESPIRATORY_TRACT
  Filled 2020-10-07: qty 2.5

## 2020-10-07 MED ORDER — OXYBUTYNIN CHLORIDE 5 MG PO TABS
10.0000 mg | ORAL_TABLET | Freq: Three times a day (TID) | ORAL | Status: DC
Start: 2020-10-07 — End: 2020-10-10
  Administered 2020-10-07 – 2020-10-10 (×11): 10 mg via ORAL
  Filled 2020-10-07 (×11): qty 2

## 2020-10-07 MED ORDER — ACETAMINOPHEN 650 MG RE SUPP
650.0000 mg | Freq: Four times a day (QID) | RECTAL | Status: DC | PRN
Start: 2020-10-07 — End: 2020-10-10

## 2020-10-07 MED ORDER — METHYLPREDNISOLONE SODIUM SUCC 40 MG IJ SOLR
40.0000 mg | Freq: Three times a day (TID) | INTRAMUSCULAR | Status: DC
Start: 2020-10-07 — End: 2020-10-10
  Administered 2020-10-07 – 2020-10-10 (×11): 40 mg via INTRAVENOUS
  Filled 2020-10-07 (×11): qty 1

## 2020-10-07 MED ORDER — DEXTROSE 50 % IV SOLN
12.5000 g | INTRAVENOUS | Status: DC | PRN
Start: 2020-10-07 — End: 2020-10-10

## 2020-10-07 MED ORDER — ACETAMINOPHEN 325 MG PO TABS
650.0000 mg | ORAL_TABLET | Freq: Once | ORAL | Status: DC | PRN
Start: 2020-10-07 — End: 2020-10-10

## 2020-10-07 MED ORDER — INDOMETHACIN 25 MG PO CAPS
50.0000 mg | ORAL_CAPSULE | Freq: Three times a day (TID) | ORAL | Status: DC
Start: 2020-10-07 — End: 2020-10-07
  Filled 2020-10-07 (×6): qty 2

## 2020-10-07 MED ORDER — ATOMOXETINE HCL 10 MG PO CAPS
80.0000 mg | ORAL_CAPSULE | Freq: Every day | ORAL | Status: DC
Start: 2020-10-07 — End: 2020-10-10

## 2020-10-07 MED ORDER — SODIUM CHLORIDE 0.9 % IV MBP
1.0000 g | INTRAVENOUS | Status: DC
Start: 2020-10-07 — End: 2020-10-10
  Administered 2020-10-07 – 2020-10-10 (×4): 1 g via INTRAVENOUS
  Filled 2020-10-07 (×4): qty 1000

## 2020-10-07 MED ORDER — GABAPENTIN 400 MG PO CAPS
800.0000 mg | ORAL_CAPSULE | Freq: Three times a day (TID) | ORAL | Status: DC
Start: 2020-10-07 — End: 2020-10-10
  Administered 2020-10-07 – 2020-10-10 (×11): 800 mg via ORAL
  Filled 2020-10-07 (×11): qty 2

## 2020-10-07 MED ORDER — METHYLPREDNISOLONE SODIUM SUCC 125 MG IJ SOLR
80.0000 mg | Freq: Once | INTRAMUSCULAR | Status: AC
Start: 2020-10-07 — End: 2020-10-07
  Administered 2020-10-07: 01:00:00 80 mg via INTRAVENOUS
  Filled 2020-10-07: qty 2

## 2020-10-07 MED ORDER — FUROSEMIDE 20 MG PO TABS
20.0000 mg | ORAL_TABLET | Freq: Two times a day (BID) | ORAL | Status: DC
Start: 2020-10-07 — End: 2020-10-07
  Administered 2020-10-07: 09:00:00 20 mg via ORAL
  Filled 2020-10-07: qty 1

## 2020-10-07 MED ORDER — SERTRALINE HCL 50 MG PO TABS
100.0000 mg | ORAL_TABLET | Freq: Two times a day (BID) | ORAL | Status: DC
Start: 2020-10-07 — End: 2020-10-10
  Administered 2020-10-07 – 2020-10-10 (×8): 100 mg via ORAL
  Filled 2020-10-07 (×8): qty 2

## 2020-10-07 MED ORDER — ALBUTEROL-IPRATROPIUM 2.5-0.5 (3) MG/3ML IN SOLN
3.0000 mL | Freq: Four times a day (QID) | RESPIRATORY_TRACT | Status: DC
Start: 2020-10-07 — End: 2020-10-07

## 2020-10-07 MED ORDER — FLUTICASONE FUROATE-VILANTEROL 200-25 MCG/INH IN AEPB
1.0000 | INHALATION_SPRAY | Freq: Every morning | RESPIRATORY_TRACT | Status: DC
Start: 2020-10-07 — End: 2020-10-10
  Administered 2020-10-08 – 2020-10-10 (×3): 1 via RESPIRATORY_TRACT
  Filled 2020-10-07: qty 14

## 2020-10-07 MED ORDER — SODIUM CHLORIDE 0.9 % IV SOLN
500.0000 mg | INTRAVENOUS | Status: DC
Start: 2020-10-07 — End: 2020-10-07
  Filled 2020-10-07: qty 500

## 2020-10-07 MED ORDER — ALBUTEROL-IPRATROPIUM 2.5-0.5 (3) MG/3ML IN SOLN
3.0000 mL | Freq: Four times a day (QID) | RESPIRATORY_TRACT | Status: DC
Start: 2020-10-07 — End: 2020-10-10
  Administered 2020-10-07 – 2020-10-10 (×9): 3 mL via RESPIRATORY_TRACT
  Filled 2020-10-07 (×8): qty 3

## 2020-10-07 MED ORDER — GLUCAGON 1 MG IJ SOLR (WRAP)
1.0000 mg | INTRAMUSCULAR | Status: DC | PRN
Start: 2020-10-07 — End: 2020-10-10

## 2020-10-07 MED ORDER — OXYCODONE HCL 5 MG PO TABS
10.0000 mg | ORAL_TABLET | Freq: Four times a day (QID) | ORAL | Status: DC | PRN
Start: 2020-10-07 — End: 2020-10-10
  Administered 2020-10-07 – 2020-10-10 (×13): 10 mg via ORAL
  Filled 2020-10-07 (×13): qty 2

## 2020-10-07 MED ORDER — ACETAMINOPHEN 325 MG PO TABS
650.0000 mg | ORAL_TABLET | Freq: Four times a day (QID) | ORAL | Status: DC | PRN
Start: 2020-10-07 — End: 2020-10-10

## 2020-10-07 MED ORDER — ONDANSETRON HCL 4 MG/2ML IJ SOLN
4.0000 mg | Freq: Four times a day (QID) | INTRAMUSCULAR | Status: DC | PRN
Start: 2020-10-07 — End: 2020-10-10

## 2020-10-07 MED ORDER — ALBUTEROL SULFATE (2.5 MG/3ML) 0.083% IN NEBU
5.0000 mg | INHALATION_SOLUTION | Freq: Once | RESPIRATORY_TRACT | Status: AC
Start: 2020-10-07 — End: 2020-10-07
  Administered 2020-10-07: 01:00:00 5 mg via RESPIRATORY_TRACT
  Filled 2020-10-07: qty 6

## 2020-10-07 MED ORDER — OXYCODONE HCL 5 MG PO TABS
5.0000 mg | ORAL_TABLET | Freq: Four times a day (QID) | ORAL | Status: DC | PRN
Start: 2020-10-07 — End: 2020-10-10
  Administered 2020-10-07 – 2020-10-08 (×2): 5 mg via ORAL
  Filled 2020-10-07 (×3): qty 1

## 2020-10-07 MED ORDER — NALOXONE HCL 0.4 MG/ML IJ SOLN (WRAP)
0.2000 mg | INTRAMUSCULAR | Status: DC | PRN
Start: 2020-10-07 — End: 2020-10-10

## 2020-10-07 MED ORDER — ONDANSETRON HCL 4 MG/2ML IJ SOLN
4.0000 mg | Freq: Three times a day (TID) | INTRAMUSCULAR | Status: DC | PRN
Start: 2020-10-07 — End: 2020-10-07

## 2020-10-07 MED ORDER — ALBUTEROL-IPRATROPIUM 2.5-0.5 (3) MG/3ML IN SOLN
3.0000 mL | Freq: Four times a day (QID) | RESPIRATORY_TRACT | Status: DC | PRN
Start: 2020-10-07 — End: 2020-10-10
  Administered 2020-10-09: 04:00:00 3 mL via RESPIRATORY_TRACT
  Filled 2020-10-07: qty 3

## 2020-10-07 MED ORDER — SODIUM CHLORIDE 0.9 % IV SOLN
500.0000 mg | INTRAVENOUS | Status: DC
Start: 2020-10-07 — End: 2020-10-10
  Administered 2020-10-07 – 2020-10-09 (×3): 500 mg via INTRAVENOUS
  Filled 2020-10-07 (×4): qty 500

## 2020-10-07 MED ORDER — CYCLOBENZAPRINE HCL 10 MG PO TABS
10.0000 mg | ORAL_TABLET | Freq: Three times a day (TID) | ORAL | Status: DC | PRN
Start: 2020-10-07 — End: 2020-10-10

## 2020-10-07 MED ORDER — MELATONIN 3 MG PO TABS
3.0000 mg | ORAL_TABLET | Freq: Every evening | ORAL | Status: DC | PRN
Start: 2020-10-07 — End: 2020-10-10

## 2020-10-07 MED ORDER — NICOTINE 14 MG/24HR TD PT24
1.0000 | MEDICATED_PATCH | Freq: Every day | TRANSDERMAL | Status: DC
Start: 2020-10-07 — End: 2020-10-10
  Administered 2020-10-07 – 2020-10-10 (×4): 1 via TRANSDERMAL
  Filled 2020-10-07 (×4): qty 1

## 2020-10-07 MED ORDER — ONDANSETRON HCL 4 MG/2ML IJ SOLN
4.0000 mg | Freq: Once | INTRAMUSCULAR | Status: DC | PRN
Start: 2020-10-07 — End: 2020-10-07

## 2020-10-07 MED ORDER — BENZONATATE 100 MG PO CAPS
200.0000 mg | ORAL_CAPSULE | Freq: Three times a day (TID) | ORAL | Status: DC | PRN
Start: 2020-10-07 — End: 2020-10-10

## 2020-10-07 NOTE — Plan of Care (Signed)
Problem: Safety  Goal: Patient will be free from injury during hospitalization  Outcome: Progressing  Goal: Patient will be free from infection during hospitalization  Outcome: Progressing     Problem: Pain  Goal: Pain at adequate level as identified by patient  Outcome: Progressing     Problem: Side Effects from Pain Analgesia  Goal: Patient will experience minimal side effects of analgesic therapy  Outcome: Progressing     Problem: Discharge Barriers  Goal: Patient will be discharged home or other facility with appropriate resources  Outcome: Progressing     Problem: Psychosocial and Spiritual Needs  Goal: Demonstrates ability to cope with hospitalization/illness  Outcome: Progressing     Problem: Moderate/High Fall Risk Score >5  Goal: Patient will remain free of falls  Outcome: Progressing

## 2020-10-07 NOTE — ED Notes (Signed)
[facility]  ED NURSING NOTE FOR THE RECEIVING INPATIENT NURSE   ED NURSE Wyvonna Plum [phone]   ED CHARGE RN Lysbeth Galas   ADMISSION INFORMATION   TISH BEGIN is a 49 y.o. female admitted with an ED diagnosis of:    1. Acute bronchospasm due to viral infection    2. Chronic obstructive pulmonary disease, unspecified COPD type         Isolation: None   Allergies: Contrast [iodinated diagnostic agents]   Holding Orders confirmed? Yes   Belongings Documented? Yes   Home medications sent to pharmacy confirmed? N/A   NURSING CARE   Patient Comes From:   Mental Status: Home Independent  alert and oriented   ADL: Needs assistance with ADLs   Ambulation: ambulates with: walker   Pertinent Information  and Safety Concerns: Pt p/w SOB and CP. was seen at PMD's office, was instructed to be evaluated in ED. pt able to use bedside comode with assistance. gets SOB with exertion.      CT / NIH   CT Head ordered on this patient?  N/A   NIH/Dysphagia assessment done prior to admission? N/A   VITAL SIGNS (at the time of this note)      Vitals:    10/07/20 0342   BP: 106/54   Pulse: 84   Resp: (!) 71   Temp: 98 F (36.7 C)   SpO2: 94%

## 2020-10-07 NOTE — H&P (Signed)
[facility]      Patient: [name]  Date: 10/06/2020   DOB: 10-11-1971  Admission Date: 10/06/2020   MRN: [id number]  Attending: Dr. Genia Harold, Eveline Keto, PA-C         Chief Complaint   Patient presents with    Chest Pain    Shortness of Breath      History Gathered From: patient and chart review.    HISTORY AND PHYSICAL     [name] is a 49 y.o. female with a PMHx of COPD, morbid obesity, chronic everyday smoker, and chronic back and knee pain who presented with cough x 1 week with associated sinus congestion, new productive cough with yellow sputum, occasional wheezing, and sternal chest pain with coughing. She reports having DOE at home due to her COPD which is unchanged. She has oxygen at home "if I need it" but does not check her O2 sats. In ED, rapid COVID test was negative. CXR with findings of "mild congestive changes without focal consolidation, pleural effusion, or pneumothorax". BNP 12 and trop I < 0.01. EKG in NSR. SpO2 96% on room air. Not vaccinated against COVID-19. Admitted for suspected COPD exacerbation.       Past Medical History:   Diagnosis Date    Anxiety     Chronic obstructive pulmonary disease     Panic attacks        Past Surgical History:   Procedure Laterality Date    APPENDECTOMY      ARTHROSCOPIC KNEE, ACL RECONSTRUCTION      R. knee     CHOLECYSTECTOMY         Prior to Admission medications    Medication Sig Start Date End Date Taking? Authorizing Provider   albuterol (PROVENTIL HFA;VENTOLIN HFA) 108 (90 Base) MCG/ACT inhaler Inhale 2 puffs into the lungs every 4 (four) hours as needed for Wheezing 02/04/19   Winfred, Carolan Clines, MD   albuterol-ipratropium (DUO-NEB) 2.5-0.5(3) mg/3 mL nebulizer Take 3 mLs by nebulization every 6 (six) hours as needed (for SOB or Wheezing) 11/29/19   Forest Becker, MD   ALPRAZolam Duanne Moron) 0.5 MG tablet Take 0.5 mg by mouth 2 (two) times daily as needed       [provider]   atomoxetine (STRATTERA) 10 MG  capsule Take 80 mg by mouth daily       [provider]   benzonatate (TESSALON) 200 MG capsule Take 1 capsule (200 mg total) by mouth 3 (three) times daily as needed for Cough 02/04/19   Winfred, Carolan Clines, MD   cyclobenzaprine (FLEXERIL) 10 MG tablet Take 1 tablet (10 mg total) by mouth 3 (three) times daily as needed for Muscle spasms 06/05/19   Harlene Salts, PA   famotidine (PEPCID) 20 MG tablet Take 1 tablet (20 mg total) by mouth every 12 (twelve) hours 02/04/19   Winfred, Carolan Clines, MD   fluticasone-salmeterol (ADVAIR DISKUS) 500-50 MCG/DOSE Aerosol Powder, Breath Activtivatede Inhale 1 puff into the lungs 2 (two) times daily 02/04/19   Winfred, Carolan Clines, MD   furosemide (LASIX) 20 MG tablet Take 1 tablet (20 mg total) by mouth 2 (two) times daily 11/29/19   Forest Becker, MD   gabapentin (NEURONTIN) 800 MG tablet Take 800 mg by mouth 3 (three) times daily    [provider]   guaiFENesin (MUCINEX) 600 MG 12 hr tablet Take 2 tablets (1,200 mg total) by mouth 2 (two) times daily 02/04/19   Winfred,  Carolan Clines, MD   indomethacin (INDOCIN) 50 MG capsule Take 1 capsule (50 mg total) by mouth 3 (three) times daily with meals 05/09/20   Mortiere, Juanda Bond, PA   lactobacillus/streptococcus (RISAQUAD) Cap Take 1 capsule by mouth daily 02/05/19   Winfred, Carolan Clines, MD   naloxone [facility]) 4 MG/0.1ML nasal spray 1 spray intranasally. If pt does not respond or relapses into respiratory depression call 911. Give additional doses every 2-3 min. 02/04/19   Winfred, Carolan Clines, MD   ondansetron (ZOFRAN-ODT) 4 MG disintegrating tablet Take 1 tablet (4 mg total) by mouth every 6 (six) hours as needed for Nausea 06/05/19   Harlene Salts, PA   oxybutynin (DITROPAN) 5 MG tablet Take 1 tablet (5 mg total) by mouth 3 (three) times daily 11/29/19   Forest Becker, MD   oxyCODONE-acetaminophen (PERCOCET) 10-325 MG per tablet Take 1 tablet by mouth every 4 (four) hours as needed for Pain 11/29/19   Forest Becker,  MD   risperiDONE (RISPERDAL) 3 MG tablet Take 3 mg by mouth 2 (two) times daily    [provider]   sertraline (ZOLOFT) 100 MG tablet Take 100 mg by mouth 2 (two) times daily       [provider]       Allergies   Allergen Reactions    Contrast [Iodinated Diagnostic Agents]        CODE STATUS: FULL    PRIMARY CARE MD: Virgel Bouquet, MD    Family History   Problem Relation Age of Onset    Heart attack Father     Diabetes Father     Asthma Sister     Diabetes Sister     Cancer Maternal Grandmother     Cancer Paternal Grandmother     Heart disease Paternal Grandmother     Diabetes Mother     Cancer Maternal Grandfather     Cancer Paternal Grandfather        Social History     Tobacco Use    Smoking status: Current Every Day Smoker     Packs/day: 0.25     Years: 25.00     Pack years: 6.25     Types: Cigarettes     Last attempt to quit: 12/29/2018     Years since quitting: 1.7    Smokeless tobacco: Never Used   Vaping Use    Vaping Use: Never used   Substance Use Topics    Alcohol use: Yes     Comment: special occasion    Drug use: Yes     Types: Marijuana       REVIEW OF SYSTEMS   Positive for: productive cough with yellow phlegm, wheeze, sinus congestion, chills, +BLE edema that improves when legs are elevated, chest pain with coughing  Negative for: sore throat, fevers, palpitations, shortness of breath at rest, abdominal pain, nausea, vomiting, diarrhea, abdominal swelling, difficulty urinating, dysuria, new myalgias or arthralgias, history of heart disease/MI/CAD, weight gain.  All ROS completed and otherwise negative.    PHYSICAL EXAM     Vital Signs (most recent): BP 130/80    Pulse 92    Temp 97.5 F (36.4 C) (Oral)    Resp 16    Ht 1.727 m (_0 )    Wt 141.1 kg (311 lb)    SpO2 94%    BMI 47.29 kg/m   Constitutional: well developed, obese female in NAD. Patient speaks freely in full sentences.   HEENT: NC/AT,  PERRL, no scleral icterus or conjunctival pallor  Neck: trachea  midline, supple, no cervical or supraclavicular lymphadenopathy or masses  Cardiovascular: RRR, normal S1 S2, no murmurs, gallops, palpable thrills, non-displaced PMI.  Respiratory: Normal rate. No retractions or increased work of breathing.Crackles in BLL, L>R.  Gastrointestinal: +BS, non-distended, soft, non-tender, no rebound or guarding.  Musculoskeletal: ROM and motor strength grossly normal. No clubbing or cyanosis. DP and radial pulses 2+ and symmetric. Mild nonpitting edema in BLE  Skin exam:  Warm, dry. No rashes or wounds on exposed surfaces.  Neurologic: EOM grossly intact, CN 2-12 grossly intact. no gross motor or sensory deficits  Psychiatric: AAOx3, flat affect. mood appropriate. The patient is alert, interactive, appropriate.  Capillary refill:  Brisk, < 2 seconds bilaterally.       LABS & IMAGING     Recent Results (from the past 24 hour(s))   CBC and differential    Collection Time: 10/07/20 12:00 AM   Result Value Ref Range    WBC 12.10 (H) 3.10 - 9.50 x10 3/uL    Hgb 12.1 11.4 - 14.8 g/dL    Hematocrit 37.3 34.7 - 43.7 %    Platelets 250 142 - 346 x10 3/uL    RBC 4.20 3.90 - 5.10 x10 6/uL    MCV 88.8 78.0 - 96.0 fL    MCH 28.8 25.1 - 33.5 pg    MCHC 32.4 31.5 - 35.8 g/dL    RDW 13 11 - 15 %    MPV 10.5 8.9 - 12.5 fL    Neutrophils 64.1 None %    Lymphocytes Automated 23.2 None %    Monocytes 5.9 None %    Eosinophils Automated 5.8 None %    Basophils Automated 0.8 None %    Immature Granulocytes 0.2 None %    Nucleated RBC 0.0 0.0 - 0.0 /100 WBC    Neutrophils Absolute 7.75 (H) 1.10 - 6.33 x10 3/uL    Lymphocytes Absolute Automated 2.81 0.42 - 3.22 x10 3/uL    Monocytes Absolute Automated 0.71 0.21 - 0.85 x10 3/uL    Eosinophils Absolute Automated 0.70 (H) 0.00 - 0.44 x10 3/uL    Basophils Absolute Automated 0.10 (H) 0.00 - 0.08 x10 3/uL    Immature Granulocytes Absolute 0.03 0.00 - 0.07 x10 3/uL    Absolute NRBC 0.00 0.00 - 0.00 x10 3/uL   Comprehensive metabolic panel    Collection Time:  10/07/20 12:00 AM   Result Value Ref Range    Glucose 115 (H) 70 - 100 mg/dL    BUN 15.0 7.0 - 19.0 mg/dL    Creatinine 0.7 0.6 - 1.0 mg/dL    Sodium 135 (L) 136 - 145 mEq/L    Potassium 3.8 3.5 - 5.1 mEq/L    Chloride 104 100 - 111 mEq/L    CO2 21 (L) 22 - 29 mEq/L    Calcium 9.7 8.5 - 10.5 mg/dL    Protein, Total 5.6 (L) 6.0 - 8.3 g/dL    Albumin 3.0 (L) 3.5 - 5.0 g/dL    AST (SGOT) 11 5 - 34 U/L    ALT 9 0 - 55 U/L    Alkaline Phosphatase 94 37 - 106 U/L    Bilirubin, Total 0.1 (L) 0.2 - 1.2 mg/dL    Globulin 2.6 2.0 - 3.6 g/dL    Albumin/Globulin Ratio 1.2 0.9 - 2.2    Anion Gap 10.0 5.0 - 15.0   Troponin I    Collection Time: 10/07/20 12:00  AM   Result Value Ref Range    Troponin I <0.01 0.00 - 0.05 ng/mL   B-type Natriuretic Peptide    Collection Time: 10/07/20 12:00 AM   Result Value Ref Range    B-Natriuretic Peptide 12 0 - 100 pg/mL   Hemolysis index    Collection Time: 10/07/20 12:00 AM   Result Value Ref Range    Hemolysis Index 24 (H) 0 - 18   GFR    Collection Time: 10/07/20 12:00 AM   Result Value Ref Range    EGFR >60.0    COVID-19 (SARS-COV-2) ([place] [facility])    Collection Time: 10/07/20 12:20 AM    Specimen: Nasopharynx; Nasopharyngeal Swab   Result Value Ref Range    Purpose of COVID testing Screening     SARS-CoV-2 Specimen Source Nasopharyngeal     SARS CoV 2 Overall Result Negative        MICROBIOLOGY:  Blood Culture: none  Urine Culture: none  Antibiotics Started: none    IMAGING:  Chest AP Portable    Result Date: 10/07/2020  Mild congestive changes. Timmie Foerster MD, MD  10/07/2020 12:26 AM        CARDIAC:  EKG Interpretation (upon my review):  NSR, HR 78 bpm, No ST elevation    Markers:  Recent Labs   Lab 10/07/20  0000   Troponin I <0.01       EMERGENCY DEPARTMENT COURSE:  Orders Placed This Encounter   Procedures    COVID-19 (SARS-COV-2) ([place] [facility])    COVID-19 (SARS-COV-2) ([place] Standard test)    CULTURE + GRAM STAIN,AEROBIC,RESPIRATORY    Chest AP Portable    CBC and differential     Comprehensive metabolic panel    Troponin I    B-type Natriuretic Peptide    Hemolysis index    GFR    Troponin I    Procalcitonin    D-Dimer    Troponin I    Basic Metabolic Panel    CBC without differential    Diet regular    Cardiac Monitoring    ED Holding Orders Expire in 12 Hours    Notify Admitting Attending ( Change in Condition)    Notify Attending of Patient Arrival to Floor within 12 Hours    Notify Physician (Vital Signs)    Notify Physician (Lab Results)    Vital Signs Q4HR    Activity as Tolerated    Notify Admitting Physician:Chest Pain/EKG    Notify Admitting Physician:Change in Condition/Abnormal VS    Eye protection shoud be worn for all instances of  patient contact    Lab draws/medication administration should be bundled to minimize exposure risks    Vital signs    Pulse Oximetry    Progressive Mobility Protocol    Notify physician    NSG Communication: Glucose POCT order (PRN hypoglycemia)    I/O    Height    Weight    Skin assessment    Nursing communication: Adult Hypoglycemia Treatment Algorithm    Place sequential compression device    Maintain sequential compression device    Education: Activity    Education: Disease Process & Condition    Education: Pain Management    Education: Falls Risk    Education: Smoking Cessation    Telemetry 24 Hour Protocol    Full Code    ED Clerk Communication Order    Contact isolation    Airborne isolation for COVID-19 with negative pressure    Isolation Patient (RT  Use only)    MDI/DPI    ECG 12 Lead    Saline lock IV    Admit to Observation       ASSESSMENT & PLAN     LATHA STAUNTON is a 49 y.o. female admitted under observation with Acute bronchospasm due to viral infection.    Patient Active Hospital Problem List:    COPD exacerbaction   Acute bronchospasm due to viral infection  Pleuritic chest pain  Cough and congestion  - Rapid COVID negative. Patient is non-vaccinated. Standard PCR ordered.  -  CXR with congestive changes.  - BNP 12.  - PCT and d-dimer ordered.  - Monitor on telemetry and trend troponins.   - Sputum culture ordered.  - Start Rocephin with new productive cough.   - Start IV steroids and Duonebs.  - Continue supportive therapies with mucinex and tessalon perles.    Tobacco abuse  - Encourage cessation      Chronic back and knee pain - continue home percocet, indomethacin, gabapentin, and PRN Flexeril  Mood disorder/anxiety/depression -  Continue home Strattera, PRN alprazolam, risperidone, and sertraline  GERD - PRN Tums ordered  Morbid obesity, BMI 47.29.        Nutrition  Regular diet     DVT/VTE Prophylaxis  SCD's, heparin SQ    Anticipated medical stability for discharge: 24-48 hours    Service status/Reason for ongoing hospitalization: COPD exacerbation  Anticipated Discharge Needs: TBD    Signed,  Eveline Keto, PA-C    10/07/2020 6:11 AM

## 2020-10-07 NOTE — UM Notes (Signed)
Primary Coverage: MEDICAID HMO/[employer]      10/07/20 0216    Admit to Observation  Once       Diagnosis: Acute Bronchospasm Due To Viral Infection   Level of Care: Acute   Patient Class: Observation      49 y.o. female with a PMHx of COPD, morbid obesity, chronic everyday smoker, and chronic back and knee pain  presented with cough x 1 week with associated sinus congestion, new productive cough with yellow sputum, occasional wheezing, and sternal chest pain with coughing. She reports having DOE at home due to her COPD which is unchanged. She has oxygen at home "if I need it" but does not check her O2 sats. In ED, rapid COVID test was negative. CXR with findings of "mild congestive changes without focal consolidation, pleural effusion, or pneumothorax". BNP 12 and trop I < 0.01. EKG in NSR. SpO2 96% on room air. Not vaccinated against COVID-19. Admitted for suspected COPD exacerbation.     Positive for: productive cough with yellow phlegm, wheeze, sinus congestion, chills, +BLE edema that improves when legs are elevated, chest pain with coughing    10/06/20 2311 98.4 F (36.9 C) 79 96 % 16 118/58     Labs: WBC 12.10. glucose 115.  Na 135. CO 21, protein, total 5.6, albumin 3.0. bli, total 0.1.     CXR:  Mild congestive changes.     ER Medication Administration from 10/06/2020 2250 to 10/07/2020 0511     Date/Time Order Dose Route Action    10/07/2020 0059 oxyCODONE-acetaminophen (PERCOCET) 5-325 MG per tablet 2 tablet 2 tablet Oral Given    10/07/2020 0100 methylPREDNISolone sodium succinate (Solu-MEDROL) injection 80 mg 80 mg Intravenous Given    10/07/2020 0117 albuterol (PROVENTIL) (2.5 MG/3ML) 0.083% nebulizer solution 5 mg 5 mg Nebulization Given    10/07/2020 0117 ipratropium (ATROVENT) 0.02 % nebulizer solution 0.5 mg 0.5 mg Nebulization Given     Assessment and Plan  COPD exacerbaction   Acute bronchospasm due to viral infection  Pleuritic chest pain  Cough and congestion  - Rapid  COVID negative. Patient is non-vaccinated. Standard PCR ordered.  - CXR with congestive changes.  - BNP 12.  - PCT and d-dimer ordered.  - Monitor on telemetry and trend troponins.   - Sputum culture ordered.  - Start Rocephin with new productive cough.   - Start IV steroids and Duonebs.  - Continue supportive therapies with mucinex and tessalon perles.    Tobacco abuse  - Encourage cessation    Chronic back and knee pain - continue home percocet, indomethacin, gabapentin, and PRN Flexeril  Mood disorder/anxiety/depression -  Continue home Strattera, PRN alprazolam, risperidone, and sertraline  GERD - PRN Tums ordered  Morbid obesity, BMI 47.29.    Nutrition  Regular diet     DVT/VTE Prophylaxis  SCD's, heparin SQ    Anticipated medical stability for discharge: 24-48 hours    Service status/Reason for ongoing hospitalization: COPD exacerbation  Anticipated Discharge Needs: TBD    Scheduled Meds:  Current Facility-Administered Medications   Medication Dose Route Frequency    acetaminophen  650 mg Oral Q6H    albuterol-ipratropium  3 mL Nebulization Q4H SCH    atomoxetine  80 mg Oral Daily    cefTRIAXone  1 g Intravenous Q24H    fluticasone furoate-vilanterol  1 puff Inhalation QAM    furosemide  20 mg Oral BID    gabapentin  800 mg Oral Q8H    guaiFENesin  1,200 mg Oral BID    heparin (porcine)  5,000 Units Subcutaneous Q8H [place]    indomethacin  50 mg Oral TID MEALS    methylPREDNISolone  40 mg Intravenous Q8H    nicotine  1 patch Transdermal Daily    oxybutynin  10 mg Oral TID    risperiDONE  3 mg Oral BID    sertraline  100 mg Oral BID     PRN Meds:.acetaminophen **OR** acetaminophen, acetaminophen, ALPRAZolam, benzonatate, calcium carbonate, cyclobenzaprine, Nursing communication: Adult Hypoglycemia Treatment Algorithm **AND** dextrose **AND** dextrose **AND** glucagon (rDNA), melatonin, naloxone, ondansetron **OR** ondansetron, oxyCODONE, oxyCODONE

## 2020-10-07 NOTE — Respiratory Progress Note (Cosign Needed)
Respiratory Therapy Patient Assessment    A2328/A2328.01  10/07/20 1:16 PM  RT: First M Last, RT      Admitting DX: Acute bronchospasm due to viral infection [J98.01, B34.9]  Chronic obstructive pulmonary disease, unspecified COPD type [J44.9]    Pulmonary History: COPD    Other Pulm Hx:      Therapy ordered:       Respiratory Orders   (From admission, onward)             Start     Ordered    10/07/20 0900  MDI/DPI  Every morning (RT)      Comments:   All Adult patients ordered for Respiratory Therapy, i.e., inhaled meds, secretion clearance/lung expansion or Oxygen greater than 5 liters/min will be evaluated by a Respiratory Therapist and assessed per Respiratory Therapy Patient Driven Protocol.  Initial assessment and changes made per protocol can be found in the progress note section of the patient chart.    10/07/20 0555    10/07/20 0800  Nebulizer treatment intermittent  Every 4 hours scheduled (RT)      Comments:   All Adult patients ordered for Respiratory Therapy, i.e., inhaled meds, secretion clearance/lung expansion or Oxygen greater than 5 liters/min will be evaluated by a Respiratory Therapist and assessed per Respiratory Therapy Patient Driven Protocol.  Initial assessment and changes made per protocol can be found in the progress note section of the patient chart.    10/07/20 2707    10/07/20 0552  Isolation Patient (RT Use only)  Every 4 hours scheduled (RT)       10/07/20 0555               IP Meds - Nasal and Inhaled (From admission, onward)            Start     Stop Status Route Frequency Ordered    10/07/20 0900  fluticasone furoate-vilanterol (BREO ELLIPTA) 200-25 MCG/INH 1 puff         -- Dispensed IN RT - Every morning 10/07/20 0548    10/07/20 0800  albuterol-ipratropium (DUO-NEB) 2.5-0.5(3) mg/3 mL nebulizer 3 mL  (COPD Bronchodilators)         -- Dispensed NEBULIZATION RT - Every 4 hours scheduled 10/07/20 0552             PT able to take deep breath? yes            Surgical Status: None   Airway: Natural   Mobility: Ambulatory with or without assistance  CXR: Mild congestive    Cough Effort: can cough            Can clear secretions with cough? yes  Can clear secretions with suctioning? NA     Social History     Tobacco Use   Smoking Status Current Every Day Smoker    Packs/day: 0.25    Years: 25.00    Pack years: 6.25    Types: Cigarettes    Last attempt to quit: 12/29/2018    Years since quitting: 1.7   Smokeless Tobacco Never Used        Breath Sounds:  Bilateral Breath Sounds: Diminished,Expiratory wheezes  R Breath Sounds: Diminished  L Breath Sounds: Diminished    Heart Rate: (!) 108 Resp Rate: 18  SpO2: 95 % O2 Device: Nasal cannula  FiO2: 28 %  O2 Flow Rate (L/min): 2 L/min    Home regimen:  Home Treatments:  Albuterol inhaler,Advair BID and DUONEB as needed  Home Oxygen: 02 2L as needed   Home CPAP/BiLevel: CPAP    Criteria for therapy:  Secretion Clearance: None indicated  Lung Expansion: None indicated  Medications: Hx of COPD, Asthma, Bronchitis or other documented RAD    Recommendations/Interventions:  Recommendations/Interventions: Bronchodilators     Expected Outcomes:        Meds: Return to baseline home regimen,Decreased freq of symptoms      Re-Evaluation:  Follow-up Date: 10/08/20  Improving with Therapy: NA    Plan of Care Recommendations:  Plan of Care: Based on my assessment,tx canged Q6WA.

## 2020-10-07 NOTE — Consults (Signed)
MIDLINE INSERTION PROCEDURE     Deyton,[name]  10/07/2020    INDICATIONS: Therapy less than 28 days    The midline procedure, risks, benefits were discussed with thepatient.  All questions were answered  patient verbalized understanding and agreed to proceed. Midline education/instructions provided to patient.    PROCEDURE DETAILS:   The patient was positioned and Ultrasound was used to confirm patency of the Right Cephalic vein prior to obtaining venous access. The arm was scrubbed with 2% chlorhexidine per guidelines and a maximal sterile field was established for the patient.  The clinician was attired with cap, mask and sterile gown/gloves prior to start.     ARROW POWER SL Midline:  A sterile cover was sheathed to the Ultrasound probe.  The vein was then revisualized and 1% lidocaine injected prior to puncture of the Right Cephalic vein with a [id number] single-wall needle under direct sonographic guidance.  The guidewire was advanced through the needle, the needle was removed and a peel-away sheath was placed over the wire.  After measuring from the insertion site to the midline of the upper arm the catheter was trimmed to insure tip location below the axillary.  The catheter was then advanced through the peel-away sheath.  The sheath was removed.  The catheter was flushed with normal saline to confirm brisk blood return and capped.  The catheter was stabilized on the skin using a securement device.  Antimicrobial disk and sterile transparent occlusive dressing applied using aseptic technique.    Patient did tolerate the procedure well.   Catheter Type: ARROW  Insertion Site: Right Cephalic vein  Total length: 15 CM  Internal Length:  10 cm  External Length: 5 cm  UAC: 33 cm    Midline Reference #: [id number]  Midline Kit Lot#: [id number]  Midline Kit expiration date: 2021-11-19    Findings/Conclusions:  No signs of bleeding or symptoms of nerve irritation noted at time of insertion procedure.    Tip  location is below the level of the axilla in Cephalic vein. Midline is ready for immediate use.    Midline is ready for immediate use    Hsiu-I Maryan Char, RN

## 2020-10-08 ENCOUNTER — Inpatient Hospital Stay (HOSPITAL_COMMUNITY): Payer: Medicaid Other

## 2020-10-08 DIAGNOSIS — Z136 Encounter for screening for cardiovascular disorders: Secondary | ICD-10-CM

## 2020-10-08 DIAGNOSIS — B349 Viral infection, unspecified: Secondary | ICD-10-CM

## 2020-10-08 DIAGNOSIS — J9801 Acute bronchospasm: Secondary | ICD-10-CM

## 2020-10-08 LAB — BLOOD GAS, ARTERIAL
Arterial Total CO2: 50.8 mEq/L — ABNORMAL HIGH (ref 24.0–30.0)
Base Excess, Arterial: -0.6 mEq/L (ref <=2.0)
HCO3, Arterial: 24.8 mEq/L (ref 23.0–29.0)
O2 Sat, Arterial: 93.6 % — ABNORMAL LOW (ref 95.0–100.0)
Temperature: 37
pCO2, Arterial: 46 mmHg — ABNORMAL HIGH (ref 35.0–45.0)
pH, Arterial: 7.35 (ref 7.350–7.450)
pO2, Arterial: 66.1 mmHg — ABNORMAL LOW (ref 80.0–90.0)

## 2020-10-08 LAB — BASIC METABOLIC PANEL
Anion Gap: 7 (ref 5.0–15.0)
BUN: 14 mg/dL (ref 7.0–19.0)
CO2: 23 mEq/L (ref 22–29)
Calcium: 10 mg/dL (ref 8.5–10.5)
Chloride: 108 mEq/L (ref 100–111)
Creatinine: 0.7 mg/dL (ref 0.6–1.0)
Glucose: 121 mg/dL — ABNORMAL HIGH (ref 70–100)
Potassium: 4.9 mEq/L (ref 3.5–5.1)
Sodium: 138 mEq/L (ref 136–145)

## 2020-10-08 LAB — CBC
Absolute NRBC: 0 10*3/uL (ref 0.00–0.00)
Hematocrit: 36.9 % (ref 34.7–43.7)
Hgb: 11.7 g/dL (ref 11.4–14.8)
MCH: 29.1 pg (ref 25.1–33.5)
MCHC: 31.7 g/dL (ref 31.5–35.8)
MCV: 91.8 fL (ref 78.0–96.0)
MPV: 11.2 fL (ref 8.9–12.5)
Nucleated RBC: 0 /100 WBC (ref 0.0–0.0)
Platelets: 237 10*3/uL (ref 142–346)
RBC: 4.02 10*6/uL (ref 3.90–5.10)
RDW: 14 % (ref 11–15)
WBC: 18.94 10*3/uL — ABNORMAL HIGH (ref 3.10–9.50)

## 2020-10-08 LAB — GFR: EGFR: >60

## 2020-10-08 LAB — B-TYPE NATRIURETIC PEPTIDE: B-Natriuretic Peptide: 170 pg/mL — ABNORMAL HIGH (ref 0–100)

## 2020-10-08 LAB — HEMOLYSIS INDEX: Hemolysis Index: 20 — ABNORMAL HIGH (ref 0–18)

## 2020-10-08 LAB — TROPONIN I: Troponin I: <0.01 ng/mL (ref 0.00–0.05)

## 2020-10-08 MED ORDER — FUROSEMIDE 10 MG/ML IJ SOLN
40.0000 mg | Freq: Every day | INTRAMUSCULAR | Status: DC
Start: 2020-10-08 — End: 2020-10-10
  Administered 2020-10-08 – 2020-10-10 (×3): 40 mg via INTRAVENOUS
  Filled 2020-10-08 (×3): qty 4

## 2020-10-08 NOTE — Consults (Signed)
PULM / CCM CONSULTATION    [phone]    Date Time: 10/08/20 2:55 PM  Patient Name: [name]  Requesting Physician: Morton Amy, MD      Reason for Consultation:     Acute exacerbation of chronic obstructive pulmonary disease    Assessment:     Acute exacerbation of chronic obstructive pulmonary disease  Asthmatic bronchitis  Acute hypoxic respiratory failure  Bilateral groundglass opacity consistent with multifocal pneumonia  Rule out Covid  Obstructive sleep apnea  Morbid obesity      Plan:   Panculture  Covid test  Broad-spectrum antibiotic patient is currently on ceftriaxone and azithromycin  Bronchodilators in  maximal doses  Systemic corticosteroids  Tobacco cessation counseling  Respiratory therapy consult for education  Continue inhaled corticosteroid    History:         [name] is a 49 y.o. female who presents to the hospital on 10/06/2020 with progressive shortness of breath cough and chest congestion.  Patient has history of COPD.  She is every day smoker about 1 pack/day.  Her symptoms started about a week ago with sinus congestion started having productive cough wheezing chest congestion.  She is on home oxygen but she is not using it regularly.  Patient underwent CT of the chest which showed groundglass opacity bilaterally.  Patient is being ruled out for Covid.        Past Medical History:     Past Medical History:   Diagnosis Date    Anxiety     Chronic obstructive pulmonary disease     Panic attacks        Past Surgical History:     Past Surgical History:   Procedure Laterality Date    APPENDECTOMY      ARTHROSCOPIC KNEE, ACL RECONSTRUCTION      R. knee     CHOLECYSTECTOMY         Family History:     Family History   Problem Relation Age of Onset    Heart attack Father     Diabetes Father     Asthma Sister     Diabetes Sister     Cancer Maternal Grandmother     Cancer Paternal Grandmother     Heart disease Paternal Grandmother     Diabetes Mother     Cancer  Maternal Grandfather     Cancer Paternal Grandfather        Social History:     Social History     Socioeconomic History    Marital status: Single     Spouse name: Not on file    Number of children: Not on file    Years of education: Not on file    Highest education level: Not on file   Occupational History    Not on file   Tobacco Use    Smoking status: Current Every Day Smoker     Packs/day: 0.25     Years: 25.00     Pack years: 6.25     Types: Cigarettes     Last attempt to quit: 12/29/2018     Years since quitting: 1.7    Smokeless tobacco: Never Used   Vaping Use    Vaping Use: Never used   Substance and Sexual Activity    Alcohol use: Yes     Comment: special occasion    Drug use: Yes     Types: Marijuana    Sexual activity: Yes  Partners: Male     Birth control/protection: None     Comment: rarely   Other Topics Concern    Not on file   Social History Narrative    Not on file     Social Determinants of Health     Financial Resource Strain:     Difficulty of Paying Living Expenses: Not on file   Food Insecurity:     Worried About Charity fundraiser in the Last Year: Not on file    [employer] of Food in the Last Year: Not on file   Transportation Needs:     Lack of Transportation (Medical): Not on file    Lack of Transportation (Non-Medical): Not on file   Physical Activity:     Days of Exercise per Week: Not on file    Minutes of Exercise per Session: Not on file   Stress:     Feeling of Stress : Not on file   Social Connections:     Frequency of Communication with Friends and Family: Not on file    Frequency of Social Gatherings with Friends and Family: Not on file    Attends Religious Services: Not on file    Active Member of Clubs or Organizations: Not on file    Attends Archivist Meetings: Not on file    Marital Status: Not on file   Intimate Partner Violence:     Fear of Current or Ex-Partner: Not on file    Emotionally Abused: Not on file    Physically Abused: Not  on file    Sexually Abused: Not on file   Housing Stability:     Unable to Pay for Housing in the Last Year: Not on file    Number of [place] in the Last Year: Not on file    Unstable Housing in the Last Year: Not on file       Allergies:     Allergies   Allergen Reactions    Contrast [Iodinated Diagnostic Agents]        Hospital Medications:     Current Facility-Administered Medications   Medication Dose Route Frequency    albuterol-ipratropium  3 mL Nebulization Q6H WA    atomoxetine  80 mg Oral Daily    azithromycin  500 mg Intravenous Q24H    cefTRIAXone  1 g Intravenous Q24H    fluticasone furoate-vilanterol  1 puff Inhalation QAM    furosemide  40 mg Intravenous Daily    gabapentin  800 mg Oral Q8H    guaiFENesin  1,200 mg Oral BID    heparin (porcine)  5,000 Units Subcutaneous Q8H [place]    methylPREDNISolone  40 mg Intravenous Q8H    nicotine  1 patch Transdermal Daily    oxybutynin  10 mg Oral TID    risperiDONE  3 mg Oral BID    sertraline  100 mg Oral BID       Home Medications:     Medications Prior to Admission   Medication Sig Dispense Refill Last Dose    albuterol (PROVENTIL HFA;VENTOLIN HFA) 108 (90 Base) MCG/ACT inhaler Inhale 2 puffs into the lungs every 4 (four) hours as needed for Wheezing 1 Inhaler 0     albuterol-ipratropium (DUO-NEB) 2.5-0.5(3) mg/3 mL nebulizer Take 3 mLs by nebulization every 6 (six) hours as needed (for SOB or Wheezing) 90 mL 0     ALPRAZolam (XANAX) 0.5 MG tablet Take 0.5 mg by mouth 2 (two) times daily as  needed          atomoxetine (STRATTERA) 10 MG capsule Take 80 mg by mouth daily          benzonatate (TESSALON) 200 MG capsule Take 1 capsule (200 mg total) by mouth 3 (three) times daily as needed for Cough 30 capsule 0     cyclobenzaprine (FLEXERIL) 10 MG tablet Take 1 tablet (10 mg total) by mouth 3 (three) times daily as needed for Muscle spasms 6 tablet 0     famotidine (PEPCID) 20 MG tablet Take 1 tablet (20 mg total) by mouth every 12  (twelve) hours 60 tablet 0     fluticasone-salmeterol (ADVAIR DISKUS) 500-50 MCG/DOSE Aerosol Powder, Breath Activtivatede Inhale 1 puff into the lungs 2 (two) times daily 1 puff 0     furosemide (LASIX) 20 MG tablet Take 1 tablet (20 mg total) by mouth 2 (two) times daily 60 tablet 1     gabapentin (NEURONTIN) 800 MG tablet Take 800 mg by mouth 3 (three) times daily       guaiFENesin (MUCINEX) 600 MG 12 hr tablet Take 2 tablets (1,200 mg total) by mouth 2 (two) times daily 14 tablet 0     indomethacin (INDOCIN) 50 MG capsule Take 1 capsule (50 mg total) by mouth 3 (three) times daily with meals 30 capsule 0     naloxone (NARCAN) 4 MG/0.1ML nasal spray 1 spray intranasally. If pt does not respond or relapses into respiratory depression call 911. Give additional doses every 2-3 min. 2 each 0     oxybutynin (DITROPAN) 5 MG tablet Take 1 tablet (5 mg total) by mouth 3 (three) times daily (Patient taking differently: Take 10 mg by mouth 3 (three) times daily   ) 90 tablet 1     oxyCODONE-acetaminophen (PERCOCET) 10-325 MG per tablet Take 1 tablet by mouth every 4 (four) hours as needed for Pain 15 tablet 0     risperiDONE (RISPERDAL) 3 MG tablet Take 3 mg by mouth 2 (two) times daily       sertraline (ZOLOFT) 100 MG tablet Take 100 mg by mouth 2 (two) times daily          [DISCONTINUED] lactobacillus/streptococcus (RISAQUAD) Cap Take 1 capsule by mouth daily 30 capsule 0     [DISCONTINUED] ondansetron (ZOFRAN-ODT) 4 MG disintegrating tablet Take 1 tablet (4 mg total) by mouth every 6 (six) hours as needed for Nausea 8 tablet 0        Code Status:      full code    Review of Systems:        General ROS: Low-grade fever   ENT ROS:  No sore throat no nasal discharge   Endocrine ROS:   fatigue   Respiratory ROS:  +shortness of breadth wheezing cough chest congestion    Cardiovascular ROS:  No chest pain or palpitation   Gastrointestinal ROS:  No nausea vomiting diarrhea.  No melanotic  stool   Genito-Urinary ROS:  No burning in the urine or hematuria   Musculoskeletal ROS:  No musculoskeletal deformities   Neurological ROS:  No stroke seizure disorder   Dermatological ROS:  No skin rash      Physical Exam:     Vitals:    10/08/20 1306   BP: 114/71   Pulse: 85   Resp: 16   Temp: 99 F (37.2 C)   SpO2: 94%         Intake/Output Summary (Last 24 hours) at 10/08/2020 1455  Last data filed at 10/08/2020 0800  Gross per 24 hour   Intake 660 ml   Output 500 ml   Net 160 ml           General appearance -mild to moderate respiratory distress  Mental status - Alert and oriented x3  Eyes -  PERRLA  Nose - no nasal discharge  Mouth - mucous membrane is moist.  No evidence of sore throat  Neck - no JVD lymphadenopathy or thyromegaly  Chest -scattered wheezing all over the lung field  Heart - S1-S2 RRR no S3-S4 no murmur  Abdomen - soft nontender bowel sounds are normal with no hepatosplenomegaly  Neurological - no motor or sensory deficit  Extremities - no edema clubbing or cyanosis  Skin - no skin rash  Capillary refill time less than 3 seconds  Labs:       CBC w/Diff CMP   Recent Labs   Lab 10/08/20  0621 10/07/20  0000   WBC 18.94* 12.10*   Hgb 11.7 12.1   Hematocrit 36.9 37.3   Platelets 237 250   MCV 91.8 88.8   Neutrophils  --  64.1       PT/INR         Recent Labs   Lab 10/08/20  0621 10/07/20  0000   Sodium 138 135*   Potassium 4.9 3.8   Chloride 108 104   CO2 23 21*   BUN 14.0 15.0   Creatinine 0.7 0.7   Glucose 121* 115*   Calcium 10.0 9.7   Protein, Total  --  5.6*   Albumin  --  3.0*   AST (SGOT)  --  11   ALT  --  9   Alkaline Phosphatase  --  94   Bilirubin, Total  --  0.1*      Glucose POCT   Recent Labs   Lab 10/08/20  0621 10/07/20  0000   Glucose 121* 115*        Recent Labs   Lab 10/08/20  0621 10/07/20  2155 10/07/20  1739   Troponin I <0.01 <0.01 <0.01             ABGs:    ABG CollectionSite   Date Value Ref Range Status   01/02/2019 Left Radl  Final     Allen's Test   Date Value Ref  Range Status   01/02/2019 Yes  Final     pH, Arterial   Date Value Ref Range Status   01/02/2019 7.312 (L) 7.350 - 7.450 Final     pCO2, Arterial   Date Value Ref Range Status   01/02/2019 34.6 (L) 35.0 - 45.0 mmHg Final     pO2, Arterial   Date Value Ref Range Status   01/02/2019 85.5 80.0 - 90.0 mmHg Final     HCO3, Arterial   Date Value Ref Range Status   01/02/2019 17.0 (L) 23.0 - 29.0 mEq/L Final     Base Excess, Arterial   Date Value Ref Range Status   01/02/2019 -7.9 (L) -2.0 - 2.0 mEq/L Final     O2 Sat, Arterial   Date Value Ref Range Status   01/02/2019 96.4 95.0 - 100.0 % Final       Urinalysis        Invalid input(s): LEUKOCYTESUR      Rads:     Radiology Results (24 Hour)     ** No results found for the last 24 hours. **      .  Quillian Quince MD  10/08/2020  2:55 PM

## 2020-10-08 NOTE — Consults (Signed)
[facility] CONSULTATION REPORT    Date Time: 10/08/20 10:42 AM  Patient Name: [name]  Requesting Physician: Morton Amy, MD  Physical exam and history done with Covid precautions.    Assessment:   Atypical chest pain  COPD  Morbid obesity  Chronic cough with no Covid vaccination currently Covid rule out      Plan:   I would address her pulmonary issues currently.  Her chest pain is atypical and she has a palpable component to it.  Most likely musculoskeletal.  She can get a outpatient nuclear stress test on discharge        Signed by: Lauree Chandler, MD, MD  OFFICE [phone]  MD LINE [phone]      Reason for Consultation:   Pleuritic chest pain      History:   [name] is a 49 y.o. female who presents to the hospital on 10/06/2020 with COPD cigarette smoker, and no relavent cardiac history {D.  Patient describes a midsternal chest discomfort which occurs with direct pressure applied.  She stated she started having dyspnea on exertion thought was due to her COPD and this is been unchanged.  Occasionally uses oxygen at home.  Her troponins have been negative and her ECG is not showing any significant changes here.    Past Medical History:     Past Medical History:   Diagnosis Date    Anxiety     Chronic obstructive pulmonary disease     Panic attacks        Past Surgical History:     Past Surgical History:   Procedure Laterality Date    APPENDECTOMY      ARTHROSCOPIC KNEE, ACL RECONSTRUCTION      R. knee     CHOLECYSTECTOMY         Family History:     Family History   Problem Relation Age of Onset    Heart attack Father     Diabetes Father     Asthma Sister     Diabetes Sister     Cancer Maternal Grandmother     Cancer Paternal Grandmother     Heart disease Paternal Grandmother     Diabetes Mother     Cancer Maternal Grandfather     Cancer Paternal Grandfather        Social History:     Social History     Socioeconomic History    Marital status: Single      Spouse name: Not on file    Number of children: Not on file    Years of education: Not on file    Highest education level: Not on file   Occupational History    Not on file   Tobacco Use    Smoking status: Current Every Day Smoker     Packs/day: 0.25     Years: 25.00     Pack years: 6.25     Types: Cigarettes     Last attempt to quit: 12/29/2018     Years since quitting: 1.7    Smokeless tobacco: Never Used   Vaping Use    Vaping Use: Never used   Substance and Sexual Activity    Alcohol use: Yes     Comment: special occasion    Drug use: Yes     Types: Marijuana    Sexual activity: Yes     Partners: Male     Birth control/protection: None     Comment: rarely  Other Topics Concern    Not on file   Social History Narrative    Not on file     Social Determinants of Health     Financial Resource Strain:     Difficulty of Paying Living Expenses: Not on file   Food Insecurity:     Worried About [name] in the Last Year: Not on file    Ran Out of Food in the Last Year: Not on file   Transportation Needs:     Lack of Transportation (Medical): Not on file    Lack of Transportation (Non-Medical): Not on file   Physical Activity:     Days of Exercise per Week: Not on file    Minutes of Exercise per Session: Not on file   Stress:     Feeling of Stress : Not on file   Social Connections:     Frequency of Communication with Friends and Family: Not on file    Frequency of Social Gatherings with Friends and Family: Not on file    Attends Religious Services: Not on file    Active Member of Clubs or Organizations: Not on file    Attends Archivist Meetings: Not on file    Marital Status: Not on file   Intimate Partner Violence:     Fear of Current or Ex-Partner: Not on file    Emotionally Abused: Not on file    Physically Abused: Not on file    Sexually Abused: Not on file   Housing Stability:     Unable to Pay for Housing in the Last Year: Not on file    Number of [place] in the Last Year: Not on file    Unstable Housing in the Last Year: Not on file       Allergies:     Allergies   Allergen Reactions    Contrast [Iodinated Diagnostic Agents]        Medications:     Medications Prior to Admission   Medication Sig    albuterol (PROVENTIL HFA;VENTOLIN HFA) 108 (90 Base) MCG/ACT inhaler Inhale 2 puffs into the lungs every 4 (four) hours as needed for Wheezing    albuterol-ipratropium (DUO-NEB) 2.5-0.5(3) mg/3 mL nebulizer Take 3 mLs by nebulization every 6 (six) hours as needed (for SOB or Wheezing)    ALPRAZolam (XANAX) 0.5 MG tablet Take 0.5 mg by mouth 2 (two) times daily as needed       atomoxetine (STRATTERA) 10 MG capsule Take 80 mg by mouth daily       benzonatate (TESSALON) 200 MG capsule Take 1 capsule (200 mg total) by mouth 3 (three) times daily as needed for Cough    cyclobenzaprine (FLEXERIL) 10 MG tablet Take 1 tablet (10 mg total) by mouth 3 (three) times daily as needed for Muscle spasms    famotidine (PEPCID) 20 MG tablet Take 1 tablet (20 mg total) by mouth every 12 (twelve) hours    fluticasone-salmeterol (ADVAIR DISKUS) 500-50 MCG/DOSE Aerosol Powder, Breath Activtivatede Inhale 1 puff into the lungs 2 (two) times daily    furosemide (LASIX) 20 MG tablet Take 1 tablet (20 mg total) by mouth 2 (two) times daily    gabapentin (NEURONTIN) 800 MG tablet Take 800 mg by mouth 3 (three) times daily    guaiFENesin (MUCINEX) 600 MG 12 hr tablet Take 2 tablets (1,200 mg total) by mouth 2 (two) times daily    indomethacin (INDOCIN) 50 MG capsule Take 1  capsule (50 mg total) by mouth 3 (three) times daily with meals    naloxone (NARCAN) 4 MG/0.1ML nasal spray 1 spray intranasally. If pt does not respond or relapses into respiratory depression call 911. Give additional doses every 2-3 min.    oxybutynin (DITROPAN) 5 MG tablet Take 1 tablet (5 mg total) by mouth 3 (three) times daily (Patient taking differently: Take 10 mg by mouth 3 (three) times daily   )     oxyCODONE-acetaminophen (PERCOCET) 10-325 MG per tablet Take 1 tablet by mouth every 4 (four) hours as needed for Pain    risperiDONE (RISPERDAL) 3 MG tablet Take 3 mg by mouth 2 (two) times daily    sertraline (ZOLOFT) 100 MG tablet Take 100 mg by mouth 2 (two) times daily       [DISCONTINUED] lactobacillus/streptococcus (RISAQUAD) Cap Take 1 capsule by mouth daily    [DISCONTINUED] ondansetron (ZOFRAN-ODT) 4 MG disintegrating tablet Take 1 tablet (4 mg total) by mouth every 6 (six) hours as needed for Nausea                  _0 @       Review of Systems:   Comprehensive review of systems including constitutional, eyes, ears, nose, mouth, throat, cardiovascular, GI, GU, musculoskeletal, integumentary, respiratory, neurologic, psychiatric, and endocrine is negative other than what is mentioned already in the history of present illness    Physical Exam:     Vitals:    10/08/20 0813   BP: 115/73   Pulse: 74   Resp: 16   Temp: 98.6 F (37 C)   SpO2: 95%     Temp (24hrs), Avg:98.3 F (36.8 C), Min:98.1 F (36.7 C), Max:98.6 F (37 C)      Intake and Output Summary (Last 24 hours) at Date Time    Intake/Output Summary (Last 24 hours) at 10/08/2020 1042  Last data filed at 10/08/2020 0800  Gross per 24 hour   Intake 660 ml   Output 500 ml   Net 160 ml   Limited exam done in order to reduce Covid exposure  GENERAL: Patient is in no acute distress       CARDIAC: Normal chest expansion CHEST: Normal effort with occasional cough  ABDOMEN: No visible masses   EXTREMITIES: No visible edema.  NEUROLOGIC: Alert and oriented to time, place and person, normal mood and affect       Labs Reviewed:   Recent CMP   Recent Labs     10/08/20  0621   Glucose 121*   BUN 14.0   Creatinine 0.7   Sodium 138   CO2 23   Calcium 10.0     Recent CARDIAC ENZYMES No results for input(s): TROPONIN, ISTATTROPONI, CK in the last 24 hours.    Invalid input(s): TROPONINT, CKMB[24  Recent TSH Invalid input(s): TSH[24  Recent CBC WITH DIFF    Recent Labs     10/08/20  0621   RBC 4.02   Hgb 11.7   Hematocrit 36.9   MCV 91.8   MCHC 31.7   RDW 14   MPV 11.2   Platelets 237     Recent LFT No results for input(s): AST, ALT, ALP, ALB, GLOB in the last 24 hours.    Invalid input(s): BILIT, BILID, BILII, TP, AG[24  Recent LIPID PANEL No results for input(s): CHOL, TRIG, HDL in the last 24 hours.    Invalid input(s): LDLC, VLDLC, LRAT[24  Recent ABG No results for input(s): TEMP, FIO2,  RATE, MODE, ETCO2, PEEP in the last 24 hours.    Invalid input(s): APH, APCO2, APO2, AHCO3, ATCO2, ABE, AOSAT, ABGS, ALLEN, STATS, O2DEL, O2FLO, PRESS, VNTMN, PRSUP, TIVOL[24        Rads:   Radiological Procedure reviewed.      chest X-ray  ECG-sinus rhythm no acute changes    This note was generated by the Epic EMR system/ Dragon speech recognition and may contain inherent errors or omissions not intended by the user. Grammatical errors, random word insertions, deletions and pronoun errors  are occasional consequences of this technology due to software limitations. Not all errors are caught or corrected. If there are questions or concerns about the content of this note or information contained within the body of this dictation they should be addressed directly with the author for clarification.

## 2020-10-08 NOTE — Respiratory Progress Note (Signed)
Respiratory Therapy                              Patient Re-Assessment Note/Protocol Order Changes    Patient has been assessed and re-evaluated for the follow therapies:       Respiratory Orders   (From admission, onward)             Start     Ordered    10/07/20 2100  CPAP continuous-not intubated  QHS (RT)      Comments: Per home setting   Question Answer Comment   PEEP 10    FLOW Start at: (Liters per minute) 1        10/07/20 1747    10/07/20 2000  Resp Re-Assess Adult (RT Use Only)  2 times daily (RT)       10/07/20 1808    10/07/20 2000  Isolation Patient (RT Use only)  Every 6 hours scheduled (RT)       10/07/20 1836    10/07/20 1400  Nebulizer treatment intermittent  Every 6 hours while awake (RT)      Comments:   All Adult patients ordered for Respiratory Therapy, i.e., inhaled meds, secretion clearance/lung expansion or Oxygen greater than 5 liters/min will be evaluated by a Respiratory Therapist and assessed per Respiratory Therapy Patient Driven Protocol.  Initial assessment and changes made per protocol can be found in the progress note section of the patient chart.    10/07/20 1342    10/07/20 0900  MDI/DPI  Every morning (RT)      Comments:   All Adult patients ordered for Respiratory Therapy, i.e., inhaled meds, secretion clearance/lung expansion or Oxygen greater than 5 liters/min will be evaluated by a Respiratory Therapist and assessed per Respiratory Therapy Patient Driven Protocol.  Initial assessment and changes made per protocol can be found in the progress note section of the patient chart.    10/07/20 0555              IP Meds - Nasal and Inhaled (From admission, onward)            Start     Stop Status Route Frequency Ordered    10/07/20 1415  albuterol-ipratropium (DUO-NEB) 2.5-0.5(3) mg/3 mL nebulizer 3 mL         -- Dispensed NEBULIZATION RT - Every 6 hours while awake 10/07/20 1342    10/07/20 1330  albuterol-ipratropium  (DUO-NEB) 2.5-0.5(3) mg/3 mL nebulizer 3 mL         -- Verified NEBULIZATION RT - Every 6 hours as needed 10/07/20 1330    10/07/20 0900  fluticasone furoate-vilanterol (BREO ELLIPTA) 200-25 MCG/INH 1 puff         -- Dispensed IN RT - Every morning 10/07/20 0548               Current Criteria For Therapy  Secretion Clearance: None indicated  Lung Expansion: None indicated  Medications: Hx of COPD, Asthma, Bronchitis or other documented RAD    Expected Outcomes          Meds: Decreased bronchospasms    Outcomes Met        Meds: No       Reassessment Recommendations  Recommendations: Continue current treatment plan    Patient's orders have been modified as follows per RT Patient Driven Protocol.    CONTINUE CURRENT THERAPY     If any questions, please contact the Respiratory Therapist  assigned to this patient    Thank you

## 2020-10-08 NOTE — Progress Notes (Signed)
Patient is alert and oriented x4. Patient denies chest pain and shortness of breath.  Patient is ambulatory to the bathroom. Patient's safety was maintained during the shift. Patient continues  on tele. Patient vital signs and lab work were monitored. Doctor responded and explained plan of care with patient at bed side. Patient was educated on all her medications and precautions to be taken. No distress noted during the shift.

## 2020-10-08 NOTE — UM Notes (Signed)
10/08/2020    MEDICAID HMO/[employer] MEDICAID    10/07/20 1832  Admit to Inpatient  Once                Consults      Signed   Date of Service:  10/08/2020 10:42 AM   Creation Time:  10/08/2020 10:42 AM              Signed        Expand All Collapse All          _0 Hide copied text    _1 Hover for details    [place] [facility] CONSULTATION REPORT    Date Time: 10/08/20 10:42 AM  Patient Name: [name]  Requesting Physician: Morton Amy, MD  Physical exam and history done with Covid precautions.    Assessment:   Atypical chest pain  COPD  Morbid obesity  Chronic cough with no Covid vaccination currently Covid rule out      Plan:   I would address her pulmonary issues currently.  Her chest pain is atypical and she has a palpable component to it.  Most likely musculoskeletal.  She can get a outpatient nuclear stress test on discharge                History:   [name] is a 49 y.o. female who presents to the hospital on 10/06/2020 with COPD cigarette smoker, and no relavent cardiac history {D.  Patient describes a midsternal chest discomfort which occurs with direct pressure applied.  She stated she started having dyspnea on exertion thought was due to her COPD and this is been unchanged.  Occasionally uses oxygen at home.  Her troponins have been negative and her ECG is not showing any significant changes here.  O2 sat 88% , Glucose 121, WBC 18.94, BNP 170, Hemolosys index 20, 95% O2 sat on 2L O2    Report    Scheduled    Medication Ordered Dose/Rate, Route, Frequency Last Action   albuterol-ipratropium (DUO-NEB) 2.5-0.5(3) mg/3 mL nebulizer 3 mL 3 mL, NEBULIZATION, Q6H WA Given, 3 mL at 11/19 0845   atomoxetine (STRATTERA) capsule 80 mg 80 mg, PO, Daily Ordered   azithromycin (ZITHROMAX) 500 mg in sodium chloride 0.9 % 250 mL IVPB 500 mg, IV, Q24H New Bag, 500 mg at 11/18 2130   cefTRIAXone (ROCEPHIN) 1 g in sodium chloride 0.9 % 100 mL IVPB mini-bag plus 1 g, IV, Q24H New  Bag, 1 g at 11/19 0630   fluticasone furoate-vilanterol (BREO ELLIPTA) 200-25 MCG/INH 1 puff 1 puff, IN, QAM Given, 1 puff at 11/19 0906   furosemide (LASIX) injection 40 mg 40 mg, IV, Daily Given, 40 mg at 11/19 0846   gabapentin (NEURONTIN) capsule 800 mg 800 mg, PO, Q8H Given, 800 mg at 11/19 0630   guaiFENesin (MUCINEX) 12 hr tablet 1,200 mg 1,200 mg, PO, BID Given, 1,200 mg at 11/19 0847   heparin (porcine) injection 5,000 Units 5,000 Units, SC, Q8H [facility] Given, 5,000 Units at 11/19 0630   methylPREDNISolone sodium succinate (Solu-MEDROL) injection 40 mg 40 mg, IV, Q8H Given, 40 mg at 11/19 0846   nicotine (NICODERM CQ) 14 MG/24HR patch 1 patch 1 patch, TD, Daily Patch Applied, 1 patch at 11/19 0847   oxybutynin (DITROPAN) tablet 10 mg 10 mg, PO, TID Given, 10 mg at 11/19 0630   risperiDONE (RisperDAL) tablet 3 mg 3 mg, PO, BID Given, 3 mg at 11/19 0847   sertraline (ZOLOFT) tablet 100  mg 100 mg, PO, BID Given, 100 mg at 11/19 0847     PRN    Medication Ordered Dose/Rate, Route, Frequency Last Action   acetaminophen (TYLENOL) suppository 650 mg (Or Linked Group #1) 650 mg, RE, Q6H PRN Ordered   acetaminophen (TYLENOL) tablet 650 mg (Or Linked Group #1) 650 mg, PO, Q6H PRN Ordered   acetaminophen (TYLENOL) tablet 650 mg 650 mg, PO, Once PRN Ordered   albuterol-ipratropium (DUO-NEB) 2.5-0.5(3) mg/3 mL nebulizer 3 mL 3 mL, NEBULIZATION, Q6H PRN Ordered   ALPRAZolam (XANAX) tablet 0.5 mg 0.5 mg, PO, BID PRN Ordered   benzonatate (TESSALON) capsule 200 mg 200 mg, PO, TID PRN Ordered   calcium carbonate (TUMS) chewable tablet 500 mg 500 mg, PO, 4X Daily PRN Ordered   cyclobenzaprine (FLEXERIL) tablet 10 mg 10 mg, PO, TID PRN Ordered   dextrose (GLUCOSE) 40 % oral gel 15 g of glucose (And Linked Group #2) 15 g of glucose, PO, PRN Ordered   dextrose 50 % bolus 12.5 g (And Linked Group #2) 12.5 g, IV, PRN Ordered   glucagon (rDNA) (GLUCAGEN) injection 1 mg (And Linked Group #2) 1 mg, IM, PRN Ordered   melatonin tablet 3  mg 3 mg, PO, QHS PRN Ordered   naloxone (NARCAN) injection 0.2 mg 0.2 mg, IV, PRN Ordered   ondansetron (ZOFRAN) injection 4 mg (Or Linked Group #3) 4 mg, IV, Q6H PRN Ordered   ondansetron (ZOFRAN-ODT) disintegrating tablet 4 mg (Or Linked Group #3) 4 mg, PO, Q6H PRN Ordered   oxyCODONE (ROXICODONE) immediate release tablet 10 mg 10 mg, PO, Q6H PRN Given, 10 mg at 11/19 0846   oxyCODONE (ROXICODONE) immediate release tablet 5 mg 5 mg, PO, Q6H PRN
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
## 2020-10-08 NOTE — Plan of Care (Signed)
Problem: Safety  Goal: Patient will be free from injury during hospitalization  Flowsheets (Taken 10/08/2020 0500)  Patient will be free from injury during hospitalization:   Use appropriate transfer methods   Assess patient's risk for falls and implement fall prevention plan of care per policy   Provide and maintain safe environment   Ensure appropriate safety devices are available at the bedside   Include patient/ family/ care giver in decisions related to safety   Hourly rounding     Problem: Pain  Goal: Pain at adequate level as identified by patient  Flowsheets (Taken 10/08/2020 0500)  Pain at adequate level as identified by patient:   Identify patient comfort function goal   Assess for risk of opioid induced respiratory depression, including snoring/sleep apnea. Alert healthcare team of risk factors identified.   Assess pain on admission, during daily assessment and/or before any "as needed" intervention(s)   Reassess pain within 30-60 minutes of any procedure/intervention, per Pain Assessment, Intervention, Reassessment (AIR) Cycle   Evaluate if patient comfort function goal is met     Problem: Psychosocial and Spiritual Needs  Goal: Demonstrates ability to cope with hospitalization/illness  Flowsheets (Taken 10/08/2020 0500)  Demonstrates ability to cope with hospitalizations/illness:   Provide quiet environment   Assist patient to identify own strengths and abilities   Encourage patient to set small goals for self   Encourage verbalization of feelings/concerns/expectations   Encourage participation in diversional activity   Reinforce positive adaptation of new coping behaviors     Patient AO X 4, VSS. Complained of pain medicated as scheduled.  Pt on RA, no SOB, denies chest pain, no fever, nausea or vomiting.  Medicated as scheduled.  Tele monitoring in place, noted with NSR on the monitor. as ordered.   Fall bundle in place, encouraged patient to call for assistance as needed.   Purposeful rounding  done.

## 2020-10-09 LAB — CBC AND DIFFERENTIAL
Absolute NRBC: 0 10*3/uL (ref 0.00–0.00)
Basophils Absolute Automated: 0.05 10*3/uL (ref 0.00–0.08)
Basophils Automated: 0.3 %
Eosinophils Absolute Automated: 0 10*3/uL (ref 0.00–0.44)
Eosinophils Automated: 0 %
Hematocrit: 40 % (ref 34.7–43.7)
Hgb: 12.5 g/dL (ref 11.4–14.8)
Immature Granulocytes Absolute: 0.23 10*3/uL — ABNORMAL HIGH (ref 0.00–0.07)
Immature Granulocytes: 1.5 %
Lymphocytes Absolute Automated: 1.74 10*3/uL (ref 0.42–3.22)
Lymphocytes Automated: 11.4 %
MCH: 28.8 pg (ref 25.1–33.5)
MCHC: 31.3 g/dL — ABNORMAL LOW (ref 31.5–35.8)
MCV: 92.2 fL (ref 78.0–96.0)
MPV: 10.8 fL (ref 8.9–12.5)
Monocytes Absolute Automated: 0.46 10*3/uL (ref 0.21–0.85)
Monocytes: 3 %
Neutrophils Absolute: 12.76 10*3/uL — ABNORMAL HIGH (ref 1.10–6.33)
Neutrophils: 83.8 %
Nucleated RBC: 0 /100 WBC (ref 0.0–0.0)
Platelets: 278 10*3/uL (ref 142–346)
RBC: 4.34 10*6/uL (ref 3.90–5.10)
RDW: 14 % (ref 11–15)
WBC: 15.24 10*3/uL — ABNORMAL HIGH (ref 3.10–9.50)

## 2020-10-09 LAB — COMPREHENSIVE METABOLIC PANEL
ALT: 11 U/L (ref 0–55)
AST (SGOT): 9 U/L (ref 5–34)
Albumin/Globulin Ratio: 1.1 (ref 0.9–2.2)
Albumin: 3.2 g/dL — ABNORMAL LOW (ref 3.5–5.0)
Alkaline Phosphatase: 86 U/L (ref 37–106)
Anion Gap: 8 (ref 5.0–15.0)
BUN: 16 mg/dL (ref 7.0–19.0)
Bilirubin, Total: 0.1 mg/dL — ABNORMAL LOW (ref 0.2–1.2)
CO2: 28 mEq/L (ref 22–29)
Calcium: 10.6 mg/dL — ABNORMAL HIGH (ref 8.5–10.5)
Chloride: 103 mEq/L (ref 100–111)
Creatinine: 0.8 mg/dL (ref 0.6–1.0)
Globulin: 3 g/dL (ref 2.0–3.6)
Glucose: 195 mg/dL — ABNORMAL HIGH (ref 70–100)
Potassium: 4.8 mEq/L (ref 3.5–5.1)
Protein, Total: 6.2 g/dL (ref 6.0–8.3)
Sodium: 139 mEq/L (ref 136–145)

## 2020-10-09 LAB — ECHOCARDIOGRAM ADULT COMPLETE W CLR/ DOPP WAVEFORM
Ao Root Diameter (2D): 3.644
IVS Diastolic Thickness (2D): 1.052
LA Dimension (2D): 3.668
LVID diastole (2D): 4.766
LVID systole (2D): 3.654
Mitral Valve Findings: NORMAL
Pulmonary Valve Findings: NORMAL
Tricuspid Valve Findings: NORMAL

## 2020-10-09 LAB — GFR: EGFR: >60

## 2020-10-09 LAB — MAGNESIUM: Magnesium: 1.9 mg/dL (ref 1.6–2.6)

## 2020-10-09 LAB — PHOSPHORUS: Phosphorus: 2.5 mg/dL (ref 2.3–4.7)

## 2020-10-09 LAB — TROPONIN I: Troponin I: <0.01 ng/mL (ref 0.00–0.05)

## 2020-10-09 LAB — HEMOLYSIS INDEX: Hemolysis Index: 5 (ref 0–18)

## 2020-10-09 MED ORDER — DOXYCYCLINE HYCLATE 100 MG PO TABS
100.0000 mg | ORAL_TABLET | Freq: Two times a day (BID) | ORAL | 0 refills | Status: AC
Start: 2020-10-09 — End: 2020-10-16

## 2020-10-09 MED ORDER — OXYCODONE-ACETAMINOPHEN 10-325 MG PO TABS
1.0000 | ORAL_TABLET | ORAL | 0 refills | Status: DC | PRN
Start: 2020-10-09 — End: 2020-10-10

## 2020-10-09 MED ORDER — NALOXONE HCL 4 MG/0.1ML NA LIQD
NASAL | 0 refills | Status: AC
Start: 2020-10-09 — End: ?

## 2020-10-09 MED ORDER — GUAIFENESIN ER 600 MG PO TB12
1200.0000 mg | ORAL_TABLET | Freq: Two times a day (BID) | ORAL | 0 refills | Status: AC
Start: 2020-10-09 — End: 2020-10-14

## 2020-10-09 MED ORDER — ALBUTEROL-IPRATROPIUM 2.5-0.5 (3) MG/3ML IN SOLN
3.0000 mL | Freq: Four times a day (QID) | RESPIRATORY_TRACT | 0 refills | Status: AC | PRN
Start: 2020-10-09 — End: 2020-11-08

## 2020-10-09 MED ORDER — NICOTINE 14 MG/24HR TD PT24
1.0000 | MEDICATED_PATCH | Freq: Every day | TRANSDERMAL | 0 refills | Status: AC
Start: 2020-10-10 — End: 2020-11-09

## 2020-10-09 MED ORDER — BENZONATATE 200 MG PO CAPS
200.0000 mg | ORAL_CAPSULE | Freq: Three times a day (TID) | ORAL | 0 refills | Status: AC | PRN
Start: 2020-10-09 — End: ?

## 2020-10-09 MED ORDER — ALBUTEROL-IPRATROPIUM 2.5-0.5 (3) MG/3ML IN SOLN
3.0000 mL | Freq: Four times a day (QID) | RESPIRATORY_TRACT | 0 refills | Status: DC | PRN
Start: 2020-10-09 — End: 2020-10-09

## 2020-10-09 MED ORDER — PREDNISONE 20 MG PO TABS
40.0000 mg | ORAL_TABLET | Freq: Every day | ORAL | 0 refills | Status: AC
Start: 2020-10-09 — End: 2020-10-14

## 2020-10-09 NOTE — Progress Notes (Signed)
RN printed patient AVS and initiated discharge teaching with pt as ordered but she is crying now. She is saying that her CPAP is distryoed, her oxygen holes is distroyed, does not have the distilled water for it and she will need [place] to arranged for somebody to take her upstairs, plus a ride home. MD and charge nurse notified.

## 2020-10-09 NOTE — Respiratory Progress Note (Cosign Needed)
Respiratory Therapy                              Patient Re-Assessment Note/Protocol Order Changes    Patient has been assessed and re-evaluated for the follow therapies:       Respiratory Orders   (From admission, onward)             Start     Ordered    10/07/20 2100  CPAP continuous-not intubated  QHS (RT)      Comments: Per home setting   Question Answer Comment   PEEP 10    FLOW Start at: (Liters per minute) 1        10/07/20 1747    10/07/20 2000  Resp Re-Assess Adult (RT Use Only)  2 times daily (RT)       10/07/20 1808    10/07/20 1400  Nebulizer treatment intermittent  Every 6 hours while awake (RT)      Comments:   All Adult patients ordered for Respiratory Therapy, i.e., inhaled meds, secretion clearance/lung expansion or Oxygen greater than 5 liters/min will be evaluated by a Respiratory Therapist and assessed per Respiratory Therapy Patient Driven Protocol.  Initial assessment and changes made per protocol can be found in the progress note section of the patient chart.    10/07/20 1342    10/07/20 0900  MDI/DPI  Every morning (RT)      Comments:   All Adult patients ordered for Respiratory Therapy, i.e., inhaled meds, secretion clearance/lung expansion or Oxygen greater than 5 liters/min will be evaluated by a Respiratory Therapist and assessed per Respiratory Therapy Patient Driven Protocol.  Initial assessment and changes made per protocol can be found in the progress note section of the patient chart.    10/07/20 0555              IP Meds - Nasal and Inhaled (From admission, onward)            Start     Stop Status Route Frequency Ordered    10/07/20 1415  albuterol-ipratropium (DUO-NEB) 2.5-0.5(3) mg/3 mL nebulizer 3 mL         -- Dispensed NEBULIZATION RT - Every 6 hours while awake 10/07/20 1342    10/07/20 1330  albuterol-ipratropium (DUO-NEB) 2.5-0.5(3) mg/3 mL nebulizer 3 mL         -- Dispensed NEBULIZATION RT - Every 6 hours as needed  10/07/20 1330    10/07/20 0900  fluticasone furoate-vilanterol (BREO ELLIPTA) 200-25 MCG/INH 1 puff         -- Dispensed IN RT - Every morning 10/07/20 0548               Current Criteria For Therapy  Secretion Clearance: None indicated  Lung Expansion: None indicated  Medications: Hx of COPD, Asthma, Bronchitis or other documented RAD    Expected Outcomes          Meds: Decreased bronchospasms    Outcomes Met  Secretion: Yes  Lung Expansion: No  Meds: No       Reassessment Recommendations  Recommendations: Continue current treatment plan    Patient's orders have been modified as follows per RT Patient Driven Protocol.    Based on my assessment patient still needs current tx.    If any questions, please contact the Respiratory Therapist assigned to this patient    Thank you

## 2020-10-09 NOTE — Progress Notes (Signed)
Unable to get blood specimen for lab work. Patient is hard stick. RN called IV team nurse. IV team nurse was not able to get the specimen. Charge nurse and MD notified.
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
## 2020-10-09 NOTE — Plan of Care (Signed)
Patient AO X 4 VSS.  Complained of pain medicated as scheduled. Pt on 2L oxygen sating 94%. No fever, nausea or vomiting noted.  Able to ambulate to the bathroom.  Tele monitoring in place, noted with NSR on the monitor. Unable to draw AM labs. pt is a hard stick.   Fall bundle in place, encouraged patient to call for assistance as needed.   Purposeful rounding done.    Problem: Safety  Goal: Patient will be free from injury during hospitalization  Flowsheets (Taken 10/09/2020 0004)  Patient will be free from injury during hospitalization:   Assess patient's risk for falls and implement fall prevention plan of care per policy   Provide and maintain safe environment   Use appropriate transfer methods   Ensure appropriate safety devices are available at the bedside   Include patient/ family/ care giver in decisions related to safety   Hourly rounding     Problem: Pain  Goal: Pain at adequate level as identified by patient  Flowsheets (Taken 10/09/2020 0003)  Pain at adequate level as identified by patient:   Identify patient comfort function goal   Assess for risk of opioid induced respiratory depression, including snoring/sleep apnea. Alert healthcare team of risk factors identified.   Assess pain on admission, during daily assessment and/or before any "as needed" intervention(s)   Reassess pain within 30-60 minutes of any procedure/intervention, per Pain Assessment, Intervention, Reassessment (AIR) Cycle   Evaluate patient's satisfaction with pain management progress   Evaluate if patient comfort function goal is met     Problem: Psychosocial and Spiritual Needs  Goal: Demonstrates ability to cope with hospitalization/illness  Flowsheets (Taken 10/09/2020 0004)  Demonstrates ability to cope with hospitalizations/illness:   Encourage verbalization of feelings/concerns/expectations   Provide quiet environment   Encourage patient to set small goals for self   Include patient/ patient care companion in  decisions

## 2020-10-09 NOTE — PT Eval Note (Signed)
[facility]  Physical Therapy Evaluation and Treatment    Patient: ROONEY GLADWIN  MRN#: [id number]  Unit: [address]  Bed: A2328/A2328.01    Time of Evaluation and Treatment:  Time Calculation  PT Received On: 10/09/20  Start Time: 1402  Stop Time: 1441  Time Calculation (min): 39 min    Evaluation Time: 15 minutes  Treatment Time: 24 minutes    Chart Review and Collaboration with Care Team: 5 minutes, not included in above time    PT Visit Number: 1    Consult received for [name] for PT Evaluation and Treatment.  Patients medical condition is appropriate for Physical therapy intervention at this time.    ___________________________________________________    POST ACUTE CARE THERAPY RECOMMENDATIONS:   Discharge Recommendation: Home with supervision,Home with home health PT      DME Recommended for Discharge:  (bariatric 4 wheeled RW)      ACUTE CARE THERAPY RECOMMENDATIONS:  Pt would benefit from Physical Therapy to address deficits and increase functional independence.     Is an Occupational Therapy Evaluation Indicated at this time? No, an acute care OT evaluation is not required at this time.      (Therapy recommendations are subject to change with patient status.  Please refer to the most recent PT/OT note for up-to-date recommendations.)  ___________________________________________________      Activity Orders:  PT eval and treat    Precautions and Contraindications:  Precautions  Weight Bearing Status: no restrictions  Other Precautions: falls    Personal Protective Equipment (PPE)  gloves, procedure gown and eye shield/covering    Medical Diagnosis:  Acute bronchospasm due to viral infection [J98.01, B34.9]  Chronic obstructive pulmonary disease, unspecified COPD type [J44.9]    History of Present Illness:  KATHALEEN DUDZIAK is a 49 y.o. female admitted on 10/06/2020 with productive cough with yellow sputum, occasional wheezing, and sternal chest pain with coughing.  DX: PNA    Patient Active Problem List   Diagnosis    Multifocal pneumonia    COPD with acute exacerbation    CAD (coronary artery disease)    OSA (obstructive sleep apnea)    Anxiety    Overactive bladder    PNA (pneumonia)    Chest tightness    Acute bronchospasm due to viral infection       Past Medical/Surgical History:  Past Medical History:   Diagnosis Date    Anxiety     Chronic obstructive pulmonary disease     Panic attacks      Past Surgical History:   Procedure Laterality Date    APPENDECTOMY      ARTHROSCOPIC KNEE, ACL RECONSTRUCTION      R. knee     CHOLECYSTECTOMY         X-Rays/Tests/Labs:  Lab Results   Component Value Date/Time    HGB 11.7 10/08/2020 06:21 AM    HCT 36.9 10/08/2020 06:21 AM    K 4.9 10/08/2020 06:21 AM    NA 138 10/08/2020 06:21 AM    INR 0.9 01/31/2019 10:42 PM    TROPI <0.01 10/08/2020 06:21 AM    TROPI <0.01 10/07/2020 09:55 PM    TROPI <0.01 10/07/2020 05:39 PM    TROPI <0.01 10/07/2020 02:47 PM       All imaging reviewed, please see chart for details.    Social History:  Prior Level of Function  Prior level of function: Independent with ADLs,Ambulates with assistive device  Assistive  Device: Four wheel walker  Baseline Activity Level: Household ambulation (limited)  Driving: does not drive (friend provides transportation)  Cooking: light meal prep (MIL and fiancee cook)  Employment: Disabled  DME Currently at Home: Home O2,Walker, [place] Arrangements: Spouse/significant other,Family members  Type of Home: [place]: One level,Stairs to enter with rails (add number in comment) (3rd floor apt)  Bathroom Shower/Tub: Walk-in shower  DME Currently at Home: Home O2,Walker, [place] - Notes / Comments: has assist as needed in home      Subjective:  Patient is agreeable to participation in the therapy session. Nursing clears patient for therapy.  Patient Goal: get  better before going home  Pain Assessment  Pain Assessment: Numeric Scale (0-10)  Pain Score: 6-moderate pain  Pain Location: Back;Knee  Pain Orientation:  (low back, B knees)  Pain Frequency: Continuous  Pain Intervention(s): Rest      Objective:  Observation of Patient/Vital Signs:  SpO2 sat 91-93% post activity    Inspection/Posture: min BLE edema    Cognitive Status and Neuro Exam:  Cognition/Neuro Status  Arousal/Alertness: Appropriate responses to stimuli  Attention Span: Appears intact  Orientation Level: Oriented X4  Memory: Appears intact  Following Commands: Follows all commands and directions without difficulty  Safety Awareness: independent  Insights: Fully aware of deficits  Behavior: calm;cooperative;attentive    Musculoskeletal Examination  Gross ROM  Neck/Trunk ROM: within functional limits  Right Upper Extremity ROM: within functional limits  Left Upper Extremity ROM: within functional limits  Right Lower Extremity ROM: within functional limits (BLE limited by habitus)  Left Lower Extremity ROM: within functional limits  Gross Strength  Neck/Trunk Strength: WFL  Right Upper Extremity Strength: within functional limits  Left Upper Extremity Strength: within functional limits  Right Lower Extremity Strength: 3/5  Left Lower Extremity Strength: 3/5       Functional Mobility:  Functional Mobility  Rolling: Modified Independent  Supine to Sit: Modified Independent  Scooting to EOB: Independent  Sit to Supine: Modified Independent  Sit to Stand: Nurse, learning disability  Stand to Sit: Contact Guard Assist  Transfers  Bed to Chair: Nurse, learning disability  Chair to Bed: Youth worker Used for Functional Transfer: other (comment) (rollator)  Locomotion  Ambulation: Nurse, learning disability (20'x4 with rollator)  Pattern: decreased step length     Balance  Balance  Balance: needs focused assessment  Sitting - Dynamic: Good  Standing - Dynamic: Good (with AD)    Participation and Activity  Tolerance  Participation and Endurance  Participation Effort: good  Endurance: Tolerates 10 - 20 min exercise with multiple rests  [facility] Dyspnea Scale: 2+ Dyspnea    Educated the patient to role of physical therapy, plan of care, goals of therapy and HEP, safety with mobility and ADLs; patient verbalized understanding.    Patient left in bed with alarm and all other medical equipment in place and call bell and all personal items/needs within reach.  RN notified of session outcome.        Assessment:  NAZIRAH TRI is a 49 y.o. female admitted 10/06/2020.  PT Assessment  Assessment: Decreased LE strength;Decreased endurance/activity tolerance;Gait impairment  Prognosis: Good;With continued PT status post acute discharge  Progress: Improving as expected     Pt reports improved breathing, however note SOB upon minimal activity and requires rests to recover. Pt completed functional  mobility without assist, using RW during ambulation. Pt will benefit from continued PT upon d/c home for conditioning.    Treatment:  - pt educated/instructed on use of IS, pt demo understanding and proper technique  - pt educated/discussed bariatric DME needs  Including shower chair, rollator  - pt amb 20' x4 with RW and CGA, noted slowed cadence and required rest in between trials  - pt instucted on seated therex for conditioning and LE elevation for edema ocontrol        Plan:  Treatment/Interventions: Exercise,Gait training,Stair training,Endurance training  PT Frequency: 1-2x/wk  Risks/Benefits/POC Discussed with Pt/Family: With patient    PMP Activity: Step 7 - Walks out of Room  Distance Walked (ft) (Step 6,7): 20 Feet      Goals:  Goals  Goal Formulation: With patient  Time for Goal Acheivement: By time of discharge  Goals: [place] goal  Pt Will Ambulate: 51-100 feet,with four wheel walker,with stand by assist,to maximize functional mobility and independence  Pt Will Go Up / Down Stairs: 3-5 stairs,with contact guard  assist,to maximize functional mobility and independence  Pt Will Perform Home Exer Program: independent,to maximize functional mobility and independence    Jonna Munro, PT  PRN   10/09/2020  2:58 PM

## 2020-10-09 NOTE — Progress Notes (Signed)
PULMONARY PROGRESS NOTE     Date Time: 10/09/20 5:30 AM  Patient Name: [facility] PROBLEM LIST  Patient Active Problem List    Diagnosis Date Noted    Acute bronchospasm due to viral infection 10/07/2020    Chest tightness 11/25/2019    PNA (pneumonia) 02/01/2019    Multifocal pneumonia 01/11/2019    COPD with acute exacerbation 01/11/2019    CAD (coronary artery disease) 01/11/2019    OSA (obstructive sleep apnea) 01/11/2019    Anxiety 01/11/2019    Overactive bladder 01/11/2019       Subjective:   Notes and data reviewed.  In view of Covid 19 rule out, and preservation of PPE, patient was not seen in person, data reviewed and impression and suggestions below.    Physical Exam:     Vitals:    10/09/20 0437   BP: 105/67   Pulse: 87   Resp: 18   Temp: 97.5 F (36.4 C)   SpO2: 90%     Temp (24hrs), Avg:98.2 F (36.8 C), Min:97.5 F (36.4 C), Max:99 F (37.2 C)        Intake/Output Summary (Last 24 hours) at 10/09/2020 0530  Last data filed at 10/09/2020 1698  Gross per 24 hour   Intake 1230 ml   Output 0 ml   Net 1230 ml     Physical exam reflects the last recorded by pulmonary consultant on 10/08/2020    General appearance -mild to moderate respiratory distress  Mental status - Alert and oriented x3  Eyes -  PERRLA  Nose - no nasal discharge  Mouth - mucous membrane is moist.  No evidence of sore throat  Neck - no JVD lymphadenopathy or thyromegaly  Chest -scattered wheezing all over the lung field  Heart - S1-S2 RRR no S3-S4 no murmur  Abdomen - soft nontender bowel sounds are normal with no hepatosplenomegaly  Neurological - no motor or sensory deficit  Extremities - no edema clubbing or cyanosis  Skin - no skin rash      Medications:     Current Facility-Administered Medications   Medication Dose Route Frequency Provider Last Rate Last Admin    acetaminophen (TYLENOL) tablet 650 mg  650 mg Oral Q6H PRN Emeline Darling A, PA        Or    acetaminophen (TYLENOL) suppository 650 mg   650 mg Rectal Q6H PRN Emeline Darling A, PA        acetaminophen (TYLENOL) tablet 650 mg  650 mg Oral Once PRN Arvilla Meres, MD        albuterol-ipratropium (DUO-NEB) 2.5-0.5(3) mg/3 mL nebulizer 3 mL  3 mL Nebulization Q6H PRN Morton Amy, MD   3 mL at 10/09/20 0350    albuterol-ipratropium (DUO-NEB) 2.5-0.5(3) mg/3 mL nebulizer 3 mL  3 mL Nebulization Q6H WA Morton Amy, MD   3 mL at 10/08/20 2352    ALPRAZolam (XANAX) tablet 0.5 mg  0.5 mg Oral BID PRN Emeline Darling A, PA   0.5 mg at 10/08/20 2226    atomoxetine (STRATTERA) capsule 80 mg  80 mg Oral Daily Folkner, Kathryn A, PA        azithromycin (ZITHROMAX) 500 mg in sodium chloride 0.9 % 250 mL IVPB  500 mg Intravenous Q24H Morton Amy, MD 250 mL/hr at 10/08/20 2223 500 mg at 10/08/20 2223    benzonatate (TESSALON) capsule 200 mg  200 mg Oral TID PRN Eveline Keto, PA  calcium carbonate (TUMS) chewable tablet 500 mg  500 mg Oral 4X Daily PRN Emeline Darling A, PA        cefTRIAXone (ROCEPHIN) 1 g in sodium chloride 0.9 % 100 mL IVPB mini-bag plus  1 g Intravenous Q24H Emeline Darling A, PA 200 mL/hr at 10/08/20 0630 1 g at 10/08/20 0630    cyclobenzaprine (FLEXERIL) tablet 10 mg  10 mg Oral TID PRN Emeline Darling A, PA        dextrose (GLUCOSE) 40 % oral gel 15 g of glucose  15 g of glucose Oral PRN Emeline Darling A, PA        And    dextrose 50 % bolus 12.5 g  12.5 g Intravenous PRN Emeline Darling A, PA        And    glucagon (rDNA) (GLUCAGEN) injection 1 mg  1 mg Intramuscular PRN Folkner, Kathryn A, PA        fluticasone furoate-vilanterol (BREO ELLIPTA) 200-25 MCG/INH 1 puff  1 puff Inhalation QAM Folkner, Kathryn A, PA   1 puff at 10/08/20 0906    furosemide (LASIX) injection 40 mg  40 mg Intravenous Daily Morton Amy, MD   40 mg at 10/08/20 0846    gabapentin (NEURONTIN) capsule 800 mg  800 mg Oral Q8H Folkner, Kathryn A, PA   800 mg at 10/08/20 2226    guaiFENesin (MUCINEX) 12 hr tablet  1,200 mg  1,200 mg Oral BID Eveline Keto, PA   1,200 mg at 10/08/20 1700    heparin (porcine) injection 5,000 Units  5,000 Units Subcutaneous Q8H [place] Folkner, Curt Bears A, PA   5,000 Units at 10/08/20 2226    melatonin tablet 3 mg  3 mg Oral QHS PRN Emeline Darling A, PA        methylPREDNISolone sodium succinate (Solu-MEDROL) injection 40 mg  40 mg Intravenous Q8H Folkner, Kathryn A, PA   40 mg at 10/09/20 0048    naloxone (NARCAN) injection 0.2 mg  0.2 mg Intravenous PRN Emeline Darling A, PA        nicotine (NICODERM CQ) 14 MG/24HR patch 1 patch  1 patch Transdermal Daily Morton Amy, MD   1 patch at 10/08/20 0847    ondansetron (ZOFRAN-ODT) disintegrating tablet 4 mg  4 mg Oral Q6H PRN Emeline Darling A, PA        Or    ondansetron (ZOFRAN) injection 4 mg  4 mg Intravenous Q6H PRN Emeline Darling A, PA        oxybutynin (DITROPAN) tablet 10 mg  10 mg Oral TID Emeline Darling A, PA   10 mg at 10/08/20 2226    oxyCODONE (ROXICODONE) immediate release tablet 10 mg  10 mg Oral Q6H PRN Emeline Darling A, PA   10 mg at 10/08/20 2336    oxyCODONE (ROXICODONE) immediate release tablet 5 mg  5 mg Oral Q6H PRN Emeline Darling A, PA   5 mg at 10/08/20 1412    risperiDONE (RisperDAL) tablet 3 mg  3 mg Oral BID Emeline Darling A, PA   3 mg at 10/08/20 1700    sertraline (ZOLOFT) tablet 100 mg  100 mg Oral BID Emeline Darling A, PA   100 mg at 10/08/20 1700         Labs:     Recent Labs   Lab 10/08/20  0621 10/07/20  0000   Glucose 121* 115*   BUN 14.0 15.0   Creatinine 0.7 0.7   Calcium 10.0 9.7  Sodium 138 135*   Potassium 4.9 3.8   Chloride 108 104   CO2 23 21*   Albumin  --  3.0*   AST (SGOT)  --  11   ALT  --  9   Bilirubin, Total  --  0.1*   Alkaline Phosphatase  --  94     Recent Labs   Lab 10/08/20  0621 10/07/20  0000   WBC 18.94* 12.10*   Hgb 11.7 12.1   Hematocrit 36.9 37.3   MCV 91.8 88.8   MCH 29.1 28.8   MCHC 31.7 32.4   Platelets 237 250         No results for input(s): PTT, PT,  INR in the last 72 hours.      ABGs:    ABG CollectionSite   Date Value Ref Range Status   10/08/2020 not provided  Final     Allen's Test   Date Value Ref Range Status   10/08/2020 not provided  Final     pH, Arterial   Date Value Ref Range Status   10/08/2020 7.350 7.350 - 7.450 Final     pCO2, Arterial   Date Value Ref Range Status   10/08/2020 46.0 (H) 35.0 - 45.0 mmHg Final     pO2, Arterial   Date Value Ref Range Status   10/08/2020 66.1 (L) 80.0 - 90.0 mmHg Final     HCO3, Arterial   Date Value Ref Range Status   10/08/2020 24.8 23.0 - 29.0 mEq/L Final     Base Excess, Arterial   Date Value Ref Range Status   10/08/2020 -0.6 -2.0 - 2.0 mEq/L Final     O2 Sat, Arterial   Date Value Ref Range Status   10/08/2020 93.6 (L) 95.0 - 100.0 % Final         Rads:     Radiology Results (24 Hour)     ** No results found for the last 24 hours. **            Assessment/Plan:   My impression is:     PULMONARY  Reported history of COPD  Bilateral groundglass attenuation consistent with pneumonitis/congestive heart failure  Reported history of sleep apnea  Rule out Covid 19-negative test on admission  Others per note of 10/08/2020      OTHER  Leukocytosis  Elevated BNP  Per primary care and other consultants       My suggestions:    PULMONARY  Maintain supplemental oxygen to keep saturation over 92%  Antibiotics per infectious disease  Steroid taper  Maintain bronchodilator/inhaled steroid  Smoking cessation  Outpatient pulmonary follow-up      OTHER  Per primary care and other consultants  Follow-up with cardiology as noted in consultation note    No other active suggestions  For any acute questions or concerns through the weekend, please contact the critical care physician on call.    This note was dictated using voice recognition speech to text software and may contain inadvertent recognition errors.      Signed by: Vassie Moment, MD

## 2020-10-09 NOTE — Plan of Care (Signed)
Problem: Safety  Goal: Patient will be free from injury during hospitalization  Outcome: Progressing  Flowsheets (Taken 10/09/2020 0959)  Patient will be free from injury during hospitalization:   Assess patient's risk for falls and implement fall prevention plan of care per policy   Provide and maintain safe environment   Include patient/ family/ care giver in decisions related to safety   Ensure appropriate safety devices are available at the bedside   [place] rounding   Assess for patients risk for elopement and implement Elopement Risk Plan per policy   Use appropriate transfer methods  Goal: Patient will be free from infection during hospitalization  Outcome: Progressing  Flowsheets (Taken 10/09/2020 0959)  Free from Infection during hospitalization:   Assess and monitor for signs and symptoms of infection   Monitor lab/diagnostic results   Monitor all insertion sites (i.e. indwelling lines, tubes, urinary catheters, and drains)   Encourage patient and family to use good hand hygiene technique     Problem: Pain  Goal: Pain at adequate level as identified by patient  Outcome: Progressing  Flowsheets (Taken 10/09/2020 0959)  Pain at adequate level as identified by patient:   Identify patient comfort function goal   Assess pain on admission, during daily assessment and/or before any "as needed" intervention(s)   Reassess pain within 30-60 minutes of any procedure/intervention, per Pain Assessment, Intervention, Reassessment (AIR) Cycle   Evaluate if patient comfort function goal is met   Evaluate patient's satisfaction with pain management progress   Consult/collaborate with Physical Therapy, Occupational Therapy, and/or Speech Therapy   Offer non-pharmacological pain management interventions   Include patient/patient care companion in decisions related to pain management as needed     Problem: Discharge Barriers  Goal: Patient will be discharged home or other facility with appropriate resources  Outcome:  Progressing  [place] (Taken 10/09/2020 0959)  Discharge to home or other facility with appropriate resources:   Provide appropriate patient education   Provide information on available health resources   Initiate discharge planning     Problem: Psychosocial and Spiritual Needs  Goal: Demonstrates ability to cope with hospitalization/illness  Outcome: Progressing  Flowsheets (Taken 10/09/2020 0959)  Demonstrates ability to cope with hospitalizations/illness:   Encourage verbalization of feelings/concerns/expectations   Provide quiet environment   Reinforce positive adaptation of new coping behaviors   Include patient/ patient care companion in decisions     Problem: Moderate/High Fall Risk Score >5  Goal: Patient will remain free of falls  Outcome: Progressing  Flowsheets (Taken 10/09/2020 0959)  High (Greater than 13): MOD-Include family in multidisciplinary POC discussions

## 2020-10-09 NOTE — Discharge Instr - Activity (Signed)
As tolerated

## 2020-10-09 NOTE — Discharge Instr - Diet (Signed)
Regular diet

## 2020-10-09 NOTE — Plan of Care (Signed)
Patient Aox4, VSS, on 2L o2, +SOB, deneis CP, taking all her meds, OOB with standby assist, voiding. Will continue with the plan of care.  Problem: Safety  Goal: Patient will be free from injury during hospitalization  Outcome: Progressing  Flowsheets (Taken 10/09/2020 0959 by Barton Dubois, RN)  Patient will be free from injury during hospitalization:   Assess patient's risk for falls and implement fall prevention plan of care per policy   Provide and maintain safe environment   Include patient/ family/ care giver in decisions related to safety   Ensure appropriate safety devices are available at the bedside   [place] rounding   Assess for patients risk for elopement and implement [place] per policy   Use appropriate transfer methods     Problem: Pain  Goal: Pain at adequate level as identified by patient  Outcome: Progressing  Flowsheets (Taken 10/09/2020 0959 by Barton Dubois, RN)  Pain at adequate level as identified by patient:   Identify patient comfort function goal   Assess pain on admission, during daily assessment and/or before any "as needed" intervention(s)   Reassess pain within 30-60 minutes of any procedure/intervention, per Pain Assessment, Intervention, Reassessment (AIR) Cycle   Evaluate if patient comfort function goal is met   Evaluate patient's satisfaction with pain management progress   Consult/collaborate with Physical Therapy, Occupational Therapy, and/or Speech Therapy   Offer non-pharmacological pain management interventions   Include patient/patient care companion in decisions related to pain management as needed     Problem: Inadequate Gas Exchange  Goal: Adequate oxygenation and improved ventilation  Outcome: Progressing  Flowsheets (Taken 10/09/2020 2307)  Adequate oxygenation and improved ventilation:   Assess lung sounds   Monitor SpO2 and treat as needed   Teach/reinforce use of incentive spirometer 10 times per hour while awake, cough and deep breath as needed    Monitor and treat ETCO2   Consult/collaborate with Respiratory Therapy   Provide mechanical and oxygen support to facilitate gas exchange   Plan activities to conserve energy: plan rest periods   Position for maximum ventilatory efficiency   Increase activity as tolerated/progressive mobility
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
## 2020-10-10 LAB — CBC
Absolute NRBC: 0 10*3/uL (ref 0.00–0.00)
Hematocrit: 38.3 % (ref 34.7–43.7)
Hgb: 12 g/dL (ref 11.4–14.8)
MCH: 28.8 pg (ref 25.1–33.5)
MCHC: 31.3 g/dL — ABNORMAL LOW (ref 31.5–35.8)
MCV: 91.8 fL (ref 78.0–96.0)
MPV: 10.5 fL (ref 8.9–12.5)
Nucleated RBC: 0 /100 WBC (ref 0.0–0.0)
Platelets: 292 10*3/uL (ref 142–346)
RBC: 4.17 10*6/uL (ref 3.90–5.10)
RDW: 14 % (ref 11–15)
WBC: 15.08 10*3/uL — ABNORMAL HIGH (ref 3.10–9.50)

## 2020-10-10 LAB — BASIC METABOLIC PANEL
Anion Gap: 6 (ref 5.0–15.0)
BUN: 17 mg/dL (ref 7.0–19.0)
CO2: 27 mEq/L (ref 22–29)
Calcium: 10.2 mg/dL (ref 8.5–10.5)
Chloride: 103 mEq/L (ref 100–111)
Creatinine: 0.7 mg/dL (ref 0.6–1.0)
Glucose: 139 mg/dL — ABNORMAL HIGH (ref 70–100)
Potassium: 5.2 mEq/L — ABNORMAL HIGH (ref 3.5–5.1)
Sodium: 136 mEq/L (ref 136–145)

## 2020-10-10 LAB — TROPONIN I
Troponin I: <0.01 ng/mL (ref 0.00–0.05)
Troponin I: <0.01 ng/mL (ref 0.00–0.05)

## 2020-10-10 LAB — GFR: EGFR: >60

## 2020-10-10 LAB — HEMOLYSIS INDEX
Hemolysis Index: 17 (ref 0–18)
Hemolysis Index: 20 — ABNORMAL HIGH (ref 0–18)

## 2020-10-10 LAB — POTASSIUM: Potassium: 4.1 mEq/L (ref 3.5–5.1)

## 2020-10-10 MED ORDER — OXYCODONE-ACETAMINOPHEN 10-325 MG PO TABS
1.0000 | ORAL_TABLET | ORAL | 0 refills | Status: DC | PRN
Start: 2020-10-10 — End: 2021-03-31

## 2020-10-10 NOTE — UM Notes (Addendum)
CONCURRENT REVIEW October 09, 2020      PATIENT NAME: [name], [name]  DOB: [birth year]    History of present illness: Pt is a 49 y.o. female arrived at [facility] on 10/06/2020 at 2305.    BP 127/71    Pulse 84    Temp 98.3 F (36.8 C) (Oral)    Resp 18    Ht 1.727 m (_0 )    Wt 141.7 kg (312 lb 5.9 oz)    SpO2 92%    BMI 47.50 kg/m     Temp:  [97.3 F (36.3 C)-98.4 F (36.9 C)]   Heart Rate:  [74-93]   Resp Rate:  [16-20]   BP: (105-129)/(67-79)   SpO2:  [90 %-94 %]   Weight:  [141.7 kg (312 lb 5.9 oz)]     Last recorded pain score:  Pain Scale Used: Numeric Scale (0-10)  Pain Score: 5-moderate pain     O2 NC 2 Lpm    Abnormal Labs:   Lab Results last 48 Hours     Procedure Component Value Units Date/Time    Troponin I [[id number]] Collected: 10/09/20 1618    Specimen: Blood Updated: 10/09/20 1710     Troponin I <0.01 ng/mL     Comprehensive metabolic panel [[id number]]  (Abnormal) Collected: 10/09/20 1618    Specimen: Blood Updated: 10/09/20 1706     Glucose 195 mg/dL      Calcium 10.6 mg/dL      Albumin 3.2 g/dL      Bilirubin, Total 0.1 mg/dL     CBC and differential [[id number]]  (Abnormal) Collected: 10/09/20 1618    Specimen: Blood Updated: 10/09/20 1649     WBC 15.24 x10 3/uL      MCHC 31.3 g/dL      Neutrophils Absolute 12.76 x10 3/uL      Immature Granulocytes Absolute 0.23 x10 3/uL     Blood gas, arterial [[id number]]  (Abnormal) Collected: 10/08/20 1854    Specimen: Blood, Arterial Updated: 10/08/20 1916     pH, Arterial 7.350     pCO2, Arterial 46.0 mmHg      pO2, Arterial 66.1 mmHg      HCO3, Arterial 24.8 mEq/L      Arterial Total CO2 50.8 mEq/L      Base Excess, Arterial -0.6 mEq/L      O2 Sat, Arterial 93.6 %      ABG CollectionSite not provided     Allen's Test not provided     Temperature 37.0     FIO2 not provided %      Status not provided     O2 Delivery not provided     O2 Flow not provided L/min      Rate not provided BPM      Mode: not provided     Pressure Control not provided     ETCO2 not  provided     Minute Ventilation not provided L/min      PEEP not provided     Pressure Support not provided     Tidal vol. not provided    B-type Natriuretic Peptide [[id number]]  (Abnormal) Collected: 10/08/20 0621    Specimen: Blood Updated: 10/08/20 0724     B-Natriuretic Peptide 170 pg/mL     CBC without differential [[id number]]  (Abnormal) Collected: 10/08/20 0621    Specimen: Blood Updated: 10/08/20 0701     WBC 18.94 x10 3/uL     Basic Metabolic Panel [[id number]]  (Abnormal) Collected: 10/08/20 9774  Specimen: Blood Updated: 10/08/20 0657     Glucose 121 mg/dL     Troponin I [[id number]] Collected: 10/08/20 1959    Specimen: Blood Updated: 10/08/20 0650     Troponin I <0.01 ng/mL     GFR [[id number]] Collected: 10/08/20 0621     Updated: 10/08/20 0646     EGFR >60.0    Hemolysis index [[id number]]  (Abnormal) Collected: 10/08/20 0621     Updated: 10/08/20 0646     Hemolysis Index 20          MD Notes:  Pulmonology 09/2020  Assessment/Plan:  My impression is:   PULMONARY  Reported history of COPD  Bilateral groundglass attenuation consistent with pneumonitis/congestive heart failure  Reported history of sleep apnea  Rule out Covid 19-negative test on admission  Others per note of 10/08/2020  OTHER  Leukocytosis  Elevated BNP  Per primary care and other consultants   My suggestions:  PULMONARY  Maintain supplemental oxygen to keep saturation over 92%  Antibiotics per infectious disease  Steroid taper  Maintain bronchodilator/inhaled steroid  Smoking cessation  Outpatient pulmonary follow-up  OTHER  Per primary care and other consultants  Follow-up with cardiology as noted in consultation note    POST ACUTE CARE THERAPY RECOMMENDATIONS:   Discharge Recommendation: Home with supervision,Home with home health PT    Medications: Scheduled Meds:  Current Facility-Administered Medications   Medication Dose Route Frequency    albuterol-ipratropium  3 mL Nebulization Q6H WA    atomoxetine  80 mg Oral Daily     azithromycin  500 mg Intravenous Q24H    cefTRIAXone  1 g Intravenous Q24H    fluticasone furoate-vilanterol  1 puff Inhalation QAM    furosemide  40 mg Intravenous Daily    gabapentin  800 mg Oral Q8H    guaiFENesin  1,200 mg Oral BID    heparin (porcine)  5,000 Units Subcutaneous Q8H [place]    methylPREDNISolone  40 mg Intravenous Q8H    nicotine  1 patch Transdermal Daily    oxybutynin  10 mg Oral TID    risperiDONE  3 mg Oral BID    sertraline  100 mg Oral BID     Continuous Infusions:  PRN Meds:.acetaminophen **OR** acetaminophen, acetaminophen, albuterol-ipratropium, ALPRAZolam, benzonatate, calcium carbonate, cyclobenzaprine, Nursing communication: Adult Hypoglycemia Treatment Algorithm **AND** dextrose **AND** dextrose **AND** glucagon (rDNA), melatonin, naloxone, ondansetron **OR** ondansetron, oxyCODONE, oxyCODONE      This clinical review is based on compiled documentation provided by the treatment team within the patient's medical record.    UTILIZATION REVIEW CONTACT : Shanon Payor, MSN, RN, ACM  Utilization Review Case Manager II / Case Management  [facility]  [address]  [place], [place]  T [id number]  Jerilynn Mages [phone]   F [phone]    Email: [email or web address]  NPI: [id number] Tax ID:  [id number]

## 2020-10-10 NOTE — Progress Notes (Signed)
CM met with pt to discuss D/C Planning.  Pt stated she has not used her CPAP machine in over 6 months, was using Oxygen PRN from [facility] and needs distilled water for her Oxygen machine.  Pt also stated she has a Chiropodist but does not have a Mask for [employer].  CM has requested RT to follow up with pt to confirm d/c needs.  CM will follow up with her Oxygen Provider and [facility].

## 2020-10-10 NOTE — Progress Notes (Signed)
Pt will d/c home with requested supports.  Pt will receive Distilled Water for her Oxygen Tank along with a Mask for her Nebulizer.  CM arrange for transport with a Taxi.  Both Bedside and Charge RN are aware of the above.  No further needs identified at this time.

## 2020-10-10 NOTE — Plan of Care (Signed)
Problem: Safety  Goal: Patient will be free from injury during hospitalization  Outcome: Progressing  Flowsheets (Taken 10/10/2020 1049)  Patient will be free from injury during hospitalization:   Assess patient's risk for falls and implement fall prevention plan of care per policy   Provide and maintain safe environment   Use appropriate transfer methods   Ensure appropriate safety devices are available at the bedside   Include patient/ family/ care giver in decisions related to safety   Hourly rounding   Assess for patients risk for elopement and implement Elopement Risk Plan per policy  Goal: Patient will be free from infection during hospitalization  Outcome: Progressing  Flowsheets (Taken 10/10/2020 1049)  Free from Infection during hospitalization:   Assess and monitor for signs and symptoms of infection   Monitor lab/diagnostic results   Monitor all insertion sites (i.e. indwelling lines, tubes, urinary catheters, and drains)   Encourage patient and family to use good hand hygiene technique     Problem: Pain  Goal: Pain at adequate level as identified by patient  Outcome: Progressing  Flowsheets (Taken 10/10/2020 1049)  Pain at adequate level as identified by patient:   Identify patient comfort function goal   Assess pain on admission, during daily assessment and/or before any "as needed" intervention(s)   Reassess pain within 30-60 minutes of any procedure/intervention, per Pain Assessment, Intervention, Reassessment (AIR) Cycle   Evaluate if patient comfort function goal is met   Offer non-pharmacological pain management interventions   Evaluate patient's satisfaction with pain management progress   Include patient/patient care companion in decisions related to pain management as needed     Problem: Psychosocial and Spiritual Needs  Goal: Demonstrates ability to cope with hospitalization/illness  Outcome: Progressing  Flowsheets (Taken 10/10/2020 1049)  Demonstrates ability to cope with  hospitalizations/illness:   Encourage verbalization of feelings/concerns/expectations   Provide quiet environment   Reinforce positive adaptation of new coping behaviors   Include patient/ patient care companion in decisions     Problem: Moderate/High Fall Risk Score >5  Goal: Patient will remain free of falls  Outcome: Progressing  Flowsheets (Taken 10/10/2020 1049)  High (Greater than 13):   HIGH-Bed alarm on at all times while patient in bed   HIGH-Apply yellow "Fall Risk" arm band   HIGH-Initiate use of floor mats as appropriate   HIGH-Visual cue at entrance to patient's room   HIGH-Consider use of low bed     Problem: Inadequate Gas Exchange  Goal: Adequate oxygenation and improved ventilation  Outcome: Progressing  Flowsheets (Taken 10/10/2020 1049)  Adequate oxygenation and improved ventilation:   Assess lung sounds   Monitor SpO2 and treat as needed   Provide mechanical and oxygen support to facilitate gas exchange   Position for maximum ventilatory efficiency   Teach/reinforce use of incentive spirometer 10 times per hour while awake, cough and deep breath as needed   Plan activities to conserve energy: plan rest periods   Increase activity as tolerated/progressive mobility

## 2020-10-10 NOTE — Progress Notes (Signed)
Patient is alert and oriented x4. Patient denies chest pain and shortness of breath managed with 2 L NC oxygen. Patient's safety was maintained during the shift. Patient's potassium was 5.2 and 4.1 when repeated. Patient vital signs and lab work were monitored and communicated to Doctor. Patient was educated on all her medications and precautions to be taken. No distress noted during the shift.

## 2020-10-10 NOTE — Respiratory Progress Note (Signed)
Respiratory Therapy                              Patient Re-Assessment Note/Protocol Order Changes    Patient has been assessed and re-evaluated for the follow therapies:       Respiratory Orders   (From admission, onward)             Start     Ordered    10/07/20 2100  CPAP continuous-not intubated  QHS (RT)      Comments: Per home setting   Question Answer Comment   PEEP 10    FLOW Start at: (Liters per minute) 1        10/07/20 1747    10/07/20 2000  Resp Re-Assess Adult (RT Use Only)  2 times daily (RT)       10/07/20 1808    10/07/20 1400  Nebulizer treatment intermittent  Every 6 hours while awake (RT)      Comments:   All Adult patients ordered for Respiratory Therapy, i.e., inhaled meds, secretion clearance/lung expansion or Oxygen greater than 5 liters/min will be evaluated by a Respiratory Therapist and assessed per Respiratory Therapy Patient Driven Protocol.  Initial assessment and changes made per protocol can be found in the progress note section of the patient chart.    10/07/20 1342    10/07/20 0900  MDI/DPI  Every morning (RT)      Comments:   All Adult patients ordered for Respiratory Therapy, i.e., inhaled meds, secretion clearance/lung expansion or Oxygen greater than 5 liters/min will be evaluated by a Respiratory Therapist and assessed per Respiratory Therapy Patient Driven Protocol.  Initial assessment and changes made per protocol can be found in the progress note section of the patient chart.    10/07/20 0555              IP Meds - Nasal and Inhaled (From admission, onward)            Start     Stop Status Route Frequency Ordered    10/07/20 1415  albuterol-ipratropium (DUO-NEB) 2.5-0.5(3) mg/3 mL nebulizer 3 mL         -- Dispensed NEBULIZATION RT - Every 6 hours while awake 10/07/20 1342    10/07/20 1330  albuterol-ipratropium (DUO-NEB) 2.5-0.5(3) mg/3 mL nebulizer 3 mL         -- Dispensed NEBULIZATION RT - Every 6 hours as needed  10/07/20 1330    10/07/20 0900  fluticasone furoate-vilanterol (BREO ELLIPTA) 200-25 MCG/INH 1 puff         -- Dispensed IN RT - Every morning 10/07/20 0548               Current Criteria For Therapy  Secretion Clearance: None indicated  Lung Expansion: None indicated  Medications: Hx of COPD, Asthma, Bronchitis or other documented RAD    Expected Outcomes          Meds: Decreased bronchospasms    Outcomes Met  Secretion: Yes  Lung Expansion: No  Meds: No       Reassessment Recommendations  Recommendations: Continue current treatment plan        If any questions, please contact the Respiratory Therapist assigned to this patient    Thank you
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
## 2020-10-11 NOTE — Progress Notes (Signed)
[facility] received an e-mail from [facility] to follow on pt CPAP. Pt discharged home yesterday. [place] spoke with CM Sharyn Lull who stated pt requested distilled water for her home Oxygen machine and a mask for Nebulizer Machine, both requests were given by RT before discharge. [facility] also called pt  # [phone] to follow up, pt did not pick up and VM is full, [facility] also called patient's mother Vaughan Basta # [phone] and left a message to call back.

## 2020-10-11 NOTE — Discharge Summary (Signed)
[facility]      Patient: [name]  Admission Date: 10/06/2020   DOB: 1971/04/27  Discharge Date: 10/10/2020   MRN: [id number]  Discharge Attending:Marthe Dant Mickle Plumb, MD     Referring Physician: Virgel Bouquet, MD  PCP: Virgel Bouquet, MD       DISCHARGE SUMMARY     Discharge Information   Admission Diagnosis:   Acute bronchospasm due to viral infection    Discharge Diagnosis:   1.  Acute on chronic hypoxemic respiratory failure  2.  COPD exacerbation with diffuse bilateral wheeze  3.  Chronic smoker, actively smoking-counseled, offered nicotine replacement.  4.  Morbid obesity with BMI of 47.5, counseled on lifestyle modifications, recommended for outpatient bariatric evaluation.  5.  Exertional dyspnea-evaluated by cardiology service, primary cause for her dyspnea deemed to be pulmonary.  6.  Overactive bladder-oxybutynin.  7.  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  OSA-noncompliant with her CPAP, have not been using it for almost a year.    Admission Condition: Guarded  Discharge Condition: Stable  Consultants:   Herscowitz, Sandy Salaam, MD, Quillian Quince, MD -Pulmonology  Lauree Chandler, MD- Cardiology  Functional Status: As tolerated  Discharged to: Home  Procedures: No invasive procedures  Surgeries: None    Imaging:     CT Chest WO Contrast    Result Date: 10/07/2020   1. Groundglass opacities within the lungs bilaterally, nonspecific but compatible with an infectious or inflammatory process.  2. Upper lobe predominant chronic interstitial changes, similar to prior. [name], MD  10/07/2020 2:27 PM    Chest AP Portable    Result Date: 10/07/2020  Mild congestive changes. Timmie Foerster MD, MD  10/07/2020 12:26 AM     Echo Results     Procedure Component Value Units Date/Time    Echocardiogram Adult Complete W Clr/ Dopp Waveform [[id number]] Collected: 10/08/20 1425     Updated: 10/09/20 1155     LVID diastole (2D) 4.766     LVID systole (2D) 8.889     IVS Diastolic  Thickness (2D) 1.694     Ao Root Diameter (2D) 3.644     LA Dimension (2D) 3.668     Visually Estimated Ejection Fraction Range 55-60     MV Regurgitation Severity no     TV Regurgitation Severity no     AV Regurgitation Severity no     PV Regurgitation Severity no     Aortic Valve Findings There isno aortic regurgitation.     Aortic Valve Findings There isno aortic stenosis.     Aortic Valve Findings The aortic valve is tricuspid and thickened.     Pulmonary Valve Findings There isno pulmonic regurgitation.     Pulmonary Valve Findings The pulmonic valve is structurally normal     Mitral Valve Findings There is no mitral regurgitation     Mitral Valve Findings The mitral valve is structurally normal.     Tricuspid Valve Findings No pulmonary hypertension withestimated right ventricular systolic pressureof 36 mmhg.     Tricuspid Valve Findings There is notricuspidregurgitation     Tricuspid Valve Findings The tricuspid valve is structurally normal.    Narrative:      Name:     [name]  Age:     49 years  DOB:     10/05/1971  Gender:     Female  MRN:     [id number]  Wt:     312 lb  BSA:     2.68 m2  Technical Quality:     Technical difficult  Exam Date/Time:     10/08/2020 2:25 PM  Exam Location:     Pt room  Exam Type:     ECHOCARDIOGRAM ADULT COMPLETE W CLR/ DOPP WAVEFORM    Staff  Sonographer:     Curly Shores, RCS  Ordering Physician:     Morton Amy ALTER    Study Info  Indications       - assess for chf  Procedure    Complete two-dimensional, color flow and spectral Doppler transthoracic  echocardiogram is performed.    Optison was administered to enhance endocardial definition.    68 in      History/Risk Factors  COPD:     Yes  Coronary Artery Disease     Yes      Summary    * Left ventricular ejection fraction is normal with an estimated ejection  fraction of 55-60%.    * Left ventricular wall thickness is mildly increased.    * Grossly normal right ventricular systolic function.    * No  pulmonary hypertension with estimated right ventricular systolic  pressure of 36 mmhg..      Findings  Left Ventricle    The left ventricle is normal in size.    Left ventricular wall thickness is mildly increased.    Left ventricular ejection fraction is normal with an estimated ejection  fraction of 55-60%.    Left ventricular segmental wall motion is normal.    Left ventricular diastolic filling parameters are indeterminate.  Right Ventricle    The right ventricular cavity size is normal in size.    Grossly normal right ventricular systolic function.      Left Atrium    The left atrium is normal in size.    Right Atrium    The right atrium is normal in size.    Atrial Septum    No evidence of interatrial shunt by color Doppler.      Aortic Valve    The aortic valve is tricuspid and thickened.    There is no aortic stenosis.    There is no aortic regurgitation.    Pulmonary Valve    The pulmonic valve is structurally normal.    There is no pulmonic regurgitation.    Mitral Valve    The mitral valve is structurally normal.    There is no mitral regurgitation.    Tricuspid Valve    The tricuspid valve is structurally normal.    There is no tricuspid regurgitation.    No pulmonary hypertension with estimated right ventricular systolic pressure  of 36 mmhg..      Pericardium / Pleural Effusion    No pericardial effusion visualized.    Inferior Vena Cava    The IVC is normal in size with > 50% respiratory variance consistent with  normal RA pressure of 3 mmHg.    Aorta    The aortic root is normal in size.    The ascending aorta is normal in size.      Measurements  2D Measurements  ----------------------------------------------------------------------  Name                                 Value        Normal  ----------------------------------------------------------------------    Parasternal 2D  ----------------------------------------------------------------------  IVS Diastolic Thickness (2D)  1.05 cm      0.60-0.90   LVID Diastole (2D)                 4.77 cm     3.76-2.83   LVIW Diastolic Thickness  (2D)                               1.19 cm     0.60-0.95   LVID Systole (2D)                  3.65 cm     2.20-3.50   LA Dimension (2D)                  3.67 cm     2.70-3.80   Ao Root Diameter (2D)              3.64 cm     2.70-3.70  Tricuspid Valve Measurements  ----------------------------------------------------------------------  Name                                 Value        Normal  ----------------------------------------------------------------------    TV Regurgitation Doppler  ----------------------------------------------------------------------  TR Peak Velocity                  2.98 m/s                 TR Peak Gradient                   36 mmHg  Aorta / Venous Measurements  ----------------------------------------------------------------------  Name                                 Value        Normal  ----------------------------------------------------------------------    IVC/SVC  ----------------------------------------------------------------------  IVC Diameter (Exp 2D)              1.87 cm        <=2.10      Report Signatures  Finalized by Phyllis Ginger  MD on 10/09/2020 11:54 AM  Promoted by Curly Shores on 10/08/2020 04:08 PM        Discharge Medications:     Medication List      START taking these medications    doxycycline 100 MG tablet  Commonly known as: VIBRA-TABS  Take 1 tablet (100 mg total) by mouth 2 (two) times daily for 7 days     nicotine 14 MG/24HR  Commonly known as: NICODERM CQ  Place 1 patch onto the skin daily     predniSONE 20 MG tablet  Commonly known as: DELTASONE  Take 2 tablets (40 mg total) by mouth daily for 5 days        CHANGE how you take these medications    oxybutynin 5 MG tablet  Commonly known as: DITROPAN  Take 1 tablet (5 mg total) by mouth 3 (three) times daily  What changed: how much to take        CONTINUE taking these medications    albuterol sulfate HFA 108  (90 Base) MCG/ACT inhaler  Commonly known as: PROVENTIL  Inhale 2 puffs into the lungs every 4 (four) hours as needed for Wheezing     albuterol-ipratropium 2.5-0.5(3) mg/3 mL nebulizer  Commonly known as:  DUO-NEB  Take 3 mLs by nebulization every 6 (six) hours as needed (for SOB or Wheezing)     ALPRAZolam 0.5 MG tablet  Commonly known as: XANAX     atomoxetine 10 MG capsule  Commonly known as: STRATTERA     benzonatate 200 MG capsule  Commonly known as: TESSALON  Take 1 capsule (200 mg total) by mouth 3 (three) times daily as needed for Cough     cyclobenzaprine 10 MG tablet  Commonly known as: FLEXERIL  Take 1 tablet (10 mg total) by mouth 3 (three) times daily as needed for Muscle spasms     famotidine 20 MG tablet  Commonly known as: PEPCID  Take 1 tablet (20 mg total) by mouth every 12 (twelve) hours     fluticasone-salmeterol 500-50 MCG/DOSE Aepb  Commonly known as: ADVAIR DISKUS  Inhale 1 puff into the lungs 2 (two) times daily     furosemide 20 MG tablet  Commonly known as: LASIX  Take 1 tablet (20 mg total) by mouth 2 (two) times daily     gabapentin 800 MG tablet  Commonly known as: NEURONTIN     guaiFENesin 600 MG 12 hr tablet  Commonly known as: MUCINEX  Take 2 tablets (1,200 mg total) by mouth 2 (two) times daily for 5 days     naloxone 4 MG/0.1ML nasal spray  Commonly known as: NARCAN  1 spray intranasally. If pt does not respond or relapses into respiratory depression call 911. Give additional doses every 2-3 min.     oxyCODONE-acetaminophen 10-325 MG per tablet  Commonly known as: PERCOCET  Take 1 tablet by mouth every 4 (four) hours as needed for Pain     RisperDAL 3 MG tablet  Generic drug: risperiDONE     Zoloft 100 MG tablet  Generic drug: sertraline        STOP taking these medications    indomethacin 50 MG capsule  Commonly known as: INDOCIN           Where to Get Your Medications      These medications were sent to [id number] [place], [place] - [address] [id number]   Phone: [phone]   albuterol-ipratropium 2.5-0.5(3) mg/3 mL nebulizer   benzonatate 200 MG capsule   doxycycline 100 MG tablet   guaiFENesin 600 MG 12 hr tablet   nicotine 14 MG/24HR   predniSONE 20 MG tablet     You can get these medications from any pharmacy    Bring a paper prescription for each of these medications   naloxone 4 MG/0.1ML nasal spray   oxyCODONE-acetaminophen 10-325 MG per tablet             Hospital Course   Presentation History - 10/07/20  DSHERREL PLOCHis a 48y.o. female with a PMHx of COPD, morbid obesity, chronic everyday smoker, and chronic back and knee pain who presented with cough x 1 week with associated sinus congestion, new productive cough with yellow sputum, occasional wheezing, and sternal chest pain with coughing. She reports having DOE at home due to her COPD which is unchanged. She has oxygen at home "if I need it" but does not check her O2 sats. In ED, rapid COVID test was negative. CXR with findings of "mild congestive changes without focal consolidation, pleural effusion, or pneumothorax". BNP 12 and trop I < 0.01. EKG in NSR. SpO2 96% on room air. Not vaccinated  against COVID-19. Admitted for suspected COPD exacerbation.     Hospital Course (3 Days):  Patient is 49 year old morbidly obese female, with chronic respiratory failure, COPD, chronic smoker and actively smoking, chronic pain from multiple DJD joints admitted with above presentation of having worsening shortness of breath.  Patient was managed for COPD exacerbation, with bronchodilators, steroids, Mucinex, antibiotics.  COVID-19 test was negative, patient has vaccinated against Covid.  Patient is still reluctant about getting Covid vaccine and she was strongly recommended given her multiple risk factors.  During this hospitalization, she was evaluated by cardiology and pulmonary services.  On discharge, home nebulizer and supplemental O2 arranged by case  management.  Patient already had home oxygen before, which also she has not been compliant with.  She was noted to have significant exertional dyspnea when evaluated by PT/OT.  Patient also had CPAP at home, which she is not compliant with and has not been using it for almost a year.  Per patient, it was taken away when her house was roach infested and she has not been given replacement since.  Home health service was arranged prior to discharge.  Diagnosis, lab findings, imaging studies and plan of care as well as recommended follow-ups were all discussed with patient, she verbalized understanding.     RECOMMENDATIONS:    - Patient was informed of abnormal and incidental imaging findings during hospitalization, and advised to review this information with their  medical provider.             Best Practices   Was the patient admitted with either a CHF Exacerbation or Pneumonia? NO     Progress Note/Physical Exam at Discharge     Subjective: Patient reported feeling well, and is ready for discharge.    Vitals:    10/10/20 1147 10/10/20 1357 10/10/20 1514 10/10/20 1602   BP: 123/75  124/70    Pulse: 88  92 90   Resp: 16  19    Temp: 99 F (37.2 C)  99 F (37.2 C)    TempSrc: Oral  Oral    SpO2: 93% 93% 93%    Weight:       Height:           General: NAD  HEENT: sclera anicteric, OP: Clear, MMM  Cardiovascular: RRR, no m/r/g  Lungs: CTAB, no w/r/r  Abdomen: soft, +BS, NT/ND, no masses, no g/r  Extremities: Warm and well perfused  Skin: no rashes or lesions noted on exposed surfaces  Neuro: Answers questions appropriately, responds to commands       Diagnostics     Labs/Studies Pending at Discharge:    Last Labs   Recent Labs   Lab 10/10/20  0324 10/09/20  1618 10/08/20  0621   WBC 15.08* 15.24* 18.94*   RBC 4.17 4.34 4.02   Hgb 12.0 12.5 11.7   Hematocrit 38.3 40.0 36.9   MCV 91.8 92.2 91.8   Platelets 292 278 237       Recent Labs   Lab 10/10/20  1016 10/10/20  0324 10/09/20  1618 10/08/20  0621 10/07/20  0000    Sodium  --  136 139 138 135*   Potassium 4.1 5.2* 4.8 4.9 3.8   Chloride  --  103 103 108 104   CO2  --  _0 21*   BUN  --  17.0 16.0 14.0 15.0   Creatinine  --  0.7 0.8 0.7 0.7   Glucose  --  139* 195*  121* 115*   Calcium  --  10.2 10.6* 10.0 9.7   Magnesium  --   --  1.9  --   --        Microbiology Results (last 15 days)     Procedure Component Value Units Date/Time    CULTURE + Lacretia Leigh [[id number]]     Order Status: Sent Specimen: Sputum, Expectorated     Legionella antigen, urine [[id number]] Collected: 10/08/20 0645    Order Status: Canceled Specimen: Urine, Clean Catch     CULTURE + Lacretia Leigh [[id number]]     Order Status: Sent Specimen: Sputum, Expectorated     COVID-19 (SARS-COV-2) ([facility] Standard test) [[id number]] Collected: 10/07/20 0923    Order Status: Completed Specimen: Nasopharyngeal Swab from Nasopharynx Updated: 10/07/20 2135     Purpose of COVID testing Diagnostic -PUI     SARS-CoV-2 Specimen Source Nasopharyngeal     SARS CoV 2 Overall Result Not Detected     Comment: Test performed using the Roche cobas SARS-CoV-2 assay.  This assay is only for use under the Food and Drug  Administration's Emergency Use Authorization. This is a  real-time RT-PCR assay for the qualitative detection of  SARS-CoV-2 (COVID-19) RNA. Viral nucleic acids may persist  in vivo, independent of viability. Detection of viral nucleic  acid does not imply the presence of infectious virus, or that  virus nucleic acid is the cause of clinical symptoms.  Test performance has not been established for immunocompromised  patients or patients without signs and symptoms of respiratory  infection. Negative results do not preclude SARS-CoV-2  infection and should not be used as the sole basis for patient  management decisions. Invalid results may be due to inhibiting  substances in the specimen and recollection should occur.    Please see Fact Sheets for patients and providers located  at:  http://olson-hall.info/         Narrative:      o Collect and clearly label specimen type:  o PREFERRED-Upper respiratory specimen: One Nasopharyngeal  Swab in Transport Media.  o Hand deliver to laboratory ASAP  Diagnostic -PUI    CULTURE + Lacretia Leigh [[id number]]     Order Status: Sent Specimen: Sputum, Expectorated     COVID-19 (SARS-COV-2) [employer] [facility] Rapid) [[id number]] Collected: 10/07/20 0020    Order Status: Completed Specimen: Nasopharyngeal Swab from Nasopharynx Updated: 10/07/20 0045     Purpose of COVID testing Screening     SARS-CoV-2 Specimen Source Nasopharyngeal     SARS CoV 2 Overall Result Negative     Comment: Test performed using the Abbott ID NOW EUA assay.  Please see Fact Sheets for patients and providers located at:  http://olson-hall.info/    This test is for the qualitative detection of SARS-CoV-2  (COVID19) nucleic acid. Viral nucleic acids may persist in vivo,  independent of viability. Detection of viral nucleic acid does  not imply the presence of infectious virus, or that virus  nucleic acid is the cause of clinical symptoms. Negative  results should be treated as presumptive and, if inconsistent  with clinical signs and symptoms or necessary for patient  management, should be tested with an alternative molecular  assay. Negative results do not preclude SARS-CoV-2 infection  and should not be used as the sole basis for patient  management decisions. Invalid results may be due to inhibiting  substances in the specimen and recollection should occur.         Narrative:      o Building control surveyor  and clearly label specimen type:  o Upper respiratory specimen: One Nasopharyngeal Dry Swab NO  Transport Media.  o Hand deliver to laboratory ASAP  Indication for testing->Extended care facility admission to  semi private room  Screening           Patient Instructions   Discharge Diet: Heart healthy  Discharge Activity: As tolerated  LABS/TESTING recommended  after discharge    Follow Up Appointment:   Follow-up Information     Virgel Bouquet, MD .    Specialty: Family Medicine  Contact information:  [address]  [place] [address]  [phone]             Herchelroath, Diego Cory, DO .    Specialty: Obstetrics and Gynecology  Contact information:  [address] [address]  [phone]             Herscowitz, Sandy Salaam, MD Follow up.    Specialty: Pulmonary Disease  Contact information:  [address]  [place] [address]  [phone]             Ileene Hutchinson, CRNA .    Specialty: Certified Registered Nurse Anesthetist  Contact information:  [address]  Anesthesiology  [place] [address]  [phone]                          Time spent examining patient, discussing with patient/family regarding hospital course, chart review, reconciling medications and discharge planning: > 35 minutes.  This patient was examined by me on 10/10/2020, the day of discharge.    Signed,  Morton Amy, MD    6:42 AM 10/11/2020

## 2020-10-11 NOTE — Progress Notes (Signed)
Patient active with [facility] [employer] for CPAP machine.  Writer contacted [employer] [phone]) to give patient a call regarding questions about pt's CPAP. [place] notified.

## 2020-10-12 MED ORDER — PERFLUTREN PROTEIN A MICROSPH IV SUSP
2.0000 mL | Freq: Once | INTRAVENOUS | Status: AC
Start: 2020-10-08 — End: 2020-10-12
  Administered 2020-10-12: 10:00:00 2 mL via INTRAVENOUS

## 2021-03-22 ENCOUNTER — Inpatient Hospital Stay
Admission: EM | Admit: 2021-03-22 | Discharge: 2021-03-31 | DRG: 140 | Disposition: A | Discharge status: Home Health | Payer: Medicaid Other | Attending: Family Medicine | Admitting: Family Medicine

## 2021-03-22 ENCOUNTER — Emergency Department: Payer: Medicaid Other

## 2021-03-22 DIAGNOSIS — J441 Chronic obstructive pulmonary disease with (acute) exacerbation: Principal | ICD-10-CM | POA: Diagnosis present

## 2021-03-22 DIAGNOSIS — Z9049 Acquired absence of other specified parts of digestive tract: Secondary | ICD-10-CM

## 2021-03-22 DIAGNOSIS — J9610 Chronic respiratory failure, unspecified whether with hypoxia or hypercapnia: Secondary | ICD-10-CM | POA: Diagnosis present

## 2021-03-22 DIAGNOSIS — I2723 Pulmonary hypertension due to lung diseases and hypoxia: Secondary | ICD-10-CM | POA: Diagnosis present

## 2021-03-22 DIAGNOSIS — Z20822 Contact with and (suspected) exposure to covid-19: Secondary | ICD-10-CM | POA: Diagnosis present

## 2021-03-22 DIAGNOSIS — J9621 Acute and chronic respiratory failure with hypoxia: Secondary | ICD-10-CM | POA: Diagnosis present

## 2021-03-22 DIAGNOSIS — Z5320 Procedure and treatment not carried out because of patient's decision for unspecified reasons: Secondary | ICD-10-CM | POA: Diagnosis not present

## 2021-03-22 DIAGNOSIS — G4733 Obstructive sleep apnea (adult) (pediatric): Secondary | ICD-10-CM | POA: Diagnosis present

## 2021-03-22 DIAGNOSIS — J9622 Acute and chronic respiratory failure with hypercapnia: Secondary | ICD-10-CM | POA: Diagnosis present

## 2021-03-22 DIAGNOSIS — G8929 Other chronic pain: Secondary | ICD-10-CM | POA: Diagnosis present

## 2021-03-22 DIAGNOSIS — F32A Depression, unspecified: Secondary | ICD-10-CM | POA: Diagnosis present

## 2021-03-22 DIAGNOSIS — R4182 Altered mental status, unspecified: Secondary | ICD-10-CM | POA: Diagnosis present

## 2021-03-22 DIAGNOSIS — F17200 Nicotine dependence, unspecified, uncomplicated: Secondary | ICD-10-CM | POA: Diagnosis present

## 2021-03-22 DIAGNOSIS — Z79891 Long term (current) use of opiate analgesic: Secondary | ICD-10-CM

## 2021-03-22 DIAGNOSIS — I272 Pulmonary hypertension, unspecified: Secondary | ICD-10-CM | POA: Diagnosis present

## 2021-03-22 DIAGNOSIS — R300 Dysuria: Secondary | ICD-10-CM | POA: Diagnosis present

## 2021-03-22 DIAGNOSIS — J8489 Other specified interstitial pulmonary diseases: Secondary | ICD-10-CM | POA: Diagnosis present

## 2021-03-22 DIAGNOSIS — I2510 Atherosclerotic heart disease of native coronary artery without angina pectoris: Secondary | ICD-10-CM | POA: Diagnosis present

## 2021-03-22 DIAGNOSIS — Z9981 Dependence on supplemental oxygen: Secondary | ICD-10-CM

## 2021-03-22 DIAGNOSIS — F17210 Nicotine dependence, cigarettes, uncomplicated: Secondary | ICD-10-CM | POA: Diagnosis present

## 2021-03-22 DIAGNOSIS — R0689 Other abnormalities of breathing: Secondary | ICD-10-CM | POA: Diagnosis present

## 2021-03-22 DIAGNOSIS — R0602 Shortness of breath: Secondary | ICD-10-CM

## 2021-03-22 DIAGNOSIS — Z6841 Body Mass Index (BMI) 40.0 and over, adult: Secondary | ICD-10-CM

## 2021-03-22 DIAGNOSIS — E875 Hyperkalemia: Secondary | ICD-10-CM | POA: Diagnosis present

## 2021-03-22 DIAGNOSIS — F419 Anxiety disorder, unspecified: Secondary | ICD-10-CM | POA: Diagnosis present

## 2021-03-22 DIAGNOSIS — M549 Dorsalgia, unspecified: Secondary | ICD-10-CM | POA: Diagnosis present

## 2021-03-22 DIAGNOSIS — Z7951 Long term (current) use of inhaled steroids: Secondary | ICD-10-CM

## 2021-03-22 DIAGNOSIS — R079 Chest pain, unspecified: Secondary | ICD-10-CM

## 2021-03-22 DIAGNOSIS — K219 Gastro-esophageal reflux disease without esophagitis: Secondary | ICD-10-CM | POA: Diagnosis present

## 2021-03-22 LAB — COMPREHENSIVE METABOLIC PANEL
ALT: 10 U/L (ref 0–55)
AST (SGOT): 13 U/L (ref 5–34)
Albumin/Globulin Ratio: 1.1 (ref 0.9–2.2)
Albumin: 2.9 g/dL — ABNORMAL LOW (ref 3.5–5.0)
Alkaline Phosphatase: 96 U/L (ref 37–117)
Anion Gap: 9 (ref 5.0–15.0)
BUN: 9 mg/dL (ref 7.0–19.0)
Bilirubin, Total: 0.5 mg/dL (ref 0.2–1.2)
CO2: 23 mEq/L (ref 22–29)
Calcium: 9.2 mg/dL (ref 8.5–10.5)
Chloride: 105 mEq/L (ref 100–111)
Creatinine: 0.6 mg/dL (ref 0.6–1.0)
Globulin: 2.6 g/dL (ref 2.0–3.6)
Glucose: 114 mg/dL — ABNORMAL HIGH (ref 70–100)
Potassium: 4.5 mEq/L (ref 3.5–5.1)
Protein, Total: 5.5 g/dL — ABNORMAL LOW (ref 6.0–8.3)
Sodium: 137 mEq/L (ref 136–145)

## 2021-03-22 LAB — GFR: EGFR: >60

## 2021-03-22 LAB — CBC AND DIFFERENTIAL
Absolute NRBC: 0 10*3/uL (ref 0.00–0.00)
Basophils Absolute Automated: 0.08 10*3/uL (ref 0.00–0.08)
Basophils Automated: 0.7 %
Eosinophils Absolute Automated: 0.7 10*3/uL — ABNORMAL HIGH (ref 0.00–0.44)
Eosinophils Automated: 6.5 %
Hematocrit: 37 % (ref 34.7–43.7)
Hgb: 12.1 g/dL (ref 11.4–14.8)
Immature Granulocytes Absolute: 0.04 10*3/uL (ref 0.00–0.07)
Immature Granulocytes: 0.4 %
Lymphocytes Absolute Automated: 2.18 10*3/uL (ref 0.42–3.22)
Lymphocytes Automated: 20.2 %
MCH: 29.1 pg (ref 25.1–33.5)
MCHC: 32.7 g/dL (ref 31.5–35.8)
MCV: 88.9 fL (ref 78.0–96.0)
MPV: 10 fL (ref 8.9–12.5)
Monocytes Absolute Automated: 0.46 10*3/uL (ref 0.21–0.85)
Monocytes: 4.3 %
Neutrophils Absolute: 7.32 10*3/uL — ABNORMAL HIGH (ref 1.10–6.33)
Neutrophils: 67.9 %
Nucleated RBC: 0 /100 WBC (ref 0.0–0.0)
Platelets: 283 10*3/uL (ref 142–346)
RBC: 4.16 10*6/uL (ref 3.90–5.10)
RDW: 14 % (ref 11–15)
WBC: 10.78 10*3/uL — ABNORMAL HIGH (ref 3.10–9.50)

## 2021-03-22 LAB — TROPONIN I: Troponin I: <0.01 ng/mL (ref 0.00–0.05)

## 2021-03-22 LAB — B-TYPE NATRIURETIC PEPTIDE: B-Natriuretic Peptide: 67 pg/mL (ref 0–100)

## 2021-03-22 LAB — COVID-19 (SARS-COV-2) & INFLUENZA  A/B, NAA (ROCHE LIAT)
Influenza A: NOT DETECTED
Influenza B: NOT DETECTED
SARS CoV 2 Overall Result: NOT DETECTED

## 2021-03-22 LAB — HCG QUANTITATIVE: hCG, Quant.: <2.4

## 2021-03-22 MED ORDER — ONDANSETRON HCL 4 MG/2ML IJ SOLN
4.0000 mg | Freq: Once | INTRAMUSCULAR | Status: AC
Start: 2021-03-22 — End: 2021-03-22
  Administered 2021-03-22: 4 mg via INTRAVENOUS
  Filled 2021-03-22: qty 2

## 2021-03-22 MED ORDER — SERTRALINE HCL 50 MG PO TABS
100.0000 mg | ORAL_TABLET | Freq: Two times a day (BID) | ORAL | Status: DC
Start: 2021-03-23 — End: 2021-03-26
  Administered 2021-03-23 – 2021-03-26 (×8): 100 mg via ORAL
  Filled 2021-03-22 (×9): qty 2

## 2021-03-22 MED ORDER — ALBUTEROL SULFATE (2.5 MG/3ML) 0.083% IN NEBU
5.0000 mg | INHALATION_SOLUTION | Freq: Once | RESPIRATORY_TRACT | Status: AC
Start: 2021-03-22 — End: 2021-03-22
  Administered 2021-03-22: 5 mg via RESPIRATORY_TRACT
  Filled 2021-03-22: qty 6

## 2021-03-22 MED ORDER — RISPERIDONE 1 MG PO TABS
3.0000 mg | ORAL_TABLET | Freq: Two times a day (BID) | ORAL | Status: DC
Start: 2021-03-23 — End: 2021-03-26
  Administered 2021-03-23 – 2021-03-26 (×8): 3 mg via ORAL
  Filled 2021-03-22 (×3): qty 3
  Filled 2021-03-22 (×2): qty 1
  Filled 2021-03-22 (×2): qty 3
  Filled 2021-03-22: qty 1
  Filled 2021-03-22 (×5): qty 3
  Filled 2021-03-22: qty 2

## 2021-03-22 MED ORDER — ATOMOXETINE HCL 10 MG PO CAPS
80.0000 mg | ORAL_CAPSULE | Freq: Every day | ORAL | Status: DC
Start: 2021-03-23 — End: 2021-03-25

## 2021-03-22 MED ORDER — MORPHINE SULFATE 4 MG/ML IJ/IV SOLN (WRAP)
4.0000 mg | Freq: Once | Status: AC
Start: 2021-03-22 — End: 2021-03-22
  Administered 2021-03-22: 4 mg via INTRAVENOUS
  Filled 2021-03-22: qty 1

## 2021-03-22 MED ORDER — CEFTRIAXONE SODIUM 1 G IJ SOLR
1.0000 g | INTRAMUSCULAR | Status: DC
Start: 2021-03-22 — End: 2021-03-23
  Administered 2021-03-22: 1 g via INTRAVENOUS
  Filled 2021-03-22: qty 1000

## 2021-03-22 MED ORDER — ALPRAZOLAM 0.5 MG PO TABS
0.5000 mg | ORAL_TABLET | Freq: Two times a day (BID) | ORAL | Status: DC | PRN
Start: 2021-03-22 — End: 2021-03-31
  Administered 2021-03-23 – 2021-03-30 (×9): 0.5 mg via ORAL
  Filled 2021-03-22 (×9): qty 1

## 2021-03-22 MED ORDER — OXYBUTYNIN CHLORIDE 5 MG PO TABS
10.0000 mg | ORAL_TABLET | Freq: Three times a day (TID) | ORAL | Status: DC
Start: 2021-03-23 — End: 2021-03-31
  Administered 2021-03-23 – 2021-03-31 (×26): 10 mg via ORAL
  Filled 2021-03-22 (×34): qty 2

## 2021-03-22 MED ORDER — GABAPENTIN 300 MG PO CAPS
300.0000 mg | ORAL_CAPSULE | Freq: Three times a day (TID) | ORAL | Status: DC | PRN
Start: 2021-03-22 — End: 2021-03-23

## 2021-03-22 MED ORDER — METHYLPREDNISOLONE SODIUM SUCC 125 MG IJ SOLR
125.0000 mg | Freq: Once | INTRAMUSCULAR | Status: AC
Start: 2021-03-22 — End: 2021-03-22
  Administered 2021-03-22: 125 mg via INTRAVENOUS
  Filled 2021-03-22: qty 2

## 2021-03-22 MED ORDER — SODIUM CHLORIDE 0.9 % IV SOLN
500.0000 mg | Freq: Once | INTRAVENOUS | Status: AC
Start: 2021-03-22 — End: 2021-03-23
  Administered 2021-03-22: 500 mg via INTRAVENOUS
  Filled 2021-03-22: qty 500

## 2021-03-22 NOTE — ED Provider Notes (Signed)
EMERGENCY DEPARTMENT HISTORY AND PHYSICAL EXAM     None        ED Course         D/w Dr. Genia Harold, [facility] inaptient            Provider Note:       R/o pna, r/o nstemi, r/o electrolyte abn, likely copd/bronchiectasis       HISTORY OF PRESENT ILLNESS   Historian:Patient  Translator Used: No    50 y.o. female h/o copd, pna, chronic back pain, current smoker her b/c of sob, cp, and back pain worsening recently.     1. Location of symptoms: chest  2. Onset of symptoms: recent   3. What was patient doing when symptoms started (Context): nothing  4. Severity: moderate  5. Timing: constant  6. Activities that worsen symptoms: exertion  7. Activities that improve symptoms: rest  8. Quality:   9. Radiation of symptoms:  10. Associated signs and Symptoms: see above  11. Are symptoms worsening? yes    MEDICAL HISTORY     Past Medical History:  Past Medical History:   Diagnosis Date   . Anxiety    . Chronic obstructive pulmonary disease    . Panic attacks        Past Surgical History:  Past Surgical History:   Procedure Laterality Date   . APPENDECTOMY (OPEN)     . ARTHROSCOPIC KNEE, ACL RECONSTRUCTION      R. knee    . CHOLECYSTECTOMY         Social History:  Social History     Socioeconomic History   . Marital status: Single   Tobacco Use   . Smoking status: Current Every Day Smoker     Packs/day: 0.25     Years: 25.00     Pack years: 6.25     Types: Cigarettes     Last attempt to quit: 12/29/2018     Years since quitting: 2.2   . Smokeless tobacco: Never Used   Vaping Use   . Vaping Use: Never used   Substance and Sexual Activity   . Alcohol use: Yes     Comment: special occasion   . Drug use: Yes     Types: Marijuana   . Sexual activity: Yes     Partners: Male     Birth control/protection: None     Comment: rarely       Family History:  Family History   Problem Relation Age of Onset   . Heart attack Father    . Diabetes Father    . Asthma Sister    . Diabetes Sister    . Cancer Maternal Grandmother    . Cancer Paternal  Grandmother    . Heart disease Paternal Grandmother    . Diabetes Mother    . Cancer Maternal Grandfather    . Cancer Paternal Grandfather        Allergies:  Allergies   Allergen Reactions   . Contrast [Iodinated Diagnostic Agents]        Outpatient Medication:  Previous Medications    ALBUTEROL (PROVENTIL HFA;VENTOLIN HFA) 108 (90 BASE) MCG/ACT INHALER    Inhale 2 puffs into the lungs every 4 (four) hours as needed for Wheezing    ALPRAZOLAM (XANAX) 0.5 MG TABLET    Take 0.5 mg by mouth 2 (two) times daily as needed       ATOMOXETINE (STRATTERA) 10 MG CAPSULE    Take 80 mg by mouth daily  BENZONATATE (TESSALON) 200 MG CAPSULE    Take 1 capsule (200 mg total) by mouth 3 (three) times daily as needed for Cough    CYCLOBENZAPRINE (FLEXERIL) 10 MG TABLET    Take 1 tablet (10 mg total) by mouth 3 (three) times daily as needed for Muscle spasms    FAMOTIDINE (PEPCID) 20 MG TABLET    Take 1 tablet (20 mg total) by mouth every 12 (twelve) hours    FLUTICASONE-SALMETEROL (ADVAIR DISKUS) 500-50 MCG/DOSE AEROSOL POWDER, BREATH ACTIVTIVATEDE    Inhale 1 puff into the lungs 2 (two) times daily    FUROSEMIDE (LASIX) 20 MG TABLET    Take 1 tablet (20 mg total) by mouth 2 (two) times daily    GABAPENTIN (NEURONTIN) 800 MG TABLET    Take 800 mg by mouth 3 (three) times daily    NALOXONE (NARCAN) 4 MG/0.1ML NASAL SPRAY    1 spray intranasally. If pt does not respond or relapses into respiratory depression call 911. Give additional doses every 2-3 min.    OXYBUTYNIN (DITROPAN) 5 MG TABLET    Take 1 tablet (5 mg total) by mouth 3 (three) times daily    OXYCODONE-ACETAMINOPHEN (PERCOCET) 10-325 MG PER TABLET    Take 1 tablet by mouth every 4 (four) hours as needed for Pain    RISPERIDONE (RISPERDAL) 3 MG TABLET    Take 3 mg by mouth 2 (two) times daily    SERTRALINE (ZOLOFT) 100 MG TABLET    Take 100 mg by mouth 2 (two) times daily            REVIEW OF SYSTEMS   ADD ROS  Sob  Back pain  cp  All other systems reviewed and are  negative.    PHYSICAL EXAM     Vitals:    03/22/21 2046   BP: 122/67   Pulse: 78   Resp: 15   Temp: 98.5 F   SpO2: 96%         Nursing note and vitals reviewed.  Constitutional:  Well developed, well nourished.   Head:  Atraumatic. Normocephalic.    Eyes:  PERRL. EOMI. Conjunctivae are not pale.  ENT:  Mucous membranes are moist and intact.   Patent airway.  Neck:  Supple. Full ROM.    Cardiovascular:  Regular rate. Regular rhythm.   Respiratory:  No evidence of respiratory distress. fine wheezing b/l  GI:  Soft and non-distended. There is no tenderness. No rebound, guarding, or rigidity.  Back:  Full ROM. Nontender.  MSK:  No edema. No cyanosis. No clubbing. Full range of motion in all extremities.  Skin:  Skin is warm and dry.  No diaphoresis. No rash.   Neurological:  Alert, awake, and appropriate. Normal speech. Motor normal.  Psychiatric:  Good eye contact. Normal interaction, affect, and behavior.          MEDICAL DECISION MAKING     Vital Signs: Reviewed the patient's vital signs.   Nursing Notes: Reviewed and utilized available nursing notes.  Medical Records Reviewed: Reviewed available past medical records.      PROCEDURES        CARDIAC STUDIES        Monitor Strip  Interpreted by ED Physician  Rate: 78  Rhythm: SR   ST Changes: none      EKG Interpretation:    19:25 NSR at 77 bpm, NAD, no LVH, qrs 94, qtc 423 (-) ST-T changes similar to ekg on 10/06/2020 as read by me.  IMAGING STUDIES      CT Chest without Contrast   Final Result         Patchy groundglass like opacities following the bronchovascular bundles   throughout the lung fields bilaterally, have increased from the prior   study. Differential diagnosis includes but is not limited to NSIP,   hypersensitivity and drug induced pneumonitis, and atypical pneumonia.      Stable mild cylindrical bronchiectasis, with parenchymal scarring and   honeycombing within the lung apices.      Enlarged main pulmonary trunk is suggestive of underlying  pulmonary   arterial hypertension.      Status post cholecystectomy.      Timmie Foerster MD, MD    03/22/2021 11:02 PM      Chest AP Portable   Final Result    Pulmonary edema.      [name], MD    03/22/2021 7:44 PM             PULSE OXIMETRY    Oxygen Saturation by Pulse Oximetry: 97%  Interventions: none  Interpretation: normal     EMERGENCY DEPT. MEDICATIONS      ED Medication Orders (From admission, onward)    Start Ordered     Status Ordering Provider    03/24/21 0600 03/23/21 0038  methylPREDNISolone sodium succinate (Solu-MEDROL) injection 40 mg  3 times daily        Route: Intravenous  Ordered Dose: 40 mg     Ordered JABBAR, LUBNA    03/23/21 2200 03/23/21 0038  montelukast (SINGULAIR) tablet 10 mg  At bedtime        Route: Oral  Ordered Dose: 10 mg     Ordered JABBAR, LUBNA    03/23/21 0900 03/22/21 2359  atomoxetine (STRATTERA) capsule 80 mg  Daily        Route: Oral  Ordered Dose: 80 mg     Ordered JABBAR, LUBNA    03/23/21 0039 03/23/21 0038  albuterol-ipratropium (DUO-NEB) 2.5-0.5(3) mg/3 mL nebulizer 3 mL  RT - Every 6 hours scheduled        Route: Nebulization  Ordered Dose: 3 mL     Ordered JABBAR, LUBNA    03/23/21 0039 03/23/21 0038  cetirizine (ZyrTEC) tablet 10 mg  Daily        Route: Oral  Ordered Dose: 10 mg     Ordered JABBAR, LUBNA    03/23/21 0039 03/23/21 0038  lidocaine (LIDODERM) 5 % 1 patch  Every 24 hours        Route: Transdermal  Ordered Dose: 1 patch     Ordered JABBAR, LUBNA    03/23/21 0039 03/23/21 0038  nicotine (NICODERM CQ) 14 MG/24HR patch 1 patch  Daily        Route: Transdermal  Ordered Dose: 1 patch     Ordered JABBAR, LUBNA    03/23/21 0037 03/23/21 0038  oxyCODONE (ROXICODONE) immediate release tablet 5 mg  Every 6 hours PRN        Route: Oral  Ordered Dose: 5 mg     Ordered JABBAR, LUBNA    03/23/21 0036 03/23/21 0035  cefTAZidime (FORTAZ) 1 g in sodium chloride 0.9 % 100 mL IVPB  Every 8 hours        Route: Intravenous  Ordered Dose: 1 g     Ordered JABBAR, LUBNA     03/23/21 0035 03/23/21 0038  guaiFENesin (ROBITUSSIN) oral solution 200 mg  Every 4 hours PRN  Route: Oral  Ordered Dose: 200 mg     Ordered JABBAR, LUBNA    03/23/21 0003 03/23/21 0002  heparin (porcine) injection 5,000 Units  Every 8 hours scheduled        Route: Subcutaneous  Ordered Dose: 5,000 Units     Ordered JABBAR, LUBNA    03/23/21 0000 03/23/21 0002  acetaminophen (TYLENOL) tablet 650 mg  Every 6 hours PRN        Route: Oral  Ordered Dose: 650 mg    "Or" Linked Group Details    Ordered JABBAR, LUBNA    03/23/21 0000 03/23/21 0002  acetaminophen (TYLENOL) suppository 650 mg  Every 6 hours PRN        Route: Rectal  Ordered Dose: 650 mg    "Or" Linked Group Details    Ordered JABBAR, LUBNA    03/23/21 0000 03/23/21 0002  ondansetron (ZOFRAN-ODT) disintegrating tablet 4 mg  Every 6 hours PRN        Route: Oral  Ordered Dose: 4 mg    "Or" Linked Group Details    Ordered JABBAR, LUBNA    03/23/21 0000 03/23/21 0002  ondansetron (ZOFRAN) injection 4 mg  Every 6 hours PRN        Route: Intravenous  Ordered Dose: 4 mg    "Or" Linked Group Details    Ordered JABBAR, LUBNA    03/23/21 0000 03/23/21 0002  melatonin tablet 3 mg  At bedtime PRN        Route: Oral  Ordered Dose: 3 mg     Ordered JABBAR, LUBNA    03/23/21 0000 03/23/21 0002  glucagon (rDNA) (GLUCAGEN) injection 1 mg  As needed        Route: Intramuscular  Ordered Dose: 1 mg    "And" Linked Group Details    Ordered JABBAR, LUBNA    03/23/21 0000 03/23/21 0002  dextrose 5 % bolus 250 mL  As needed        Route: Intravenous  Ordered Dose: 250 mL    "And" Linked Group Details    Ordered JABBAR, LUBNA    03/23/21 0000 03/23/21 0002  dextrose 50 % bolus 25 g  As needed        Route: Intravenous  Ordered Dose: 25 g    "And" Linked Group Details    Ordered JABBAR, LUBNA    03/23/21 0000 03/23/21 0002  dextrose (D10W) 10% bolus 250 mL  As needed        Route: Intravenous  Ordered Dose: 25 g    "And" Linked Group Details    Ordered JABBAR, LUBNA    03/23/21  0000 03/23/21 0002  naloxone (NARCAN) injection 0.2 mg  As needed        Route: Intravenous  Ordered Dose: 0.2 mg     Ordered JABBAR, LUBNA    03/23/21 0000 03/22/21 2359  oxybutynin (DITROPAN) tablet 10 mg  3 times daily        Route: Oral  Ordered Dose: 10 mg     Ordered JABBAR, LUBNA    03/23/21 0000 03/22/21 2359  risperiDONE (RisperDAL) tablet 3 mg  2 times daily        Route: Oral  Ordered Dose: 3 mg     Ordered JABBAR, LUBNA    03/23/21 0000 03/22/21 2359  sertraline (ZOLOFT) tablet 100 mg  2 times daily        Route: Oral  Ordered Dose: 100 mg     Ordered JABBAR, LUBNA  03/22/21 2359 03/22/21 2359  ALPRAZolam (XANAX) tablet 0.5 mg  2 times daily PRN        Route: Oral  Ordered Dose: 0.5 mg     Ordered JABBAR, LUBNA    03/22/21 2359 03/22/21 2359  gabapentin (NEURONTIN) capsule 300 mg  3 times daily PRN        Note to Pharmacy: Reducing dose   Route: Oral  Ordered Dose: 300 mg     Ordered JABBAR, LUBNA    03/22/21 2354 03/22/21 2353  morphine injection 4 mg  Once        Route: Intravenous  Ordered Dose: 4 mg     Last MAR action: Given Vanna Shavers [facility]    03/22/21 2256 03/22/21 2255    Every 24 hours        Route: Intravenous  Ordered Dose: 1 g     Discontinued Johnjoseph Rolfe [facility]    03/22/21 2256 03/22/21 2255  azithromycin (ZITHROMAX) 500 mg in sodium chloride 0.9 % 250 mL IVPB  Once        Route: Intravenous  Ordered Dose: 500 mg     Last MAR action: New Bag Tyreon Frigon [facility]    03/22/21 2133 03/22/21 2132  morphine injection 4 mg  Once        Route: Intravenous  Ordered Dose: 4 mg     Last MAR action: Given Sadia Belfiore [facility]    03/22/21 2133 03/22/21 2132  ondansetron (ZOFRAN) injection 4 mg  Once        Route: Intravenous  Ordered Dose: 4 mg     Last MAR action: Given Avian Greenawalt [facility]    03/22/21 2133 03/22/21 2132  albuterol (PROVENTIL) (2.5 MG/3ML) 0.083% nebulizer solution 5 mg  RT - Once        Route: Nebulization  Ordered Dose: 5 mg     Last MAR action: Given Kemaya Dorner [facility]    03/22/21 2133 03/22/21 2132   methylPREDNISolone sodium succinate (Solu-MEDROL) injection 125 mg  Once        Route: Intravenous  Ordered Dose: 125 mg     Last MAR action: Given Korver Graybeal [facility]          LABORATORY RESULTS    Ordered and independently interpreted AVAILABLE laboratory tests. Please see results section in chart for full details.  Results for orders placed or performed during the hospital encounter of 03/22/21   COVID-19 (SARS-CoV-2) and Influenza A/B, NAA ([facility])- Admission    Specimen: Nasopharyngeal; Culturette   Result Value Ref Range    Purpose of COVID testing Diagnostic -PUI     SARS-CoV-2 Specimen Source Nasal Swab     SARS CoV 2 Overall Result Not Detected     Influenza A Not Detected     Influenza B Not Detected    CBC and differential   Result Value Ref Range    WBC 10.78 (H) 3.10 - 9.50 x10 3/uL    Hgb 12.1 11.4 - 14.8 g/dL    Hematocrit 37.0 34.7 - 43.7 %    Platelets 283 142 - 346 x10 3/uL    RBC 4.16 3.90 - 5.10 x10 6/uL    MCV 88.9 78.0 - 96.0 fL    MCH 29.1 25.1 - 33.5 pg    MCHC 32.7 31.5 - 35.8 g/dL    RDW 14 11 - 15 %    MPV 10.0 8.9 - 12.5 fL    Neutrophils 67.9 None %    Lymphocytes  Automated 20.2 None %    Monocytes 4.3 None %    Eosinophils Automated 6.5 None %    Basophils Automated 0.7 None %    Immature Granulocytes 0.4 None %    Nucleated RBC 0.0 0.0 - 0.0 /100 WBC    Neutrophils Absolute 7.32 (H) 1.10 - 6.33 x10 3/uL    Lymphocytes Absolute Automated 2.18 0.42 - 3.22 x10 3/uL    Monocytes Absolute Automated 0.46 0.21 - 0.85 x10 3/uL    Eosinophils Absolute Automated 0.70 (H) 0.00 - 0.44 x10 3/uL    Basophils Absolute Automated 0.08 0.00 - 0.08 x10 3/uL    Immature Granulocytes Absolute 0.04 0.00 - 0.07 x10 3/uL    Absolute NRBC 0.00 0.00 - 0.00 x10 3/uL   Comprehensive metabolic panel   Result Value Ref Range    Glucose 114 (H) 70 - 100 mg/dL    BUN 9.0 7.0 - 19.0 mg/dL    Creatinine 0.6 0.6 - 1.0 mg/dL    Sodium 137 136 - 145 mEq/L    Potassium 4.5 3.5 - 5.1 mEq/L    Chloride 105 100 - 111 mEq/L     CO2 23 22 - 29 mEq/L    Calcium 9.2 8.5 - 10.5 mg/dL    Protein, Total 5.5 (L) 6.0 - 8.3 g/dL    Albumin 2.9 (L) 3.5 - 5.0 g/dL    AST (SGOT) 13 5 - 34 U/L    ALT 10 0 - 55 U/L    Alkaline Phosphatase 96 37 - 117 U/L    Bilirubin, Total 0.5 0.2 - 1.2 mg/dL    Globulin 2.6 2.0 - 3.6 g/dL    Albumin/Globulin Ratio 1.1 0.9 - 2.2    Anion Gap 9.0 5.0 - 15.0   Troponin I   Result Value Ref Range    Troponin I <0.01 0.00 - 0.05 ng/mL   Beta HCG Quantitative   Result Value Ref Range    hCG, Quant. <2.4 See Below   GFR   Result Value Ref Range    EGFR >60.0    B-type Natriuretic Peptide   Result Value Ref Range    B-Natriuretic Peptide 67 0 - 100 pg/mL       CRITICAL CARE    CRITICAL CARE: The high probability of sudden, clinically significant deterioration in the patient's condition required the highest level of my preparedness to intervene urgently.    The services I provided to this patient were to treat and/or prevent clinically significant deterioration that could result in: death, permanent disability.  Services included the following: chart data review, reviewing nursing notes and/or old charts, documentation time, consultant collaboration regarding findings and treatment options, medication orders and management, direct patient care, re-evaluations, vital sign assessments and ordering, interpreting and reviewing diagnostic studies/lab tests.    Aggregate critical care time was 35 min, which includes only time during which I was engaged in work directly related to the patient's care, as described above, whether at the bedside or elsewhere in the Emergency Department.  It did not include time spent performing other reported procedures or the services of residents, students, nurses or physician assistants.        3. Core Measures:   Please choose ONLY A, B, or C for your sepsis documentation      A. ------------Severe Sepsis/Septic Shock Exclusion----------------------------    Pt is excluded from severe sepsis or  septic shock consideration at the time of admission at 11:56 pm [enter time] because of one  or more reasons below:    All SIRS, abnl vitals, organ dysfunction are NOT due to severe sepsis or septic shock, but due to alternative cause: Other: bronchiectasis  [list cause].     The timestamp from the preceding paragraph above also applies to every infectious and/or SEP-1-related diagnosis in Clinical Impression or MDM sections of this note.           ATTESTATIONS      Physician Attestation: Di Kindle Kalani Baray, MD PhD , have been the primary provider for [name] during this Emergency Dept visit and have reviewed the chart documented for accuracy and agree with its content.       DIAGNOSIS      Diagnosis:  Final diagnoses:   SOB (shortness of breath)       Disposition:  ED Disposition     ED Disposition   Admit    Condition   --    Date/Time   Tue Mar 22, 2021 11:56 PM    Comment   Admitting Physician: Sheppard Coil [phone]   Service:: Cardiology [[id number]]   Estimated Length of Stay: > or = to 2 midnights   Tentative Discharge Plan?: Home or Self Care [1]   Does patient need telemetry?: Yes   Telemetry type (separate Telemetry order is also required):: Cardiac telemetry               Prescriptions:  Patient's Medications   New Prescriptions    No medications on file   Previous Medications    ALBUTEROL (PROVENTIL HFA;VENTOLIN HFA) 108 (90 BASE) MCG/ACT INHALER    Inhale 2 puffs into the lungs every 4 (four) hours as needed for Wheezing    ALPRAZOLAM (XANAX) 0.5 MG TABLET    Take 0.5 mg by mouth 2 (two) times daily as needed       ATOMOXETINE (STRATTERA) 10 MG CAPSULE    Take 80 mg by mouth daily       BENZONATATE (TESSALON) 200 MG CAPSULE    Take 1 capsule (200 mg total) by mouth 3 (three) times daily as needed for Cough    CYCLOBENZAPRINE (FLEXERIL) 10 MG TABLET    Take 1 tablet (10 mg total) by mouth 3 (three) times daily as needed for Muscle spasms    FAMOTIDINE (PEPCID) 20 MG TABLET    Take 1 tablet (20 mg total)  by mouth every 12 (twelve) hours    FLUTICASONE-SALMETEROL (ADVAIR DISKUS) 500-50 MCG/DOSE AEROSOL POWDER, BREATH ACTIVTIVATEDE    Inhale 1 puff into the lungs 2 (two) times daily    FUROSEMIDE (LASIX) 20 MG TABLET    Take 1 tablet (20 mg total) by mouth 2 (two) times daily    GABAPENTIN (NEURONTIN) 800 MG TABLET    Take 800 mg by mouth 3 (three) times daily    NALOXONE (NARCAN) 4 MG/0.1ML NASAL SPRAY    1 spray intranasally. If pt does not respond or relapses into respiratory depression call 911. Give additional doses every 2-3 min.    OXYBUTYNIN (DITROPAN) 5 MG TABLET    Take 1 tablet (5 mg total) by mouth 3 (three) times daily    OXYCODONE-ACETAMINOPHEN (PERCOCET) 10-325 MG PER TABLET    Take 1 tablet by mouth every 4 (four) hours as needed for Pain    RISPERIDONE (RISPERDAL) 3 MG TABLET    Take 3 mg by mouth 2 (two) times daily    SERTRALINE (ZOLOFT) 100 MG TABLET    Take 100 mg by mouth  2 (two) times daily      Modified Medications    No medications on file   Discontinued Medications    No medications on file              Lexton Hidalgo, Devin Going, MD PhD  03/23/21 [phone]

## 2021-03-22 NOTE — ED Notes (Signed)
Bed: GR12  Expected date: 03/22/21  Expected time: 6:53 PM  Means of arrival: [facility] ([facility] for [facility])  Comments:  [facility]  [phone]

## 2021-03-22 NOTE — ED Triage Notes (Signed)
[name] is a 50 y.o. female BIBA from home c/o SOB and chest tightness. Pt awoke with onset of symptoms at 5am. Pt admitted last month for pneumonia of left lung and COPD exacerbation. Pt normally on 2L O2 at home.    BP 124/78   Pulse 77   Temp 98.5 F (36.9 C) (Oral)   Resp (!) 26   Ht 5' 9" (1.753 m)   Wt 127 kg   SpO2 93%   BMI 41.35 kg/m

## 2021-03-23 ENCOUNTER — Inpatient Hospital Stay: Payer: Medicaid Other

## 2021-03-23 LAB — URINALYSIS REFLEX TO MICROSCOPIC EXAM - REFLEX TO CULTURE
Bilirubin, UA: NEGATIVE
Glucose, UA: NEGATIVE
Ketones UA: NEGATIVE
Nitrite, UA: NEGATIVE
Protein, UR: NEGATIVE
Specific Gravity UA: 1.009 (ref 1.001–1.035)
Urine pH: 7 (ref 5.0–8.0)
Urobilinogen, UA: NEGATIVE mg/dL (ref 0.2–2.0)

## 2021-03-23 LAB — BASIC METABOLIC PANEL
Anion Gap: 13 (ref 5.0–15.0)
BUN: 8 mg/dL (ref 7.0–19.0)
CO2: 20 mEq/L — ABNORMAL LOW (ref 22–29)
Calcium: 9.1 mg/dL (ref 8.5–10.5)
Chloride: 105 mEq/L (ref 100–111)
Creatinine: 0.6 mg/dL (ref 0.6–1.0)
Glucose: 161 mg/dL — ABNORMAL HIGH (ref 70–100)
Potassium: 5 mEq/L (ref 3.5–5.1)
Sodium: 138 mEq/L (ref 136–145)

## 2021-03-23 LAB — BLOOD GAS, ARTERIAL
Arterial Total CO2: 42.1 mEq/L — ABNORMAL HIGH (ref 24.0–30.0)
Base Excess, Arterial: -4.1 mEq/L — ABNORMAL LOW (ref <=2.0)
ETCO2: 0
FIO2: 0 %
HCO3, Arterial: 20.7 mEq/L — ABNORMAL LOW (ref 23.0–29.0)
Minute Ventilation: 0 L/min
Mode:: 0
O2 Flow: 2 L/min
O2 Sat, Arterial: 98.7 % (ref 95.0–100.0)
PEEP: 0
Pressure Control: 0
Pressure Support: 0
Rate: 0 {beats}/min
Temperature: 37
Tidal vol.: 0
pCO2, Arterial: 39 mmHg (ref 35.0–45.0)
pH, Arterial: 7.345 — ABNORMAL LOW (ref 7.350–7.450)
pO2, Arterial: 169 mmHg — ABNORMAL HIGH (ref 80.0–90.0)

## 2021-03-23 LAB — CBC
Absolute NRBC: 0 10*3/uL (ref 0.00–0.00)
Hematocrit: 37.2 % (ref 34.7–43.7)
Hgb: 12 g/dL (ref 11.4–14.8)
MCH: 29.4 pg (ref 25.1–33.5)
MCHC: 32.3 g/dL (ref 31.5–35.8)
MCV: 91.2 fL (ref 78.0–96.0)
MPV: 10.6 fL (ref 8.9–12.5)
Nucleated RBC: 0 /100 WBC (ref 0.0–0.0)
Platelets: 295 10*3/uL (ref 142–346)
RBC: 4.08 10*6/uL (ref 3.90–5.10)
RDW: 14 % (ref 11–15)
WBC: 10.44 10*3/uL — ABNORMAL HIGH (ref 3.10–9.50)

## 2021-03-23 LAB — TROPONIN I
Troponin I: <0.01 ng/mL (ref 0.00–0.05)
Troponin I: <0.01 ng/mL (ref 0.00–0.05)

## 2021-03-23 LAB — SEDIMENTATION RATE: Sed Rate: 26 mm/Hr — ABNORMAL HIGH (ref 0–20)

## 2021-03-23 LAB — IHS D-DIMER: D-Dimer: 0.81 ug/mL FEU — ABNORMAL HIGH (ref 0.00–0.60)

## 2021-03-23 LAB — C-REACTIVE PROTEIN: C-Reactive Protein: 6.2 mg/dL — ABNORMAL HIGH (ref 0.0–0.8)

## 2021-03-23 LAB — PT/INR
PT INR: 0.9 (ref 0.9–1.1)
PT: 10.2 s (ref 10.1–12.9)

## 2021-03-23 LAB — GFR: EGFR: >60

## 2021-03-23 LAB — RHEUMATOID FACTOR: Rheumatoid Factor: <13 (ref 0.0–30.0)

## 2021-03-23 LAB — PROCALCITONIN: Procalcitonin: 0.02 (ref 0.00–0.10)

## 2021-03-23 MED ORDER — FAMOTIDINE 20 MG PO TABS
20.0000 mg | ORAL_TABLET | Freq: Two times a day (BID) | ORAL | Status: DC
Start: 2021-03-23 — End: 2021-03-26
  Administered 2021-03-23 – 2021-03-26 (×6): 20 mg via ORAL
  Filled 2021-03-23 (×7): qty 1

## 2021-03-23 MED ORDER — NICOTINE 14 MG/24HR TD PT24
1.0000 | MEDICATED_PATCH | Freq: Every day | TRANSDERMAL | Status: DC
Start: 2021-03-23 — End: 2021-03-31
  Administered 2021-03-23 – 2021-03-31 (×10): 1 via TRANSDERMAL
  Filled 2021-03-23 (×12): qty 1

## 2021-03-23 MED ORDER — GABAPENTIN 400 MG PO CAPS
800.0000 mg | ORAL_CAPSULE | Freq: Three times a day (TID) | ORAL | Status: DC | PRN
Start: 2021-03-23 — End: 2021-03-24
  Administered 2021-03-24: 800 mg via ORAL
  Filled 2021-03-23: qty 2

## 2021-03-23 MED ORDER — ALBUTEROL-IPRATROPIUM 2.5-0.5 (3) MG/3ML IN SOLN
3.0000 mL | Freq: Four times a day (QID) | RESPIRATORY_TRACT | Status: AC
Start: 2021-03-23 — End: 2021-03-24
  Administered 2021-03-23 (×3): 3 mL via RESPIRATORY_TRACT
  Filled 2021-03-23 (×4): qty 3

## 2021-03-23 MED ORDER — MONTELUKAST SODIUM 10 MG PO TABS
10.0000 mg | ORAL_TABLET | Freq: Every evening | ORAL | Status: DC
Start: 2021-03-23 — End: 2021-03-31
  Administered 2021-03-23 – 2021-03-30 (×8): 10 mg via ORAL
  Filled 2021-03-23 (×8): qty 1

## 2021-03-23 MED ORDER — GUAIFENESIN 100 MG/5ML PO SOLN
200.0000 mg | ORAL | Status: DC | PRN
Start: 2021-03-23 — End: 2021-03-31
  Administered 2021-03-23 – 2021-03-29 (×8): 200 mg via ORAL
  Filled 2021-03-23 (×8): qty 10

## 2021-03-23 MED ORDER — ONDANSETRON HCL 4 MG/2ML IJ SOLN
4.0000 mg | Freq: Four times a day (QID) | INTRAMUSCULAR | Status: DC | PRN
Start: 2021-03-23 — End: 2021-03-31

## 2021-03-23 MED ORDER — DEXTROSE 10 % IV BOLUS
25.0000 g | INTRAVENOUS | Status: DC | PRN
Start: 2021-03-23 — End: 2021-03-31

## 2021-03-23 MED ORDER — GABAPENTIN 400 MG PO CAPS
800.0000 mg | ORAL_CAPSULE | Freq: Three times a day (TID) | ORAL | Status: DC | PRN
Start: 2021-03-23 — End: 2021-03-23

## 2021-03-23 MED ORDER — DEXTROSE 5% IV BOLUS
250.0000 mL | INTRAVENOUS | Status: DC | PRN
Start: 2021-03-23 — End: 2021-03-31

## 2021-03-23 MED ORDER — LIDOCAINE 5 % EX PTCH
1.0000 | MEDICATED_PATCH | CUTANEOUS | Status: DC
Start: 2021-03-23 — End: 2021-03-31
  Administered 2021-03-24 – 2021-03-31 (×8): 1 via TRANSDERMAL
  Filled 2021-03-23 (×9): qty 1

## 2021-03-23 MED ORDER — DEXTROSE 50 % IV SOLN
25.0000 g | INTRAVENOUS | Status: DC | PRN
Start: 2021-03-23 — End: 2021-03-31

## 2021-03-23 MED ORDER — SODIUM CHLORIDE 0.9 % IV SOLN
1.0000 g | Freq: Three times a day (TID) | INTRAVENOUS | Status: DC
Start: 2021-03-23 — End: 2021-03-24
  Administered 2021-03-24: 1 g via INTRAVENOUS
  Filled 2021-03-23 (×3): qty 1

## 2021-03-23 MED ORDER — NALOXONE HCL 0.4 MG/ML IJ SOLN (WRAP)
0.2000 mg | INTRAMUSCULAR | Status: DC | PRN
Start: 2021-03-23 — End: 2021-03-31

## 2021-03-23 MED ORDER — IOHEXOL 350 MG/ML IV SOLN
85.0000 mL | Freq: Once | INTRAVENOUS | Status: AC | PRN
Start: 2021-03-23 — End: 2021-03-23
  Administered 2021-03-23: 85 mL via INTRAVENOUS

## 2021-03-23 MED ORDER — SODIUM CHLORIDE 0.9 % IV SOLN
INTRAVENOUS | Status: AC
Start: 2021-03-23 — End: 2021-03-24

## 2021-03-23 MED ORDER — ONDANSETRON 4 MG PO TBDP
4.0000 mg | ORAL_TABLET | Freq: Four times a day (QID) | ORAL | Status: DC | PRN
Start: 2021-03-23 — End: 2021-03-31

## 2021-03-23 MED ORDER — HEPARIN SODIUM (PORCINE) 5000 UNIT/ML IJ SOLN
5000.0000 [IU] | Freq: Three times a day (TID) | INTRAMUSCULAR | Status: DC
Start: 2021-03-23 — End: 2021-03-31
  Administered 2021-03-23 – 2021-03-31 (×20): 5000 [IU] via SUBCUTANEOUS
  Filled 2021-03-23 (×27): qty 1

## 2021-03-23 MED ORDER — DIPHENHYDRAMINE HCL 50 MG/ML IJ SOLN
25.0000 mg | Freq: Once | INTRAMUSCULAR | Status: AC | PRN
Start: 2021-03-23 — End: 2021-03-23
  Administered 2021-03-23: 25 mg via INTRAVENOUS
  Filled 2021-03-23 (×2): qty 1

## 2021-03-23 MED ORDER — ACETAMINOPHEN 650 MG RE SUPP
650.0000 mg | Freq: Four times a day (QID) | RECTAL | Status: DC | PRN
Start: 2021-03-23 — End: 2021-03-31

## 2021-03-23 MED ORDER — GLUCAGON 1 MG IJ SOLR (WRAP)
1.0000 mg | INTRAMUSCULAR | Status: DC | PRN
Start: 2021-03-23 — End: 2021-03-31

## 2021-03-23 MED ORDER — OXYCODONE HCL 5 MG PO TABS
5.0000 mg | ORAL_TABLET | ORAL | Status: DC | PRN
  Administered 2021-03-23 (×2): 5 mg via ORAL
  Filled 2021-03-23 (×2): qty 1

## 2021-03-23 MED ORDER — METHYLPREDNISOLONE SODIUM SUCC 125 MG IJ SOLR
80.0000 mg | Freq: Three times a day (TID) | INTRAMUSCULAR | Status: DC
Start: 2021-03-24 — End: 2021-03-26
  Administered 2021-03-24 – 2021-03-26 (×8): 80 mg via INTRAVENOUS
  Filled 2021-03-23 (×8): qty 2

## 2021-03-23 MED ORDER — MELATONIN 3 MG PO TABS
3.0000 mg | ORAL_TABLET | Freq: Every evening | ORAL | Status: DC | PRN
Start: 2021-03-23 — End: 2021-03-31

## 2021-03-23 MED ORDER — CETIRIZINE HCL 10 MG PO TABS
10.0000 mg | ORAL_TABLET | Freq: Every day | ORAL | Status: DC
Start: 2021-03-23 — End: 2021-03-31
  Administered 2021-03-23 – 2021-03-31 (×10): 10 mg via ORAL
  Filled 2021-03-23 (×10): qty 1

## 2021-03-23 MED ORDER — METHYLPREDNISOLONE SODIUM SUCC 40 MG IJ SOLR
40.0000 mg | Freq: Three times a day (TID) | INTRAMUSCULAR | Status: DC
Start: 2021-03-24 — End: 2021-03-23

## 2021-03-23 MED ORDER — ACETAMINOPHEN 325 MG PO TABS
650.0000 mg | ORAL_TABLET | Freq: Four times a day (QID) | ORAL | Status: DC | PRN
Start: 2021-03-23 — End: 2021-03-31
  Administered 2021-03-23 – 2021-03-29 (×3): 650 mg via ORAL
  Filled 2021-03-23 (×3): qty 2

## 2021-03-23 MED ORDER — OXYCODONE HCL 5 MG PO TABS
5.0000 mg | ORAL_TABLET | Freq: Four times a day (QID) | ORAL | Status: DC | PRN
  Administered 2021-03-23 (×2): 5 mg via ORAL
  Filled 2021-03-23 (×2): qty 1

## 2021-03-23 MED ORDER — METHYLPREDNISOLONE SODIUM SUCC 125 MG IJ SOLR
60.0000 mg | Freq: Three times a day (TID) | INTRAMUSCULAR | Status: DC
Start: 2021-03-24 — End: 2021-03-23

## 2021-03-23 NOTE — H&P (Signed)
[facility]      Patient: [name]  Date: 03/22/2021   DOB: 07/09/1971  Admission Date: 03/22/2021   MRN: [id number]  Attending: Sheppard Coil, MD         Cc;  1. SOB     History Gathered From: Patient and ED physician.  I also reviewed discharge note dated on 10/10/2020.    HISTORY AND PHYSICAL     BRENDY FICEK is a 50 y.o. 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      Patient states that she is having shortness of breath since this morning, associated with wheezing, cough (unable to cough out mucus) and chest tightness.  Patient also reports seasonal allergies, admits to smoking 1 pack of cigarettes daily, denies pets at home.  Patient states that she is not COVID vaccinated.  Patient denies prior intubation, however admitted with COPD exacerbation in November, 2021.  Patient states that she takes Xanax, high dose of gabapentin 800 mg 3 times daily and high dose of Percocet for back pain daily. Patient denies fever, chills, palpitation, nausea, vomiting, change in urinary/bowel habit.    Past Medical History:   Diagnosis Date   . Anxiety    . Chronic obstructive pulmonary disease    . Panic attacks        Past Surgical History:   Procedure Laterality Date   . APPENDECTOMY (OPEN)     . ARTHROSCOPIC KNEE, ACL RECONSTRUCTION      R. knee    . CHOLECYSTECTOMY         Prior to Admission medications    Medication Sig Start Date End Date Taking? Authorizing Provider   albuterol (PROVENTIL HFA;VENTOLIN HFA) 108 (90 Base) MCG/ACT inhaler Inhale 2 puffs into the lungs every 4 (four) hours as needed for Wheezing 02/04/19   Winfred, Carolan Clines, MD   ALPRAZolam Duanne Moron) 0.5 MG tablet Take 0.5 mg by mouth 2 (two) times daily as needed       [provider]   atomoxetine  (STRATTERA) 10 MG capsule Take 80 mg by mouth daily       [provider]   benzonatate (TESSALON) 200 MG capsule Take 1 capsule (200 mg total) by mouth 3 (three) times daily as needed for Cough 10/09/20   Morton Amy, MD   cyclobenzaprine (FLEXERIL) 10 MG tablet Take 1 tablet (10 mg total) by mouth 3 (three) times daily as needed for Muscle spasms 06/05/19   Harlene Salts, PA   famotidine (PEPCID) 20 MG tablet Take 1 tablet (20 mg total) by mouth every 12 (twelve) hours 02/04/19   Winfred, Carolan Clines, MD   fluticasone-salmeterol (ADVAIR DISKUS) 500-50 MCG/DOSE Aerosol Powder, Breath Activtivatede Inhale 1 puff into the lungs 2 (two) times daily 02/04/19   Winfred, Carolan Clines, MD   furosemide (LASIX) 20 MG tablet Take 1 tablet (20 mg total) by mouth 2 (two) times daily 11/29/19   Forest Becker, MD   gabapentin (NEURONTIN) 800 MG tablet Take 800 mg by mouth 3 (three) times daily    [provider]   naloxone (NARCAN) 4 MG/0.1ML nasal spray 1 spray intranasally. If pt does not respond or relapses into respiratory depression call 911. Give additional doses every 2-3 min. 10/09/20   Morton Amy, MD   oxybutynin (  DITROPAN) 5 MG tablet Take 1 tablet (5 mg total) by mouth 3 (three) times daily  Patient taking differently: Take 10 mg by mouth 3 (three) times daily    11/29/19   Forest Becker, MD   oxyCODONE-acetaminophen (PERCOCET) 10-325 MG per tablet Take 1 tablet by mouth every 4 (four) hours as needed for Pain 10/10/20   Morton Amy, MD   risperiDONE (RISPERDAL) 3 MG tablet Take 3 mg by mouth 2 (two) times daily    [provider]   sertraline (ZOLOFT) 100 MG tablet Take 100 mg by mouth 2 (two) times daily       [provider]       Allergies   Allergen Reactions   . Contrast [Iodinated Diagnostic Agents]        CODE STATUS: Full code.    PRIMARY CARE MD: Virgel Bouquet, MD    Family History   Problem Relation Age of Onset   . Heart attack Father    . Diabetes  Father    . Asthma Sister    . Diabetes Sister    . Cancer Maternal Grandmother    . Cancer Paternal Grandmother    . Heart disease Paternal Grandmother    . Diabetes Mother    . Cancer Maternal Grandfather    . Cancer Paternal Grandfather      Social history:  Patient smokes 1 pack of cigarettes daily, drinks alcohol rarely, denies using illicit drugs.    REVIEW OF SYSTEMS   Positive for: As in HPI.  Negative for: As in HPI.    All ROS completed and otherwise negative.    PHYSICAL EXAM     Vital Signs (most recent): BP 102/55   Pulse 86   Temp 98.5 F (36.9 C) (Oral)   Resp (!) 26   Ht 1.753 m (_0 )   Wt 127 kg (280 lb)   SpO2 97%   BMI 41.35 kg/m   Constitutional: In resp distress with audible wheezing.   HEENT: NC/AT, no scleral icterus or conjunctival pallor, no nasal discharge, MMM.  Neck: Trachea midline, supple, no cervical or supraclavicular lymphadenopathy or masses.  Cardiovascular: RRR, normal S1 S2, no murmurs, gallops, palpable thrills, no JVD.  Respiratory: Tachypnea.  Diffuse audible wheezes without stethoscope.  Gastrointestinal: +BS, non-distended, soft, non-tender, no rebound or guarding.  Genitourinary: No suprapubic or costovertebral angle tenderness.  Musculoskeletal: ROM and motor strength grossly normal.   Skin exam: Normal.  Neurologic: No gross motor or sensory deficits.  Psychiatric: AAOx3, affect and mood appropriate. The patient is alert, interactive, appropriate.  Capillary refill: Normal.    Exam done by Sheppard Coil, MD on 03/23/21 at 12:02 AM      LABS & IMAGING     Recent Results (from the past 24 hour(s))   CBC and differential    Collection Time: 03/22/21  7:49 PM   Result Value Ref Range    WBC 10.78 (H) 3.10 - 9.50 x10 3/uL    Hgb 12.1 11.4 - 14.8 g/dL    Hematocrit 37.0 34.7 - 43.7 %    Platelets 283 142 - 346 x10 3/uL    RBC 4.16 3.90 - 5.10 x10 6/uL    MCV 88.9 78.0 - 96.0 fL    MCH 29.1 25.1 - 33.5 pg    MCHC 32.7 31.5 - 35.8 g/dL    RDW 14 11 - 15 %    MPV 10.0  8.9 - 12.5 fL    Neutrophils  67.9 None %    Lymphocytes Automated 20.2 None %    Monocytes 4.3 None %    Eosinophils Automated 6.5 None %    Basophils Automated 0.7 None %    Immature Granulocytes 0.4 None %    Nucleated RBC 0.0 0.0 - 0.0 /100 WBC    Neutrophils Absolute 7.32 (H) 1.10 - 6.33 x10 3/uL    Lymphocytes Absolute Automated 2.18 0.42 - 3.22 x10 3/uL    Monocytes Absolute Automated 0.46 0.21 - 0.85 x10 3/uL    Eosinophils Absolute Automated 0.70 (H) 0.00 - 0.44 x10 3/uL    Basophils Absolute Automated 0.08 0.00 - 0.08 x10 3/uL    Immature Granulocytes Absolute 0.04 0.00 - 0.07 x10 3/uL    Absolute NRBC 0.00 0.00 - 0.00 x10 3/uL   Comprehensive metabolic panel    Collection Time: 03/22/21  7:49 PM   Result Value Ref Range    Glucose 114 (H) 70 - 100 mg/dL    BUN 9.0 7.0 - 19.0 mg/dL    Creatinine 0.6 0.6 - 1.0 mg/dL    Sodium 137 136 - 145 mEq/L    Potassium 4.5 3.5 - 5.1 mEq/L    Chloride 105 100 - 111 mEq/L    CO2 23 22 - 29 mEq/L    Calcium 9.2 8.5 - 10.5 mg/dL    Protein, Total 5.5 (L) 6.0 - 8.3 g/dL    Albumin 2.9 (L) 3.5 - 5.0 g/dL    AST (SGOT) 13 5 - 34 U/L    ALT 10 0 - 55 U/L    Alkaline Phosphatase 96 37 - 117 U/L    Bilirubin, Total 0.5 0.2 - 1.2 mg/dL    Globulin 2.6 2.0 - 3.6 g/dL    Albumin/Globulin Ratio 1.1 0.9 - 2.2    Anion Gap 9.0 5.0 - 15.0   Troponin I    Collection Time: 03/22/21  7:49 PM   Result Value Ref Range    Troponin I <0.01 0.00 - 0.05 ng/mL   Beta HCG Quantitative    Collection Time: 03/22/21  7:49 PM   Result Value Ref Range    hCG, Quant. <2.4 See Below   GFR    Collection Time: 03/22/21  7:49 PM   Result Value Ref Range    EGFR >60.0    B-type Natriuretic Peptide    Collection Time: 03/22/21  7:49 PM   Result Value Ref Range    B-Natriuretic Peptide 67 0 - 100 pg/mL   COVID-19 (SARS-CoV-2) and Influenza A/B, NAA ([facility] Rapid)- Admission    Collection Time: 03/22/21 10:27 PM    Specimen: Nasopharyngeal; Culturette   Result Value Ref Range    Purpose of COVID testing  Diagnostic -[facility]     SARS-CoV-2 Specimen Source Nasal Swab     SARS CoV 2 Overall Result Not Detected     Influenza A Not Detected     Influenza B Not Detected        MICROBIOLOGY:  Blood Culture: P  Urine Culture: NA  Antibiotics Started: Y    IMAGING:  Upon my review: As below    CARDIAC:  EKG Interpretation (upon my review):  As below    Markers:  Recent Labs   Lab 03/22/21  1949   Troponin I <0.01       EMERGENCY DEPARTMENT COURSE:  Orders Placed This Encounter   Procedures   . COVID-19 (SARS-CoV-2) and Influenza A/B, NAA ([facility])- Admission   . Culture Blood  Aerobic and Anaerobic   . Culture Blood Aerobic and Anaerobic   . Chest AP Portable   . CT Chest without Contrast   . CBC and differential   . Comprehensive metabolic panel   . Troponin I   . Beta HCG Quantitative   . GFR   . B-type Natriuretic Peptide   . Arterial Blood Gas (ABG)   . Basic Metabolic Panel   . CBC and differential   . Prothrombin time/INR   . Diet regular   . Vital Signs   . Vital signs   . Pulse Oximetry   . Progressive Mobility Protocol   . Notify physician   . NSG Communication: Glucose POCT order (PRN hypoglycemia)   . I/O   . Height   . Weight   . Skin assessment   . Nursing communication: Adult Hypoglycemia Treatment Algorithm   . Place sequential compression device   . Maintain sequential compression device   . Education: Activity   . Education: Disease Process & Condition   . Education: Pain Management   . Education: Falls Risk   . Education: Smoking Cessation   . Telemetry 24 Hour Protocol   . Full Code   . ECG 12 lead   . Saline lock IV   . Saline lock IV   . Admit to Inpatient       ASSESSMENT & PLAN     EDDYE BROXTERMAN is a 50 y.o. female with medical history of tobacco dependence, chronic respiratory failure secondary to COPD (O2 dependent at home-2L via nc), psychosis, anxiety/depression, OSA (not using CPAP, per patient broke), morbid obesity and chronic back pain admitted with Acute resp distress.    Patient Active  Hospital Problem List:    # Acute resp distress 2/2 COPD exacerbation: + Cough, dyspnea, tachypnea, congested chest, diffuse audible wheezes without stethoscope. DDx; Bronchitis/bronchectasis or atypical PNA. CT chest shows Patchy groundglass like opacities following the bronchovascular bundles throughout the lung fields bilaterally with DDx; NSIP, hypersensitivity and drug induced pneumonitis, and atypical pneumonia.  Marland Kitchen COVID and Influenza are negative.   # Chronic respiratory failure secondary to COPD (O2 dependent at home-2L via nc)  # Chest tightness with cough/dyspnea  # Hx of OSA: Not using CPAP, per patient broke  # ? Pulmonary HTN per CT chest  # Morbid obesity: BMI >41.0  # Polypharmacy dependent; Patient states that she takes Xanax, high dose of gabapentin 800 mg 3 times daily and high dose of Percocet for back pain daily.   # Chronic back pain   # Tobacco dependence    # Hx of psychosis and anxiety/depression    -Reviewed chest x-ray.  -CT chest: Per report, * Patchy groundglass like opacities following the bronchovascular bundles throughout the lung fields bilaterally, have increased from the prior study. Differential diagnosis includes but is not limited to NSIP, hypersensitivity and drug induced pneumonitis, and atypical pneumonia. Stable mild cylindrical bronchiectasis, with parenchymal scarring and honeycombing within the lung apices. * Enlarged main pulmonary trunk is suggestive of underlying pulmonary arterial hypertension.  -Reviewed ECG: NSR.  No ST elevation.    -Will send stat ABG if need BiPAP.  -Pulse oximetry and supplemental oxygen via nasal cannula to keep SPO2 between 89-93%.  -Patient received Solu-Medrol 125 mg along with ceftriaxone and azithromycin. Blood culture sent by ED. Will continue Solu-Medrol 40 mg 3 times daily. Starting Ceftazidime (bronchiectasis on CT), sending sputum cultures.  -Starting duo nebs treatment scheduled every 6 hourly x4, can switch  to as needed once patient  stable.  -Antitussives as needed.  -Will need pulmonary consult.   -Starting Zyrtec and Singulair daily.    -Telemetry monitoring.  -Troponin and BMP are unremarkable.  Will trend troponin.    -Educated patient to talk to her primary care doctor to expedite getting CPAP.  Patient will also need outpatient work-up for pulmonary arterial hypertension.    -Lifestyle modification with diet and exercise.  Patient will need nutrition consult as well as pharmacological options as outpatient.    -Medications Xanax, atomoxetine, risperidone and sertraline while decreasing dose of gabapentin from 800 mg 3 times daily to 300 mg 3 times daily, also holding high-dose Percocet to Tylenol as needed for mild and oxycodone as needed for moderate pain.  Patient received morphine 4 mg IV x2 in ED.  Adding Lidoderm patch daily.    -Smoking cessation education provided, patient agreed NRT.     Nutrition  Regular    DVT/VTE Prophylaxis  SCD/Heparin     Anticipated medical stability for discharge: 1-2 days.    Service status/Reason for ongoing hospitalization: Inpatient/Acute resp distress.   The appropriate admission status for this patient is inpatient. I certify that at the point of admission it is my clinical judgment that the patient will require inpatient hospital care spanning beyond 2 midnights from the point of admission due to high intensity of service, high risk for further deterioration and high frequency of surveillance required.  Anticipated Discharge Needs: To be determined.    Signed,  Sheppard Coil    Total time: 60 min, spent in patient's interview, patient examination, review and discussion of imaging, counseling, coordination of patient care, review and completion of medical records.    This note was generated by the Epic EMR system/ Dragon speech recognition and may contain inherent errors or omissions not intended by the user.

## 2021-03-23 NOTE — Progress Notes (Signed)
Patient safely transferred to unit 21 from ED. Report received from Epic. VSS. ID band verified and oriented to unit. Pt reports a pain score of 8/10. Educated on importance of call bell use for pain and toileting needs. All belongings in room. All high fall risk precautions in place.

## 2021-03-23 NOTE — Progress Notes (Signed)
[facility]  PROGRESS NOTE      Patient: [name]  Date: 03/23/2021   LOS: 1 Days  Admission Date: 03/22/2021   MRN: [id number]  Attending: Dr. Romana Juniper Blair Hailey, PA-C  Please contact me on secure chat or the following Spectralink [phone]       ASSESSMENT/PLAN     MARRIETTA [name] is a 50 y.o. female with a PMHx of tobacco dependence, chronic respiratory failure secondary to COPD (O2 dependent on 2 L via NC), history of psychosis, anxiety/depression, OSA (not using CPAP) morbid obesity, and chronic back pain admitted with acute COPD exacerbation.    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  Enlarged main pulmonary trunk suggestive of underlying pulmonary arterial hypertension.  -Increase steroids 80 mg every 8 hours  -Titrate oxygen to maintain O2 saturations 88 to 94%  -Continue ceftazidime  -Continue DuoNebs every 6 hours  -Antitussives as needed  -We will check D-dimer  -Appreciate pulmonology evaluation, check BNP echo and serial troponins to rule out cardiac etiology.  Additionally work-up ordered for underlying connective tissue disease-ESR, ANA, Rh factor.  Respiratory culture and viral panel pending.   -Continue educating on the importance of smoking cessation    Chronic back pain  -Continue gabapentin and oxycodone    History of OSA  -Outpatient sleep study     Morbid obesity  -Lifestyle modification with diet and exercise on discharge    Anxiety/depression  -Continue home Xanax, atomoxetine, risperidone, and sertraline    Tobacco dependence  -Cessation advised, continue nicotine patch-    GERD  -Pepcid added     Dysuria  -Check  UA      Analgesia: Tylenol, oxycodone, gabapentin    Nutrition: regular    DVT Prophylaxis:Heparin       Code Status: Full    DISPO: Pending improvement in respiratory symptoms. Likely 2-3 days.       SUBJECTIVE     [name] states ongoing shortness of breath with chest tightness this morning. Minimally improvement following duo-nebs. Endorses chronic back pain. No fever, chills, SOB, chest pain, N/V/D. +dysuria    MEDICATIONS     Current Facility-Administered Medications   Medication Dose Route Frequency   . albuterol-ipratropium  3 mL Nebulization Q6H [place]   . atomoxetine  80 mg Oral Daily   . cefTAZidime  1 g Intravenous Q8H   . cetirizine  10 mg Oral Daily   . famotidine  20 mg Oral BID   . heparin (porcine)  5,000 Units Subcutaneous Q8H [place]   . lidocaine  1 patch Transdermal Q24H   . [START ON 03/24/2021] methylPREDNISolone  80 mg Intravenous TID   . montelukast  10 mg Oral QHS   . nicotine  1 patch Transdermal Daily   . oxybutynin  10 mg Oral TID   . risperiDONE  3 mg Oral BID   . sertraline  100 mg Oral BID       PHYSICAL EXAM     Vitals:    03/23/21 1121   BP: 110/77   Pulse: 96   Resp: 20   Temp: 98.4 F (36.9 C)   SpO2: 94%       Temperature:  Temp  Min: 97.7 F (36.5 C)  Max: 98.5 F (36.9 C)  Pulse: Pulse  Min: 77  Max: 96  Respiratory: Resp  Min: 19  Max: 26  Non-Invasive BP: BP  Min: 98/67  Max: 124/78  Pulse Oximetry SpO2  Min: 92 %  Max: 97 %    Intake and Output Summary (Last 24 hours) at Date Time    Intake/Output Summary (Last 24 hours) at 03/23/2021 1626  Last data filed at 03/23/2021 1022  Gross per 24 hour   Intake 436.67 ml   Output 200 ml   Net 236.67 ml       GEN APPEARANCE: Mild distress; Appears uncomfortable, A&OX3  HEENT: PERLA; EOMI; Conjunctiva Clear  NECK: Supple; No bruits  CVS: RRR, S1, S2; No M/G/R  LUNGS: Coarse breath sounds with expiratory wheezing bilaterally  ABD: Obese abdomen, Soft; No TTP; + Normoactive BS  EXT: No edema; Pulses 2+ and intact  Skin exam:  no  pallor  NEURO: CN 2-12 intact; No Focal neurological deficits  CAP REFILL:  Normal  MENTAL STATUS:  Depressed    Exam done by Edwin Dada, PA on 03/23/21 at 11:32 AM      LABS     Recent Labs   Lab 03/23/21  0629 03/22/21  1949   WBC 10.44* 10.78*   RBC 4.08 4.16   Hgb 12.0 12.1   Hematocrit 37.2 37.0   MCV 91.2 88.9   Platelets 295 283       Recent Labs   Lab 03/23/21  0306 03/22/21  1949   Sodium 138 137   Potassium 5.0 4.5   Chloride 105 105   CO2 20* 23   BUN 8.0 9.0   Creatinine 0.6 0.6   Glucose 161* 114*   Calcium 9.1 9.2       Recent Labs   Lab 03/22/21  1949   ALT 10   AST (SGOT) 13   Bilirubin, Total 0.5   Albumin 2.9*   Alkaline Phosphatase 96       Recent Labs   Lab 03/23/21  0306 03/22/21  1949   Troponin I <0.01 <0.01       Recent Labs   Lab 03/23/21  0306   PT INR 0.9   PT 10.2       Microbiology Results (last 15 days)     Procedure Component Value Units Date/Time    Respiratory Pathogen Panel w/COVID-19, PCR [[id number]]     Order Status: Sent Specimen: Nasopharyngeal     CULTURE + Lacretia Leigh [[id number]]     Order Status: Sent Specimen: Sputum, Expectorated     Culture Blood Aerobic and Anaerobic [[id number]] Collected: 03/22/21 2349    Order Status: Sent Specimen: Arm from Blood, Venipuncture Updated: 03/23/21 0529    Narrative:      1 BLUE+1 PURPLE    Culture Blood Aerobic and Anaerobic [[id number]] Collected: 03/22/21 2349    Order Status: Sent Specimen: Arm from Blood, Venipuncture Updated: 03/23/21 0529    Narrative:      1 BLUE+1 PURPLE    COVID-19 (SARS-CoV-2) and Influenza A/B, NAA ([facility])- Admission [[id number]] Collected: 03/22/21 2227    Order Status: Completed Specimen: Culturette from Nasopharyngeal Updated: 03/22/21 2304     Purpose of COVID testing Diagnostic -[facility]     SARS-CoV-2 Specimen Source Nasal Swab     SARS CoV 2 Overall Result Not Detected     Comment: __________________________________________________  -A result of "Detected" indicates POSITIVE  for the     presence of SARS CoV-2 RNA  -A result of "Not Detected" indicates NEGATIVE for the    presence of SARS CoV-2 RNA  __________________________________________________________  Test performed using the Roche cobas [place] SARS-CoV-2 assay. This assay is  only for use under the Food and Drug Administration's Emergency Use  Authorization. This is a real-time RT-PCR assay for the qualitative  detection of SARS-CoV-2 RNA. Viral nucleic acids may persist in vivo,  independent of viability. Detection of viral nucleic acid does not imply the  presence of infectious virus, or that virus nucleic acid is the cause of  clinical symptoms. Negative results do not preclude SARS-CoV-2 infection and  should not be used as the sole basis for diagnosis, treatment or other  patient management decisions. Negative results must be combined with  clinical observations, patient history, and/or epidemiological information.  Invalid results may be due to inhibiting substances in the specimen and  recollection should occur. Please see Fact Sheets for patients and providers  located:  https://www.[facility]-chung.com/          Influenza A Not Detected     Influenza B Not Detected     Comment: Test performed using the Roche cobas [place] SARS-CoV-2 & Influenza A/B assay.  This assay is only for use under the Food and Drug Administration's  Emergency Use Authorization. This is a multiplex real-time RT-PCR assay  intended for the simultaneous in vitro qualitative detection and  differentiation of SARS-CoV-2, influenza A, and influenza B virus RNA. Viral  nucleic acids may persist in vivo, independent of viability. Detection of  viral nucleic acid does not imply the presence of infectious virus, or that  virus nucleic acid is the cause of clinical symptoms. Negative results do  not preclude SARS-CoV-2, influenza A, and/or influenza B infection and  should not be used as the sole basis for diagnosis, treatment or other  patient management  decisions. Negative results must be combined with  clinical observations, patient history, and/or epidemiological information.  Invalid results may be due to inhibiting substances in the specimen and  recollection should occur. Please see Fact Sheets for patients and providers  located: http://[facility].info/.         Narrative:      o Collect and clearly label specimen type:  o PREFERRED-Upper respiratory specimen: One Nasal Swab in  Transport Media.  o Hand deliver to laboratory ASAP  Diagnostic -[facility]           RADIOLOGY     Upon my review:    Signed,  Edwin Dada, PA  4:26 PM 03/23/2021

## 2021-03-23 NOTE — Plan of Care (Signed)
Patient is alert and oriented x4. On 1L O2 via NC.  NSR on Tele.  Uses a walker,2 assist  Safety precautions maintained.      Problem: Safety  Goal: Patient will be free from injury during hospitalization  Outcome: Progressing  Flowsheets (Taken 03/23/2021 1636)  Patient will be free from injury during hospitalization:  . Assess patient's risk for falls and implement fall prevention plan of care per policy  . Use appropriate transfer methods  . Include patient/ family/ care giver in decisions related to safety  . Assess for patients risk for elopement and implement Elopement Risk Plan per policy  . Provide alternative method of communication if needed [employer], writing)  . Ensure appropriate safety devices are available at the bedside  . Hourly rounding  . Provide and maintain safe environment     Problem: Pain  Goal: Pain at adequate level as identified by patient  Outcome: Progressing  Flowsheets (Taken 03/23/2021 1636)  Pain at adequate level as identified by patient:  . Identify patient comfort function goal  . Assess for risk of opioid induced respiratory depression, including snoring/sleep apnea. Alert healthcare team of risk factors identified.  . Assess pain on admission, during daily assessment and/or before any "as needed" intervention(s)  . Reassess pain within 30-60 minutes of any procedure/intervention, per Pain Assessment, Intervention, Reassessment (AIR) Cycle  . Evaluate if patient comfort function goal is met  . Offer non-pharmacological pain management interventions  . Consult/collaborate with Physical Therapy, Occupational Therapy, and/or Speech Therapy  . Include patient/patient care companion in decisions related to pain management as needed     Problem: Compromised Hemodynamic Status  Goal: Vital signs and fluid balance maintained/improved  Outcome: Progressing  Flowsheets (Taken 03/23/2021 1636)  Vital signs and fluid balance are maintained/improved:  Marland Kitchen Position patient for maximum  circulation/cardiac output  . Monitor and compare daily weight  . Monitor/assess lab values and report abnormal values  . Monitor intake and output. Notify LIP if urine output is less than 30 mL/hour.  . Monitor/assess vitals and hemodynamic parameters with position changes

## 2021-03-23 NOTE — ED Notes (Signed)
[facility]  ED NURSING NOTE FOR THE RECEIVING INPATIENT NURSE   ED NURSE Katina Degree [phone]   ED CHARGE RN [phone]   ADMISSION INFORMATION   [name] is a 50 y.o. female admitted with a diagnosis of:    1. SOB (shortness of breath)         Isolation: None   Allergies: Contrast [iodinated diagnostic agents]   Holding Orders confirmed? Yes   Belongings Documented? Yes   Home medications sent to pharmacy confirmed? No   NURSING CARE   Patient Comes From:   Mental Status: Home Independent  alert and oriented   ADL: Independent with all ADLs   Ambulation: no difficulty   Pertinent Information  and Safety Concerns: N/A     COVID Test sent to lab? Yes   VITAL SIGNS   Time BP Temp Pulse Resp SpO2   0000 102/55 98.4 86 20 97%   CT / NIH   CT Head ordered on this patient?  No   NIH/Dysphagia assessment done prior to admission? No   PERSONAL PROTECTIVE EQUIPMENT   Face Shield, Gloves, N95, Geologist, engineering and Surgical / [employer] Cap   LAB RESULTS   Labs Reviewed   CBC AND DIFFERENTIAL - Abnormal; Notable for the following components:       Result Value    WBC 10.78 (*)     Neutrophils Absolute 7.32 (*)     Eosinophils Absolute Automated 0.70 (*)     All other components within normal limits   COMPREHENSIVE METABOLIC PANEL - Abnormal; Notable for the following components:    Glucose 114 (*)     Protein, Total 5.5 (*)     Albumin 2.9 (*)     All other components within normal limits   COVID-19 (SARS-COV-2) & INFLUENZA  A/B, NAA (ROCHE LIAT)    Narrative:     o Collect and clearly label specimen type:  o PREFERRED-Upper respiratory specimen: One Nasal Swab in  [employer].  o Hand deliver to laboratory ASAP  Diagnostic -PUI   CULTURE BLOOD AEROBIC AND ANAEROBIC    Narrative:     1 BLUE+1 PURPLE   CULTURE BLOOD AEROBIC AND ANAEROBIC    Narrative:     1 BLUE+1 PURPLE   TROPONIN I   HCG QUANTITATIVE   GFR   B-TYPE NATRIURETIC PEPTIDE   BLOOD GAS, ARTERIAL          Ticket to Ride Printed: Yes

## 2021-03-23 NOTE — Plan of Care (Signed)
Pt alert and oriented x4. VSS. SpO2 93% on 1L nc, SOB on exertion. NSR on tele. Pt reported pain 8/10, received prn Oxycodone and Gabapentin. Safety measures in place. Will continue to monitor.    Problem: Moderate/High Fall Risk Score >5  Goal: Patient will remain free of falls  Outcome: Progressing  Flowsheets (Taken 03/23/2021 0225)  High (Greater than 13):   HIGH-Consider use of low bed   HIGH-Initiate use of floor mats as appropriate   HIGH-Apply yellow "Fall Risk" arm band   HIGH-Bed alarm on at all times while patient in bed   HIGH-Visual cue at entrance to patient's room     Problem: Safety  Goal: Patient will be free from injury during hospitalization  Outcome: Progressing  Flowsheets (Taken 03/23/2021 0407)  Patient will be free from injury during hospitalization:   Assess patient's risk for falls and implement fall prevention plan of care per policy   Use appropriate transfer methods   Ensure appropriate safety devices are available at the bedside   Include patient/ family/ care giver in decisions related to safety   Assess for patients risk for elopement and implement Elopement Risk Plan per policy   Hourly rounding   Provide and maintain safe environment     Problem: Pain  Goal: Pain at adequate level as identified by patient  Outcome: Progressing  Flowsheets (Taken 03/23/2021 0407)  Pain at adequate level as identified by patient:   Identify patient comfort function goal   Assess pain on admission, during daily assessment and/or before any "as needed" intervention(s)   Evaluate if patient comfort function goal is met   Offer non-pharmacological pain management interventions   Assess for risk of opioid induced respiratory depression, including snoring/sleep apnea. Alert healthcare team of risk factors identified.   Reassess pain within 30-60 minutes of any procedure/intervention, per Pain Assessment, Intervention, Reassessment (AIR) Cycle   Evaluate patient's satisfaction with pain management progress      Problem: Compromised Hemodynamic Status  Goal: Vital signs and fluid balance maintained/improved  Outcome: Progressing  Flowsheets (Taken 03/23/2021 0407)  Vital signs and fluid balance are maintained/improved:   Position patient for maximum circulation/cardiac output   Monitor and compare daily weight   Monitor intake and output. Notify LIP if urine output is less than 30 mL/hour.   Monitor/assess vitals and hemodynamic parameters with position changes   Monitor/assess lab values and report abnormal values     Problem: Inadequate Gas Exchange  Goal: Adequate oxygenation and improved ventilation  Outcome: Progressing  Flowsheets (Taken 03/23/2021 0407)  Adequate oxygenation and improved ventilation:   Assess lung sounds   Monitor SpO2 and treat as needed   Provide mechanical and oxygen support to facilitate gas exchange   Increase activity as tolerated/progressive mobility   Plan activities to conserve energy: plan rest periods   Position for maximum ventilatory efficiency

## 2021-03-23 NOTE — Consults (Addendum)
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    We will do work-up for underlying connective tissue disease   check sed rate ANA,   Resp path viral panel  Trial of steroid 27m q 8.   Check BNP/echo rule out cardiac component .    Consider bronchoscopy if no improvement in 48 hours.    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    Plan:     As detailed above      History:         DKIYOMI PALLOis a 50y.o. female who presents to the hospital on 03/22/2021 with increased shortness of breath cough started few days prior to presentation denies fever chills nausea vomiting but has symptoms of reflux present emergency for evaluation CT of the chest showed bilateral groundglass opacity consistent with nonspecific pneumonitis.  I was contacted for evaluation.        Past Medical History:     Past Medical History:   Diagnosis Date   . Anxiety    . Chronic obstructive pulmonary disease    . Panic attacks        Past Surgical History:     Past Surgical History:   Procedure Laterality Date   . APPENDECTOMY (OPEN)     . ARTHROSCOPIC KNEE, ACL RECONSTRUCTION      R. knee    . CHOLECYSTECTOMY         Family History:     Family History   Problem Relation Age of Onset   . Heart attack Father    . Diabetes Father    . Asthma Sister    . Diabetes Sister    . Cancer Maternal Grandmother    . Cancer Paternal Grandmother    . Heart disease Paternal Grandmother    . Diabetes Mother    . Cancer Maternal Grandfather    . Cancer Paternal Grandfather        Social History:     Social History      Socioeconomic History   . Marital status: Single     Spouse name: Not on file   . Number of children: Not on file   . Years of education: Not on file   . Highest education level: Not on file   Occupational History   . Not on file   Tobacco Use   . Smoking status: Current Every Day Smoker     Packs/day: 0.25     Years: 25.00     Pack years: 6.25     Types: Cigarettes     Last attempt to quit: 12/29/2018     Years since quitting: 2.2   . Smokeless tobacco: Never Used   Vaping Use   . Vaping Use: Never used   Substance and Sexual Activity   . Alcohol use: Yes     Comment: special occasion   . Drug use: Yes     Types: Marijuana   . Sexual activity: Yes  Partners: Male     Birth control/protection: None     Comment: rarely   Other Topics Concern   . Not on file   Social History Narrative   . Not on file     Social Determinants of Health     Financial Resource Strain: Not on file   Food Insecurity: Not on file   Transportation Needs: Not on file   Physical Activity: Not on file   Stress: Not on file   Social Connections: Not on file   Intimate Partner Violence: Not on file   Housing Stability: Not on file       Allergies:     Allergies   Allergen Reactions   . Contrast [Iodinated Diagnostic Agents]        Hospital Medications:     Current Facility-Administered Medications   Medication Dose Route Frequency   . albuterol-ipratropium  3 mL Nebulization Q6H [place]   . atomoxetine  80 mg Oral Daily   . cefTAZidime  1 g Intravenous Q8H   . cetirizine  10 mg Oral Daily   . heparin (porcine)  5,000 Units Subcutaneous Q8H [place]   . lidocaine  1 patch Transdermal Q24H   . [START ON 03/24/2021] methylPREDNISolone  40 mg Intravenous TID   . montelukast  10 mg Oral QHS   . nicotine  1 patch Transdermal Daily   . oxybutynin  10 mg Oral TID   . risperiDONE  3 mg Oral BID   . sertraline  100 mg Oral BID       Home Medications:     Medications Prior to Admission   Medication Sig Dispense Refill Last Dose   . albuterol (PROVENTIL  HFA;VENTOLIN HFA) 108 (90 Base) MCG/ACT inhaler Inhale 2 puffs into the lungs every 4 (four) hours as needed for Wheezing 1 Inhaler 0    . ALPRAZolam (XANAX) 0.5 MG tablet Take 0.5 mg by mouth 2 (two) times daily as needed         . atomoxetine (STRATTERA) 10 MG capsule Take 80 mg by mouth 2 (two) times daily      . benzonatate (TESSALON) 200 MG capsule Take 1 capsule (200 mg total) by mouth 3 (three) times daily as needed for Cough 15 capsule 0    . cyclobenzaprine (FLEXERIL) 10 MG tablet Take 1 tablet (10 mg total) by mouth 3 (three) times daily as needed for Muscle spasms 6 tablet 0    . fluticasone-salmeterol (ADVAIR DISKUS) 500-50 MCG/DOSE Aerosol Powder, Breath Activtivatede Inhale 1 puff into the lungs 2 (two) times daily 1 puff 0    . gabapentin (NEURONTIN) 800 MG tablet Take 800 mg by mouth 3 (three) times daily      . oxybutynin (DITROPAN) 5 MG tablet Take 1 tablet (5 mg total) by mouth 3 (three) times daily (Patient taking differently: Take 10 mg by mouth 3 (three) times daily) 90 tablet 1    . oxyCODONE-acetaminophen (PERCOCET) 10-325 MG per tablet Take 1 tablet by mouth every 4 (four) hours as needed for Pain 20 tablet 0    . risperiDONE (RisperDAL) 3 MG tablet Take 3 mg by mouth 2 (two) times daily      . sertraline (ZOLOFT) 100 MG tablet Take 100 mg by mouth 2 (two) times daily         . naloxone (NARCAN) 4 MG/0.1ML nasal spray 1 spray intranasally. If pt does not respond or relapses into respiratory depression call 911. Give additional doses every 2-3  min. 2 each 0        Code Status:      full code    Review of Systems:        General ROS:  Afebrile no weight loss weight gain   ENT ROS:  No sore throat no nasal discharge   Endocrine ROS:  No fatigue   Respiratory ROS:   moderate shortness of breath, mild wheezing cough chest congestion    Cardiovascular ROS: Mild chest pain, no pain positive palpitation   Gastrointestinal ROS:  No nausea vomiting diarrhea.  No melanotic stool, positive for  heartburn   Genito-Urinary ROS:  No burning in the urine or hematuria   Musculoskeletal ROS:  No musculoskeletal deformities   Neurological ROS:  No stroke seizure disorder   Dermatological ROS:  No skin rash      Physical Exam:     Vitals:    03/23/21 1121   BP: 110/77   Pulse: 96   Resp: 20   Temp: 98.4 F (36.9 C)   SpO2: 94%         Intake/Output Summary (Last 24 hours) at 03/23/2021 1214  Last data filed at 03/23/2021 1022  Gross per 24 hour   Intake 436.67 ml   Output 200 ml   Net 236.67 ml           General appearance - no visible respiratory distress patient does not appear toxic  Mental status - Alert and oriented x3  Eyes - EOMI PERRLA  Nose - no nasal discharge  Mouth - mucous membrane is moist.  No evidence of sore throat  Neck - no JVD lymphadenopathy or thyromegaly  Chest -scattered wheezing bilaterally few crackles nasal cannula 2 L 90%  Heart - S1-S2 RRR no S3-S4 no murmur  Abdomen - soft nontender bowel sounds are normal with no hepatosplenomegaly  Neurological - no motor or sensory deficit  Extremities - no edema clubbing or cyanosis  Skin - no skin rash  Capillary refill time less than 3 seconds  Labs:       CBC w/Diff CMP   Recent Labs   Lab 03/23/21  0629 03/22/21  1949   WBC 10.44* 10.78*   Hgb 12.0 12.1   Hematocrit 37.2 37.0   Platelets 295 283   MCV 91.2 88.9   Neutrophils  --  67.9       PT/INR   Recent Labs   Lab 03/23/21  0306   PT INR 0.9       Recent Labs   Lab 03/23/21  0306 03/22/21  1949   Sodium 138 137   Potassium 5.0 4.5   Chloride 105 105   CO2 20* 23   BUN 8.0 9.0   Creatinine 0.6 0.6   Glucose 161* 114*   Calcium 9.1 9.2   Protein, Total  --  5.5*   Albumin  --  2.9*   AST (SGOT)  --  13   ALT  --  10   Alkaline Phosphatase  --  96   Bilirubin, Total  --  0.5      Glucose POCT   Recent Labs   Lab 03/23/21  0306 03/22/21  1949   Glucose 161* 114*        Recent Labs   Lab 03/23/21  0306 03/22/21  1949   Troponin I <0.01 <0.01             ABGs:    ABG CollectionSite   Date  Value  Ref Range Status   03/23/2021 Right Radl  Final     Allen's Test   Date Value Ref Range Status   03/23/2021 Yes  Final     pH, Arterial   Date Value Ref Range Status   03/23/2021 7.345 (L) 7.350 - 7.450 Final     pCO2, Arterial   Date Value Ref Range Status   03/23/2021 39.0 35.0 - 45.0 mmHg Final     pO2, Arterial   Date Value Ref Range Status   03/23/2021 169.0 (H) 80.0 - 90.0 mmHg Final     Comment:     critical po2 results called to _[id number]  Readback confirmed by _mh__ at___  03/23/2021  00:59       HCO3, Arterial   Date Value Ref Range Status   03/23/2021 20.7 (L) 23.0 - 29.0 mEq/L Final     Base Excess, Arterial   Date Value Ref Range Status   03/23/2021 -4.1 (L) -2.0 - 2.0 mEq/L Final     O2 Sat, Arterial   Date Value Ref Range Status   03/23/2021 98.7 95.0 - 100.0 % Final       Urinalysis        Invalid input(s): LEUKOCYTESUR      Rads:     Radiology Results (24 Hour)     Procedure Component Value Units Date/Time    CT Chest without Contrast [[id number]] Collected: 03/22/21 2251    Order Status: Completed Updated: 03/22/21 2305    Narrative:      Clinical History:    h/o copd, here for sob and cp, r/o pna    Technique:    CT CHEST WO CONTRAST axial CT scan of the chest was performed without  intravenous contrast as per departmental protocol. Coronal and sagittal  reformatted images were also submitted for review.    The following dose reduction techniques were utilized: automated  exposure control and/or adjustment of the mA and/or kV according to  patient size, and the use of iterative reconstruction technique.     Comparison:    CT scan of the chest dated 10/07/2020    Findings:    There are patchy groundglass like opacities following the  bronchovascular bundles throughout the lung fields bilaterally. There is  mild parenchymal scarring and honeycombing within the lung apices, not  significantly changed from the prior study. There is no pleural effusion  or pneumothorax. There is mild cylindrical  bronchiectasis bilaterally.  The airways are patent. The main pulmonary trunk is enlarged measuring  up to 3.7 cm in transverse dimension. The aorta is normal in caliber.  The heart is normal in size. There is no pericardial effusion. There is  no mediastinal lymphadenopathy. The esophagus is unremarkable.    Within the upper abdomen, the patient is status post cholecystectomy.  There is mild hepatic steatosis. Colonic diverticulosis is noted.    No acute osseous abnormalities are seen. There are multilevel  degenerative changes of the spine. The soft tissues are within normal  limits.      Impression:          Patchy groundglass like opacities following the bronchovascular bundles  throughout the lung fields bilaterally, have increased from the prior  study. Differential diagnosis includes but is not limited to NSIP,  hypersensitivity and drug induced pneumonitis, and atypical pneumonia.    Stable mild cylindrical bronchiectasis, with parenchymal scarring and  honeycombing within the lung apices.    Enlarged main pulmonary trunk is suggestive of  underlying pulmonary  arterial hypertension.    Status post cholecystectomy.    Timmie Foerster MD, MD   03/22/2021 11:02 PM    Chest AP Portable [[id number]] Collected: 03/22/21 1943    Order Status: Completed Updated: 03/22/21 1946    Narrative:      XR CHEST AP PORTABLE    CLINICAL INDICATION:   Chest Pain    COMPARISON: 10/07/2020    FINDINGS: The cardiomediastinal silhouette appears within normal size  limits for portable technique and patient positioning. There are changes  of pulmonary edema. There is no evidence for pleural effusion or  pneumothorax.      Impression:       Pulmonary edema.    [name], MD   03/22/2021 7:44 PM      .    Porfirio Mylar MD  03/23/2021  12:14 PM

## 2021-03-23 NOTE — UM Notes (Signed)
PATIENT NAME: [name],[name]   DOB: February 15, 1971   PMH:  has a past medical history of Anxiety, Chronic obstructive pulmonary disease, and Panic attacks.  PSH:  has a past surgical history that includes Cholecystectomy; APPENDECTOMY (OPEN); and ARTHROSCOPIC KNEE, ACL RECONSTRUCTION.      03/22/21 2356  Admit to Inpatient  Once        Diagnosis: Sob (Shortness Of Breath)              ADMISSION REVIEW   History of present illness: Pt is a 50 y.o. female who arrived to the ER (03/22/2021 at 1859) with C/OShortness of Breath and Chest Pain    50 y.o. female BIBA from home c/o SOB and chest tightness. Pt awoke with onset of symptoms at 5am. Pt admitted last month for pneumonia of left lung and COPD exacerbation. Pt normally on 2L O2 at home.       Arrival VS: Vitals BP: 124/78, Temp: 98.5 F (36.9 C), Temp Source: Oral, Heart Rate: 77, Resp Rate: (!) 26, SpO2: 93 %, Height: 175.3 cm (_0 ), Weight: 127 kg (280 lb)      Abnormal Labs: WBC 10.78, Glucose 114, Protein 5.5, Albumin 2.9,     ABG: pH 7.345, pO2 169.0, HCO3 20.7, Arterial total Co2 42.1, Base Excess -4.1    Diagnostics:    CT of chest:Pulmonary edema.  CXR:The cardiomediastinal silhouette appears within normal size   limits for portable technique and patient positioning. There are changes   of pulmonary edema. There is no evidence for pleural effusion or   pneumothorax.     Medications in ED: Morphine 61m IV, Zofran 42mIV, Proventil neb, Solumedrol IV, Rocephin IV, Zithromax IV, Morphine 83m59mV.     Admit to IP status, Dx  COPD Excerebration.     [age] 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      Patient states that she is having shortness of breath since this morning, associated with wheezing, cough (unable to cough out  mucus) and chest tightness.  Patient also reports seasonal allergies, admits to smoking 1 pack of cigarettes daily, denies pets at home.  Patient states that she is not COVID vaccinated.  Patient denies prior intubation, however admitted with COPD exacerbation in November, 2021.  Patient states that she takes Xanax, high dose of gabapentin 800 mg 3 times daily and high dose of Percocet for back pain daily. Patient denies fever, chills, palpitation, nausea, vomiting, change in urinary/bowel habit.    ASSESSMENT & PLAN     [name] a 50 50o. female with medical history of tobacco dependence, chronic respiratory failure secondary to COPD (O2 dependent at home-2L via nc), psychosis, anxiety/depression, OSA (not using CPAP, per patient broke), morbid obesity and chronic back pain admitted with Acute resp distress.    Patient Active Hospital Problem List:    # Acute resp distress 2/2 COPD exacerbation: + Cough, dyspnea, tachypnea, congested chest, diffuse audible wheezes without stethoscope. DDx; Bronchitis/bronchectasis or atypical PNA. CT chest shows Patchy groundglass like opacities following the bronchovascular bundles throughout the lung fields bilaterally with DDx; NSIP, hypersensitivity and drug induced pneumonitis, and atypical pneumonia.  . CMarland KitchenVID and Influenza are negative.   # Chronic respiratory failure secondary to COPD (O2 dependent at home-2L via nc)  #  Chest tightness with cough/dyspnea  # Hx of OSA: Not using CPAP, per patient broke  # ? Pulmonary HTN per CT chest  # Morbid obesity: BMI >41.0  # Polypharmacy dependent; Patient states that she takes Xanax, high dose of gabapentin 800 mg 3 times daily and high dose of Percocet for back pain daily.   # Chronic back pain   # Tobacco dependence    # Hx of psychosis and anxiety/depression    -Reviewed chest x-ray.  -CT chest: Per report, * Patchy groundglass like opacities following the bronchovascular bundles throughout the lung fields  bilaterally, have increased from the prior study. Differential diagnosis includes but is not limited to NSIP, hypersensitivity and drug induced pneumonitis, and atypical pneumonia. Stable mild cylindrical bronchiectasis, with parenchymal scarring and honeycombing within the lung apices. * Enlarged main pulmonary trunk is suggestive of underlying pulmonary arterial hypertension.  -Reviewed ECG: NSR.  No ST elevation.    -Will send stat ABG if need BiPAP.  -Pulse oximetry and supplemental oxygen via nasal cannula to keep SPO2 between 89-93%.  -Patient received Solu-Medrol 125 mg along with ceftriaxone and azithromycin. Blood culture sent by ED. Will continue Solu-Medrol 40 mg 3 times daily. Starting Ceftazidime (bronchiectasis on CT), sending sputum cultures.  -Starting duo nebs treatment scheduled every 6 hourly x4, can switch to as needed once patient stable.  -Antitussives as needed.  -Will need pulmonary consult.   -Starting Zyrtec and Singulair daily.    -Telemetry monitoring.  -Troponin and BMP are unremarkable.  Will trend troponin.    -Educated patient to talk to her primary care doctor to expedite getting CPAP.  Patient will also need outpatient work-up for pulmonary arterial hypertension.    -Lifestyle modification with diet and exercise.  Patient will need nutrition consult as well as pharmacological options as outpatient.    -Medications Xanax, atomoxetine, risperidone and sertraline while decreasing dose of gabapentin from 800 mg 3 times daily to 300 mg 3 times daily, also holding high-dose Percocet to Tylenol as needed for mild and oxycodone as needed for moderate pain.  Patient received morphine 4 mg IV x2 in ED.  Adding Lidoderm patch daily.    -Smoking cessation education provided, patient agreed NRT.     Nutrition  Regular    DVT/VTE Prophylaxis  SCD/Heparin     Anticipated medical stability for discharge: 1-2 days.    Service status/Reason for ongoing hospitalization: Inpatient/Acute resp  distress.   The appropriate admission status for this patient isinpatient.I certify that at the point of admission it is my clinical judgment that the patient will requireinpatienthospital care spanning beyond 2 midnights from the point of admission due to high intensity of service, high risk for further deterioration and high frequency of surveillance required.  Anticipated Discharge Needs: To be determined.      Medications:   Current Facility-Administered Medications   Medication Dose Route Frequency   . albuterol-ipratropium  3 mL Nebulization Q6H [place]   . atomoxetine  80 mg Oral Daily   . cefTAZidime  1 g Intravenous Q8H   . cetirizine  10 mg Oral Daily   . heparin (porcine)  5,000 Units Subcutaneous Q8H [place]   . lidocaine  1 patch Transdermal Q24H   . [START ON 03/24/2021] methylPREDNISolone  40 mg Intravenous TID   . montelukast  10 mg Oral QHS   . nicotine  1 patch Transdermal Daily   . oxybutynin  10 mg Oral TID   . risperiDONE  3 mg Oral  BID   . sertraline  100 mg Oral BID     5/4- 2-3LNC in use.      Primary Payor: MEDICAID HMO / Plan: [employer] / Product Type: MANAGED MEDICAID /      UTILIZATION REVIEW CONTACT: Name:   Debbra Riding RN,BSN  Utilization Review  [address] [place]  [place], [place] [address]  [id number]  [place]: [phone]  Tax ID:  [facility]   ([place]) [facility]   ([place]) [facility]   [facility]) [facility]   [facility]) [facility]  [facility])   [place].  [place], [place] [address] [place], [place] [address] [address]  [place], [place] [address] [phone] [facility].  [place], [place] [address] [place]  [place], [place] [address]   Fax: [phone] Fax: [phone] Fax: [phone] Fax: [phone] Fax: [phone]   NPI: [id number] NPI: [id number] NPI: [id number] NPI: [id number] NPI: [id number]     Please use fax  number [phone] to provide authorization for hospital services or to request additional information.        NOTES TO REVIEWER:    This clinical review is based on/compiled from documentation provided by the treatment team within the patient's medical record.

## 2021-03-24 ENCOUNTER — Inpatient Hospital Stay: Payer: Medicaid Other

## 2021-03-24 ENCOUNTER — Inpatient Hospital Stay (HOSPITAL_COMMUNITY): Payer: Medicaid Other

## 2021-03-24 DIAGNOSIS — I509 Heart failure, unspecified: Secondary | ICD-10-CM

## 2021-03-24 LAB — ECHOCARDIOGRAM ADULT COMPLETE W CLR/ DOPP WAVEFORM
AV Area (Cont Eq VTI): 2.537
AV Area (Cont Eq VTI): 2.58
AV Mean Gradient: 5
AV Mean Gradient: 6
AV Mean Gradient: 7
AV Peak Velocity: 164
AV Peak Velocity: 171
AV Peak Velocity: 174
AV Peak Velocity: 175
AV Peak Velocity: 184
Ao Root Diameter (2D): 3.6
Ao Root Diameter (2D): 3.6
IVS Diastolic Thickness (2D): 0.971
LA Dimension (2D): 4.1
LA Volume Index (BP A-L): 0.028
LVID diastole (2D): 5.87
LVID systole (2D): 3.75
MV Area (PHT): 5.126
MV E/A: 1.165
MV E/A: 1.3
MV E/e' (Average): 7.225
Prox Ascending Aorta Diameter: 3.5
RV Basal Diastolic Dimension: 4.72
RV Function: NORMAL
RV Systolic Pressure: 40
RV Systolic Pressure: 45.21
TAPSE: 2.85
Tricuspid Valve Findings: NORMAL

## 2021-03-24 LAB — BASIC METABOLIC PANEL
Anion Gap: 9 (ref 5.0–15.0)
BUN: 9 mg/dL (ref 7.0–19.0)
CO2: 23 mEq/L (ref 22–29)
Calcium: 10.1 mg/dL (ref 8.5–10.5)
Chloride: 110 mEq/L (ref 100–111)
Creatinine: 0.6 mg/dL (ref 0.6–1.0)
Glucose: 139 mg/dL — ABNORMAL HIGH (ref 70–100)
Potassium: 5.2 mEq/L — ABNORMAL HIGH (ref 3.5–5.1)
Sodium: 142 mEq/L (ref 136–145)

## 2021-03-24 LAB — CBC
Absolute NRBC: 0 10*3/uL (ref 0.00–0.00)
Hematocrit: 37.8 % (ref 34.7–43.7)
Hgb: 12.1 g/dL (ref 11.4–14.8)
MCH: 29.6 pg (ref 25.1–33.5)
MCHC: 32 g/dL (ref 31.5–35.8)
MCV: 92.4 fL (ref 78.0–96.0)
MPV: 10.3 fL (ref 8.9–12.5)
Nucleated RBC: 0 /100 WBC (ref 0.0–0.0)
Platelets: 277 10*3/uL (ref 142–346)
RBC: 4.09 10*6/uL (ref 3.90–5.10)
RDW: 14 % (ref 11–15)
WBC: 11.07 10*3/uL — ABNORMAL HIGH (ref 3.10–9.50)

## 2021-03-24 LAB — BLOOD GAS, ARTERIAL
Arterial Total CO2: 50.5 mEq/L — ABNORMAL HIGH (ref 24.0–30.0)
Base Excess, Arterial: 0.2 mEq/L (ref <=2.0)
FIO2: 21 %
HCO3, Arterial: 25 mEq/L (ref 23.0–29.0)
O2 Sat, Arterial: 96.7 % (ref 95.0–100.0)
Temperature: 37
pCO2, Arterial: 43.5 mmHg (ref 35.0–45.0)
pH, Arterial: 7.377 (ref 7.350–7.450)
pO2, Arterial: 87.3 mmHg (ref 80.0–90.0)

## 2021-03-24 LAB — GFR: EGFR: >60

## 2021-03-24 LAB — B-TYPE NATRIURETIC PEPTIDE: B-Natriuretic Peptide: 96 pg/mL (ref 0–100)

## 2021-03-24 MED ORDER — SODIUM CHLORIDE 0.9 % IV MBP
1.0000 g | INTRAVENOUS | Status: DC
Start: 2021-03-24 — End: 2021-03-31
  Administered 2021-03-24 – 2021-03-31 (×8): 1 g via INTRAVENOUS
  Filled 2021-03-24 (×8): qty 1000

## 2021-03-24 MED ORDER — OXYCODONE HCL 5 MG PO TABS
5.0000 mg | ORAL_TABLET | Freq: Four times a day (QID) | ORAL | Status: DC | PRN
  Administered 2021-03-24 – 2021-03-25 (×4): 5 mg via ORAL
  Filled 2021-03-24 (×4): qty 1

## 2021-03-24 MED ORDER — AZITHROMYCIN 500 MG IN 250 ML NS IVPB (CNR)
500.0000 mg | Freq: Every day | INTRAVENOUS | Status: AC
Start: 2021-03-24 — End: 2021-03-28
  Administered 2021-03-24 – 2021-03-28 (×5): 500 mg via INTRAVENOUS
  Filled 2021-03-24 (×3): qty 250
  Filled 2021-03-24: qty 500
  Filled 2021-03-24: qty 250

## 2021-03-24 MED ORDER — SODIUM POLYSTYRENE SULFONATE 15 GM/60ML PO SUSP
15.0000 g | Freq: Once | ORAL | Status: AC
Start: 2021-03-24 — End: 2021-03-24
  Administered 2021-03-24: 15 g via ORAL
  Filled 2021-03-24: qty 60

## 2021-03-24 MED ORDER — ALBUTEROL-IPRATROPIUM 2.5-0.5 (3) MG/3ML IN SOLN
3.0000 mL | Freq: Four times a day (QID) | RESPIRATORY_TRACT | Status: AC
Start: 2021-03-24 — End: 2021-03-24
  Administered 2021-03-24 (×4): 3 mL via RESPIRATORY_TRACT
  Filled 2021-03-24 (×3): qty 3

## 2021-03-24 MED ORDER — GABAPENTIN 300 MG PO CAPS
300.0000 mg | ORAL_CAPSULE | Freq: Three times a day (TID) | ORAL | Status: DC | PRN
Start: 2021-03-24 — End: 2021-03-27

## 2021-03-24 NOTE — Respiratory Progress Note (Signed)
Respiratory Therapy                              Patient Re-Assessment Note/Protocol Order Changes    Patient has been assessed and re-evaluated for the follow therapies:       Respiratory Orders   (From admission, onward)             Start     Ordered    03/23/21 0800  Nebulizer treatment intermittent  Every 6 hours scheduled (RT)      Comments:   All Adult patients ordered for Respiratory Therapy, i.e., inhaled meds, secretion clearance/lung expansion or Oxygen greater than 5 liters/min will be evaluated by a Respiratory Therapist and assessed per Respiratory Therapy Patient Driven Protocol.  Initial assessment and changes made per protocol can be found in the progress note section of the patient chart.    03/23/21 0627    03/23/21 0800  Resp Re-Assess Adult (RT Use Only)  2 times daily (RT)       03/23/21 2992              IP Meds - Nasal and Inhaled (From admission, onward)            Start     Stop Status Route Frequency Ordered    03/24/21 0245  albuterol-ipratropium (DUO-NEB) 2.5-0.5(3) mg/3 mL nebulizer 3 mL         05/06 0244 Dispensed NEBULIZATION RT - Every 6 hours scheduled 03/24/21 0238    03/23/21 0039  albuterol-ipratropium (DUO-NEB) 2.5-0.5(3) mg/3 mL nebulizer 3 mL         05/05 0159 Dispensed NEBULIZATION RT - Every 6 hours scheduled 03/23/21 0038               Current Criteria For Therapy  Secretion Clearance: Hx of mucus producing Dz  Lung Expansion: Home regimen  Medications: Hx of COPD, Asthma, Bronchitis or other documented RAD    Expected Outcomes  Secretion: Improved breath sound   Lung Expansion: Improved air movement at the bases   Meds: Decreased bronchospasms, Decreased freq of symptoms    Outcomes Met        Meds: No       Reassessment Recommendations  Recommendations: Continue current treatment plan    Patient's orders have been modified as follows per RT Patient Driven Protocol    Patient with insp/exp wheeze, inc RR and WOB,  cont current therapy    If any questions, please contact the Respiratory Therapist assigned to this patient    Thank you

## 2021-03-24 NOTE — Plan of Care (Signed)
Alert and oriented x 4 and she is able to communicate her needs. Requested for pain medication and oxycodone was given. Benadryl was given prior to CTA chest for allergies of the contrast and patient tolerated well. Denies SOB, CP, N/V and dizziness. All safety precautions are put in place with purposefully hourly rounding done.     Problem: Moderate/High Fall Risk Score >5  Goal: Patient will remain free of falls  Flowsheets (Taken 03/23/2021 2000)  High (Greater than 13):   HIGH-Consider use of low bed   HIGH-Initiate use of floor mats as appropriate   HIGH-Apply yellow "Fall Risk" arm band   MOD-Request PT/OT consult order for patients with gait/mobility impairment   MOD-Perform dangle, stand, walk (DSW) prior to mobilization   MOD-Re-orient confused patients   MOD-Remain with patient during toileting   MOD-Use of assistive devices -Bedside Commode if appropriate   LOW-Anticoagulation education for injury risk   MOD-Use of chair-pad alarm when appropriate     Problem: Safety  Goal: Patient will be free from injury during hospitalization  Flowsheets (Taken 03/24/2021 0349)  Patient will be free from injury during hospitalization:   Assess patient's risk for falls and implement fall prevention plan of care per policy   Provide and maintain safe environment   Ensure appropriate safety devices are available at the bedside   Use appropriate transfer methods   Include patient/ family/ care giver in decisions related to safety   Hourly rounding   Assess for patients risk for elopement and implement [place] per policy   Provide alternative method of communication if needed (communication boards, writing)  Goal: Patient will be free from infection during hospitalization  Flowsheets (Taken 03/24/2021 0349)  Free from Infection during hospitalization:   Assess and monitor for signs and symptoms of infection   Monitor lab/diagnostic results   Encourage patient and family to use good hand hygiene  technique     Problem: Pain  Goal: Pain at adequate level as identified by patient  Flowsheets (Taken 03/24/2021 0349)  Pain at adequate level as identified by patient:   Identify patient comfort function goal   Assess for risk of opioid induced respiratory depression, including snoring/sleep apnea. Alert healthcare team of risk factors identified.   Assess pain on admission, during daily assessment and/or before any "as needed" intervention(s)   Reassess pain within 30-60 minutes of any procedure/intervention, per Pain Assessment, Intervention, Reassessment (AIR) Cycle   Evaluate if patient comfort function goal is met   Evaluate patient's satisfaction with pain management progress   Offer non-pharmacological pain management interventions   Consult/collaborate with Pain Service   Consult/collaborate with Physical Therapy, Occupational Therapy, and/or Speech Therapy     Problem: Side Effects from Pain Analgesia  Goal: Patient will experience minimal side effects of analgesic therapy  Flowsheets (Taken 03/24/2021 0349)  Patient will experience minimal side effects of analgesic therapy:   Monitor/assess patient's respiratory status (RR depth, effort, breath sounds)   Assess for changes in cognitive function   Prevent/manage side effects per LIP orders (i.e. nausea, vomiting, pruritus, constipation, urinary retention, etc.)   Evaluate for opioid-induced sedation with appropriate assessment tool (i.e. POSS)     Problem: Discharge Barriers  Goal: Patient will be discharged home or other facility with appropriate resources  Flowsheets (Taken 03/24/2021 0349)  Discharge to home or other facility with appropriate resources:   Provide appropriate patient education   Provide information on available health resources     Problem: Psychosocial and Spiritual  Needs  Goal: Demonstrates ability to cope with hospitalization/illness  Flowsheets (Taken 03/24/2021 0349)  Demonstrates ability to cope with  hospitalizations/illness:   Encourage verbalization of feelings/concerns/expectations   Provide quiet environment   Assist patient to identify own strengths and abilities   Encourage patient to set small goals for self   Encourage participation in diversional activity   Reinforce positive adaptation of new coping behaviors   Include patient/ patient care companion in decisions   Communicate referral to spiritual care as appropriate     Problem: Compromised Hemodynamic Status  Goal: Vital signs and fluid balance maintained/improved  Flowsheets (Taken 03/24/2021 0349)  Vital signs and fluid balance are maintained/improved:   Position patient for maximum circulation/cardiac output   Monitor/assess vitals and hemodynamic parameters with position changes   Monitor and compare daily weight   Monitor intake and output. Notify LIP if urine output is less than 30 mL/hour.   Monitor/assess lab values and report abnormal values     Problem: Inadequate Gas Exchange  Goal: Adequate oxygenation and improved ventilation  Flowsheets (Taken 03/24/2021 0349)  Adequate oxygenation and improved ventilation:   Assess lung sounds   Monitor SpO2 and treat as needed   Position for maximum ventilatory efficiency   Provide mechanical and oxygen support to facilitate gas exchange   Teach/reinforce use of incentive spirometer 10 times per hour while awake, cough and deep breath as needed   Plan activities to conserve energy: plan rest periods   Increase activity as tolerated/progressive mobility   Consult/collaborate with Respiratory Therapy

## 2021-03-24 NOTE — Progress Notes (Signed)
PROGRESS NOTE    Date Time: 03/24/21 2:02 PM  Patient Name: [name], [name]      Subjective:   Doing better, lessshortness of breath  3 L O2 96%  Chest x-ray with some progression bilateral infiltrate  BNP/procalcitonin are normal C-reactive protein is elevated    Medications:      Scheduled Meds: PRN Meds:    albuterol-ipratropium, 3 mL, Nebulization, Q6H [facility]  atomoxetine, 80 mg, Oral, Daily  azithromycin, 500 mg, Intravenous, Daily  cefTRIAXone, 1 g, Intravenous, Q24H  cetirizine, 10 mg, Oral, Daily  famotidine, 20 mg, Oral, BID  heparin (porcine), 5,000 Units, Subcutaneous, Q8H [facility]  lidocaine, 1 patch, Transdermal, Q24H  methylPREDNISolone, 80 mg, Intravenous, TID  montelukast, 10 mg, Oral, QHS  nicotine, 1 patch, Transdermal, Daily  oxybutynin, 10 mg, Oral, TID  risperiDONE, 3 mg, Oral, BID  sertraline, 100 mg, Oral, BID          Continuous Infusions:   acetaminophen, 650 mg, Q6H PRN   Or  acetaminophen, 650 mg, Q6H PRN  ALPRAZolam, 0.5 mg, BID PRN  glucagon (rDNA), 1 mg, PRN   And  dextrose, 250 mL, PRN   And  dextrose, 25 g, PRN   And  dextrose, 25 g, PRN  gabapentin, 300 mg, TID PRN  guaiFENesin, 200 mg, Q4H PRN  melatonin, 3 mg, QHS PRN  naloxone, 0.2 mg, PRN  ondansetron, 4 mg, Q6H PRN   Or  ondansetron, 4 mg, Q6H PRN  oxyCODONE, 5 mg, Q6H PRN            Review of Systems:   A comprehensive review of systems was: General ROS:_0    negative other than :  Respiratory ROS:_1  cough,_2 shortness of breath,  _3  wheezing  Cardiovascular ROS:  _4  chest pain _5  dyspnea on exertion  Gastrointestinal ROS: _6  abdominal pain, _7 change in bowel habits,  _8 black/ bloody stools  Genito-Urinary ROS: _9  dysuria,_10  trouble voiding,_11  hematuria  Musculoskeletal ROS:_12  negative  Neurological ROS:_13  negative    Physical Exam:     Vitals:    03/24/21 0735   BP:    Pulse: 78   Resp: (!) 26   Temp:    SpO2: 93%       Intake and Output Summary (Last 24 hours) at Date Time    Intake/Output Summary (Last 24 hours) at 03/24/2021  1402  Last data filed at 03/24/2021 0537  Gross per 24 hour   Intake 900 ml   Output 2200 ml   Net -1300 ml       General appearance _14  alert, _15 well appearing,  _16 no distress  Eyes -_17  pupils equal and_18 reactive, -_19   extraocular eye movements intact  Mouth -_20  mucous membranes moist, _21 pharynx normal without lesions  Neck -_22   supple,  _23 no adenopathy, _24 no JVD,_25 Thyromegaly.  Lymphatics -_26  no palpable lymphadenopathy  Chest - _27 clear to auscultation, _28  wheezes, _29 rales_30  rhonchi, _31 symmetric air entry  Heart - _32 normal rate, _33 regular rhythm, _34 normal S1, S2, _35  murmurs,_36  rubs, _37   gallops  Abdomen - _38 soft,_39  nontender,_40  nondistended,_41  no masses_42  organomegaly  Neurological - _43 alert,_44  oriented x3,_45  normal speech,_46  no focal findings or movement disorder noted  Musculoskeletal - _47  joint tenderness,_48  deformity _49 swelling  Extremities -_50  peripheral pulses normal,_51   pedal edema,_52  clubbing _53  cyanosis  Skin - _54 normal coloration and turgor, _55  rashes,_56   skin lesions noted                Labs:     Results     Procedure Component Value Units Date/Time  Blood gas, arterial [[id number]]  (Abnormal) Collected: 03/24/21 1314    Specimen: Blood, Arterial Updated: 03/24/21 1337     pH, Arterial 7.377     pCO2, Arterial 43.5 mmHg      pO2, Arterial 87.3 mmHg      HCO3, Arterial 25.0 mEq/L      Arterial Total CO2 50.5 mEq/L      Base Excess, Arterial 0.2 mEq/L      O2 Sat, Arterial 96.7 %      ABG CollectionSite Right Radl     Allen's Test Yes     Temperature 37.0     FIO2 21 %      Status Room Air     O2 Delivery Room Air    Respiratory Pathogen Panel w/COVID-19, PCR [[id number]] Collected: 03/24/21 1307    Specimen: Nasopharyngeal Updated: 03/24/21 1309    Narrative:      For NP, Call laboratory for VTM NP Swab collection device.  Bronchoalveolar Lavage is submitted in a sterile container.  Diagnostic -[facility]  Viral Transport Media    ANA Screen Only [[id number]] Collected: 03/24/21 0545    Specimen: Blood Updated:  03/24/21 2178    Basic Metabolic Panel [[id number]]  (Abnormal) Collected: 03/24/21 0805    Specimen: Blood Updated: 03/24/21 0842     Glucose 139 mg/dL      BUN 9.0 mg/dL      Creatinine 0.6 mg/dL      Calcium 10.1 mg/dL      Sodium 142 mEq/L      Potassium 5.2 mEq/L      Chloride 110 mEq/L      CO2 23 mEq/L      Anion Gap 9.0    GFR [[id number]] Collected: 03/24/21 0805     Updated: 03/24/21 0842     EGFR >60.0    CBC without differential [[id number]]  (Abnormal) Collected: 03/24/21 0805    Specimen: Blood Updated: 03/24/21 0838     WBC 11.07 x10 3/uL      Hgb 12.1 g/dL      Hematocrit 37.8 %      Platelets 277 x10 3/uL      RBC 4.09 x10 6/uL      MCV 92.4 fL      MCH 29.6 pg      MCHC 32.0 g/dL      RDW 14 %      MPV 10.3 fL      Nucleated RBC 0.0 /100 WBC      Absolute NRBC 0.00 x10 3/uL     B-type Natriuretic Peptide [[id number]] Collected: 03/24/21 0545    Specimen: Blood Updated: 03/24/21 0627     B-Natriuretic Peptide 96 pg/mL     Culture Blood Aerobic and Anaerobic [[id number]] Collected: 03/22/21 2349    Specimen: Arm from Blood, Venipuncture Updated: 03/24/21 0621    Narrative:      ORDER#: [id number]                                    ORDERED BY: HE, ALBERT  SOURCE: Blood, Venipuncture Arm                      COLLECTED:  03/22/21 23:49  ANTIBIOTICS AT COLL.:  RECEIVED :  03/23/21 05:29  Culture Blood Aerobic and Anaerobic        PRELIM      03/24/21 06:21  03/24/21   No Growth after 1 day/s of incubation.      Culture Blood Aerobic and Anaerobic [[id number]] Collected: 03/22/21 2349    Specimen: Arm from Blood, Venipuncture Updated: 03/24/21 0621    Narrative:      ORDER#: [id number]                                    ORDERED BY: HE, ALBERT  SOURCE: Blood, Venipuncture Arm                      COLLECTED:  03/22/21 23:49  ANTIBIOTICS AT COLL.:                                RECEIVED :  03/23/21 05:29  Culture Blood Aerobic and Anaerobic        PRELIM      03/24/21 06:21  03/24/21    No Growth after 1 day/s of incubation.      CULTURE + Lacretia Leigh [[id number]] Collected: 03/23/21 2208    Specimen: Sputum, Expectorated Updated: 03/24/21 0620    Narrative:      Results Faxed [phone], by [id number] on 03/24/2021 at 06:20  Culture and Gram Stain, Aerobic, Respiratory was cancelled on 03/24/21 at  06:20 by [phone]; Specimen quality is inadequate for culture  Specimen appears  to be saliva  Please submit another sputum specimen  Induction of sputum may  improve specimen quality  ORDER#: [id number]                                    ORDERED BY: JABBAR, LUBNA  SOURCE: Sputum, Expectorated as below                COLLECTED:  03/23/21 22:08  ANTIBIOTICS AT COLL.:                                RECEIVED :  03/24/21 01:47  Results Faxed [phone], by [id number] on 03/24/2021 at 06:20  Culture and Gram Stain, Aerobic, Respiratory was cancelled on 03/24/21 at 06:20 by [id number]; Specimen quality is inadequate for  culture  Specimen appears to be saliva  Please submit another sputum specimen  Induction of sputum may improve specimen quality  Stain, Gram (Respiratory)                  FINAL       03/24/21 06:20  03/24/21   Smear contains > or = 10 squamous epithelial cells per low             power field, suggestive of significant oral contamination and             poor quality specimen. Culture not performed.             Please recollect if clinically indicated.  Culture and Gram Stain, Aerobic, RespiratorCANCELLED   03/24/21 06:20            - cancelled on 03/24/21 06:20   by  [id number]   Specimen quality is inadequate for culture  Specimen  appears to be saliva   Please submit another sputum specimen  Induction of sputum may improve specimen   quality      Urinalysis Reflex to Microscopic Exam- Reflex to Culture [[id number]]  (Abnormal) Collected: 03/23/21 1933     Updated: 03/23/21 1947     Urine Type Urine, Clean Ca     Color, UA Yellow     Clarity, UA Hazy     Specific Gravity UA 1.009      Urine pH 7.0     Leukocyte Esterase, UA Trace     Nitrite, UA Negative     Protein, UR Negative     Glucose, UA Negative     Ketones UA Negative     Urobilinogen, UA Negative mg/dL      Bilirubin, UA Negative     Blood, UA Small     RBC, UA 0 - 2 /hpf      WBC, UA 0 - 5 /hpf      Squamous Epithelial Cells, Urine 0 - 5 /hpf     Procalcitonin [[id number]] Collected: 03/23/21 1704     Updated: 03/23/21 1942     Procalcitonin 0.02    C Reactive Protein [[id number]]  (Abnormal) Collected: 03/23/21 1704    Specimen: Blood Updated: 03/23/21 1915     C-Reactive Protein 6.2 mg/dL     Rheumatoid factor [[id number]] Collected: 03/23/21 1704    Specimen: Blood Updated: 03/23/21 1912     Rheumatoid Factor <13.0    Troponin I [[id number]] Collected: 03/23/21 1704    Specimen: Blood Updated: 03/23/21 1803     Troponin I <0.01 ng/mL     Sedimentation rate (ESR) [[id number]]  (Abnormal) Collected: 03/23/21 1704    Specimen: Blood Updated: 03/23/21 1741     Sed Rate 26 mm/Hr     D-Dimer [[id number]]  (Abnormal) Collected: 03/23/21 1704     Updated: 03/23/21 1729     D-Dimer 0.81 ug/mL FEU           Rads:     Radiology Results (24 Hour)     Procedure Component Value Units Date/Time    XR Chest AP Portable [[id number]] Collected: 03/24/21 1019    Order Status: Completed Updated: 03/24/21 1022    Narrative:      History: pna    Technique: Single Portable View    Comparison: 05/03/    Findings:  There is worsened extensive alveolar consolidation in the lungs  bilaterally, most likely representing pulmonary edema. This is possibly  on the basis of severe congestive heart failure but could be a result of  noncardiogenic causes.   There is no pneumothorax.  There is moderate cardiomegaly.     The aorta appears tortuous, a  finding usually associated with either atherosclerosis or systemic  hypertension.           Impression:       Progressive diffuse lung disease    Elyn Peers, MD   03/24/2021 10:20 AM    CT Angiogram Chest [[id number]] Collected:  03/23/21 2239    Order Status: Completed Updated: 03/23/21 2250    Narrative:      INDICATION: Chest pain. Possible pulmonary embolism.    TECHNIQUE: A spiral acquisition of the chest was obtained following the  injection of 85 cc contrast. 3-D post processing was subsequently  performed to obtain MIP images. A combination of automatic exposure  control, adjustment of the mA and or KV according to patient size,  and/or use of iterative reconstruction  technique was utilized.    FINDINGS: There is suboptimal opacification of the pulmonary arteries  particularly in the upper lobes. No definite pulmonary embolism is  noted. The pulmonary trunk is dilated measuring 37 mm in width.    There are groundglass infiltrates involving all lobes of both lungs.  Similar findings were present on the CT done on 03/22/2021. There are  regions of opacities seen in both upper lobes suggestive of underlying  centrilobular emphysema. There is mild cylindrical bronchiectasis in the  right middle lobe.    No endobronchial lesion is noted.      There is no evidence of significant lymphadenopathy.      The costophrenic angles are free of effusion.      The adrenal glands are not enlarged.      Impression:       There is suboptimal opacification of the pulmonary arteries  particularly in the upper lobes. No definite pulmonary embolism is  noted. Bilateral groundglass infiltrates    Steward Drone, MD   03/23/2021 10:48 PM            Assessment/Plan:     Interstitial pneumonitis: Treated with IV steroid, awaiting work-up low BNP negative procalcitonin elevated C-reactive protein, negative COVID negative rheumatoid factor awaiting ANA.  Check HIV, clinically feeling better,.continue with steroid.  Monitor clinically and radiographically for improvement/progression.    Hypoxic respiratory sufficiency: 3 L O2 adequate review saturation titrate as tolerated    Signed by: Myrene Buddy, MD, MD

## 2021-03-24 NOTE — Progress Notes (Signed)
[facility]  PROGRESS NOTE      Patient: [name]  Date: 03/24/2021   LOS: 2 Days  Admission Date: 03/22/2021   MRN: [id number]  Attending: Dr. Romana Juniper Blair Hailey, PA-C  Please contact me on secure chat or the following Spectralink [phone]       ASSESSMENT/PLAN     [name] is a 50 y.o. female with a PMHx of tobacco dependence, chronic respiratory failure secondary to COPD (O2 dependent on 2 L via NC), history of psychosis, anxiety/depression, OSA (not using CPAP) morbid obesity, and chronic back pain admitted with acute COPD exacerbation.    This morning patient somnolent, wakes briefly to touch. Has not received sedating medications. VSS, patient is protecting airway. STAT ABG ordered. CXR with worsened extensive alveolar consolidation in the lungs bilaterally. Echocardiogram ordered. Discussed with nursing and Dr. Romana Juniper. Pulmonology following, possible bronchoscopy if no improvement.    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  Enlarged main pulmonary trunk suggestive of underlying pulmonary arterial hypertension. CTA chest without definite evidence of PE, again notes bilateral ground glass infiltrates.  -Solumedrol 80 mg every 8 hours  -Titrate oxygen to maintain O2 saturations 88 to 94%  -Continue ceftazidime  -Continue DuoNebs every 6 hours  -Antitussives as needed  -BNP and serial troponins wnl, Echocardiogram pending  -ESR and CRP elevated, ANA pending. Rh negative.  -Sputum culture contaminated, RVP pending collection  -Appreciate pulmonology evaluation, likely nonspecific interstitial pneumonitis. Possible bronchoscopy if no  improvement.  -Continue educating on the importance of smoking cessation    Altered mental status  Patient somnolent this morning, awakens briefly to touch. Vitals stable, protecting airway. Has not received any prns this morning (No oxycodone, xanax, or gabapentin)  - STAT ABG ordered    Chronic back pain  -Hold gabapentin and oxycodone until mental status improves    History of OSA  -Outpatient sleep study     Morbid obesity  -Lifestyle modification with diet and exercise on discharge    Anxiety/depression  -Continue home Xanax, atomoxetine, risperidone, and sertraline    Tobacco dependence  -Cessation advised, continue nicotine patch-    GERD  -Pepcid added     Dysuria  -Trace LE, continue abx as above      Analgesia: Tylenol, oxycodone, gabapentin    Nutrition: regular    DVT Prophylaxis:Heparin       Code Status: Full    DISPO: Pending improvement in respiratory symptoms. Likely 2-3 days.       SUBJECTIVE     [name] somnolent this morning, unable to complete ROS.    MEDICATIONS     Current Facility-Administered Medications   Medication Dose Route Frequency    albuterol-ipratropium  3 mL Nebulization Q6H [place]    atomoxetine  80 mg Oral Daily    azithromycin  500 mg Intravenous Daily    cefTRIAXone  1 g Intravenous Q24H    cetirizine  10 mg Oral Daily    famotidine  20 mg Oral BID    heparin (porcine)  5,000 Units Subcutaneous Q8H [place]    lidocaine  1 patch Transdermal Q24H    methylPREDNISolone  80 mg Intravenous TID    montelukast  10 mg Oral QHS    nicotine  1 patch Transdermal Daily    oxybutynin  10 mg Oral TID    risperiDONE  3 mg Oral BID    sertraline  100 mg Oral BID    sodium polystyrene  15 g Oral Once       PHYSICAL EXAM     Vitals:    03/24/21 0735   BP:    Pulse: 78   Resp: (!) 26   Temp:    SpO2: 93%       Temperature: Temp  Min: 97.5 F (36.4 C)  Max: 98.6 F (37 C)  Pulse: Pulse  Min: 77  Max: 96  Respiratory: Resp  Min: 20  Max: 26  Non-Invasive BP: BP  Min: 100/67   Max: 127/88  Pulse Oximetry SpO2  Min: 92 %  Max: 97 %    Intake and Output Summary (Last 24 hours) at Date Time    Intake/Output Summary (Last 24 hours) at 03/24/2021 1035  Last data filed at 03/24/2021 0537  Gross per 24 hour   Intake 1300 ml   Output 2200 ml   Net -900 ml       GEN APPEARANCE: Somnolent, wakes briefly to touch  HEENT: PERLA; EOMI; Conjunctiva Clear  NECK: Supple; No bruits  CVS: RRR, S1, S2; No M/G/R  LUNGS: Coarse breath sounds with expiratory wheezing bilaterally  ABD: Obese abdomen, Soft; No TTP; + Normoactive BS  EXT: No edema; Pulses 2+ and intact  Skin exam:  no pallor  NEURO: CN 2-12 intact; No Focal neurological deficits  CAP REFILL:  Normal  MENTAL STATUS:  Altered    Exam done by Madeline Dada, PA on 03/24/21 at 11:05 AM      LABS     Recent Labs   Lab 03/24/21  0805 03/23/21  0629 03/22/21  1949   WBC 11.07* 10.44* 10.78*   RBC 4.09 4.08 4.16   Hgb 12.1 12.0 12.1   Hematocrit 37.8 37.2 37.0   MCV 92.4 91.2 88.9   Platelets 277 295 283       Recent Labs   Lab 03/24/21  0805 03/23/21  0306 03/22/21  1949   Sodium 142 138 137   Potassium 5.2* 5.0 4.5   Chloride 110 105 105   CO2 23 20* 23   BUN 9.0 8.0 9.0   Creatinine 0.6 0.6 0.6   Glucose 139* 161* 114*   Calcium 10.1 9.1 9.2       Recent Labs   Lab 03/22/21  1949   ALT 10   AST (SGOT) 13   Bilirubin, Total 0.5   Albumin 2.9*   Alkaline Phosphatase 96       Recent Labs   Lab 03/23/21  1704 03/23/21  0306 03/22/21  1949   Troponin I <0.01 <0.01 <0.01       Recent Labs   Lab 03/23/21  0306   PT INR 0.9   PT 10.2       Microbiology Results (last 15 days)     Procedure Component Value Units Date/Time    CULTURE + [name] [[id number]] Collected: 03/23/21 2208    Order Status: Completed Specimen: Sputum, Expectorated Updated: 03/24/21 0620    Narrative:      Results Faxed [phone], by [id number] on 03/24/2021 at 06:20  Culture and Gram Stain, Aerobic, Respiratory was cancelled on 03/24/21 at  06:20 by [id number]; Specimen  quality is inadequate for culture  Specimen  appears  to be saliva  Please submit another sputum specimen  Induction of sputum may  improve specimen quality  ORDER#: [id number]                                    ORDERED BY: JABBAR, LUBNA  SOURCE: Sputum, Expectorated as below                COLLECTED:  03/23/21 22:08  ANTIBIOTICS AT COLL.:                                RECEIVED :  03/24/21 01:47  Results Faxed [phone], by [id number] on 03/24/2021 at 06:20  Culture and Gram Stain, Aerobic, Respiratory was cancelled on 03/24/21 at 06:20 by [id number]; Specimen quality is inadequate for  culture  Specimen appears to be saliva  Please submit another sputum specimen  Induction of sputum may improve specimen quality  Stain, Gram (Respiratory)                  FINAL       03/24/21 06:20  03/24/21   Smear contains > or = 10 squamous epithelial cells per low             power field, suggestive of significant oral contamination and             poor quality specimen. Culture not performed.             Please recollect if clinically indicated.  Culture and Gram Stain, Aerobic, RespiratorCANCELLED   03/24/21 06:20            - cancelled on 03/24/21 06:20   by  [id number]   Specimen quality is inadequate for culture  Specimen appears to be saliva   Please submit another sputum specimen  Induction of sputum may improve specimen   quality      Respiratory Pathogen Panel w/COVID-19, PCR [[id number]]     Order Status: Sent Specimen: Nasopharyngeal     Culture Blood Aerobic and Anaerobic [[id number]] Collected: 03/22/21 2349    Order Status: Completed Specimen: Arm from Blood, Venipuncture Updated: 03/24/21 0621    Narrative:      ORDER#: [id number]                                    ORDERED BY: HE, ALBERT  SOURCE: Blood, Venipuncture Arm                      COLLECTED:  03/22/21 23:49  ANTIBIOTICS AT COLL.:                                RECEIVED :  03/23/21 05:29  Culture Blood Aerobic and Anaerobic        PRELIM      03/24/21 06:21  03/24/21    No Growth after 1 day/s of incubation.      Culture Blood Aerobic and Anaerobic [[id number]] Collected: 03/22/21 2349    Order Status: Completed Specimen: Arm from Blood, Venipuncture Updated: 03/24/21 0621    Narrative:      ORDER#: [id number]  ORDERED BY: HE, ALBERT  SOURCE: Blood, Venipuncture Arm                      COLLECTED:  03/22/21 23:49  ANTIBIOTICS AT COLL.:                                RECEIVED :  03/23/21 05:29  Culture Blood Aerobic and Anaerobic        PRELIM      03/24/21 06:21  03/24/21   No Growth after 1 day/s of incubation.      COVID-19 (SARS-CoV-2) and Influenza A/B, NAA ([facility] Rapid)- Admission [[id number]] Collected: 03/22/21 2227    Order Status: Completed Specimen: Culturette from Nasopharyngeal Updated: 03/22/21 2304     Purpose of COVID testing Diagnostic -[facility]     SARS-CoV-2 Specimen Source Nasal Swab     SARS CoV 2 Overall Result Not Detected     Comment: __________________________________________________  -A result of "Detected" indicates POSITIVE for the    presence of SARS CoV-2 RNA  -A result of "Not Detected" indicates NEGATIVE for the    presence of SARS CoV-2 RNA  __________________________________________________________  Test performed using the Roche cobas Liat SARS-CoV-2 assay. This assay is  only for use under the Food and Drug Administrations Emergency Use  Authorization. This is a real-time RT-PCR assay for the qualitative  detection of SARS-CoV-2 RNA. Viral nucleic acids may persist in vivo,  independent of viability. Detection of viral nucleic acid does not imply the  presence of infectious virus, or that virus nucleic acid is the cause of  clinical symptoms. Negative results do not preclude SARS-CoV-2 infection and  should not be used as the sole basis for diagnosis, treatment or other  patient management decisions. Negative results must be combined with  clinical observations, patient history, and/or epidemiological  information.  Invalid results may be due to inhibiting substances in the specimen and  recollection should occur. Please see Fact Sheets for patients and providers  located:  https://www.[employer].com/          Influenza A Not Detected     Influenza B Not Detected     Comment: Test performed using the Roche cobas Liat SARS-CoV-2 & Influenza A/B assay.  This assay is only for use under the Food and Drug Administrations  Emergency Use Authorization. This is a multiplex real-time RT-PCR assay  intended for the simultaneous in vitro qualitative detection and  differentiation of SARS-CoV-2, influenza A, and influenza B virus RNA. Viral  nucleic acids may persist in vivo, independent of viability. Detection of  viral nucleic acid does not imply the presence of infectious virus, or that  virus nucleic acid is the cause of clinical symptoms. Negative results do  not preclude SARS-CoV-2, influenza A, and/or influenza B infection and  should not be used as the sole basis for diagnosis, treatment or other  patient management decisions. Negative results must be combined with  clinical observations, patient history, and/or epidemiological information.  Invalid results may be due to inhibiting substances in the specimen and  recollection should occur. Please see Fact Sheets for patients and providers  located: http://[facility].info/.         Narrative:      o Collect and clearly label specimen type:  o PREFERRED-Upper respiratory specimen: One Nasal Swab in  Transport Media.  o Hand deliver to laboratory ASAP  Diagnostic -[facility]  RADIOLOGY     Upon my review:    Claris Gladden, PA  10:35 AM 03/24/2021

## 2021-03-24 NOTE — Respiratory Progress Note (Signed)
Respiratory Therapy                              Patient Re-Assessment Note/Protocol Order Changes    Patient has been assessed and re-evaluated for the follow therapies:       Respiratory Orders   (From admission, onward)             Start     Ordered    03/23/21 0800  Nebulizer treatment intermittent  Every 6 hours scheduled (RT)      Comments:   All Adult patients ordered for Respiratory Therapy, i.e., inhaled meds, secretion clearance/lung expansion or Oxygen greater than 5 liters/min will be evaluated by a Respiratory Therapist and assessed per Respiratory Therapy Patient Driven Protocol.  Initial assessment and changes made per protocol can be found in the progress note section of the patient chart.    03/23/21 0627    03/23/21 0800  Resp Re-Assess Adult (RT Use Only)  2 times daily (RT)       03/23/21 7366              IP Meds - Nasal and Inhaled (From admission, onward)            Start     Stop Status Route Frequency Ordered    03/24/21 0245  albuterol-ipratropium (DUO-NEB) 2.5-0.5(3) mg/3 mL nebulizer 3 mL         05/06 0244 Verified NEBULIZATION RT - Every 6 hours scheduled 03/24/21 0238    03/23/21 0039  albuterol-ipratropium (DUO-NEB) 2.5-0.5(3) mg/3 mL nebulizer 3 mL         05/05 0159 Dispensed NEBULIZATION RT - Every 6 hours scheduled 03/23/21 0038               Current Criteria For Therapy  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    <free  text>    If any questions, please contact the Respiratory Therapist assigned to this patient    Thank you

## 2021-03-24 NOTE — Respiratory Progress Note (Signed)
Respiratory Assessment/Recommendations:   How did you give education in room   Identify Barriers and Challenges (Social, Language, etc.) current smoker prior to admission. Patient awake but appears to doze easily.   Plan for further education (Fam/Patient)   Obtaining Medications for Use at Home?   Is It Difficult Getting Medications from Pharmacy? No   Is It Difficult for Patient to Pay for Their Medications? No   (Note: If the answer is yes to the above two questions, notify Case Manager so  that the issue can be addressed before the patient is discharged)   Does the patient have an air compressor for nebulized meds? No   Does the patient have equipment to change out components as recommended (Nasal Cannula, Filters)? Yes    Past medical history:I have reviewed and confirmed the past medical history in the chart.Tobacco dependence, chronic respiratory failure secondary to COPD. Home oxygen 2L. OSA not using cpap, unit broken per patient. Attempting to have insurance cover cost of replacement. Patient also indicates noncompliance, tolerated poorly. Morbid obesity.Patient does appear to understand the severity of her OSA.      Smoking History: The patient smokes 1 packs per day.Currently using the patch during admission, om 14 mg. Indicates plan to quit, last attempt 12/29/2018. Smoking cessation material in packet.     Smoking Cessation     [place] Smoking Cessation Program [phone]   American Lung Association: [phone]   Quit Now [place]: [phone]     "Quit[employer] Circle" (Mobile App)  use code "ICAN"    Respiratory Medication(s): Rescue Albuterol inhaler reviewed.    List out (from med req) and have conversation with patient    Technique review and teach back (Demonstration and explain how and why you  take this type of medication and frequency).   In-Check information here   Is the Patient Using Triple Therapy at Home?   Inhaled Corticosteroid- No   Long-Acting [place]  Antagonist- No   Long-Acting Beta 2 Agonist-No    Prescribed Oxygen Therapy:   Is patient on home oxygen Yes   Understand oxygen safety Yes   DME Yes    Patient Followed by a Pulmonologist? Yes, unable to recall physician name.    Patient was given verbal and written information on managing COPD outside of hospital including: Yes, packet left with patient. Discussed the importance of making an attempt to quit smoking and the impact it has on her COPD. Reviewed oxygen safety, patient using intermittently. Reviewed rescue inhaler. Patient and family not vaccinated against COVID.Reviwed infection prevention.Patient limited mobility due to obesity, patient appears dyspnea with conversation and minimal exertion.   COPD Action Plan and Zones   Smoking cessation and avoiding airway irritants   Get the flu and pneumonia shots as recommended by physician.   Keeping up regular exercise.   Purse-lip breathing   Eating right and maintaining a healthy weight.   Watching for early signs of lung infection and exacerbation:   Low grade fever that doesn't go away (100.5 oral or 99.5 under arm)   Increased use of rescue medications (albuterol, levalbuterol)   Change in color, thickness or amount of mucus (greenish or yellowish)   Tiredness that lasts for more than one day   New or increased ankle swelling   Taking medications as prescribed.   The benefits of pulmonary rehabilitation   Using oxygen as prescribed by physician   Visit physician regularly even if the patient is feeling well.   Importance of keeping all physician  appointments after discharge

## 2021-03-24 NOTE — Plan of Care (Signed)
Progress Note  Patient remained free from falls and injury throughout shift. Safety and fall precautions have been implemented per protocol. purposeful rounding done.   Alert & Oriented: x 4  Pain: 0-8/10 back pain; held oxycodone due change to increased sleepiness in the AM; attending aware; ordered ABG.  Vitals: VSS 2L NC 93%  Telemetry: NSR  Assessment: Pt c/o fatigue in the AM & back pain.  Pt denies HA, dizziness, blurred vision, CP, SOB, N/V, diarrhea/constipation.   VTE Prophylaxis: Heparin sub-q & SCD's  Diet: Regular  D/C Plan: No expected discharge    Problem: Moderate/High Fall Risk Score >5  Goal: Patient will remain free of falls  Outcome: Progressing  Flowsheets (Taken 03/24/2021 1613)  Moderate Risk (6-13):   MOD-Consider activation of bed alarm if appropriate   MOD-Apply bed exit alarm if patient is confused   MOD-Floor mat at bedside (where available) if appropriate   MOD-Consider a move closer to [place]   MOD-Remain with patient during toileting   MOD-Place bedside commode and assistive devices out of sight when not in use   MOD-Re-orient confused patients   MOD-Utilize diversion activities   MOD-Perform dangle, stand, walk (DSW) prior to mobilization   MOD-Request PT/OT consult order for patients with gait/mobility impairment   MOD-Use gait belt when appropriate   MOD-include family in multidisciplinary POC discussions  VH Moderate Risk (6-13):   ALL REQUIRED LOW INTERVENTIONS   INITIATE YELLOW "FALL RISK" SIGNAGE   YELLOW NON-SKID SLIPPERS   YELLOW "FALL RISK" ARM BAND   USE OF BED EXIT ALARM IF PATIENT IS CONFUSED OR IMPULSIVE. PLACE RESET BED ALARM SIGN ABOVE BED   PLACE FALL RISK LEVEL ON WHITE BOARD FOR COMMUNICATION PURPOSES IN PATIENT'S ROOM     Problem: Safety  Goal: Patient will be free from injury during hospitalization  Outcome: Progressing  Flowsheets (Taken 03/24/2021 1613)  Patient will be free from injury during hospitalization:   Assess patient's risk for  falls and implement fall prevention plan of care per policy   Provide and maintain safe environment   Use appropriate transfer methods   Ensure appropriate safety devices are available at the bedside   Include patient/ family/ care giver in decisions related to safety   [place] rounding   Assess for patients risk for elopement and implement [place] per policy   Provide alternative method of communication if needed (communication boards, writing)  Goal: Patient will be free from infection during hospitalization  Outcome: Progressing  Flowsheets (Taken 03/24/2021 1613)  Free from Infection during hospitalization:   Assess and monitor for signs and symptoms of infection   Monitor lab/diagnostic results   Monitor all insertion sites (i.e. indwelling lines, tubes, urinary catheters, and drains)   Encourage patient and family to use good hand hygiene technique     Problem: Pain  Goal: Pain at adequate level as identified by patient  Outcome: Progressing  Flowsheets (Taken 03/24/2021 1613)  Pain at adequate level as identified by patient:   Identify patient comfort function goal   Assess for risk of opioid induced respiratory depression, including snoring/sleep apnea. Alert healthcare team of risk factors identified.   Assess pain on admission, during daily assessment and/or before any "as needed" intervention(s)   Reassess pain within 30-60 minutes of any procedure/intervention, per Pain Assessment, Intervention, Reassessment (AIR) Cycle   Evaluate if patient comfort function goal is met   Offer non-pharmacological pain management interventions     Problem: Side Effects from Pain Analgesia  Goal: Patient will experience minimal side effects of analgesic therapy  Outcome: Progressing  Flowsheets (Taken 03/24/2021 1613)  Patient will experience minimal side effects of analgesic therapy:   Monitor/assess patient's respiratory status (RR depth, effort, breath sounds)   Assess for changes in cognitive  function   Evaluate for opioid-induced sedation with appropriate assessment tool (i.e. POSS)     Problem: Discharge Barriers  Goal: Patient will be discharged home or other facility with appropriate resources  Outcome: Progressing  Flowsheets (Taken 03/24/2021 1613)  Discharge to home or other facility with appropriate resources: Provide appropriate patient education     Problem: Psychosocial and Spiritual Needs  Goal: Demonstrates ability to cope with hospitalization/illness  Outcome: Progressing  Flowsheets (Taken 03/24/2021 1613)  Demonstrates ability to cope with hospitalizations/illness:   Assist patient to identify own strengths and abilities   Reinforce positive adaptation of new coping behaviors     Problem: Compromised Hemodynamic Status  Goal: Vital signs and fluid balance maintained/improved  Outcome: Progressing  Flowsheets (Taken 03/24/2021 1613)  Vital signs and fluid balance are maintained/improved:   Position patient for maximum circulation/cardiac output   Monitor/assess vitals and hemodynamic parameters with position changes   Monitor and compare daily weight   Monitor intake and output. Notify LIP if urine output is less than 30 mL/hour.   Monitor/assess lab values and report abnormal values     Problem: Inadequate Gas Exchange  Goal: Adequate oxygenation and improved ventilation  Outcome: Progressing  Flowsheets (Taken 03/24/2021 1613)  Adequate oxygenation and improved ventilation:   Assess lung sounds   Position for maximum ventilatory efficiency   Teach/reinforce use of incentive spirometer 10 times per hour while awake, cough and deep breath as needed   Plan activities to conserve energy: plan rest periods   Increase activity as tolerated/progressive mobility

## 2021-03-24 NOTE — UM Notes (Signed)
[facility] Utilization Review NPI: [id number], Tax ID: [id number] Please Call [phone] with any questions or concerns. Confidential voicemail. Fax final authorizations and requests for additional information to [phone]       CSR 5/4:  Admitted for Acute Resp Distress 2' COPD exacerbation, on home O2    states ongoing shortness of breath with chest tightness this morning. Minimally improvement following duo-nebs. Endorses chronic back pain. No fever, chills, SOB, chest pain, N/V/D. 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    Patient chronically on home O2 seems to be at baseline    VS: 98.2-80-26 113/72 sats 92/3L    Labs: WBC 10.44, Glu 161, CRP 6.2, Sed Rate 26, D-dimer 0.61    ABG: pH 7.345, pO2 169.0, HCO3 20.7, Total CO2 42.1, Base Excess -4.1    BCX: no growth x1day    CTA Chest: There is suboptimal opacification of the pulmonary arteries particularly in the upper lobes. No definite pulmonary embolism is  noted. Bilateral groundglass infiltrates      Scheduled Meds:  Current Facility-Administered Medications  Medication Dose Route Frequency   albuterol-ipratropium  3 mL Nebulization Q6H [place]   atomoxetine  80 mg Oral Daily   azithromycin  500 mg Intravenous Daily   cefTRIAXone  1 g Intravenous Q24H   cetirizine  10 mg Oral Daily   famotidine  20 mg Oral BID   heparin (porcine)  5,000 Units Subcutaneous Q8H [place]   lidocaine  1 patch Transdermal Q24H   methylPREDNISolone  80 mg Intravenous TID   montelukast  10 mg Oral QHS   nicotine  1 patch Transdermal Daily   oxybutynin  10 mg Oral TID   risperiDONE  3 mg Oral BID   sertraline  100  mg Oral BID    Continuous Infusions:  PRN Meds:.acetaminophen **OR** acetaminophen, ALPRAZolam, Nursing communication: Adult Hypoglycemia Treatment Algorithm **AND** glucagon (rDNA) **AND** dextrose **AND** dextrose **AND** dextrose, gabapentin, guaiFENesin, melatonin, naloxone, ondansetron **OR** ondansetron, oxyCODONE        Assessment/Plan:  Acute respiratory distress secondary to COPD exacerbation  Chronic respiratory failure secondary to COPD  Bilateral pulmonary infiltrate possible NSIP  CT chest with patchy groundglass like opacities throughout the lung fields bilaterally, mild cylindrical bronchiectasis, parenchymal scarring and honeycombing throughout lung apices.  Enlarged main pulmonary trunk suggestive of underlying pulmonary arterial hypertension.  -Increase steroids 80 mg every 8 hours  -Titrate oxygen to maintain O2 saturations 88 to 94%  -Continue ceftazidime  -Continue DuoNebs every 6 hours  -Antitussives as needed  -We will check D-dimer  -Appreciate pulmonology evaluation, check BNP echo and serial troponins to rule out cardiac etiology.  Additionally work-up ordered for underlying connective tissue disease-ESR, ANA, Rh factor.  Respiratory culture and viral panel pending.   -Continue educating on the importance of smoking cessation    Chronic back pain  -Continue gabapentin and oxycodone    History of OSA  -Outpatient sleep study     Morbid obesity  -Lifestyle modification with diet and exercise on discharge    Anxiety/depression  -Continue home Xanax, atomoxetine, risperidone, and sertraline    Tobacco dependence  -Cessation advised, continue nicotine  patch-    GERD  -Pepcid added     Dysuria  -Check UA      Analgesia: Tylenol, oxycodone, gabapentin    Nutrition: regular    DVT Prophylaxis:Heparin

## 2021-03-24 NOTE — Respiratory Progress Note (Signed)
Respiratory Therapy Patient Assessment    A2112/A2112-A  03/24/21 9:48 PM  RT: [place], RT      Admitting DX: SOB (shortness of breath) [R06.02]    Pulmonary History: COPD    Other Pulm Hx:      Therapy ordered:       Respiratory Orders   (From admission, onward)             Start     Ordered    03/24/21 2100  CPAP continuous-not intubated  QHS (RT)      Question Answer Comment   PEEP 0    FLOW Start at: (Liters per minute) 0        03/24/21 1901    03/23/21 0800  Nebulizer treatment intermittent  Every 6 hours scheduled (RT)      Comments:   All Adult patients ordered for Respiratory Therapy, i.e., inhaled meds, secretion clearance/lung expansion or Oxygen greater than 5 liters/min will be evaluated by a Respiratory Therapist and assessed per Respiratory Therapy Patient Driven Protocol.  Initial assessment and changes made per protocol can be found in the progress note section of the patient chart.    03/23/21 0627    03/23/21 0800  Resp Re-Assess Adult (RT Use Only)  2 times daily (RT)       03/23/21 5409               IP Meds - Nasal and Inhaled (From admission, onward)            Start     Stop Status Route Frequency Ordered    03/23/21 0039  albuterol-ipratropium (DUO-NEB) 2.5-0.5(3) mg/3 mL nebulizer 3 mL         05/05 0159 Dispensed NEBULIZATION RT - Every 6 hours scheduled 03/23/21 0038             PT able to take deep breath? Yes Incentive Spirometry Goal (mL): 7000 mL  Incentive Spirometry Achieved (mL): 500 mL       Surgical Status: None  Airway: Natural   Mobility: Ambulatory with or without assistance  CXR: progressive diffuse lung dissease    Cough Effort: Strong            Can clear secretions with cough? Yes  Can clear secretions with suctioning? N/A     Social History     Tobacco Use   Smoking Status Current Every Day Smoker    Packs/day: 0.25    Years: 25.00    Pack years: 6.25    Types: Cigarettes    Last attempt to quit: 12/29/2018    Years since quitting: 2.2   Smokeless Tobacco Never  Used        Breath Sounds:  Bilateral Breath Sounds: Diminished  R Breath Sounds: Inspiratory wheezes, Expiratory wheezes  L Breath Sounds: Expiratory wheezes    Heart Rate: 88 Resp Rate: 22  SpO2: 96 % O2 Device: None (Room air)     O2 Flow Rate (L/min): 2 L/min    Home regimen:  Home Treatments: Albuterol MDI PRN  Home Oxygen: 2L PRN   Home CPAP/BiLevel: no    Criteria for therapy:  Secretion Clearance: None indicated  Lung Expansion: None indicated  Medications: Hx of COPD, Asthma, Bronchitis or other documented RAD    Recommendations/Interventions:  Recommendations/Interventions: Bronchodilators     Expected Outcomes:  Secretion: Improved breath sound  Lung Expansion: Improved air movement at the bases  Meds: Decreased bronchospasms, Return to baseline home regimen  Re-Evaluation:  Follow-up Date:   Improving with Therapy: No    Plan of Care Recommendations:  Plan of Care: Continue with current therapy

## 2021-03-25 LAB — RESPIRATORY PATHOGEN PANEL WITH COVID-19,PCR
Adenovirus: NOT DETECTED
Bordetella parapertussis PCR: NOT DETECTED
Bordetella pertussis: NOT DETECTED
Chlamydophila pneumoniae: NOT DETECTED
Coronavirus 229E: NOT DETECTED
Coronavirus HKU1: NOT DETECTED
Coronavirus NL63: NOT DETECTED
Coronavirus OC43: NOT DETECTED
Human Metapneumovirus: NOT DETECTED
Human Rhinovirus/Enterovirus: NOT DETECTED
Influenza A/H1: NOT DETECTED
Influenza A/H3: NOT DETECTED
Influenza A: NOT DETECTED
Influenza AH1 - 2009: NOT DETECTED
Influenza B: NOT DETECTED
Mycoplasma pneumoniae: NOT DETECTED
Parainfluenza Virus 1: NOT DETECTED
Parainfluenza Virus 2: NOT DETECTED
Parainfluenza Virus 3: NOT DETECTED
Parainfluenza Virus 4: NOT DETECTED
Respiratory Syncytial Virus: NOT DETECTED
SARS CoV-2 RNA: NOT DETECTED

## 2021-03-25 LAB — CBC
Absolute NRBC: 0 10*3/uL (ref 0.00–0.00)
Hematocrit: 35.8 % (ref 34.7–43.7)
Hgb: 11.2 g/dL — ABNORMAL LOW (ref 11.4–14.8)
MCH: 28.6 pg (ref 25.1–33.5)
MCHC: 31.3 g/dL — ABNORMAL LOW (ref 31.5–35.8)
MCV: 91.3 fL (ref 78.0–96.0)
MPV: 10.8 fL (ref 8.9–12.5)
Nucleated RBC: 0 /100 WBC (ref 0.0–0.0)
Platelets: 222 10*3/uL (ref 142–346)
RBC: 3.92 10*6/uL (ref 3.90–5.10)
RDW: 14 % (ref 11–15)
WBC: 14.93 10*3/uL — ABNORMAL HIGH (ref 3.10–9.50)

## 2021-03-25 LAB — GFR: EGFR: >60

## 2021-03-25 LAB — BASIC METABOLIC PANEL
Anion Gap: 10 (ref 5.0–15.0)
BUN: 9 mg/dL (ref 7.0–19.0)
CO2: 23 mEq/L (ref 22–29)
Calcium: 9 mg/dL (ref 8.5–10.5)
Chloride: 103 mEq/L (ref 100–111)
Creatinine: 0.6 mg/dL (ref 0.6–1.0)
Glucose: 163 mg/dL — ABNORMAL HIGH (ref 70–100)
Potassium: 5.4 mEq/L — ABNORMAL HIGH (ref 3.5–5.1)
Sodium: 136 mEq/L (ref 136–145)

## 2021-03-25 LAB — ANA SCREEN ONLY: ANA Screen: NEGATIVE

## 2021-03-25 LAB — HIV-1/2 AG/AB 4TH GEN. W/ REFLEX: HIV Ag/Ab, 4th Generation: NONREACTIVE

## 2021-03-25 MED ORDER — OXYCODONE HCL 5 MG PO TABS
10.0000 mg | ORAL_TABLET | Freq: Four times a day (QID) | ORAL | Status: DC | PRN
  Administered 2021-03-25 – 2021-03-27 (×7): 10 mg via ORAL
  Filled 2021-03-25 (×8): qty 2

## 2021-03-25 MED ORDER — ALBUTEROL-IPRATROPIUM 2.5-0.5 (3) MG/3ML IN SOLN
3.0000 mL | Freq: Two times a day (BID) | RESPIRATORY_TRACT | Status: DC
Start: 2021-03-25 — End: 2021-03-28
  Administered 2021-03-25 – 2021-03-28 (×6): 3 mL via RESPIRATORY_TRACT
  Filled 2021-03-25 (×5): qty 3

## 2021-03-25 MED ORDER — SODIUM POLYSTYRENE SULFONATE 15 GM/60ML PO SUSP
15.0000 g | Freq: Once | ORAL | Status: AC
Start: 2021-03-25 — End: 2021-03-25
  Administered 2021-03-25: 15 g via ORAL
  Filled 2021-03-25: qty 60

## 2021-03-25 MED ORDER — ALBUTEROL-IPRATROPIUM 2.5-0.5 (3) MG/3ML IN SOLN
3.0000 mL | Freq: Four times a day (QID) | RESPIRATORY_TRACT | Status: DC
Start: 2021-03-25 — End: 2021-03-25
  Administered 2021-03-25 (×2): 3 mL via RESPIRATORY_TRACT
  Filled 2021-03-25 (×2): qty 3

## 2021-03-25 NOTE — PT Eval Note (Signed)
[facility]  Physical Therapy Evaluation and Treatment    Patient: [name]  MRN#: [id number]  Unit: [address] UNIT 21  Bed: A2112/A2112-A    Time of Evaluation and Treatment:  Time Calculation  PT Received On: 03/25/21  Start Time: 1220  Stop Time: 1255  Time Calculation (min): 35 min    Evaluation Time: 10 minutes  Treatment Time: 25 minutes    Chart Review and Collaboration with Care Team: 5 minutes, not included in above time    PT Visit Number: 1    Consult received for [name] for PT Evaluation and Treatment.  Patient's medical condition is appropriate for Physical therapy intervention at this time.    ___________________________________________________    POST ACUTE CARE THERAPY RECOMMENDATIONS:   Discharge Recommendation: Home with supervision, Home with home health PT, Home with home health OT (Pt states her fiance can assist)  May need transport to get up 3 FOS       DME Recommended for Discharge: No additional equipment/DME recommended at this time      ACUTE CARE THERAPY RECOMMENDATIONS:  Pt would benefit from Physical Therapy to address deficits and increase functional independence.     Is an Occupational Therapy Evaluation Indicated at this time? No, an acute care OT evaluation is not required at this time.      (Therapy recommendations are subject to change with patient status.  Please refer to the most recent PT/OT note for up-to-date recommendations.)  ___________________________________________________      Activity Orders:  OT eval and treat, PT eval and treat and progressive mobility protocol    Precautions and Contraindications:  Precautions  Other Precautions: High falls, BMI    Personal Protective Equipment (PPE)  gloves, procedure mask and eye shield/covering    Medical Diagnosis:  SOB (shortness of breath) [R06.02]    History of Present Illness:  [name] is a 50 y.o. female admitted on 03/22/2021 with SOB.    Patient Active Problem List   Diagnosis   .  Multifocal pneumonia   . COPD with acute exacerbation   . CAD (coronary artery disease)   . OSA (obstructive sleep apnea)   . Anxiety   . Overactive bladder   . PNA (pneumonia)   . Chest tightness   . Acute bronchospasm due to viral infection   . SOB (shortness of breath)       Past Medical/Surgical History:  Past Medical History:   Diagnosis Date   . Anxiety    . Chronic obstructive pulmonary disease    . Panic attacks      Past Surgical History:   Procedure Laterality Date   . APPENDECTOMY (OPEN)     . ARTHROSCOPIC KNEE, ACL RECONSTRUCTION      R. knee    . CHOLECYSTECTOMY         X-Rays/Tests/Labs:  Lab Results   Component Value Date/Time    HGB 11.2 (L) 03/25/2021 04:30 AM    HCT 35.8 03/25/2021 04:30 AM    K 5.4 (H) 03/25/2021 04:30 AM    NA 136 03/25/2021 04:30 AM    INR 0.9 03/23/2021 03:06 AM    TROPI <0.01 03/23/2021 05:04 PM    TROPI <0.01 03/23/2021 03:06 AM    TROPI <0.01 03/22/2021 07:49 PM    TROPI <0.01 10/10/2020 10:16 AM       All imaging reviewed, please see chart for details.    Social History:  Prior Level of Function  Prior  level of function: Ambulates with assistive device, Needs assistance with ADLs (Fiance assists as needed)  Assistive Device: Four wheel walker  Baseline Activity Level: Household ambulation  Driving: does not drive  DME Currently at Home: Home O2, Portable O2 Tank    Home Living Arrangements  Living Arrangements: Spouse/significant other, Family members  Type of Home: Apartment  Home Layout: One level, Stairs to enter with rails (add number in comment) (3 FOS)  DME Currently at Home: Home O2, Portable O2 Tank      Subjective:  Patient is agreeable to participation in the therapy session. Nursing clears patient for therapy.  Patient Goal: Breathe better  Pain Assessment  Pain Assessment: Numeric Scale (0-10)  Pain Score: 5-moderate pain  POSS Score: Awake and Alert  Pain Location: Knee  Pain Orientation: Right;Left  Pain Frequency: Continuous;Increases with movement  Effect of  Pain on Daily Activities: moderate  Pain Intervention(s): Repositioned;Ambulation/increased activity      Objective:  Observation of Patient/Vital Signs:  BP 118/80   Pulse 75   Temp 98.4 F (36.9 C) (Oral)   Resp 14   Ht 1.753 m (5' 9")   Wt (!) 164.5 kg (362 lb 10.5 oz)   SpO2 93%   BMI 53.56 kg/m     SpO2 remains stable at EOB.  W/ sit to stand and amb 15' in room dropped to 79% took ~ 90 sec to return to  90's.     Cognitive Status and Neuro Exam:  Cognition/Neuro Status  Arousal/Alertness: Appropriate responses to stimuli  Attention Span: Appears intact  Orientation Level: Oriented X4  Memory: Appears intact  Following Commands: Follows all commands and directions without difficulty  Safety Awareness: minimal verbal instruction  Insights: Fully aware of deficits;Educated in safety awareness  Behavior: calm;cooperative  Motor Planning: intact    Musculoskeletal Examination  Gross ROM  Right Upper Extremity ROM: within functional limits  Left Upper Extremity ROM: within functional limits  Right Lower Extremity ROM: within functional limits (Knee limited 2/2 pain)  Left Lower Extremity ROM: within functional limits (Knee limited 2/2 pain)  Gross Strength  Right Upper Extremity Strength: within functional limits  Left Upper Extremity Strength: within functional limits  Right Lower Extremity Strength: within functional limits (Knee NT)  Left Lower Extremity Strength: within functional limits (Knee NT)       Functional Mobility:  Functional Mobility  Supine to Sit: Modified Independent;Increased Effort;HOB raised  Scooting to EOB: Modified Independent  Sit to Stand: Contact Guard Assist;Minimal Assist;Increased Time;Increased Effort;with instruction for hand placement to increase safety (Pt using rocking method/momentum to come to standing)  Stand to Sit: Contact Guard Assist (Inst to reach back to chair)     Locomotion  Ambulation: Stand by Assist;with front-wheeled walker (15')  Pattern: R foot decreased  clearance;L foot decreased clearance;decreased cadence;decreased step length     Balance  Balance  Balance: within functional limits (w/ RW)    Participation and Activity Tolerance  Participation and Endurance  Participation Effort: good  Endurance: Tolerates < 10 min exercise with changes in vital signs  [place] Dyspnea Scale: 3+ Dyspnea (Following 89' of amb)    Educated the Patient to role of physical therapy, plan of care, goals of therapy and safety with mobility and ADLs with verbalized understanding .    Patient left in bedside chair with all medical equipment in place and call bell and all personal items/needs within reach (of note, pt received without alarm in place).  RN  notified of session outcome.        Assessment:  [name] is a 50 y.o. female admitted 03/22/2021.  PT Assessment  Assessment: Decreased LE strength;Decreased endurance/activity tolerance;Decreased functional mobility;Decreased balance;Gait impairment  Prognosis: Good;With continued PT status post acute discharge  Progress: Slow progress, decreased activity tolerance      Treatment:  -Educated pt on importance of OOB in chair and increased activity for safe return home.  Pt verbalized understanding.  -Pt able to sit EOB w/sup.  Unable to get good BOS under at EOB 2/2 limited knee flex  -From chair sit<>stand x1 w/ close CGA and inst for tech.  Stood at chair x 90 sec and had to sit 2/2 knee pain  -In chair BUE/BLE x10  AP, marching, shldr flex, triceps pushups and punches  -Pt in chair w/ all needs in place and questions answered  -Discussed OOB in chair, standing w/ RN    Plan:  Treatment/Interventions: Exercise, Gait training, Stair training, Functional transfer training, LE strengthening/ROM, Patient/family training, Equipment eval/education  PT Frequency: 2-3x/wk  Risks/Benefits/POC Discussed with Pt/Family: With patient    PMP Activity: Step 6 - Walks in Room  Distance Walked (ft) (Step 6,7): 15  Feet      Goals:  Goals  Goal Formulation: With patient  Time for Goal Acheivement: By time of discharge  Goals: Select goal  Pt Will Perform Sit to Stand: with supervision, to maximize functional mobility and independence  Pt Will Ambulate: 51-100 feet, with rolling walker, with supervision  Pt Will Go Up / Down Stairs: 1 flight, modified independent, With rail, to maximize functional mobility and independence  Pt Will Perform Home Exer Program: independent, to maximize functional mobility and independence    Iva Lento, PT [phone]  03/25/2021 1:08 PM    Monday - Wednesday 12:30 - 21:00  Thursday - Friday        8:00 - 16:30

## 2021-03-25 NOTE — Plan of Care (Signed)
Progress Note  Patient remained free from falls and injury throughout shift. Safety and fall precautions have been implemented per protocol. purposeful rounding done.   Alert & Oriented: x 4  Pain: 7/10 Roxicodone increased to 10 mg; given with relief  Vitals: VSS  Telemetry: NSR  Assessment: Pt c/o back pain. Pt denies HA, dizziness, blurred vision, CP, SOB, N/V, diarrhea/constipation.   VTE Prophylaxis: Lovenox  Diet: Changed to Renal  D/C Plan: no expected discharge/ IV steroids, CPAP QSH RT aware & will come set up.   Problem: Safety  Goal: Patient will be free from injury during hospitalization  Outcome: Progressing  Flowsheets (Taken 03/25/2021 1825)  Patient will be free from injury during hospitalization:   Assess patient's risk for falls and implement fall prevention plan of care per policy   Provide and maintain safe environment   Use appropriate transfer methods   Ensure appropriate safety devices are available at the bedside   Include patient/ family/ care giver in decisions related to safety   Hourly rounding   Assess for patients risk for elopement and implement [place] per policy   Provide alternative method of communication if needed (communication boards, writing)  Goal: Patient will be free from infection during hospitalization  Outcome: Progressing  Flowsheets (Taken 03/25/2021 1825)  Free from Infection during hospitalization:   Assess and monitor for signs and symptoms of infection   Monitor lab/diagnostic results   Monitor all insertion sites (i.e. indwelling lines, tubes, urinary catheters, and drains)   Encourage patient and family to use good hand hygiene technique     Problem: Pain  Goal: Pain at adequate level as identified by patient  Outcome: Progressing  Flowsheets (Taken 03/25/2021 1825)  Pain at adequate level as identified by patient:   Identify patient comfort function goal   Assess for risk of opioid induced respiratory depression, including snoring/sleep apnea. Alert  healthcare team of risk factors identified.   Assess pain on admission, during daily assessment and/or before any "as needed" intervention(s)   Reassess pain within 30-60 minutes of any procedure/intervention, per Pain Assessment, Intervention, Reassessment (AIR) Cycle   Evaluate if patient comfort function goal is met   Evaluate patient's satisfaction with pain management progress   Offer non-pharmacological pain management interventions   Consult/collaborate with Pain Service   Consult/collaborate with Physical Therapy, Occupational Therapy, and/or Speech Therapy   Include patient/patient care companion in decisions related to pain management as needed     Problem: Side Effects from Pain Analgesia  Goal: Patient will experience minimal side effects of analgesic therapy  Outcome: Progressing  Flowsheets (Taken 03/25/2021 1825)  Patient will experience minimal side effects of analgesic therapy:   Monitor/assess patient's respiratory status (RR depth, effort, breath sounds)   Assess for changes in cognitive function     Problem: Discharge Barriers  Goal: Patient will be discharged home or other facility with appropriate resources  Outcome: Progressing  Flowsheets (Taken 03/25/2021 1825)  Discharge to home or other facility with appropriate resources: Provide appropriate patient education     Problem: Psychosocial and Spiritual Needs  Goal: Demonstrates ability to cope with hospitalization/illness  Outcome: Progressing  Flowsheets (Taken 03/25/2021 1825)  Demonstrates ability to cope with hospitalizations/illness:   Encourage verbalization of feelings/concerns/expectations   Provide quiet environment   Encourage participation in diversional activity     Problem: Compromised Hemodynamic Status  Goal: Vital signs and fluid balance maintained/improved  Outcome: Progressing  Flowsheets (Taken 03/25/2021 1825)  Vital signs and fluid  balance are maintained/improved:   Position patient for maximum circulation/cardiac output    Monitor/assess vitals and hemodynamic parameters with position changes   Monitor and compare daily weight   Monitor intake and output. Notify LIP if urine output is less than 30 mL/hour.   Monitor/assess lab values and report abnormal values     Problem: Inadequate Gas Exchange  Goal: Adequate oxygenation and improved ventilation  Outcome: Progressing  Flowsheets (Taken 03/25/2021 1825)  Adequate oxygenation and improved ventilation:   Assess lung sounds   Plan activities to conserve energy: plan rest periods   Teach/reinforce use of incentive spirometer 10 times per hour while awake, cough and deep breath as needed

## 2021-03-25 NOTE — Progress Notes (Signed)
Patient was admitted d/t SOB. Diagnoses include pulmonary HTN; chronic back pain; morbid obesity; tobacco dependence; H/O OSA. Two (2) liters O2; IV Zithromax, Rocephin; heparin injection; PO Risperdal. Medicine, Pulmonology following. Plan for possible bronchoscopy 03/28/2021; OP sleep study TBD; CPAP at night TBD. DCP: Home, skilled services TBD. Transport: TBD.       03/25/21 1851   Patient Type   Within 30 Days of Previous Admission? No   Healthcare Decisions   Interviewed: Patient;Other (Comment)  (Medical record review.)   Orientation/Decision Making Abilities of Patient Alert and Oriented x3, able to make decisions   Prior to admission   Prior level of function Needs assistance with ADLs;Ambulates with assistive device   Type of Residence Private residence   Home Layout One level;Other (Comment)  (Apartment, 3 flights of steps to enter.)   Have running water, electricity, heat, etc? Yes   Living Arrangements Spouse/significant other;Family members   How do you get to your MD appointments? Family   How do you get your groceries? Family   Who fixes your meals? Self   Who does your laundry? Self   Who picks up your prescriptions? Self   Dressing Needs assistance   Grooming Independent   Feeding Independent   Bathing UTA   Toileting Independent   DME Currently at [facility], [facility];Home O2   Discharge Planning   Support Systems Spouse/significant other;Family members   Patient expects to be discharged to: Home. Patient declined [facility] services upon discharge.   Anticipated [place] plan discussed with: Same as interviewed   Mode of transportation: Other  (TBD.)   Does the patient have perscription coverage? Yes   Financial Resource Strain   How hard is it for you to pay for the very basics like food, housing, medical care, and heating? Not hard   Housing Stability   In the last 12 months, was there a time when you were not able to pay the mortgage or rent on time? N   In the last 12 months, how many places have you  lived? 1   In the last 12 months, was there a time when you did not have a steady place to sleep or slept in a shelter (including now)? N   Transportation Needs   In the past 12 months, has lack of transportation kept you from medical appointments or from getting medications? no   In the past 12 months, has lack of transportation kept you from meetings, work, or from getting things needed for daily living? No   Consults/Providers   PT Evaluation Needed 1   OT Evalulation Needed 2   SLP Evaluation Needed 2   Outcome Palliative Care Screen Screened but did not meet criteria for intervention   Correct PCP listed in Epic? Yes   Family and PCP   PCP on file was verified as the current PCP? Yes   In case you are admitted, transferred or discharged, would like family notified?   (Family is aware of hospitalization.)   In case you are admitted, transferred or discharged, would like your PCP notified? Unknown   Important Message from Medicare Notice   Patient received 1st IMM Letter? Mabie, MSW, ACM-SW  Social Worker Case Manager [facility]  [phone]

## 2021-03-25 NOTE — UM Notes (Signed)
CSR 03/24/21      Subjective:   Doing better, lessshortness of breath  3 L O2 96%  Chest x-ray with some progression bilateral infiltrate  BNP/procalcitonin are normal C-reactive protein is elevated    CXR 5/5:  Progressive diffuse lung disease      Pulmonary Assessment/Plan:   Interstitial pneumonitis: Treated with IV steroid, awaiting work-up low BNP negative procalcitonin elevated C-reactive protein, negative COVID negative rheumatoid factor awaiting ANA.  Check HIV, clinically feeling better,.continue with steroid.  Monitor clinically and radiographically for improvement/progression.    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  Enlarged main pulmonary trunk suggestive of underlying pulmonary arterial hypertension. CTA chest without definite evidence of PE, again notes bilateral ground glass infiltrates.  -Solumedrol 80 mg every 8 hours  -Titrate oxygen to maintain O2 saturations 88 to 94%  -Continue ceftazidime  -Continue DuoNebs every 6 hours  -Antitussives as needed  -BNP and serial troponins wnl, Echocardiogram pending  -ESR and CRP elevated, ANA pending. Rh negative.  -Sputum culture contaminated, RVP pending collection  -Appreciate pulmonology evaluation, likely nonspecific interstitial pneumonitis. Possible bronchoscopy if no improvement.  -Continue educating on the importance of smoking cessation    Altered mental status  Patient somnolent this morning, awakens briefly to touch. Vitals stable, protecting airway. Has  not received any prns this morning (No oxycodone, xanax, or gabapentin)  - STAT ABG ordered    Chronic back pain  -Hold gabapentin and oxycodone until mental status improves    History of OSA  -Outpatient sleep study     Morbid obesity  -Lifestyle modification with diet and exercise on discharge    Anxiety/depression  -Continue home Xanax, atomoxetine, risperidone, and sertraline    Tobacco dependence  -Cessation advised, continue nicotine patch-    GERD  -Pepcid added     Dysuria  -Trace LE, continue abx as above      Analgesia: Tylenol, oxycodone, gabapentin    Nutrition: regular    DVT Prophylaxis:Heparin       Code Status: Full    DISPO: Pending improvement in respiratory symptoms. Likely 2-3 days.

## 2021-03-25 NOTE — Progress Notes (Signed)
[facility]  PROGRESS NOTE      Patient: [name]  Date: 03/25/2021   LOS: 3 Days  Admission Date: 03/22/2021   MRN: [id number]  Attending: Dr. Romana Juniper Blair Hailey, PA-C  Please contact me on secure chat or the following Spectralink [phone]       ASSESSMENT/PLAN     [name] is a 50 y.o. female with a PMHx of tobacco dependence, chronic respiratory failure secondary to COPD (O2 dependent on 2 L via NC), history of psychosis, anxiety/depression, OSA (not using CPAP) morbid obesity, and chronic back pain admitted with acute COPD exacerbation.      Interval Summary:     This morning patient in bed, tachypnea/dyspneic with speech. Coarse breath sounds, wheezing slightly improved from yesterday. Plan to continue IV steroids, PT/OT evaluation today. Morning labs with hyperkalemia. Tentative plan for bronchoscopy on Monday if no improvement.     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  Enlarged main pulmonary trunk suggestive of underlying pulmonary arterial hypertension. CTA chest without definite evidence of PE, again notes bilateral ground glass infiltrates.  -Solumedrol 80 mg every 8 hours  -Titrate oxygen to maintain O2 saturations 88 to 94%  -Ceftazidime adjusted 5/5 azithromycin + ceftriaxone  -Continue DuoNebs every 6 hours  -Antitussives as needed  -BNP and serial troponins wnl, Echocardiogram EF 60-65%, mild TR, moderate pulm htn  -ESR and CRP elevated, ANA pending. Rh negative. HIV non-reactive.  -Sputum culture contaminated, RVP negative  -Appreciate pulmonology evaluation, likely nonspecific interstitial pneumonitis. Possible bronchoscopy if no  improvement.  -Continue educating on the importance of smoking cessation, patient is motivated.     Hyperkalemia  -Kayexalate and EKG ordered   -Monitor on Tele  -Will start low potassium diet  -Repeat BMP in AM    Altered mental status, resolved  Hypercarbia   -ABG with stable pH, pCO2 50.5.     Chronic back pain  -Continue home gabapentin and oxycodone at tapered doses    History of OSA  -Will start CPAP this admission  -Outpatient sleep study on discharge, patient reports insurance refusing to replace CPAP    Morbid obesity  -Lifestyle modification with diet and exercise on discharge    Anxiety/depression  -Continue home Xanax, risperidone, and sertraline. Atomoxetine non-formulary, resume on discharge    Tobacco dependence  -Cessation advised, continue nicotine patch. Will need prescription on discharge    GERD  -Pepcid     Dysuria  -Trace LE, continue abx as above      Analgesia: Tylenol, oxycodone, gabapentin    Nutrition: regular    DVT Prophylaxis:Heparin       Code Status: Full    DISPO: Pending improvement in respiratory symptoms. Likely 2-3 days.       SUBJECTIVE     [name] reports ongoing SOB that worsens with exertion (speech, getting OOB). Feels worse than baseline. No fever, chills, N/V/D. Endorses 9/10 back pain, states this is uncontrolled at home as well.     MEDICATIONS     Current Facility-Administered Medications   Medication Dose Route Frequency   . albuterol-ipratropium  3 mL Nebulization Q6H [place]   . atomoxetine  80 mg Oral Daily   . azithromycin  500 mg Intravenous Daily   . cefTRIAXone  1 g Intravenous Q24H   . cetirizine  10 mg Oral Daily   . famotidine  20 mg Oral BID   . heparin (porcine)  5,000 Units Subcutaneous Q8H [place]   . lidocaine  1 patch Transdermal Q24H   . methylPREDNISolone  80 mg Intravenous TID   . montelukast  10 mg Oral QHS   . nicotine  1 patch Transdermal Daily   . oxybutynin  10 mg Oral TID   . risperiDONE  3 mg Oral BID   . sertraline  100 mg Oral BID        PHYSICAL EXAM     Vitals:    03/25/21 0840   BP:    Pulse:    Resp:    Temp:    SpO2: 95%       Temperature: Temp  Min: 98.1 F (36.7 C)  Max: 98.6 F (37 C)  Pulse: Pulse  Min: 72  Max: 98  Respiratory: Resp  Min: 20  Max: 24  Non-Invasive BP: BP  Min: 101/64  Max: 111/72  Pulse Oximetry SpO2  Min: 93 %  Max: 96 %    Intake and Output Summary (Last 24 hours) at Date Time    Intake/Output Summary (Last 24 hours) at 03/25/2021 1017  Last data filed at 03/25/2021 0900  Gross per 24 hour   Intake 1790 ml   Output 1600 ml   Net 190 ml       GEN APPEARANCE: Dyspneic with speech, not in distress, A&Ox3  HEENT: PERLA; EOMI; Conjunctiva Clear  NECK: Supple; No bruits  CVS: RRR, S1, S2; No M/G/R  LUNGS: Coarse breath sounds bilaterally  ABD: Obese abdomen, Soft; No TTP; + Normoactive BS  EXT: No edema; Pulses 2+ and intact  Skin exam:  no pallor  NEURO: CN 2-12 intact; No Focal neurological deficits  CAP REFILL:  Normal  MENTAL STATUS:  Altered    Exam done by Edwin Dada, PA on 03/25/21 at 10:17 AM      LABS     Recent Labs   Lab 03/25/21  0430 03/24/21  0805 03/23/21  0629   WBC 14.93* 11.07* 10.44*   RBC 3.92 4.09 4.08   Hgb 11.2* 12.1 12.0   Hematocrit 35.8 37.8 37.2   MCV 91.3 92.4 91.2   Platelets 222 277 295       Recent Labs   Lab 03/25/21  0430 03/24/21  0805 03/23/21  0306 03/22/21  1949   Sodium 136 142 138 137   Potassium 5.4* 5.2* 5.0 4.5   Chloride 103 110 105 105   CO2 23 23 20* 23   BUN 9.0 9.0 8.0 9.0   Creatinine 0.6 0.6 0.6 0.6   Glucose 163* 139* 161* 114*   Calcium 9.0 10.1 9.1 9.2       Recent Labs   Lab 03/22/21  1949   ALT 10   AST (SGOT) 13   Bilirubin, Total 0.5   Albumin 2.9*   Alkaline Phosphatase 96       Recent Labs   Lab 03/23/21  1704 03/23/21  0306 03/22/21  1949   Troponin I <0.01 <0.01 <0.01       Recent Labs   Lab 03/23/21  0306   PT INR 0.9   PT 10.2       Microbiology Results (last 15 days)     Procedure Component Value Units Date/Time  Respiratory Pathogen Panel w/COVID-19, PCR  [[id number]] Collected: 03/24/21 1307    Order Status: Completed Specimen: Nasopharyngeal Updated: 03/25/21 0512     Adenovirus Not Detected     Coronavirus 229E Not Detected     Coronavirus HKU1 Not Detected     Coronavirus NL63 Not Detected     Coronavirus OC43 Not Detected     SARS CoV-2 RNA Not Detected     Human Metapneumovirus Not Detected     Human Rhinovirus/Enterovirus Not Detected     Influenza A Not Detected     Influenza A/H1 Not Detected     Influenza AH1 - 2009 Not Detected     Influenza A/H3 Not Detected     Influenza B Not Detected     Parainfluenza Virus 1 Not Detected     Parainfluenza Virus 2 Not Detected     Parainfluenza Virus 3 Not Detected     Parainfluenza Virus 4 Not Detected     Respiratory Syncytial Virus Not Detected     Bordetella pertussis Not Detected     Bordetella parapertussis PCR Not Detected     Chlamydophila pneumoniae Not Detected     Mycoplasma pneumoniae Not Detected     Source NP Swab     Test Comment for Respiratory Panel with COVID See Below     Comment: Testing performed using the [employer] Film[employer]  Respiratory Panel (RP 2.1)  This test is for the qualitative detection of  respiratory pathogen nucleic acid. This assay cannot  differentiate between Rhinovirus/Enterovirus. If  necessary for patient care, a positive result for  Rhinovirus/Enterovirus may be followed-up using an  alternate method. This assay should not be used if  B. pertussis infection is specifically suspected as it  is less sensitive than other alternatives. If suspected,  ensure that B. pertussis specific testing is ordered.  Recent administration of a nasal influenza vaccine may  cause false positive results for Influenza A and/or  Influenza B.  Viral and bacterial nucleic acids may persist even  though no viable organism is present. Detection of  nucleic acid does not imply that the corresponding  organisms are infectious or are the causative agents of  clinical symptoms. Positive results of this test do  not  rule out coinfection with other organisms. A negative  result does not exclude the possibility of viral or  bacterial infection.  Assay performance characteristics  may vary with circulating strains and this assay may not  be able to distinguish between existing viral strains  and new variants as they emerge. Results of this test  should not be used as the sole basis for diagnosis,  treatment, or other patient management decisions.  This assay is FDA authorized for nasopharyngeal swab  samples.  Performance characteristics for  Bronchoalveolar lavage samples have been determined by  the [facility] laboratory. Other sample types are unacceptable.  Invalid results may be due to inhibiting substances in  the specimen and recollection should occur.          Purpose of COVID testing Diagnostic -PUI    Narrative:      For NP, Call laboratory for VTM NP Swab collection device.  Bronchoalveolar Lavage is submitted in a sterile container.  Diagnostic -PUI    CULTURE + Lacretia Leigh [[id number]] Collected: 03/23/21 2208    Order Status: Completed Specimen: Sputum, Expectorated Updated: 03/24/21 0620    Narrative:      Results Faxed [phone], by [id number] on 03/24/2021 at 06:20  Culture and Gram  Stain, Aerobic, Respiratory was cancelled on 03/24/21 at  06:20 by [phone]; Specimen quality is inadequate for culture  Specimen appears  to be saliva  Please submit another sputum specimen  Induction of sputum may  improve specimen quality  ORDER#: [id number]                                    ORDERED BY: JABBAR, LUBNA  SOURCE: Sputum, Expectorated as below                COLLECTED:  03/23/21 22:08  ANTIBIOTICS AT COLL.:                                RECEIVED :  03/24/21 01:47  Results Faxed [phone], by [id number] on 03/24/2021 at 06:20  Culture and Gram Stain, Aerobic, Respiratory was cancelled on 03/24/21 at 06:20 by [id number]; Specimen quality is inadequate for  culture  Specimen appears to be saliva  Please  submit another sputum specimen  Induction of sputum may improve specimen quality  Stain, Gram (Respiratory)                  FINAL       03/24/21 06:20  03/24/21   Smear contains > or = 10 squamous epithelial cells per low             power field, suggestive of significant oral contamination and             poor quality specimen. Culture not performed.             Please recollect if clinically indicated.  Culture and Gram Stain, Aerobic, RespiratorCANCELLED   03/24/21 06:20            - cancelled on 03/24/21 06:20   by  [id number]   Specimen quality is inadequate for culture  Specimen appears to be saliva   Please submit another sputum specimen  Induction of sputum may improve specimen   quality      Culture Blood Aerobic and Anaerobic [[id number]] Collected: 03/22/21 2349    Order Status: Completed Specimen: Arm from Blood, Venipuncture Updated: 03/25/21 0621    Narrative:      ORDER#: [id number]                                    ORDERED BY: HE, ALBERT  SOURCE: Blood, Venipuncture Arm                      COLLECTED:  03/22/21 23:49  ANTIBIOTICS AT COLL.:                                RECEIVED :  03/23/21 05:29  Culture Blood Aerobic and Anaerobic        PRELIM      03/25/21 06:21  03/24/21   No Growth after 1 day/s of incubation.  03/25/21   No Growth after 2 day/s of incubation.      Culture Blood Aerobic and Anaerobic [[id number]] Collected: 03/22/21 2349    Order Status: Completed Specimen: Arm from Blood, Venipuncture Updated: 03/25/21 0621    Narrative:      ORDER#: [id number]  ORDERED BY: HE, ALBERT  SOURCE: Blood, Venipuncture Arm                      COLLECTED:  03/22/21 23:49  ANTIBIOTICS AT COLL.:                                RECEIVED :  03/23/21 05:29  Culture Blood Aerobic and Anaerobic        PRELIM      03/25/21 06:21  03/24/21   No Growth after 1 day/s of incubation.  03/25/21   No Growth after 2 day/s of incubation.      COVID-19 (SARS-CoV-2) and Influenza A/B, NAA  (Liat Rapid)- Admission [[id number]] Collected: 03/22/21 2227    Order Status: Completed Specimen: Culturette from Nasopharyngeal Updated: 03/22/21 2304     Purpose of COVID testing Diagnostic -PUI     SARS-CoV-2 Specimen Source Nasal Swab     SARS CoV 2 Overall Result Not Detected     Comment: __________________________________________________  -A result of "Detected" indicates POSITIVE for the    presence of SARS CoV-2 RNA  -A result of "Not Detected" indicates NEGATIVE for the    presence of SARS CoV-2 RNA  __________________________________________________________  Test performed using the Roche cobas Liat SARS-CoV-2 assay. This assay is  only for use under the Food and Drug Administration's Emergency Use  Authorization. This is a real-time RT-PCR assay for the qualitative  detection of SARS-CoV-2 RNA. Viral nucleic acids may persist in vivo,  independent of viability. Detection of viral nucleic acid does not imply the  presence of infectious virus, or that virus nucleic acid is the cause of  clinical symptoms. Negative results do not preclude SARS-CoV-2 infection and  should not be used as the sole basis for diagnosis, treatment or other  patient management decisions. Negative results must be combined with  clinical observations, patient history, and/or epidemiological information.  Invalid results may be due to inhibiting substances in the specimen and  recollection should occur. Please see Fact Sheets for patients and providers  located:  https://www.benson-chung.com/          Influenza A Not Detected     Influenza B Not Detected     Comment: Test performed using the Roche cobas Liat SARS-CoV-2 & Influenza A/B assay.  This assay is only for use under the Food and Drug Administration's  Emergency Use Authorization. This is a multiplex real-time RT-PCR assay  intended for the simultaneous in vitro qualitative detection and  differentiation of SARS-CoV-2, influenza A, and influenza B virus RNA.  Viral  nucleic acids may persist in vivo, independent of viability. Detection of  viral nucleic acid does not imply the presence of infectious virus, or that  virus nucleic acid is the cause of clinical symptoms. Negative results do  not preclude SARS-CoV-2, influenza A, and/or influenza B infection and  should not be used as the sole basis for diagnosis, treatment or other  patient management decisions. Negative results must be combined with  clinical observations, patient history, and/or epidemiological information.  Invalid results may be due to inhibiting substances in the specimen and  recollection should occur. Please see Fact Sheets for patients and providers  located: http://olson-hall.info/.         Narrative:      o Collect and clearly label specimen type:  o PREFERRED-Upper respiratory specimen: One Nasal Swab in  [employer].  o Hand  deliver to laboratory ASAP  Diagnostic -PUI           RADIOLOGY     Upon my review:    Claris Gladden, PA  10:17 AM 03/25/2021

## 2021-03-25 NOTE — Plan of Care (Signed)
Alert and oriented x 4 and she is able to communicate her needs. Requested for pain medication and oxycodone and education was given about pain management.  Denies SOB, CP, N/V and dizziness. All safety precautions are put in place with purposefully hourly rounding done.   Problem: Moderate/High Fall Risk Score >5  Goal: Patient will remain free of falls  Flowsheets (Taken 03/24/2021 2000)  High (Greater than 13):  Marland Kitchen HIGH-Consider use of low bed  . HIGH-Initiate use of floor mats as appropriate  . HIGH-Apply yellow "Fall Risk" arm band  . HIGH-Bed alarm on at all times while patient in bed  . MOD-Request PT/OT consult order for patients with gait/mobility impairment  . MOD-Perform dangle, stand, walk (DSW) prior to mobilization  . MOD-Use of assistive devices -Bedside Commode if appropriate  . MOD-Remain with patient during toileting  . MOD-Use of chair-pad alarm when appropriate     Problem: Safety  Goal: Patient will be free from injury during hospitalization  Flowsheets (Taken 03/25/2021 0412)  Patient will be free from injury during hospitalization:  . Assess patient's risk for falls and implement fall prevention plan of care per policy  . Provide and maintain safe environment  . Use appropriate transfer methods  . Ensure appropriate safety devices are available at the bedside  . Include patient/ family/ care giver in decisions related to safety  . Assess for patients risk for elopement and implement Elopement Risk Plan per policy  . Hourly rounding  . Provide alternative method of communication if needed (communication boards, writing)  Goal: Patient will be free from infection during hospitalization  Flowsheets (Taken 03/25/2021 0412)  Free from Infection during hospitalization:  . Assess and monitor for signs and symptoms of infection  . Monitor lab/diagnostic results  . Monitor all insertion sites (i.e. indwelling lines, tubes, urinary catheters, and drains)  . Encourage patient and family to use good hand hygiene  technique     Problem: Pain  Goal: Pain at adequate level as identified by patient  Flowsheets (Taken 03/25/2021 0412)  Pain at adequate level as identified by patient:  . Identify patient comfort function goal  . Assess for risk of opioid induced respiratory depression, including snoring/sleep apnea. Alert healthcare team of risk factors identified.  . Reassess pain within 30-60 minutes of any procedure/intervention, per Pain Assessment, Intervention, Reassessment (AIR) Cycle  . Assess pain on admission, during daily assessment and/or before any "as needed" intervention(s)  . Evaluate if patient comfort function goal is met  . Evaluate patient's satisfaction with pain management progress  . Offer non-pharmacological pain management interventions  . Consult/collaborate with Pain Service  . Include patient/patient care companion in decisions related to pain management as needed  . Consult/collaborate with Physical Therapy, Occupational Therapy, and/or Speech Therapy     Problem: Side Effects from Pain Analgesia  Goal: Patient will experience minimal side effects of analgesic therapy  Flowsheets (Taken 03/25/2021 0412)  Patient will experience minimal side effects of analgesic therapy:  . Monitor/assess patient's respiratory status (RR depth, effort, breath sounds)  . Assess for changes in cognitive function  . Prevent/manage side effects per LIP orders (i.e. nausea, vomiting, pruritus, constipation, urinary retention, etc.)  . Evaluate for opioid-induced sedation with appropriate assessment tool (i.e. POSS)     Problem: Discharge Barriers  Goal: Patient will be discharged home or other facility with appropriate resources  Flowsheets (Taken 03/25/2021 0412)  Discharge to home or other facility with appropriate resources:  . Provide appropriate patient education  .  Provide information on available health resources     Problem: Psychosocial and Spiritual Needs  Goal: Demonstrates ability to cope with  hospitalization/illness  Flowsheets (Taken 03/25/2021 0412)  Demonstrates ability to cope with hospitalizations/illness:  . Encourage verbalization of feelings/concerns/expectations  . Provide quiet environment  . Assist patient to identify own strengths and abilities  . Encourage patient to set small goals for self  . Encourage participation in diversional activity  . Reinforce positive adaptation of new coping behaviors  . Include patient/ patient care companion in decisions     Problem: Compromised Hemodynamic Status  Goal: Vital signs and fluid balance maintained/improved  Flowsheets (Taken 03/25/2021 0412)  Vital signs and fluid balance are maintained/improved:  Marland Kitchen Position patient for maximum circulation/cardiac output  . Monitor/assess vitals and hemodynamic parameters with position changes  . Monitor and compare daily weight  . Monitor intake and output. Notify LIP if urine output is less than 30 mL/hour.  . Monitor/assess lab values and report abnormal values     Problem: Inadequate Gas Exchange  Goal: Adequate oxygenation and improved ventilation  Flowsheets (Taken 03/25/2021 0412)  Adequate oxygenation and improved ventilation:  . Assess lung sounds  . Monitor SpO2 and treat as needed  . Monitor and treat ETCO2  . Provide mechanical and oxygen support to facilitate gas exchange  . Position for maximum ventilatory efficiency  . Plan activities to conserve energy: plan rest periods  . Consult/collaborate with Respiratory Therapy  . Increase activity as tolerated/progressive mobility  . Teach/reinforce use of incentive spirometer 10 times per hour while awake, cough and deep breath as needed

## 2021-03-25 NOTE — Respiratory Progress Note (Cosign Needed)
Respiratory Therapy                              Patient Re-Assessment Note/Protocol Order Changes    Patient has been assessed and re-evaluated for the follow therapies:       Respiratory Orders   (From admission, onward)             Start     Ordered    03/24/21 2151  Nebulizer treatment intermittent  Every 6 hours scheduled (RT)      Comments:   All Adult patients ordered for Respiratory Therapy, i.e., inhaled meds, secretion clearance/lung expansion or Oxygen greater than 5 liters/min will be evaluated by a Respiratory Therapist and assessed per Respiratory Therapy Patient Driven Protocol.  Initial assessment and changes made per protocol can be found in the progress note section of the patient chart.    03/24/21 2150    03/24/21 2100  CPAP continuous-not intubated  QHS (RT)      Question Answer Comment   PEEP 0    FLOW Start at: (Liters per minute) 0        03/24/21 1901    03/23/21 0800  Nebulizer treatment intermittent  Every 6 hours scheduled (RT)      Comments:   All Adult patients ordered for Respiratory Therapy, i.e., inhaled meds, secretion clearance/lung expansion or Oxygen greater than 5 liters/min will be evaluated by a Respiratory Therapist and assessed per Respiratory Therapy Patient Driven Protocol.  Initial assessment and changes made per protocol can be found in the progress note section of the patient chart.    03/23/21 0627    03/23/21 0800  Resp Re-Assess Adult (RT Use Only)  2 times daily (RT)       03/23/21 0638              IP Meds - Nasal and Inhaled (From admission, onward)            Start     Stop Status Route Frequency Ordered    03/25/21 0200  albuterol-ipratropium (DUO-NEB) 2.5-0.5(3) mg/3 mL nebulizer 3 mL         -- Dispensed NEBULIZATION RT - Every 6 hours scheduled 03/25/21 0144               Current Criteria For Therapy  Secretion Clearance: Home regimen  Lung Expansion: None indicated  Medications: Hx of COPD, Asthma,  Bronchitis or other documented RAD    Expected Outcomes  Secretion: Improved breath sound   Lung Expansion: Improved air movement at the bases   Meds: Return to baseline home regimen    Outcomes Met        Meds: No       Reassessment Recommendations  Recommendations: Decrease current treatment plan    Patient's orders have been modified as follows per RT Patient Driven Protocol.    Keep the current tx plan    If any questions, please contact the Respiratory Therapist assigned to this patient    Thank you

## 2021-03-25 NOTE — Progress Notes (Addendum)
PROGRESS NOTE    Date Time: 03/25/21 1:33 PM  Patient Name: [name], [name]      Subjective:   Doing fair, intermittently short of breath still on 2 [name] O2 saturation 95%  HIV nonreactive, RF negative, elevated ESR and C-reactive protein, respiratory pathogen panel negative ANA pending.    Medications:      Scheduled Meds: PRN Meds:    albuterol-ipratropium, 3 mL, Nebulization, BID  atomoxetine, 80 mg, Oral, Daily  azithromycin, 500 mg, Intravenous, Daily  cefTRIAXone, 1 g, Intravenous, Q24H  cetirizine, 10 mg, Oral, Daily  famotidine, 20 mg, Oral, BID  heparin (porcine), 5,000 Units, Subcutaneous, Q8H SCH  lidocaine, 1 patch, Transdermal, Q24H  methylPREDNISolone, 80 mg, Intravenous, TID  montelukast, 10 mg, Oral, QHS  nicotine, 1 patch, Transdermal, Daily  oxybutynin, 10 mg, Oral, TID  risperiDONE, 3 mg, Oral, BID  sertraline, 100 mg, Oral, BID          Continuous Infusions:   acetaminophen, 650 mg, Q6H PRN   Or  acetaminophen, 650 mg, Q6H PRN  ALPRAZolam, 0.5 mg, BID PRN  glucagon (rDNA), 1 mg, PRN   And  dextrose, 250 mL, PRN   And  dextrose, 25 g, PRN   And  dextrose, 25 g, PRN  gabapentin, 300 mg, TID PRN  guaiFENesin, 200 mg, Q4H PRN  melatonin, 3 mg, QHS PRN  naloxone, 0.2 mg, PRN  ondansetron, 4 mg, Q6H PRN   Or  ondansetron, 4 mg, Q6H PRN  oxyCODONE, 5 mg, Q6H PRN            Review of Systems:   A comprehensive review of systems was: General ROS:_0    negative other than :  Respiratory ROS:_1  cough,_2 shortness of breath,  _3  wheezing  Cardiovascular ROS:  _4  chest pain _5  dyspnea on exertion  Gastrointestinal ROS: _6  abdominal pain, _7 change in bowel habits,  _8 black/ bloody stools  Genito-Urinary ROS: _9  dysuria,_10  trouble voiding,_11  hematuria  Musculoskeletal ROS:_12  negative  Neurological ROS:_13  negative    Physical Exam:     Vitals:    03/25/21 1141   BP: 118/80   Pulse: 75   Resp: 14   Temp: 98.4 F (36.9 C)   SpO2: 93%       Intake and Output Summary (Last 24 hours) at Date Time    Intake/Output  Summary (Last 24 hours) at 03/25/2021 1333  Last data filed at 03/25/2021 0900  Gross per 24 hour   Intake 890 ml   Output 1600 ml   Net -710 ml       General appearance _14  alert, _15  mildly dyspneic appearing,  _16 no distress  Eyes -_17  pupils equal and_18 reactive, -_19   extraocular eye movements intact  Mouth -_20  mucous membranes moist, _21 pharynx normal without lesions  Neck -_22   supple,  _23 no adenopathy, _24 no JVD,_25 Thyromegaly.  Lymphatics -_26  no palpable lymphadenopathy  Chest - _27 clear to auscultation, _28  wheezes, _29 rales_30  rhonchi, _31 symmetric air entry  Heart - _32 normal rate, _33 regular rhythm, _34 normal S1, S2, _35  murmurs,_36  rubs, _37   gallops  Abdomen - _38 soft,_39  nontender,_40  nondistended,_41  no masses_42  organomegaly  Neurological - _43 alert,_44  oriented x3,_45  normal speech,_46  no focal findings or movement disorder noted  Musculoskeletal - _47  joint tenderness,_48  deformity _49 swelling  Extremities -_50  peripheral pulses normal,_51   pedal edema,_52  clubbing _53  cyanosis  Skin - _54 normal coloration and turgor, _55  rashes,_56   skin lesions noted                Labs:     Results     Procedure  Component Value Units Date/Time    HIV-1/2 Ag/Ab 4th Gen. w/ Reflex [[id number]] Collected: 03/25/21 0430     Updated: 03/25/21 1034     HIV Ag/Ab, 4th Generation Non-Reactive    Culture Blood Aerobic and Anaerobic [[id number]] Collected: 03/22/21 2349    Specimen: Arm from Blood, Venipuncture Updated: 03/25/21 0621    Narrative:      ORDER#: [id number]                                    ORDERED BY: HE, ALBERT  SOURCE: Blood, Venipuncture Arm                      COLLECTED:  03/22/21 23:49  ANTIBIOTICS AT COLL.:                                RECEIVED :  03/23/21 05:29  Culture Blood Aerobic and Anaerobic        PRELIM      03/25/21 06:21  03/24/21   No Growth after 1 day/s of incubation.  03/25/21   No Growth after 2 day/s of incubation.      Culture Blood Aerobic and Anaerobic [[id number]] Collected: 03/22/21 2349    Specimen: Arm from  Blood, Venipuncture Updated: 03/25/21 0621    Narrative:      ORDER#: [id number]                                    ORDERED BY: HE, ALBERT  SOURCE: Blood, Venipuncture Arm                      COLLECTED:  03/22/21 23:49  ANTIBIOTICS AT COLL.:                                RECEIVED :  03/23/21 05:29  Culture Blood Aerobic and Anaerobic        PRELIM      03/25/21 06:21  03/24/21   No Growth after 1 day/s of incubation.  03/25/21   No Growth after 2 day/s of incubation.      Basic Metabolic Panel [[id number]]  (Abnormal) Collected: 03/25/21 0430    Specimen: Blood Updated: 03/25/21 0532     Glucose 163 mg/dL      BUN 9.0 mg/dL      Creatinine 0.6 mg/dL      Calcium 9.0 mg/dL      Sodium 136 mEq/[name]      Potassium 5.4 mEq/[name]      Chloride 103 mEq/[name]      CO2 23 mEq/[name]      Anion Gap 10.0    GFR [[id number]] Collected: 03/25/21 0430     Updated: 03/25/21 0532     EGFR >60.0    Respiratory Pathogen Panel w/COVID-19, PCR [[id number]] Collected: 03/24/21 1307    Specimen: Nasopharyngeal Updated: 03/25/21 0512     Adenovirus Not Detected     Coronavirus 229E Not Detected     Coronavirus HKU1 Not Detected     Coronavirus NL63 Not Detected     Coronavirus OC43 Not Detected     SARS CoV-2 RNA Not Detected     Human Metapneumovirus Not Detected  Human Rhinovirus/Enterovirus Not Detected     Influenza A Not Detected     Influenza A/H1 Not Detected     Influenza AH1 - 2009 Not Detected     Influenza A/H3 Not Detected     Influenza B Not Detected     Parainfluenza Virus 1 Not Detected     Parainfluenza Virus 2 Not Detected     Parainfluenza Virus 3 Not Detected     Parainfluenza Virus 4 Not Detected     Respiratory Syncytial Virus Not Detected     Bordetella pertussis Not Detected     Bordetella parapertussis PCR Not Detected     Chlamydophila pneumoniae Not Detected     Mycoplasma pneumoniae Not Detected     Source NP Swab     Test Comment for Respiratory Panel with COVID See Below     Purpose of COVID testing Diagnostic -PUI     Narrative:      For NP, Call laboratory for VTM NP Swab collection device.  Bronchoalveolar Lavage is submitted in a sterile container.  Diagnostic -[facility]I    CBC without differential [[id number]]  (Abnormal) Collected: 03/25/21 0430    Specimen: Blood Updated: 03/25/21 0509     WBC 14.93 x10 3/uL      Hgb 11.2 g/dL      Hematocrit 35.8 %      Platelets 222 x10 3/uL      RBC 3.92 x10 6/uL      MCV 91.3 fL      MCH 28.6 pg      MCHC 31.3 g/dL      RDW 14 %      MPV 10.8 fL      Nucleated RBC 0.0 /100 WBC      Absolute NRBC 0.00 x10 3/uL     Blood gas, arterial [[id number]]  (Abnormal) Collected: 03/24/21 1314    Specimen: Blood, Arterial Updated: 03/24/21 1337     pH, Arterial 7.377     pCO2, Arterial 43.5 mmHg      pO2, Arterial 87.3 mmHg      HCO3, Arterial 25.0 mEq/[name]      Arterial Total CO2 50.5 mEq/[name]      Base Excess, Arterial 0.2 mEq/[name]      O2 Sat, Arterial 96.7 %      ABG CollectionSite Right Radl     Allen's Test Yes     Temperature 37.0     FIO2 21 %      Status Room Air     O2 Delivery Room Air          Rads:     Radiology Results (24 Hour)     ** No results found for the last 24 hours. 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    Hypoxic respiratory sufficiency: Nasal cannula 2 [name] titrate down as tolerated      Signed by: Myrene Buddy, MD, MD

## 2021-03-26 LAB — BASIC METABOLIC PANEL
Anion Gap: 9 (ref 5.0–15.0)
BUN: 13 mg/dL (ref 7.0–19.0)
CO2: 28 mEq/L (ref 22–29)
Calcium: 10.2 mg/dL (ref 8.5–10.5)
Chloride: 100 mEq/L (ref 100–111)
Creatinine: 0.6 mg/dL (ref 0.6–1.0)
Glucose: 150 mg/dL — ABNORMAL HIGH (ref 70–100)
Potassium: 4.2 mEq/L (ref 3.5–5.1)
Sodium: 137 mEq/L (ref 136–145)

## 2021-03-26 LAB — CBC
Absolute NRBC: 0 10*3/uL (ref 0.00–0.00)
Hematocrit: 37.5 % (ref 34.7–43.7)
Hgb: 12.2 g/dL (ref 11.4–14.8)
MCH: 29.5 pg (ref 25.1–33.5)
MCHC: 32.5 g/dL (ref 31.5–35.8)
MCV: 90.6 fL (ref 78.0–96.0)
MPV: 10.4 fL (ref 8.9–12.5)
Nucleated RBC: 0 /100 WBC (ref 0.0–0.0)
Platelets: 342 10*3/uL (ref 142–346)
RBC: 4.14 10*6/uL (ref 3.90–5.10)
RDW: 14 % (ref 11–15)
WBC: 14.01 10*3/uL — ABNORMAL HIGH (ref 3.10–9.50)

## 2021-03-26 LAB — GFR: EGFR: >60

## 2021-03-26 MED ORDER — INDOMETHACIN 25 MG PO CAPS
50.0000 mg | ORAL_CAPSULE | Freq: Two times a day (BID) | ORAL | Status: DC
Start: 2021-03-26 — End: 2021-03-26
  Filled 2021-03-26 (×2): qty 2

## 2021-03-26 MED ORDER — SERTRALINE HCL 50 MG PO TABS
100.0000 mg | ORAL_TABLET | Freq: Two times a day (BID) | ORAL | Status: DC
Start: 2021-03-26 — End: 2021-03-31
  Administered 2021-03-26 – 2021-03-31 (×10): 100 mg via ORAL
  Filled 2021-03-26 (×10): qty 2

## 2021-03-26 MED ORDER — INDOMETHACIN 25 MG PO CAPS
50.0000 mg | ORAL_CAPSULE | Freq: Two times a day (BID) | ORAL | Status: DC
Start: 2021-03-26 — End: 2021-03-31
  Administered 2021-03-26 – 2021-03-31 (×11): 50 mg via ORAL
  Filled 2021-03-26 (×11): qty 2

## 2021-03-26 MED ORDER — RISPERIDONE 1 MG PO TABS
3.0000 mg | ORAL_TABLET | Freq: Two times a day (BID) | ORAL | Status: DC
Start: 2021-03-26 — End: 2021-03-31
  Administered 2021-03-26 – 2021-03-31 (×9): 3 mg via ORAL
  Filled 2021-03-26 (×10): qty 3

## 2021-03-26 MED ORDER — FAMOTIDINE 20 MG PO TABS
20.0000 mg | ORAL_TABLET | Freq: Two times a day (BID) | ORAL | Status: DC
Start: 2021-03-26 — End: 2021-03-31
  Administered 2021-03-26 – 2021-03-31 (×10): 20 mg via ORAL
  Filled 2021-03-26 (×10): qty 1

## 2021-03-26 MED ORDER — METHYLPREDNISOLONE SODIUM SUCC 125 MG IJ SOLR
60.0000 mg | Freq: Three times a day (TID) | INTRAMUSCULAR | Status: DC
Start: 2021-03-26 — End: 2021-03-27
  Administered 2021-03-26 – 2021-03-27 (×2): 60 mg via INTRAVENOUS
  Filled 2021-03-26 (×3): qty 2

## 2021-03-26 NOTE — Plan of Care (Addendum)
Pt alert and oriented x 4. O2 sat 95-96% with O2 @ 2 l / min via n/c. Refused C-Pap stating she has claustrophobia. VSS. Afebrile. Oxy IR 10 mg for back pain, and Xanax for anxiety  given all  with positive effect. No adverse reaction. Voids adequate amount of clear urine. Continue current plan of care.  Problem: Safety  Goal: Patient will be free from injury during hospitalization  Outcome: Progressing  Flowsheets (Taken 03/26/2021 0346)  Patient will be free from injury during hospitalization:  . Assess patient's risk for falls and implement fall prevention plan of care per policy  . Provide and maintain safe environment  . Use appropriate transfer methods  . Ensure appropriate safety devices are available at the bedside  . Include patient/ family/ care giver in decisions related to safety  . Hourly rounding     Problem: Safety  Goal: Patient will be free from infection during hospitalization  Outcome: Progressing  Flowsheets (Taken 03/26/2021 0346)  Free from Infection during hospitalization:  . Assess and monitor for signs and symptoms of infection  . Monitor lab/diagnostic results  . Monitor all insertion sites (i.e. indwelling lines, tubes, urinary catheters, and drains)  . Encourage patient and family to use good hand hygiene technique     Problem: Pain  Goal: Pain at adequate level as identified by patient  Outcome: Progressing  Flowsheets (Taken 03/26/2021 0346)  Pain at adequate level as identified by patient:  . Identify patient comfort function goal  . Assess for risk of opioid induced respiratory depression, including snoring/sleep apnea. Alert healthcare team of risk factors identified.  . Assess pain on admission, during daily assessment and/or before any "as needed" intervention(s)  . Reassess pain within 30-60 minutes of any procedure/intervention, per Pain Assessment, Intervention, Reassessment (AIR) Cycle  . Evaluate if patient comfort function goal is met  . Evaluate patient's satisfaction with pain  management progress  . Offer non-pharmacological pain management interventions     Problem: Side Effects from Pain Analgesia  Goal: Patient will experience minimal side effects of analgesic therapy  Outcome: Progressing  Flowsheets (Taken 03/26/2021 0346)  Patient will experience minimal side effects of analgesic therapy:  . Monitor/assess patient's respiratory status (RR depth, effort, breath sounds)  . Assess for changes in cognitive function  . Prevent/manage side effects per LIP orders (i.e. nausea, vomiting, pruritus, constipation, urinary retention, etc.)  . Evaluate for opioid-induced sedation with appropriate assessment tool (i.e. POSS)     Problem: Compromised Hemodynamic Status  Goal: Vital signs and fluid balance maintained/improved  Outcome: Progressing  Flowsheets (Taken 03/26/2021 0346)  Vital signs and fluid balance are maintained/improved:  Marland Kitchen Position patient for maximum circulation/cardiac output  . Monitor/assess vitals and hemodynamic parameters with position changes  . Monitor and compare daily weight  . Monitor intake and output. Notify LIP if urine output is less than 30 mL/hour.  . Monitor/assess lab values and report abnormal values     Problem: Inadequate Gas Exchange  Goal: Adequate oxygenation and improved ventilation  Outcome: Progressing  Flowsheets (Taken 03/26/2021 0346)  Adequate oxygenation and improved ventilation:  . Assess lung sounds  . Monitor SpO2 and treat as needed  . Provide mechanical and oxygen support to facilitate gas exchange  . Position for maximum ventilatory efficiency  . Teach/reinforce use of incentive spirometer 10 times per hour while awake, cough and deep breath as needed  . Plan activities to conserve energy: plan rest periods  . Increase activity as tolerated/progressive mobility  .  Consult/collaborate with Respiratory Therapy   Pt a

## 2021-03-26 NOTE — Plan of Care (Signed)
Patient was alert and oriented during the shift. Complained of back pain. Roxicodone given PRN with relief. Sat in the chair for meals. On 2 L via NC with Sats of 95%. Able to use call bell light. Safe environment provided. Purposeful rounding done.     Problem: Moderate/High Fall Risk Score >5  Goal: Patient will remain free of falls  Outcome: Progressing  Flowsheets (Taken 03/25/2021 2045 by Junie Bame, RN)  Moderate Risk (6-13):  Marland Kitchen MOD-Consider activation of bed alarm if appropriate  . MOD-Apply bed exit alarm if patient is confused  . MOD-Consider a move closer to [employer]  . MOD-Floor mat at bedside (where available) if appropriate  . MOD-Remain with patient during toileting     Problem: Safety  Goal: Patient will be free from injury during hospitalization  Outcome: Progressing  Flowsheets (Taken 03/26/2021 0346 by Junie Bame, RN)  Patient will be free from injury during hospitalization:  . Assess patient's risk for falls and implement fall prevention plan of care per policy  . Provide and maintain safe environment  . Use appropriate transfer methods  . Ensure appropriate safety devices are available at the bedside  . Include patient/ family/ care giver in decisions related to safety  . Hourly rounding  Goal: Patient will be free from infection during hospitalization  Outcome: Progressing  Flowsheets (Taken 03/26/2021 0346 by Junie Bame, RN)  Free from Infection during hospitalization:  . Assess and monitor for signs and symptoms of infection  . Monitor lab/diagnostic results  . Monitor all insertion sites (i.e. indwelling lines, tubes, urinary catheters, and drains)  . Encourage patient and family to use good hand hygiene technique     Problem: Pain  Goal: Pain at adequate level as identified by patient  Outcome: Progressing  Flowsheets (Taken 03/26/2021 0346 by Junie Bame, RN)  Pain at adequate level as identified by patient:  . Identify patient comfort function goal  . Assess  for risk of opioid induced respiratory depression, including snoring/sleep apnea. Alert healthcare team of risk factors identified.  . Assess pain on admission, during daily assessment and/or before any "as needed" intervention(s)  . Reassess pain within 30-60 minutes of any procedure/intervention, per Pain Assessment, Intervention, Reassessment (AIR) Cycle  . Evaluate if patient comfort function goal is met  . Evaluate patient's satisfaction with pain management progress  . Offer non-pharmacological pain management interventions     Problem: Side Effects from Pain Analgesia  Goal: Patient will experience minimal side effects of analgesic therapy  Outcome: Progressing  Flowsheets (Taken 03/26/2021 0346 by Junie Bame, RN)  Patient will experience minimal side effects of analgesic therapy:  . Monitor/assess patient's respiratory status (RR depth, effort, breath sounds)  . Assess for changes in cognitive function  . Prevent/manage side effects per LIP orders (i.e. nausea, vomiting, pruritus, constipation, urinary retention, etc.)  . Evaluate for opioid-induced sedation with appropriate assessment tool (i.e. POSS)     Problem: Compromised Hemodynamic Status  Goal: Vital signs and fluid balance maintained/improved  Outcome: Progressing  Flowsheets (Taken 03/26/2021 0346 by Junie Bame, RN)  Vital signs and fluid balance are maintained/improved:  Marland Kitchen Position patient for maximum circulation/cardiac output  . Monitor/assess vitals and hemodynamic parameters with position changes  . Monitor and compare daily weight  . Monitor intake and output. Notify LIP if urine output is less than 30 mL/hour.  . Monitor/assess lab values and report abnormal values     Problem: Inadequate Gas Exchange  Goal: Adequate oxygenation and improved ventilation  Outcome: Progressing  Flowsheets (Taken 03/26/2021 0346 by Junie Bame, RN)  Adequate oxygenation and improved ventilation:  . Assess lung sounds  . Monitor SpO2 and treat  as needed  . Provide mechanical and oxygen support to facilitate gas exchange  . Position for maximum ventilatory efficiency  . Teach/reinforce use of incentive spirometer 10 times per hour while awake, cough and deep breath as needed  . Plan activities to conserve energy: plan rest periods  . Increase activity as tolerated/progressive mobility  . Consult/collaborate with Respiratory Therapy

## 2021-03-26 NOTE — Plan of Care (Signed)
Nursing Notes:  Pt is A&Ox4, VSS, and on 2L NC. Fall and safety precautions in place. Pt denies shortness of breath. Xanax given for sleep and anxiety. Pt c/o back pain managed by Roxicodone and tylenol. Updated plan of care. Rounding done and call bell within reach.      Problem: Moderate/High Fall Risk Score >5  Goal: Patient will remain free of falls  Outcome: Progressing  Flowsheets (Taken 03/26/2021 2000)  Moderate Risk (6-13):  Marland Kitchen LOW-Fall Interventions Appropriate for Low Fall Risk  . LOW-Anticoagulation education for injury risk  . MOD-Consider activation of bed alarm if appropriate  . MOD-Apply bed exit alarm if patient is confused  . MOD-Floor mat at bedside (where available) if appropriate  . MOD-Consider a move closer to [employer]  . MOD-Remain with patient during toileting  . MOD-Perform dangle, stand, walk (DSW) prior to mobilization  . MOD-include family in multidisciplinary POC discussions     Problem: Safety  Goal: Patient will be free from injury during hospitalization  Outcome: Progressing  Flowsheets (Taken 03/26/2021 2050)  Patient will be free from injury during hospitalization:  . Assess patient's risk for falls and implement fall prevention plan of care per policy  . Use appropriate transfer methods  . Ensure appropriate safety devices are available at the bedside  . Provide and maintain safe environment  . Hourly rounding  . Assess for patients risk for elopement and implement Elopement Risk Plan per policy  . Include patient/ family/ care giver in decisions related to safety  . Provide alternative method of communication if needed (communication boards, writing)  Goal: Patient will be free from infection during hospitalization  Outcome: Progressing  Flowsheets (Taken 03/26/2021 2050)  Free from Infection during hospitalization:  . Assess and monitor for signs and symptoms of infection  . Monitor lab/diagnostic results  . Monitor all insertion sites (i.e. indwelling lines, tubes, urinary  catheters, and drains)  . Encourage patient and family to use good hand hygiene technique     Problem: Pain  Goal: Pain at adequate level as identified by patient  Outcome: Progressing  Flowsheets (Taken 03/26/2021 2050)  Pain at adequate level as identified by patient:  . Identify patient comfort function goal  . Assess for risk of opioid induced respiratory depression, including snoring/sleep apnea. Alert healthcare team of risk factors identified.  . Assess pain on admission, during daily assessment and/or before any "as needed" intervention(s)  . Reassess pain within 30-60 minutes of any procedure/intervention, per Pain Assessment, Intervention, Reassessment (AIR) Cycle  . Evaluate if patient comfort function goal is met  . Evaluate patient's satisfaction with pain management progress  . Offer non-pharmacological pain management interventions  . Include patient/patient care companion in decisions related to pain management as needed     Problem: Compromised Hemodynamic Status  Goal: Vital signs and fluid balance maintained/improved  Outcome: Progressing  Flowsheets (Taken 03/26/2021 2050)  Vital signs and fluid balance are maintained/improved:  Marland Kitchen Position patient for maximum circulation/cardiac output  . Monitor/assess vitals and hemodynamic parameters with position changes  . Monitor intake and output. Notify LIP if urine output is less than 30 mL/hour.  . Monitor/assess lab values and report abnormal values  . Monitor and compare daily weight     Problem: Inadequate Gas Exchange  Goal: Adequate oxygenation and improved ventilation  Outcome: Progressing  Flowsheets (Taken 03/26/2021 2050)  Adequate oxygenation and improved ventilation:  . Assess lung sounds  . Monitor SpO2 and treat as needed  . Provide  mechanical and oxygen support to facilitate gas exchange  . Plan activities to conserve energy: plan rest periods  . Increase activity as tolerated/progressive mobility  . Teach/reinforce use of incentive spirometer  10 times per hour while awake, cough and deep breath as needed  . Consult/collaborate with Respiratory Therapy     Problem: Compromised Tissue integrity  Goal: Damaged tissue is healing and protected  Outcome: Progressing  Flowsheets (Taken 03/26/2021 2050)  Damaged tissue is healing and protected:  Marland Kitchen Monitor/assess Braden scale every shift  . Provide wound care per wound care algorithm  . Reposition patient every 2 hours and as needed unless able to reposition self  . Increase activity as tolerated/progressive mobility  . Avoid shearing injuries  . Relieve pressure to bony prominences for patients at moderate and high risk  . Use bath wipes, not soap and water, for daily bathing  . Use incontinence wipes for cleaning urine, stool and caustic drainage. Foley care as needed  . Keep intact skin clean and dry  . Monitor external devices/tubes for correct placement to prevent pressure, friction and shearing  . Monitor patient's hygiene practices  Goal: Nutritional status is improving  Outcome: Progressing  Flowsheets (Taken 03/26/2021 2050)  Nutritional status is improving:  . Allow adequate time for meals  . Encourage patient to take dietary supplement(s) as ordered  . Collaborate with Clinical Nutritionist  . Include patient/patient care companion in decisions related to nutrition

## 2021-03-26 NOTE — Progress Notes (Signed)
[facility]  PROGRESS NOTE      Patient: [name]  Date: 03/26/2021   LOS: 4 Days  Admission Date: 03/22/2021   MRN: [id number]  Attending: Dr. Romana Juniper Blair Hailey, PA-C  Please contact me on secure chat or the following Spectralink [phone]       ASSESSMENT/PLAN     [name] is a 50 y.o. female with a PMHx of tobacco dependence, chronic respiratory failure secondary to COPD (O2 dependent on 2 L via NC), history of psychosis, anxiety/depression, OSA (not using CPAP), morbid obesity, and chronic back pain admitted with acute COPD exacerbation, interstitial pneumonitis.      Interval Summary:     Patient somnolent this morning, refused CPAP overnight. Improvement in respiratory status this afternoon, patient sitting up in chair. Reports ongoing chest tightness and SOB that has improved since admission. Will decrease Solumedrol 60 mg IV q8h today.    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  Enlarged main pulmonary trunk suggestive of underlying pulmonary arterial hypertension. CTA chest without definite evidence of PE, again notes bilateral ground glass infiltrates.  -Solumedrol 60 mg every 8 hours  -Titrate oxygen to maintain O2 saturations 88 to 94%  -Ceftazidime adjusted 5/5 azithromycin + ceftriaxone  -Continue DuoNebs every 6 hours  -Antitussives as needed  -BNP and serial troponins wnl, Echocardiogram EF 60-65%, mild TR, moderate pulm htn  -ESR and CRP elevated, ANA and Rh negative. HIV non-reactive.  -Sputum culture contaminated, RVP negative  -Appreciate pulmonology evaluation, likely nonspecific interstitial pneumonitis. Possible bronchoscopy  Monday if remains symptomatic  -Continue educating on the importance of smoking cessation, patient is motivated.   -PT/OT recommending home with home health    Hyperkalemia  -Improved with kayexalate, renal diet. Will liberalize back to regular diet at patient request.  -Repeat BMP in AM    Altered mental status, resolved  Hypercarbia   -ABG with stable pH, pCO2 50.5.     Chronic back pain  -Continue home indomethacin. Continue gabapentin and oxycodone at tapered doses    History of OSA  -Will start CPAP this admission, patient refused overnight  -Outpatient sleep study on discharge, patient reports insurance refusing to replace CPAP    Morbid obesity  -Lifestyle modification with diet and exercise on discharge    Anxiety/depression  -Continue home Xanax, risperidone, and sertraline. Atomoxetine non-formulary, resume on discharge    Tobacco dependence  -Cessation advised, continue nicotine patch. Will need prescription on discharge    GERD  -Pepcid     Dysuria  -Trace LE, continue abx as above      Analgesia: Tylenol, oxycodone, gabapentin    Nutrition: regular    DVT Prophylaxis:Heparin       Code Status: Full    DISPO: Pending improvement in respiratory symptoms. Likely 1-2 days.       SUBJECTIVE     [name] reports chest tightness this afternoon, heat pack to chest. SOB slightly improved from yesterday. No fever, chills, N/V/D.    MEDICATIONS     Current Facility-Administered Medications   Medication Dose Route Frequency   . albuterol-ipratropium  3 mL Nebulization BID   . azithromycin  500 mg Intravenous Daily   . cefTRIAXone  1  g Intravenous Q24H   . cetirizine  10 mg Oral Daily   . famotidine  20 mg Oral BID   . heparin (porcine)  5,000 Units Subcutaneous Q8H [place]   . lidocaine  1 patch Transdermal Q24H   . methylPREDNISolone  80 mg Intravenous TID   . montelukast  10 mg Oral QHS   . nicotine  1 patch Transdermal Daily   . oxybutynin  10 mg Oral TID   . risperiDONE  3 mg Oral BID   . sertraline  100  mg Oral BID       PHYSICAL EXAM     Vitals:    03/26/21 1158   BP: 128/87   Pulse: 63   Resp: 18   Temp: 97.7 F (36.5 C)   SpO2: 97%       Temperature: Temp  Min: 97.7 F (36.5 C)  Max: 98.6 F (37 C)  Pulse: Pulse  Min: 63  Max: 81  Respiratory: Resp  Min: 18  Max: 20  Non-Invasive BP: BP  Min: 104/65  Max: 135/76  Pulse Oximetry SpO2  Min: 93 %  Max: 97 %    Intake and Output Summary (Last 24 hours) at Date Time    Intake/Output Summary (Last 24 hours) at 03/26/2021 1423  Last data filed at 03/26/2021 0322  Gross per 24 hour   Intake 660 ml   Output 1700 ml   Net -1040 ml       GEN APPEARANCE: NAD, A&Ox3  HEENT: PERLA; EOMI; Conjunctiva Clear  NECK: Supple; No bruits  CVS: RRR, S1, S2; No M/G/R  LUNGS: Coarse breath sounds bilaterally, expiratory wheezing  ABD: Obese abdomen, Soft; No TTP; + Normoactive BS  EXT: No edema; Pulses 2+ and intact  Skin exam:  no pallor  NEURO: CN 2-12 intact; No Focal neurological deficits  CAP REFILL:  Normal  MENTAL STATUS:  Altered    Exam done by Edwin Dada, PA on 03/26/21 at 2:23 PM      LABS     Recent Labs   Lab 03/26/21  0404 03/25/21  0430 03/24/21  0805   WBC 14.01* 14.93* 11.07*   RBC 4.14 3.92 4.09   Hgb 12.2 11.2* 12.1   Hematocrit 37.5 35.8 37.8   MCV 90.6 91.3 92.4   Platelets 342 222 277       Recent Labs   Lab 03/26/21  0404 03/25/21  0430 03/24/21  0805 03/23/21  0306 03/22/21  1949   Sodium 137 136 142 138 137   Potassium 4.2 5.4* 5.2* 5.0 4.5   Chloride 100 103 110 105 105   CO2 _0 20* 23   BUN 13.0 9.0 9.0 8.0 9.0   Creatinine 0.6 0.6 0.6 0.6 0.6   Glucose 150* 163* 139* 161* 114*   Calcium 10.2 9.0 10.1 9.1 9.2       Recent Labs   Lab 03/22/21  1949   ALT 10   AST (SGOT) 13   Bilirubin, Total 0.5   Albumin 2.9*   Alkaline Phosphatase 96       Recent Labs   Lab 03/23/21  1704 03/23/21  0306 03/22/21  1949   Troponin I <0.01 <0.01 <0.01       Recent Labs   Lab 03/23/21  0306   PT INR 0.9   PT 10.2       Microbiology Results (last 15 days)     Procedure  Component Value Units Date/Time  Respiratory Pathogen Panel w/COVID-19, PCR [[id number]] Collected: 03/24/21 1307    Order Status: Completed Specimen: Nasopharyngeal Updated: 03/25/21 0512     Adenovirus Not Detected     Coronavirus 229E Not Detected     Coronavirus HKU1 Not Detected     Coronavirus NL63 Not Detected     Coronavirus OC43 Not Detected     SARS CoV-2 RNA Not Detected     Human Metapneumovirus Not Detected     Human Rhinovirus/Enterovirus Not Detected     Influenza A Not Detected     Influenza A/H1 Not Detected     Influenza AH1 - 2009 Not Detected     Influenza A/H3 Not Detected     Influenza B Not Detected     Parainfluenza Virus 1 Not Detected     Parainfluenza Virus 2 Not Detected     Parainfluenza Virus 3 Not Detected     Parainfluenza Virus 4 Not Detected     Respiratory Syncytial Virus Not Detected     Bordetella pertussis Not Detected     Bordetella parapertussis PCR Not Detected     Chlamydophila pneumoniae Not Detected     Mycoplasma pneumoniae Not Detected     Source NP Swab     Test Comment for Respiratory Panel with COVID See Below     Comment: Testing performed using the [employer] Film[employer]  Respiratory Panel (RP 2.1)  This test is for the qualitative detection of  respiratory pathogen nucleic acid. This assay cannot  differentiate between Rhinovirus/Enterovirus. If  necessary for patient care, a positive result for  Rhinovirus/Enterovirus may be followed-up using an  alternate method. This assay should not be used if  B. pertussis infection is specifically suspected as it  is less sensitive than other alternatives. If suspected,  ensure that B. pertussis specific testing is ordered.  Recent administration of a nasal influenza vaccine may  cause false positive results for Influenza A and/or  Influenza B.  Viral and bacterial nucleic acids may persist even  though no viable organism is present. Detection of  nucleic acid does not imply that the corresponding  organisms are infectious or are  the causative agents of  clinical symptoms. Positive results of this test do not  rule out coinfection with other organisms. A negative  result does not exclude the possibility of viral or  bacterial infection.  Assay performance characteristics  may vary with circulating strains and this assay may not  be able to distinguish between existing viral strains  and new variants as they emerge. Results of this test  should not be used as the sole basis for diagnosis,  treatment, or other patient management decisions.  This assay is FDA authorized for nasopharyngeal swab  samples.  Performance characteristics for  Bronchoalveolar lavage samples have been determined by  the [facility] laboratory. Other sample types are unacceptable.  Invalid results may be due to inhibiting substances in  the specimen and recollection should occur.          Purpose of COVID testing Diagnostic -[facility]I    Narrative:      For NP, Call laboratory for VTM NP Swab collection device.  Bronchoalveolar Lavage is submitted in a sterile container.  Diagnostic -PUI    CULTURE + Lacretia Leigh [[id number]] Collected: 03/23/21 2208    Order Status: Completed Specimen: Sputum, Expectorated Updated: 03/24/21 0620    Narrative:      Results Faxed [phone], by [id number] on 03/24/2021 at 06:20  Culture and Gram Stain,  Aerobic, Respiratory was cancelled on 03/24/21 at  06:20 by [phone]; Specimen quality is inadequate for culture  Specimen appears  to be saliva  Please submit another sputum specimen  Induction of sputum may  improve specimen quality  ORDER#: [id number]                                    ORDERED BY: JABBAR, LUBNA  SOURCE: Sputum, Expectorated as below                COLLECTED:  03/23/21 22:08  ANTIBIOTICS AT COLL.:                                RECEIVED :  03/24/21 01:47  Results Faxed [phone], by [id number] on 03/24/2021 at 06:20  Culture and Gram Stain, Aerobic, Respiratory was cancelled on 03/24/21 at 06:20 by [id number]; Specimen  quality is inadequate for  culture  Specimen appears to be saliva  Please submit another sputum specimen  Induction of sputum may improve specimen quality  Stain, Gram (Respiratory)                  FINAL       03/24/21 06:20  03/24/21   Smear contains > or = 10 squamous epithelial cells per low             power field, suggestive of significant oral contamination and             poor quality specimen. Culture not performed.             Please recollect if clinically indicated.  Culture and Gram Stain, Aerobic, RespiratorCANCELLED   03/24/21 06:20            - cancelled on 03/24/21 06:20   by  [id number]   Specimen quality is inadequate for culture  Specimen appears to be saliva   Please submit another sputum specimen  Induction of sputum may improve specimen   quality      Culture Blood Aerobic and Anaerobic [[id number]] Collected: 03/22/21 2349    Order Status: Completed Specimen: Arm from Blood, Venipuncture Updated: 03/26/21 0621    Narrative:      ORDER#: [id number]                                    ORDERED BY: HE, ALBERT  SOURCE: Blood, Venipuncture Arm                      COLLECTED:  03/22/21 23:49  ANTIBIOTICS AT COLL.:                                RECEIVED :  03/23/21 05:29  Culture Blood Aerobic and Anaerobic        PRELIM      03/26/21 06:21  03/24/21   No Growth after 1 day/s of incubation.  03/25/21   No Growth after 2 day/s of incubation.  03/26/21   No Growth after 3 day/s of incubation.      Culture Blood Aerobic and Anaerobic [[id number]] Collected: 03/22/21 2349    Order Status: Completed Specimen: Arm from Blood, Venipuncture Updated: 03/26/21 9570    Narrative:  ORDER#: [id number]                                    ORDERED BY: HE, ALBERT  SOURCE: Blood, Venipuncture Arm                      COLLECTED:  03/22/21 23:49  ANTIBIOTICS AT COLL.:                                RECEIVED :  03/23/21 05:29  Culture Blood Aerobic and Anaerobic        PRELIM      03/26/21 06:21  03/24/21   No Growth after 1  day/s of incubation.  03/25/21   No Growth after 2 day/s of incubation.  03/26/21   No Growth after 3 day/s of incubation.      COVID-19 (SARS-CoV-2) and Influenza A/B, NAA (Liat Rapid)- Admission [[id number]] Collected: 03/22/21 2227    Order Status: Completed Specimen: Culturette from Nasopharyngeal Updated: 03/22/21 2304     Purpose of COVID testing Diagnostic -PUI     SARS-CoV-2 Specimen Source Nasal Swab     SARS CoV 2 Overall Result Not Detected     Comment: __________________________________________________  -A result of "Detected" indicates POSITIVE for the    presence of SARS CoV-2 RNA  -A result of "Not Detected" indicates NEGATIVE for the    presence of SARS CoV-2 RNA  __________________________________________________________  Test performed using the Roche cobas Liat SARS-CoV-2 assay. This assay is  only for use under the Food and Drug Administration's Emergency Use  Authorization. This is a real-time RT-PCR assay for the qualitative  detection of SARS-CoV-2 RNA. Viral nucleic acids may persist in vivo,  independent of viability. Detection of viral nucleic acid does not imply the  presence of infectious virus, or that virus nucleic acid is the cause of  clinical symptoms. Negative results do not preclude SARS-CoV-2 infection and  should not be used as the sole basis for diagnosis, treatment or other  patient management decisions. Negative results must be combined with  clinical observations, patient history, and/or epidemiological information.  Invalid results may be due to inhibiting substances in the specimen and  recollection should occur. Please see Fact Sheets for patients and providers  located:  https://www.benson-chung.com/          Influenza A Not Detected     Influenza B Not Detected     Comment: Test performed using the Roche cobas Liat SARS-CoV-2 & Influenza A/B assay.  This assay is only for use under the Food and Drug Administration's  Emergency Use Authorization. This is a  multiplex real-time RT-PCR assay  intended for the simultaneous in vitro qualitative detection and  differentiation of SARS-CoV-2, influenza A, and influenza B virus RNA. Viral  nucleic acids may persist in vivo, independent of viability. Detection of  viral nucleic acid does not imply the presence of infectious virus, or that  virus nucleic acid is the cause of clinical symptoms. Negative results do  not preclude SARS-CoV-2, influenza A, and/or influenza B infection and  should not be used as the sole basis for diagnosis, treatment or other  patient management decisions. Negative results must be combined with  clinical observations, patient history, and/or epidemiological information.  Invalid results may be due to inhibiting substances in the specimen and  recollection should  occur. Please see Fact Sheets for patients and providers  located: http://olson-hall.info/.         Narrative:      o Collect and clearly label specimen type:  o PREFERRED-Upper respiratory specimen: One Nasal Swab in  Transport Media.  o Hand deliver to laboratory ASAP  Diagnostic -PUI           RADIOLOGY     Upon my review:    Signed,  Edwin Dada, PA  2:23 PM 03/26/2021

## 2021-03-26 NOTE — Progress Notes (Signed)
[facility] Attending Physician   Progress Note                                                                                                                  Patient Name: [name], [name] 50 y.o. female admitted with SOB (shortness of breath)  Admit Date: 03/22/2021    Patient status: Inpatient        Active Issues:      Problem List:       Patient Active Problem List    Diagnosis Date Noted   . *HSOB (shortness of breath) [R06.02] 03/22/2021   . Acute bronchospasm due to viral infection [J98.01, B34.9] 10/07/2020   . Chest tightness [R07.89] 11/25/2019   . PNA (pneumonia) [J18.9] 02/01/2019   . Multifocal pneumonia [J18.9] 01/11/2019   . COPD with acute exacerbation [J44.1] 01/11/2019   . CAD (coronary artery disease) [I25.10] 01/11/2019   . OSA (obstructive sleep apnea) [G47.33] 01/11/2019   . Anxiety [F41.9] 01/11/2019   . Overactive bladder [N32.81] 01/11/2019           Assessment & Plan :       ***            Discussed with nursing staff.      Subjective:         Fever {YES/NO:21936}    Eating {YES/NO:21936}   Cough {YES/NO:21936}   Diarrhea {YES/NO:21936}   Sleep {YES/NO:21936}   Voiding {YES/NO:21936}           Review of Systems:         Review of systems    A 12 point comprehensive review of system was performed and negative except for positives mentioned elsewhere in this note ***    General ROS:_0    negative other than : _1  chills, _2  fatigue, _3  fever  . Respiratory ROS:_4  cough,_5 shortness of breath,  _6  wheezing  . Cardiovascular ROS:  _7  chest pain _8  dyspnea on exertion, _9 palpitation  . Gastrointestinal ROS: _10  abdominal pain, _11 change in bowel habits,  _12 black/ bloody stools  ,_13  No nausea vomiting   . Genito-Urinary ROS: _14  dysuria,_15  trouble voiding,_16  hematuria  . Musculoskeletal ROS:_17  negative  . Neurological ROS:_18  negative , _19 confusion, _20 dizziness, _21 headaches, _22 speech problems, _23  weakness  . ENT : _24  headaches,   _25  vertigo  . Ophthalmic : _26   double vision,  _27    photophobia  . Dermatological:  _28  significant skin rash        Physical Exam:         Physical Exam:    General :      in no significant distress     HENT:     Nose: no nasal discharge      Ears - no external ear lesions seen     Mouth - clear oral mucosa, moist      Neck -  no visible thyromegaly.     Cardiovascular:          Normal S1, S2, no murmurs, no significant JVD elevation,  Pulmonary/Chest Wall:           CTA B/l ***,  symmetric air entry ,        no use of accessory muscles of respiration ***         no wheezes***       Coarse BS B/l ***       bibasilar crackles ***        minimal end expiratory wheeze ***          Abdominal:            soft, nontender, BS +,  nondistended,       no significant palpable masses     Extremities         No *** pedal edema, no visible cyanosis,         peripheral pulses palpable bilaterally     Neurological:               no focal findings or movement disorder noted      alert, oriented to person, place,time      follows commands     Eyes     PERRL,  sclera non-discolored     Capillary refill time less than 3 seconds                    Labs:   Reviewed in EMR      Rads:   Reviewed in EMR                Services included the following: chart data review, reviewing nursing notes and/or old charts, documentation time, consultant collaboration regarding findings and treatment options, medication orders and management, communication with RNs, direct patient care, re-evaluations, vital sign assessments and ordering, interpreting, reviewing diagnostic studies/lab tests.      Alfredia Ferguson MD, MSPH          This note was generated by the Epic EMR system/ Dragon speech recognition and may contain inherent errors or omissions not intended by the user. Grammatical errors, random word insertions, deletions, pronoun errors and incomplete sentences are occasional consequences of this technology due to software limitations. Not all errors are caught or corrected. If there are  questions or concerns about the content of this note or information contained within the body of this dictation they should be addressed directly with the author for clarification.

## 2021-03-27 ENCOUNTER — Inpatient Hospital Stay: Payer: Medicaid Other

## 2021-03-27 LAB — CBC
Absolute NRBC: 0 10*3/uL (ref 0.00–0.00)
Hematocrit: 37.6 % (ref 34.7–43.7)
Hgb: 12.2 g/dL (ref 11.4–14.8)
MCH: 29.2 pg (ref 25.1–33.5)
MCHC: 32.4 g/dL (ref 31.5–35.8)
MCV: 90 fL (ref 78.0–96.0)
MPV: 10 fL (ref 8.9–12.5)
Nucleated RBC: 0 /100 WBC (ref 0.0–0.0)
Platelets: 339 10*3/uL (ref 142–346)
RBC: 4.18 10*6/uL (ref 3.90–5.10)
RDW: 14 % (ref 11–15)
WBC: 12.34 10*3/uL — ABNORMAL HIGH (ref 3.10–9.50)

## 2021-03-27 LAB — BASIC METABOLIC PANEL
Anion Gap: 7 (ref 5.0–15.0)
BUN: 17 mg/dL (ref 7.0–19.0)
CO2: 27 mEq/L (ref 22–29)
Calcium: 10.3 mg/dL (ref 8.5–10.5)
Chloride: 102 mEq/L (ref 100–111)
Creatinine: 0.6 mg/dL (ref 0.6–1.0)
Glucose: 150 mg/dL — ABNORMAL HIGH (ref 70–100)
Potassium: 4.7 mEq/L (ref 3.5–5.1)
Sodium: 136 mEq/L (ref 136–145)

## 2021-03-27 LAB — GFR: EGFR: >60

## 2021-03-27 MED ORDER — OXYCODONE-ACETAMINOPHEN 5-325 MG PO TABS
1.0000 | ORAL_TABLET | Freq: Once | ORAL | Status: AC
Start: 2021-03-28 — End: 2021-03-28
  Administered 2021-03-28: 1 via ORAL
  Filled 2021-03-27: qty 1

## 2021-03-27 MED ORDER — OXYCODONE HCL 5 MG PO TABS
5.0000 mg | ORAL_TABLET | Freq: Four times a day (QID) | ORAL | Status: DC | PRN
  Administered 2021-03-27 – 2021-03-28 (×3): 5 mg via ORAL
  Filled 2021-03-27 (×3): qty 1

## 2021-03-27 MED ORDER — METHYLPREDNISOLONE SODIUM SUCC 40 MG IJ SOLR
40.0000 mg | Freq: Three times a day (TID) | INTRAMUSCULAR | Status: DC
Start: 2021-03-27 — End: 2021-03-28
  Administered 2021-03-27 – 2021-03-28 (×2): 40 mg via INTRAVENOUS
  Filled 2021-03-27 (×2): qty 1

## 2021-03-27 NOTE — Plan of Care (Signed)
Patient was alert and oriented during the shift. Complained of generalized pain. Roxycodone given PRN with relief. Sat in chair for meals. Able to use call bell light. Good appetite. Safe environment provided. Purposeful rounding done.    Problem: Moderate/High Fall Risk Score >5  Goal: Patient will remain free of falls  Outcome: Progressing  Flowsheets (Taken 03/27/2021 0800)  High (Greater than 13):   HIGH-Consider use of low bed   HIGH-Initiate use of floor mats as appropriate     Problem: Safety  Goal: Patient will be free from injury during hospitalization  Outcome: Progressing  Flowsheets (Taken 03/26/2021 2050 by Martinique, Caitlin, RN)  Patient will be free from injury during hospitalization:   Assess patient's risk for falls and implement fall prevention plan of care per policy   Use appropriate transfer methods   Ensure appropriate safety devices are available at the bedside   Provide and maintain safe environment   Hourly rounding   Assess for patients risk for elopement and implement [place] per policy   Include patient/ family/ care giver in decisions related to safety   Provide alternative method of communication if needed (communication boards, writing)  Goal: Patient will be free from infection during hospitalization  Outcome: Progressing  Flowsheets (Taken 03/26/2021 2050 by [place], Caitlin, RN)  Free from Infection during hospitalization:   Assess and monitor for signs and symptoms of infection   Monitor lab/diagnostic results   Monitor all insertion sites (i.e. indwelling lines, tubes, urinary catheters, and drains)   Encourage patient and family to use good hand hygiene technique     Problem: Pain  Goal: Pain at adequate level as identified by patient  Outcome: Progressing  Flowsheets (Taken 03/26/2021 2050 by [place], Caitlin, RN)  Pain at adequate level as identified by patient:   Identify patient comfort function goal   Assess for risk of opioid induced respiratory depression, including  snoring/sleep apnea. Alert healthcare team of risk factors identified.   Assess pain on admission, during daily assessment and/or before any "as needed" intervention(s)   Reassess pain within 30-60 minutes of any procedure/intervention, per Pain Assessment, Intervention, Reassessment (AIR) Cycle   Evaluate if patient comfort function goal is met   Evaluate patient's satisfaction with pain management progress   Offer non-pharmacological pain management interventions   Include patient/patient care companion in decisions related to pain management as needed     Problem: Side Effects from Pain Analgesia  Goal: Patient will experience minimal side effects of analgesic therapy  Outcome: Progressing  Flowsheets (Taken 03/26/2021 0346 by Junie Bame, RN)  Patient will experience minimal side effects of analgesic therapy:   Monitor/assess patient's respiratory status (RR depth, effort, breath sounds)   Assess for changes in cognitive function   Prevent/manage side effects per LIP orders (i.e. nausea, vomiting, pruritus, constipation, urinary retention, etc.)   Evaluate for opioid-induced sedation with appropriate assessment tool (i.e. POSS)     Problem: Compromised Hemodynamic Status  Goal: Vital signs and fluid balance maintained/improved  Outcome: Progressing  Flowsheets (Taken 03/26/2021 2050 by [place], Caitlin, RN)  Vital signs and fluid balance are maintained/improved:   Position patient for maximum circulation/cardiac output   Monitor/assess vitals and hemodynamic parameters with position changes   Monitor intake and output. Notify LIP if urine output is less than 30 mL/hour.   Monitor/assess lab values and report abnormal values   Monitor and compare daily weight     Problem: Inadequate Gas Exchange  Goal: Adequate oxygenation and improved ventilation  Outcome: Progressing  Flowsheets (Taken 03/26/2021 2050 by [place], Caitlin, RN)  Adequate oxygenation and improved ventilation:   Assess lung sounds   Monitor SpO2 and  treat as needed   Provide mechanical and oxygen support to facilitate gas exchange   Plan activities to conserve energy: plan rest periods   Increase activity as tolerated/progressive mobility   Teach/reinforce use of incentive spirometer 10 times per hour while awake, cough and deep breath as needed   Consult/collaborate with Respiratory Therapy     Problem: Compromised Tissue integrity  Goal: Damaged tissue is healing and protected  Outcome: Progressing  Flowsheets (Taken 03/26/2021 2050 by [place], Caitlin, RN)  Damaged tissue is healing and protected:   Monitor/assess Braden scale every shift   Provide wound care per wound care algorithm   Reposition patient every 2 hours and as needed unless able to reposition self   Increase activity as tolerated/progressive mobility   Avoid shearing injuries   Relieve pressure to bony prominences for patients at moderate and high risk   Use bath wipes, not soap and water, for daily bathing   Use incontinence wipes for cleaning urine, stool and caustic drainage. Foley care as needed   Keep intact skin clean and dry   Monitor external devices/tubes for correct placement to prevent pressure, friction and shearing   Monitor patient's hygiene practices  Goal: Nutritional status is improving  Outcome: Progressing  Flowsheets (Taken 03/26/2021 2050 by [place], Caitlin, RN)  Nutritional status is improving:   Allow adequate time for meals   Encourage patient to take dietary supplement(s) as ordered   Collaborate with Clinical Nutritionist   Include patient/patient care companion in decisions related to nutrition

## 2021-03-27 NOTE — Progress Notes (Signed)
CCM Attending Physician   Progress Note                                                                                                                  Patient Name: [name], [name] 50 y.o. female admitted with SOB (shortness of breath)  Admit Date: 03/22/2021    Patient status: Inpatient        Assessment & Plan :       Interstitial pneumonitis-nonspecific  Acute COPD exacerbation  Pulmonary hypertension  Hypokalemia  Hypoxic respiratory insufficiency  Morbid obesity  Chronic back pain  Tobacco dependence      Bronchodilators   Chest PT  O2 supplementation when needed to keep sats greater than 92- 94%  Encourage OOB and mobilization  PT/OT  Basic Chest Physiotherapy exercises and Incentive spirometry to be encouraged to minimize atelectasis during hospitalization.  Patient advised on how to use the I-S.  CONT  steroids   Chest x-ray shows persistent but mildly improved interstitial airspace disease  CPAP for OSA. Will need outpatient sleep study upon discharge      Discussed with patient  Discussed with nursing staff.      Subjective:     No acute events overnight      Review of Systems:         Review of systems    A 12 point comprehensive review of system was performed and negative except for positives mentioned elsewhere in this note     General ROS:_0    negative other than : _1  chills, _2  fatigue, _3  fever  . Respiratory ROS:_4  cough,_5 shortness of breath,  _6  wheezing  . Cardiovascular ROS:  _7  chest pain _8  dyspnea on exertion, _9 palpitation  . Gastrointestinal ROS: _10  abdominal pain, _11 change in bowel habits,  _12 black/ bloody stools  ,_13  No nausea vomiting   . Genito-Urinary ROS: _14  dysuria,_15  trouble voiding,_16  hematuria  . Musculoskeletal ROS:_17  negative  . Neurological ROS:_18  negative , _19 confusion, _20 dizziness, _21 headaches, _22 speech problems, _23  weakness  . ENT : _24  headaches,   _25  vertigo  . Ophthalmic : _26   double vision,  _27   photophobia  . Dermatological:  _28  significant skin rash        Physical  Exam:         Physical Exam:    General :      in no significant distress     HENT:     Nose: no nasal discharge      Ears - no external ear lesions seen     Mouth - clear oral mucosa, moist      Neck -  no visible thyromegaly.     Cardiovascular:          Normal S1, S2, no murmurs, no significant JVD elevation,     Pulmonary/Chest Wall:               no use of accessory muscles of respiration         end expiratory wheezes, fine crackles more prominent at the  bases                Abdominal:            soft, nontender, morbidly obese     Extremities         + pedal edema, no visible cyanosis,         peripheral pulses palpable bilaterally     Neurological:               no focal findings or movement disorder noted      alert, oriented to person, place,time      follows commands     Eyes     PERRL,  sclera non-discolored     Capillary refill time less than 3 seconds                Labs:   Reviewed in EMR      Rads:   Reviewed in EMR    Services included the following: chart data review, reviewing nursing notes and/or old charts, documentation time, consultant collaboration regarding findings and treatment options, medication orders and management, communication with RNs, direct patient care, re-evaluations, vital sign assessments and ordering, interpreting, reviewing diagnostic studies/lab tests.      Alfredia Ferguson MD, M[facility]          This note was generated by the Epic EMR system/ Dragon speech recognition and may contain inherent errors or omissions not intended by the user. Grammatical errors, random word insertions, deletions, pronoun errors and incomplete sentences are occasional consequences of this technology due to software limitations. Not all errors are caught or corrected. If there are questions or concerns about the content of this note or information contained within the body of this dictation they should be addressed directly with the author for clarification.

## 2021-03-27 NOTE — Progress Notes (Signed)
[facility]  PROGRESS NOTE      Patient: [name]  Date: 03/27/2021   LOS: 5 Days  Admission Date: 03/22/2021   MRN: [id number]  Attending: Dr. Romana Juniper Blair Hailey, PA-C  Please contact me on secure chat or the following Spectralink [phone]       ASSESSMENT/PLAN     [name] is a 50 y.o. female with a PMHx of tobacco dependence, chronic respiratory failure secondary to COPD (O2 dependent on 2 L via NC), history of psychosis, anxiety/depression, OSA (not using CPAP), morbid obesity, and chronic back pain admitted with acute COPD exacerbation, interstitial pneumonitis.      Interval Summary:     Patient again somnolent this morning, can not CPAP overnight. Patient is able to answer questions but nods off multiple times during conversation. Reports SOB and chest tightness is improving. CXR today personally reviewed, with persistent but mildly improved interstitial and airspace disease. Will continue to taper IV steroids. Patient will need sleep study and assistance with CPAP on discharge.    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  Enlarged main pulmonary trunk suggestive of underlying pulmonary arterial hypertension. CTA chest without definite evidence of PE, again notes bilateral ground glass infiltrates.  -Solumedrol 40 mg every 8 hours  -Titrate oxygen to maintain O2 saturations 88 to 94%  -Ceftazidime adjusted 5/5 azithromycin + ceftriaxone  -Continue DuoNebs every BID  -Antitussives as needed  -BNP and serial troponins wnl, Echocardiogram EF 60-65%, mild TR, moderate pulm htn  -ESR and CRP elevated, ANA and Rh negative. HIV non-reactive.  -Sputum  culture contaminated, RVP negative  -Appreciate pulmonology evaluation, likely nonspecific interstitial pneumonitis. Possible bronchoscopy if remains symptomatic  -Continue educating on the importance of smoking cessation, patient is motivated.   -PT/OT recommending home with home health    Hyperkalemia  -Improved with kayexalate, renal diet. Continue regular diet at patient request.  -Daily BMP    Altered mental status, labile  Hypercarbia   Patient with increased somnolence in the morning that improves by the afternoon. Suspect secondary to CO2 retention overnight  -ABG 5/5 with stable pH, pCO2 50.5.     Chronic back pain  -Continue home indomethacin. Continue gabapentin and oxycodone at tapered doses    History of OSA  -CPAP ordered this admission, however patient is not tolerating  -Outpatient sleep study on discharge, patient reports insurance refusing to replace CPAP    Morbid obesity  -Lifestyle modification with diet and exercise on discharge    Anxiety/depression  -Continue home Xanax, risperidone, and sertraline. Atomoxetine non-formulary, resume on discharge    Tobacco dependence  -Cessation advised, continue nicotine patch. Will need prescription on discharge    GERD  -Pepcid     Dysuria  -Trace LE, continue abx as above      Analgesia: Tylenol, oxycodone, gabapentin    Nutrition: regular    DVT Prophylaxis:Heparin       Code Status: Full    DISPO: Likely home with home health next 24 hours if tolerates further steroid weaning.       SUBJECTIVE     [name] is somnolent this morning however is able to answer questions between dozing off. Reports SOB and chest tightness  continue to improve, feels closer to baseline. No fever, chills, N/V/D.    MEDICATIONS     Current Facility-Administered Medications   Medication Dose Route Frequency   . albuterol-ipratropium  3 mL Nebulization BID   . azithromycin  500 mg Intravenous Daily   . cefTRIAXone  1 g Intravenous Q24H   . cetirizine  10 mg Oral Daily    . famotidine  20 mg Oral BID   . heparin (porcine)  5,000 Units Subcutaneous Q8H [place]   . indomethacin  50 mg Oral BID AC   . lidocaine  1 patch Transdermal Q24H   . methylPREDNISolone  60 mg Intravenous TID   . montelukast  10 mg Oral QHS   . nicotine  1 patch Transdermal Daily   . oxybutynin  10 mg Oral TID   . risperiDONE  3 mg Oral BID   . sertraline  100 mg Oral BID       PHYSICAL EXAM     Vitals:    03/27/21 0714   BP: 117/74   Pulse: 66   Resp: 17   Temp: 98.1 F (36.7 C)   SpO2: 96%       Temperature: Temp  Min: 98.1 F (36.7 C)  Max: 98.6 F (37 C)  Pulse: Pulse  Min: 66  Max: 71  Respiratory: Resp  Min: 17  Max: 22  Non-Invasive BP: BP  Min: 100/64  Max: 117/74  Pulse Oximetry SpO2  Min: 91 %  Max: 97 %    Intake and Output Summary (Last 24 hours) at Date Time    Intake/Output Summary (Last 24 hours) at 03/27/2021 1159  Last data filed at 03/26/2021 2257  Gross per 24 hour   Intake 2210 ml   Output 800 ml   Net 1410 ml       GEN APPEARANCE: Somnolent, NAD.  HEENT: PERLA; EOMI; Conjunctiva Clear  NECK: Supple; No bruits  CVS: RRR, S1, S2; No M/G/R  LUNGS: Coarse breath sounds bilaterally  ABD: Obese abdomen, Soft; No TTP; + Normoactive BS  EXT: No edema; Pulses 2+ and intact  Skin exam:  no pallor  NEURO: CN 2-12 intact; No Focal neurological deficits  CAP REFILL:  Normal  MENTAL STATUS:  Altered    Exam done by Edwin Dada, PA on 03/27/21 at 11:59 AM      LABS     Recent Labs   Lab 03/27/21  0641 03/26/21  0404 03/25/21  0430   WBC 12.34* 14.01* 14.93*   RBC 4.18 4.14 3.92   Hgb 12.2 12.2 11.2*   Hematocrit 37.6 37.5 35.8   MCV 90.0 90.6 91.3   Platelets 339 342 222       Recent Labs   Lab 03/27/21  0641 03/26/21  0404 03/25/21  0430 03/24/21  0805 03/23/21  0306   Sodium 136 137 136 142 138   Potassium 4.7 4.2 5.4* 5.2* 5.0   Chloride 102 100 103 110 105   CO2 _0 20*   BUN 17.0 13.0 9.0 9.0 8.0   Creatinine 0.6 0.6 0.6 0.6 0.6   Glucose 150* 150* 163* 139* 161*   Calcium 10.3 10.2 9.0 10.1 9.1        Recent Labs   Lab 03/22/21  1949   ALT 10   AST (SGOT) 13   Bilirubin, Total 0.5   Albumin 2.9*   Alkaline Phosphatase 96       Recent Labs  Lab 03/23/21  1704 03/23/21  0306 03/22/21  1949   Troponin I <0.01 <0.01 <0.01       Recent Labs   Lab 03/23/21  0306   PT INR 0.9   PT 10.2       Microbiology Results (last 15 days)     Procedure Component Value Units Date/Time    Respiratory Pathogen Panel w/COVID-19, PCR [[id number]] Collected: 03/24/21 1307    Order Status: Completed Specimen: Nasopharyngeal Updated: 03/25/21 0512     Adenovirus Not Detected     Coronavirus 229E Not Detected     Coronavirus HKU1 Not Detected     Coronavirus NL63 Not Detected     Coronavirus OC43 Not Detected     SARS CoV-2 RNA Not Detected     Human Metapneumovirus Not Detected     Human Rhinovirus/Enterovirus Not Detected     Influenza A Not Detected     Influenza A/H1 Not Detected     Influenza AH1 - 2009 Not Detected     Influenza A/H3 Not Detected     Influenza B Not Detected     Parainfluenza Virus 1 Not Detected     Parainfluenza Virus 2 Not Detected     Parainfluenza Virus 3 Not Detected     Parainfluenza Virus 4 Not Detected     Respiratory Syncytial Virus Not Detected     Bordetella pertussis Not Detected     Bordetella parapertussis PCR Not Detected     Chlamydophila pneumoniae Not Detected     Mycoplasma pneumoniae Not Detected     Source NP Swab     Test Comment for Respiratory Panel with COVID See Below     Comment: Testing performed using the [employer] Film[employer]  Respiratory Panel (RP 2.1)  This test is for the qualitative detection of  respiratory pathogen nucleic acid. This assay cannot  differentiate between Rhinovirus/Enterovirus. If  necessary for patient care, a positive result for  Rhinovirus/Enterovirus may be followed-up using an  alternate method. This assay should not be used if  B. pertussis infection is specifically suspected as it  is less sensitive than other alternatives. If suspected,  ensure that B.  pertussis specific testing is ordered.  Recent administration of a nasal influenza vaccine may  cause false positive results for Influenza A and/or  Influenza B.  Viral and bacterial nucleic acids may persist even  though no viable organism is present. Detection of  nucleic acid does not imply that the corresponding  organisms are infectious or are the causative agents of  clinical symptoms. Positive results of this test do not  rule out coinfection with other organisms. A negative  result does not exclude the possibility of viral or  bacterial infection.  Assay performance characteristics  may vary with circulating strains and this assay may not  be able to distinguish between existing viral strains  and new variants as they emerge. Results of this test  should not be used as the sole basis for diagnosis,  treatment, or other patient management decisions.  This assay is FDA authorized for nasopharyngeal swab  samples.  Performance characteristics for  Bronchoalveolar lavage samples have been determined by  the [facility] laboratory. Other sample types are unacceptable.  Invalid results may be due to inhibiting substances in  the specimen and recollection should occur.          Purpose of COVID testing Diagnostic -[facility]    Narrative:      For NP, Call laboratory for  VTM NP Swab collection device.  Bronchoalveolar Lavage is submitted in a sterile container.  Diagnostic -[facility]I    CULTURE + Lacretia Leigh [[id number]] Collected: 03/23/21 2208    Order Status: Completed Specimen: Sputum, Expectorated Updated: 03/24/21 0620    Narrative:      Results Faxed [phone], by [id number] on 03/24/2021 at 06:20  Culture and Gram Stain, Aerobic, Respiratory was cancelled on 03/24/21 at  06:20 by [phone]; Specimen quality is inadequate for culture  Specimen appears  to be saliva  Please submit another sputum specimen  Induction of sputum may  improve specimen quality  ORDER#: [id number]                                     ORDERED BY: JABBAR, LUBNA  SOURCE: Sputum, Expectorated as below                COLLECTED:  03/23/21 22:08  ANTIBIOTICS AT COLL.:                                RECEIVED :  03/24/21 01:47  Results Faxed [phone], [place] [id number] on 03/24/2021 at 06:20  Culture and Gram Stain, Aerobic, Respiratory was cancelled on 03/24/21 at 06:20 by [id number]; Specimen quality is inadequate for  culture  Specimen appears to be saliva  Please submit another sputum specimen  Induction of sputum may improve specimen quality  Stain, Gram (Respiratory)                  FINAL       03/24/21 06:20  03/24/21   Smear contains > or = 10 squamous epithelial cells per low             power field, suggestive of significant oral contamination and             poor quality specimen. Culture not performed.             Please recollect if clinically indicated.  Culture and Gram Stain, Aerobic, RespiratorCANCELLED   03/24/21 06:20            - cancelled on 03/24/21 06:20   by  [id number]   Specimen quality is inadequate for culture  Specimen appears to be saliva   Please submit another sputum specimen  Induction of sputum may improve specimen   quality      Culture Blood Aerobic and Anaerobic [[id number]] Collected: 03/22/21 2349    Order Status: Completed Specimen: Arm from Blood, Venipuncture Updated: 03/27/21 0621    Narrative:      ORDER#: [id number]                                    ORDERED BY: HE, ALBERT  SOURCE: Blood, Venipuncture Arm                      COLLECTED:  03/22/21 23:49  ANTIBIOTICS AT COLL.:                                RECEIVED :  03/23/21 05:29  Culture Blood Aerobic and Anaerobic        PRELIM      03/27/21 06:21  03/24/21   No Growth  after 1 day/s of incubation.  03/25/21   No Growth after 2 day/s of incubation.  03/26/21   No Growth after 3 day/s of incubation.  03/27/21   No Growth after 4 day/s of incubation.      Culture Blood Aerobic and Anaerobic [[id number]] Collected: 03/22/21 2349    Order Status: Completed Specimen: Arm  from Blood, Venipuncture Updated: 03/27/21 0621    Narrative:      ORDER#: [id number]                                    ORDERED BY: HE, ALBERT  SOURCE: Blood, Venipuncture Arm                      COLLECTED:  03/22/21 23:49  ANTIBIOTICS AT COLL.:                                RECEIVED :  03/23/21 05:29  Culture Blood Aerobic and Anaerobic        PRELIM      03/27/21 06:21  03/24/21   No Growth after 1 day/s of incubation.  03/25/21   No Growth after 2 day/s of incubation.  03/26/21   No Growth after 3 day/s of incubation.  03/27/21   No Growth after 4 day/s of incubation.      COVID-19 (SARS-CoV-2) and Influenza A/B, NAA (Liat Rapid)- Admission [[id number]] Collected: 03/22/21 2227    Order Status: Completed Specimen: Culturette from Nasopharyngeal Updated: 03/22/21 2304     Purpose of COVID testing Diagnostic -PUI     SARS-CoV-2 Specimen Source Nasal Swab     SARS CoV 2 Overall Result Not Detected     Comment: __________________________________________________  -A result of "Detected" indicates POSITIVE for the    presence of SARS CoV-2 RNA  -A result of "Not Detected" indicates NEGATIVE for the    presence of SARS CoV-2 RNA  __________________________________________________________  Test performed using the Roche cobas Liat SARS-CoV-2 assay. This assay is  only for use under the Food and Drug Administration's Emergency Use  Authorization. This is a real-time RT-PCR assay for the qualitative  detection of SARS-CoV-2 RNA. Viral nucleic acids may persist in vivo,  independent of viability. Detection of viral nucleic acid does not imply the  presence of infectious virus, or that virus nucleic acid is the cause of  clinical symptoms. Negative results do not preclude SARS-CoV-2 infection and  should not be used as the sole basis for diagnosis, treatment or other  patient management decisions. Negative results must be combined with  clinical observations, patient history, and/or epidemiological information.  Invalid  results may be due to inhibiting substances in the specimen and  recollection should occur. Please see Fact Sheets for patients and providers  located:  https://www.benson-chung.com/          Influenza A Not Detected     Influenza B Not Detected     Comment: Test performed using the Roche cobas Liat SARS-CoV-2 & Influenza A/B assay.  This assay is only for use under the Food and Drug Administration's  Emergency Use Authorization. This is a multiplex real-time RT-PCR assay  intended for the simultaneous in vitro qualitative detection and  differentiation of SARS-CoV-2, influenza A, and influenza B virus RNA. Viral  nucleic acids may persist in vivo, independent of viability. Detection of  viral nucleic acid does not imply the presence of infectious virus, or that  virus nucleic acid is the cause of clinical symptoms. Negative results do  not preclude SARS-CoV-2, influenza A, and/or influenza B infection and  should not be used as the sole basis for diagnosis, treatment or other  patient management decisions. Negative results must be combined with  clinical observations, patient history, and/or epidemiological information.  Invalid results may be due to inhibiting substances in the specimen and  recollection should occur. Please see Fact Sheets for patients and providers  located: http://olson-hall.info/.         Narrative:      o Collect and clearly label specimen type:  o PREFERRED-Upper respiratory specimen: One Nasal Swab in  Transport Media.  o Hand deliver to laboratory ASAP  Diagnostic -PUI           RADIOLOGY     Upon my review:    Claris Gladden, PA  11:59 AM 03/27/2021

## 2021-03-28 LAB — ECG 12-LEAD
Atrial Rate: 77 {beats}/min
IHS MUSE NARRATIVE AND IMPRESSION: NORMAL
P Axis: 32 degrees
P-R Interval: 164 ms
Q-T Interval: 374 ms
QRS Duration: 94 ms
QTC Calculation (Bezet): 423 ms
R Axis: 14 degrees
T Axis: 32 degrees
Ventricular Rate: 77 {beats}/min

## 2021-03-28 LAB — BLOOD GAS, ARTERIAL
Arterial Total CO2: 52.8 mEq/L — ABNORMAL HIGH (ref 24.0–30.0)
Base Excess, Arterial: 2 mEq/L (ref <=2.0)
FIO2: 28 %
HCO3, Arterial: 26.8 mEq/L (ref 23.0–29.0)
O2 Flow: 2 L/min
O2 Sat, Arterial: 96.3 % (ref 95.0–100.0)
Temperature: 37
pCO2, Arterial: 44.3 mmHg (ref 35.0–45.0)
pH, Arterial: 7.398 (ref 7.350–7.450)
pO2, Arterial: 88.6 mmHg (ref 80.0–90.0)

## 2021-03-28 LAB — CBC
Absolute NRBC: 0 10*3/uL (ref 0.00–0.00)
Hematocrit: 38.2 % (ref 34.7–43.7)
Hgb: 12.4 g/dL (ref 11.4–14.8)
MCH: 29.3 pg (ref 25.1–33.5)
MCHC: 32.5 g/dL (ref 31.5–35.8)
MCV: 90.3 fL (ref 78.0–96.0)
MPV: 10 fL (ref 8.9–12.5)
Nucleated RBC: 0 /100 WBC (ref 0.0–0.0)
Platelets: 368 10*3/uL — ABNORMAL HIGH (ref 142–346)
RBC: 4.23 10*6/uL (ref 3.90–5.10)
RDW: 14 % (ref 11–15)
WBC: 15.44 10*3/uL — ABNORMAL HIGH (ref 3.10–9.50)

## 2021-03-28 LAB — BASIC METABOLIC PANEL
Anion Gap: 9 (ref 5.0–15.0)
BUN: 17 mg/dL (ref 7.0–19.0)
CO2: 25 mEq/L (ref 22–29)
Calcium: 9.9 mg/dL (ref 8.5–10.5)
Chloride: 100 mEq/L (ref 100–111)
Creatinine: 0.6 mg/dL (ref 0.6–1.0)
Glucose: 119 mg/dL — ABNORMAL HIGH (ref 70–100)
Potassium: 4.9 mEq/L (ref 3.5–5.1)
Sodium: 134 mEq/L — ABNORMAL LOW (ref 136–145)

## 2021-03-28 LAB — GFR: EGFR: >60

## 2021-03-28 MED ORDER — FLUTICASONE FUROATE-VILANTEROL 200-25 MCG/INH IN AEPB
1.0000 | INHALATION_SPRAY | Freq: Every morning | RESPIRATORY_TRACT | Status: DC
Start: 2021-03-28 — End: 2021-03-31
  Administered 2021-03-29 – 2021-03-31 (×3): 1 via RESPIRATORY_TRACT
  Filled 2021-03-28: qty 14

## 2021-03-28 MED ORDER — ALBUTEROL-IPRATROPIUM 2.5-0.5 (3) MG/3ML IN SOLN
3.0000 mL | Freq: Two times a day (BID) | RESPIRATORY_TRACT | Status: DC
Start: 2021-03-28 — End: 2021-03-28

## 2021-03-28 MED ORDER — ALBUTEROL-IPRATROPIUM 2.5-0.5 (3) MG/3ML IN SOLN
3.0000 mL | Freq: Two times a day (BID) | RESPIRATORY_TRACT | Status: DC
Start: 2021-03-28 — End: 2021-03-31
  Administered 2021-03-28 – 2021-03-31 (×6): 3 mL via RESPIRATORY_TRACT
  Filled 2021-03-28 (×4): qty 3

## 2021-03-28 MED ORDER — ALBUTEROL-IPRATROPIUM 2.5-0.5 (3) MG/3ML IN SOLN
3.0000 mL | Freq: Four times a day (QID) | RESPIRATORY_TRACT | Status: DC
Start: 2021-03-28 — End: 2021-03-28

## 2021-03-28 MED ORDER — OXYCODONE HCL 5 MG PO TABS
5.0000 mg | ORAL_TABLET | ORAL | Status: DC | PRN
  Administered 2021-03-28 – 2021-03-31 (×13): 5 mg via ORAL
  Filled 2021-03-28 (×13): qty 1

## 2021-03-28 MED ORDER — PREDNISONE 20 MG PO TABS
50.0000 mg | ORAL_TABLET | Freq: Every morning | ORAL | Status: DC
Start: 2021-03-28 — End: 2021-03-31
  Administered 2021-03-28 – 2021-03-31 (×4): 50 mg via ORAL
  Filled 2021-03-28 (×4): qty 1

## 2021-03-28 MED ORDER — FUROSEMIDE 10 MG/ML IJ SOLN
20.0000 mg | Freq: Once | INTRAMUSCULAR | Status: AC
Start: 2021-03-28 — End: 2021-03-28
  Administered 2021-03-28: 20 mg via INTRAVENOUS
  Filled 2021-03-28: qty 2

## 2021-03-28 NOTE — Plan of Care (Signed)
Pt alert and oriented x 4, VSS, sinus rhythm on tele. Pt is on 2L NC  Pt ambulates at home with walker and home O2. Pt awaiting d/c  to home with O2 needs. Walking O2 needed per CM. Night shift RN notified. All safety precautions in place and maintained. Purposeful rounding was done. Will continue to monitor for changes in Pt status.        Problem: Moderate/High Fall Risk Score >5  Goal: Patient will remain free of falls  Outcome: Progressing     Problem: Safety  Goal: Patient will be free from injury during hospitalization  Outcome: Progressing  Flowsheets (Taken 03/26/2021 2050 by [place], Caitlin, RN)  Patient will be free from injury during hospitalization:   Assess patient's risk for falls and implement fall prevention plan of care per policy   Use appropriate transfer methods   Ensure appropriate safety devices are available at the bedside   Provide and maintain safe environment   Hourly rounding   Assess for patients risk for elopement and implement [place] per policy   Include patient/ family/ care giver in decisions related to safety   Provide alternative method of communication if needed (communication boards, writing)  Goal: Patient will be free from infection during hospitalization  Outcome: Progressing     Problem: Pain  Goal: Pain at adequate level as identified by patient  Outcome: Progressing  [place] (Taken 03/26/2021 2050 by Martinique, Caitlin, RN)  Pain at adequate level as identified by patient:   Identify patient comfort function goal   Assess for risk of opioid induced respiratory depression, including snoring/sleep apnea. Alert healthcare team of risk factors identified.   Assess pain on admission, during daily assessment and/or before any "as needed" intervention(s)   Reassess pain within 30-60 minutes of any procedure/intervention, per Pain Assessment, Intervention, Reassessment (AIR) Cycle   Evaluate if patient comfort function goal is met   Evaluate patient's satisfaction with pain  management progress   Offer non-pharmacological pain management interventions   Include patient/patient care companion in decisions related to pain management as needed     Problem: Side Effects from Pain Analgesia  Goal: Patient will experience minimal side effects of analgesic therapy  Outcome: Progressing     Problem: Discharge Barriers  Goal: Patient will be discharged home or other facility with appropriate resources  Outcome: Progressing  Flowsheets (Taken 03/25/2021 1825 by Gwendlyn Deutscher, Angelica, RN)  Discharge to home or other facility with appropriate resources: Provide appropriate patient education     Problem: Psychosocial and Spiritual Needs  Goal: Demonstrates ability to cope with hospitalization/illness  Outcome: Progressing     Problem: Compromised Hemodynamic Status  Goal: Vital signs and fluid balance maintained/improved  Outcome: Progressing     Problem: Inadequate Gas Exchange  Goal: Adequate oxygenation and improved ventilation  Outcome: Progressing  Flowsheets (Taken 03/28/2021 2018)  Adequate oxygenation and improved ventilation:   Assess lung sounds   Monitor SpO2 and treat as needed   Provide mechanical and oxygen support to facilitate gas exchange   Teach/reinforce use of incentive spirometer 10 times per hour while awake, cough and deep breath as needed     Problem: Compromised Tissue integrity  Goal: Damaged tissue is healing and protected  Outcome: Progressing  Goal: Nutritional status is improving  Outcome: Progressing

## 2021-03-28 NOTE — Plan of Care (Signed)
Pt is AOx4. Pt is able to turn in the bed with minimal assistance. Pt reports pain in her back. Pt medications given as ordered. Pt denies chest pain. Pt was given bed bath and linens changed. Pt is on tele. Will continue to monitor.    Problem: Moderate/High Fall Risk Score >5  Goal: Patient will remain free of falls  Outcome: Progressing  Flowsheets (Taken 03/27/2021 1940)  Moderate Risk (6-13):   MOD-Consider activation of bed alarm if appropriate   MOD-Floor mat at bedside (where available) if appropriate   MOD-Remain with patient during toileting   MOD-Utilize diversion activities   MOD-Perform dangle, stand, walk (DSW) prior to mobilization   MOD-Request PT/OT consult order for patients with gait/mobility impairment   MOD-Use gait belt when appropriate     Problem: Safety  Goal: Patient will be free from injury during hospitalization  Outcome: Progressing  Flowsheets (Taken 03/26/2021 2050 by Martinique, Caitlin, RN)  Patient will be free from injury during hospitalization:   Assess patient's risk for falls and implement fall prevention plan of care per policy   Use appropriate transfer methods   Ensure appropriate safety devices are available at the bedside   Provide and maintain safe environment   Hourly rounding   Assess for patients risk for elopement and implement [place] per policy   Include patient/ family/ care giver in decisions related to safety   Provide alternative method of communication if needed (communication boards, writing)  Goal: Patient will be free from infection during hospitalization  Outcome: Progressing  Flowsheets (Taken 03/26/2021 2050 by Martinique, Caitlin, RN)  Free from Infection during hospitalization:   Assess and monitor for signs and symptoms of infection   Monitor lab/diagnostic results   Monitor all insertion sites (i.e. indwelling lines, tubes, urinary catheters, and drains)   Encourage patient and family to use good hand hygiene technique     Problem: Pain  Goal: Pain at  adequate level as identified by patient  Outcome: Progressing  Flowsheets (Taken 03/26/2021 2050 by Martinique, Caitlin, RN)  Pain at adequate level as identified by patient:   Identify patient comfort function goal   Assess for risk of opioid induced respiratory depression, including snoring/sleep apnea. Alert healthcare team of risk factors identified.   Assess pain on admission, during daily assessment and/or before any "as needed" intervention(s)   Reassess pain within 30-60 minutes of any procedure/intervention, per Pain Assessment, Intervention, Reassessment (AIR) Cycle   Evaluate if patient comfort function goal is met   Evaluate patient's satisfaction with pain management progress   Offer non-pharmacological pain management interventions   Include patient/patient care companion in decisions related to pain management as needed     Problem: Side Effects from Pain Analgesia  Goal: Patient will experience minimal side effects of analgesic therapy  Outcome: Progressing  Flowsheets (Taken 03/26/2021 0346 by Junie Bame, RN)  Patient will experience minimal side effects of analgesic therapy:   Monitor/assess patient's respiratory status (RR depth, effort, breath sounds)   Assess for changes in cognitive function   Prevent/manage side effects per LIP orders (i.e. nausea, vomiting, pruritus, constipation, urinary retention, etc.)   Evaluate for opioid-induced sedation with appropriate assessment tool (i.e. POSS)     Problem: Psychosocial and Spiritual Needs  Goal: Demonstrates ability to cope with hospitalization/illness  Outcome: Progressing  Flowsheets (Taken 03/25/2021 1825 by Gwendlyn Deutscher, Angelica, RN)  Demonstrates ability to cope with hospitalizations/illness:   Encourage verbalization of feelings/concerns/expectations   Provide quiet environment   Encourage  participation in diversional activity     Problem: Compromised Hemodynamic Status  Goal: Vital signs and fluid balance maintained/improved  Outcome:  Progressing  Flowsheets (Taken 03/26/2021 2050 by Martinique, Caitlin, RN)  Vital signs and fluid balance are maintained/improved:   Position patient for maximum circulation/cardiac output   Monitor/assess vitals and hemodynamic parameters with position changes   Monitor intake and output. Notify LIP if urine output is less than 30 mL/hour.   Monitor/assess lab values and report abnormal values   Monitor and compare daily weight     Problem: Inadequate Gas Exchange  Goal: Adequate oxygenation and improved ventilation  Outcome: Progressing  Flowsheets (Taken 03/26/2021 2050 by Martinique, Caitlin, RN)  Adequate oxygenation and improved ventilation:   Assess lung sounds   Monitor SpO2 and treat as needed   Provide mechanical and oxygen support to facilitate gas exchange   Plan activities to conserve energy: plan rest periods   Increase activity as tolerated/progressive mobility   Teach/reinforce use of incentive spirometer 10 times per hour while awake, cough and deep breath as needed   Consult/collaborate with Respiratory Therapy     Problem: Compromised Tissue integrity  Goal: Damaged tissue is healing and protected  Outcome: Progressing  Flowsheets (Taken 03/26/2021 2050 by Martin[place], Caitlin, RN)  Damaged tissue is healing and protected:   Monitor/assess Braden scale every shift   Provide wound care per wound care algorithm   Reposition patient every 2 hours and as needed unless able to reposition self   Increase activity as tolerated/progressive mobility   Avoid shearing injuries   Relieve pressure to bony prominences for patients at moderate and high risk   Use bath wipes, not soap and water, for daily bathing   Use incontinence wipes for cleaning urine, stool and caustic drainage. Foley care as needed   Keep intact skin clean and dry   Monitor external devices/tubes for correct placement to prevent pressure, friction and shearing   Monitor patient's hygiene practices  Goal: Nutritional status is improving  Outcome:  Progressing  Flowsheets (Taken 03/26/2021 2050 by [place], Caitlin, RN)  Nutritional status is improving:   Allow adequate time for meals   Encourage patient to take dietary supplement(s) as ordered   Collaborate with Clinical Nutritionist   Include patient/patient care companion in decisions related to nutrition

## 2021-03-28 NOTE — Progress Notes (Addendum)
[facility]  PROGRESS NOTE      Patient: [name]  Date: 03/28/2021   LOS: 6 Days  Admission Date: 03/22/2021   MRN: [id number]  Attending: Dr. Romana Juniper Blair Hailey, PA-C  Please contact me on secure chat       ASSESSMENT/PLAN     [name] is a 50 y.o. female with a PMHx of tobacco dependence, chronic respiratory failure secondary to COPD (O2 dependent on 2 L via NC), history of psychosis, anxiety/depression, OSA (not using CPAP), morbid obesity, and chronic back pain admitted with acute COPD exacerbation, interstitial pneumonitis.      Interval Summary:   Patient  Has a history of chronic respiratory failure on 2L home O2 due to COPD and morbid obesity, untreated sleep apnea, and presented with acute on chronic respiratory failure and distress. Lung findings significant for rhonchi, wheezing, and crackles with increased work of breathing for much of her hospital stay, gradually improving. 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 Patient has sleep apnea worsened by her home pain meds. Pain meds have been decreased. She needs sleep study. Pulmonary was consulted, recommending antibiotics and steroids, outpatient pulm followup. Today steroids were switched to oral, and I have asked CM to look for SNF as still significant DOE as well as daytime somnolence due to needing cpap and weaning of pain meds. Imaging on 5/8 shows continued infiltrates, mildly improved.    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  Enlarged main pulmonary trunk suggestive of underlying pulmonary arterial hypertension.  CTA chest without definite evidence of PE, again notes bilateral ground glass infiltrates.  CXR 5/8: persistent mildly improved air space disease  -Solumedrol , change to prednisone today  - 1 dose lasix today  -Titrate oxygen to maintain O2 saturations 88 to 94%  -Ceftazidime adjusted 5/5 azithromycin + ceftriaxone  -Continue DuoNebs  -Antitussives as needed  -BNP and serial troponins wnl, Echocardiogram EF 60-65%, mild TR, moderate pulm htn  -ESR and CRP elevated, ANA and Rh negative. HIV non-reactive.  -Sputum culture contaminated, RVP negative  -Appreciate pulmonology evaluation, likely nonspecific interstitial pneumonitis. Possible bronchoscopy if remains symptomatic  -smoking cessation  - Discussed with Dr Janith Lima    Hyperkalemia  -Improved with kayexalate, renal diet. Continue regular diet at patient request.  -Daily BMP    Altered mental status, labile  Hypercarbia   Patient with increased somnolence in the morning that improves by the afternoon. Suspect secondary to CO2 retention overnight, worsened by oxycodone/gabapentin.    Chronic back pain  -Continue home indomethacin. Continue gabapentin and oxycodone at tapered doses    History of OSA, untreated  -CPAP ordered this admission, however patient is not tolerating  -Outpatient sleep study after discharge, patient reports insurance refusing to replace CPAP    Morbid obesity BMI 54  -Lifestyle modification with diet and exercise on discharge    Anxiety/depression  -Continue home Xanax, risperidone, and sertraline. Atomoxetine non-formulary, resume on discharge    Tobacco dependence  -Cessation advised, continue nicotine patch. Will need prescription on discharge    GERD  -Pepcid     Dysuria  -Trace LE, continue abx as above  Analgesia: Tylenol, oxycodone, gabapentin    Nutrition: regular    DVT Prophylaxis:Heparin       Code Status: Full    DISPO: Likely home with home health vs SNF next 24 hours if tolerates further steroid weaning.       SUBJECTIVE      [name] is somnolent again this am. Wakes up but then quickly back to sleep. No chest pain fever chills nausea/vomiting.    MEDICATIONS     Current Facility-Administered Medications   Medication Dose Route Frequency   . albuterol-ipratropium  3 mL Nebulization BID   . cefTRIAXone  1 g Intravenous Q24H   . cetirizine  10 mg Oral Daily   . famotidine  20 mg Oral BID   . fluticasone furoate-vilanterol  1 puff Inhalation QAM   . heparin (porcine)  5,000 Units Subcutaneous Q8H [place]   . indomethacin  50 mg Oral BID AC   . lidocaine  1 patch Transdermal Q24H   . methylPREDNISolone  40 mg Intravenous TID   . montelukast  10 mg Oral QHS   . nicotine  1 patch Transdermal Daily   . oxybutynin  10 mg Oral TID   . risperiDONE  3 mg Oral BID   . sertraline  100 mg Oral BID       PHYSICAL EXAM     Vitals:    03/28/21 1056   BP: 106/71   Pulse: 77   Resp: 22   Temp: 97.7 F (36.5 C)   SpO2: 95%       Temperature: Temp  Min: 97.5 F (36.4 C)  Max: 98.6 F (37 C)  Pulse: Pulse  Min: 59  Max: 83  Respiratory: Resp  Min: 16  Max: 22  Non-Invasive BP: BP  Min: 104/64  Max: 129/84  Pulse Oximetry SpO2  Min: 95 %  Max: 98 %    Intake and Output Summary (Last 24 hours) at Date Time    Intake/Output Summary (Last 24 hours) at 03/28/2021 1150  Last data filed at 03/28/2021 1045  Gross per 24 hour   Intake 1960 ml   Output 6150 ml   Net -4190 ml       GEN APPEARANCE: Somnolent, NAD.  HEENT: PERLA; EOMI; Conjunctiva Clear  NECK: Supple; No bruits  CVS: RRR, S1, S2; No M/G/R  LUNGS: Coarse breath sounds bilaterally- basilar crackles  ABD: Obese abdomen, Soft; No TTP; + Normoactive BS  EXT: No edema; Pulses 2+ and intact  Skin exam:  no pallor  NEURO: CN 2-12 intact; No Focal neurological deficits  CAP REFILL:  Normal  MENTAL STATUS:  Altered    Exam done by Dion Body, MD on 03/28/21 at 11:50 AM      LABS     Recent Labs   Lab 03/28/21  0359 03/27/21  0641 03/26/21  0404   WBC 15.44* 12.34* 14.01*   RBC 4.23 4.18 4.14   Hgb 12.4  12.2 12.2   Hematocrit 38.2 37.6 37.5   MCV 90.3 90.0 90.6   Platelets 368* 339 342       Recent Labs   Lab 03/28/21  0359 03/27/21  0641 03/26/21  0404 03/25/21  0430 03/24/21  0805   Sodium 134* 136 137 136 142   Potassium 4.9 4.7 4.2 5.4* 5.2*   Chloride 100 102 100 103 110   CO2 _0 BUN 17.0 17.0 13.0 9.0 9.0   Creatinine 0.6 0.6  0.6 0.6 0.6   Glucose 119* 150* 150* 163* 139*   Calcium 9.9 10.3 10.2 9.0 10.1       Recent Labs   Lab 03/22/21  1949   ALT 10   AST (SGOT) 13   Bilirubin, Total 0.5   Albumin 2.9*   Alkaline Phosphatase 96       Recent Labs   Lab 03/23/21  1704 03/23/21  0306 03/22/21  1949   Troponin I <0.01 <0.01 <0.01       Recent Labs   Lab 03/23/21  0306   PT INR 0.9   PT 10.2       Microbiology Results (last 15 days)     Procedure Component Value Units Date/Time    Respiratory Pathogen Panel w/COVID-19, PCR [[id number]] Collected: 03/24/21 1307    Order Status: Completed Specimen: Nasopharyngeal Updated: 03/25/21 0512     Adenovirus Not Detected     Coronavirus 229E Not Detected     Coronavirus HKU1 Not Detected     Coronavirus NL63 Not Detected     Coronavirus OC43 Not Detected     SARS CoV-2 RNA Not Detected     Human Metapneumovirus Not Detected     Human Rhinovirus/Enterovirus Not Detected     Influenza A Not Detected     Influenza A/H1 Not Detected     Influenza AH1 - 2009 Not Detected     Influenza A/H3 Not Detected     Influenza B Not Detected     Parainfluenza Virus 1 Not Detected     Parainfluenza Virus 2 Not Detected     Parainfluenza Virus 3 Not Detected     Parainfluenza Virus 4 Not Detected     Respiratory Syncytial Virus Not Detected     Bordetella pertussis Not Detected     Bordetella parapertussis PCR Not Detected     Chlamydophila pneumoniae Not Detected     Mycoplasma pneumoniae Not Detected     Source NP Swab     Test Comment for Respiratory Panel with COVID See Below     Comment: Testing performed using the [employer]  Respiratory Panel (RP 2.1)  This test  is for the qualitative detection of  respiratory pathogen nucleic acid. This assay cannot  differentiate between Rhinovirus/Enterovirus. If  necessary for patient care, a positive result for  Rhinovirus/Enterovirus may be followed-up using an  alternate method. This assay should not be used if  B. pertussis infection is specifically suspected as it  is less sensitive than other alternatives. If suspected,  ensure that B. pertussis specific testing is ordered.  Recent administration of a nasal influenza vaccine may  cause false positive results for Influenza A and/or  Influenza B.  Viral and bacterial nucleic acids may persist even  though no viable organism is present. Detection of  nucleic acid does not imply that the corresponding  organisms are infectious or are the causative agents of  clinical symptoms. Positive results of this test do not  rule out coinfection with other organisms. A negative  result does not exclude the possibility of viral or  bacterial infection.  Assay performance characteristics  may vary with circulating strains and this assay may not  be able to distinguish between existing viral strains  and new variants as they emerge. Results of this test  should not be used as the sole basis for diagnosis,  treatment, or other patient management decisions.  This assay is FDA authorized for nasopharyngeal swab  samples.  Performance characteristics for  Bronchoalveolar lavage samples have been determined by  the [facility] laboratory. Other sample types are unacceptable.  Invalid results may be due to inhibiting substances in  the specimen and recollection should occur.          Purpose of COVID testing Diagnostic -[facility]I    Narrative:      For NP, Call laboratory for VTM NP Swab collection device.  Bronchoalveolar Lavage is submitted in a sterile container.  Diagnostic -PUI    CULTURE + Lacretia Leigh [[id number]] Collected: 03/23/21 2208    Order Status: Completed Specimen: Sputum,  Expectorated Updated: 03/24/21 0620    Narrative:      Results Faxed [phone], by [id number] on 03/24/2021 at 06:20  Culture and Gram Stain, Aerobic, Respiratory was cancelled on 03/24/21 at  06:20 by [phone]; Specimen quality is inadequate for culture  Specimen appears  to be saliva  Please submit another sputum specimen  Induction of sputum may  improve specimen quality  ORDER#: [id number]                                    ORDERED BY: JABBAR, LUBNA  SOURCE: Sputum, Expectorated as below                COLLECTED:  03/23/21 22:08  ANTIBIOTICS AT COLL.:                                RECEIVED :  03/24/21 01:47  Results Faxed [phone], by [id number] on 03/24/2021 at 06:20  Culture and Gram Stain, Aerobic, Respiratory was cancelled on 03/24/21 at 06:20 by [id number]; Specimen quality is inadequate for  culture  Specimen appears to be saliva  Please submit another sputum specimen  Induction of sputum may improve specimen quality  Stain, Gram (Respiratory)                  FINAL       03/24/21 06:20  03/24/21   Smear contains > or = 10 squamous epithelial cells per low             power field, suggestive of significant oral contamination and             poor quality specimen. Culture not performed.             Please recollect if clinically indicated.  Culture and Gram Stain, Aerobic, RespiratorCANCELLED   03/24/21 06:20            - cancelled on 03/24/21 06:20   by  [id number]   Specimen quality is inadequate for culture  Specimen appears to be saliva   Please submit another sputum specimen  Induction of sputum may improve specimen   quality      Culture Blood Aerobic and Anaerobic [[id number]] Collected: 03/22/21 2349    Order Status: Completed Specimen: Arm from Blood, Venipuncture Updated: 03/28/21 0821    Narrative:      ORDER#: [id number]                                    ORDERED BY: HE, ALBERT  SOURCE: Blood, Venipuncture Arm                      COLLECTED:  03/22/21 23:49  ANTIBIOTICS AT COLL.:                                 RECEIVED :  03/23/21 05:29  Culture Blood Aerobic and Anaerobic        FINAL       03/28/21 08:21  03/28/21   No growth after 5 days of incubation.      Culture Blood Aerobic and Anaerobic [[id number]] Collected: 03/22/21 2349    Order Status: Completed Specimen: Arm from Blood, Venipuncture Updated: 03/28/21 0821    Narrative:      ORDER#: [id number]                                    ORDERED BY: HE, ALBERT  SOURCE: Blood, Venipuncture Arm                      COLLECTED:  03/22/21 23:49  ANTIBIOTICS AT COLL.:                                RECEIVED :  03/23/21 05:29  Culture Blood Aerobic and Anaerobic        FINAL       03/28/21 08:21  03/28/21   No growth after 5 days of incubation.      COVID-19 (SARS-CoV-2) and Influenza A/B, NAA (Liat Rapid)- Admission [[id number]] Collected: 03/22/21 2227    Order Status: Completed Specimen: Culturette from Nasopharyngeal Updated: 03/22/21 2304     Purpose of COVID testing Diagnostic -PUI     SARS-CoV-2 Specimen Source Nasal Swab     SARS CoV 2 Overall Result Not Detected     Comment: __________________________________________________  -A result of "Detected" indicates POSITIVE for the    presence of SARS CoV-2 RNA  -A result of "Not Detected" indicates NEGATIVE for the    presence of SARS CoV-2 RNA  __________________________________________________________  Test performed using the Roche cobas Liat SARS-CoV-2 assay. This assay is  only for use under the Food and Drug Administration's Emergency Use  Authorization. This is a real-time RT-PCR assay for the qualitative  detection of SARS-CoV-2 RNA. Viral nucleic acids may persist in vivo,  independent of viability. Detection of viral nucleic acid does not imply the  presence of infectious virus, or that virus nucleic acid is the cause of  clinical symptoms. Negative results do not preclude SARS-CoV-2 infection and  should not be used as the sole basis for diagnosis, treatment or other  patient management decisions. Negative  results must be combined with  clinical observations, patient history, and/or epidemiological information.  Invalid results may be due to inhibiting substances in the specimen and  recollection should occur. Please see Fact Sheets for patients and providers  located:  https://www.benson-chung.com/          Influenza A Not Detected     Influenza B Not Detected     Comment: Test performed using the Roche cobas Liat SARS-CoV-2 & Influenza A/B assay.  This assay is only for use under the Food and Drug Administration's  Emergency Use Authorization. This is a multiplex real-time RT-PCR assay  intended for the simultaneous in vitro qualitative detection and  differentiation of SARS-CoV-2, influenza A, and influenza B virus RNA. Viral  nucleic acids may persist in vivo, independent of viability.  Detection of  viral nucleic acid does not imply the presence of infectious virus, or that  virus nucleic acid is the cause of clinical symptoms. Negative results do  not preclude SARS-CoV-2, influenza A, and/or influenza B infection and  should not be used as the sole basis for diagnosis, treatment or other  patient management decisions. Negative results must be combined with  clinical observations, patient history, and/or epidemiological information.  Invalid results may be due to inhibiting substances in the specimen and  recollection should occur. Please see Fact Sheets for patients and providers  located: http://olson-hall.info/.         Narrative:      o Collect and clearly label specimen type:  o PREFERRED-Upper respiratory specimen: One Nasal Swab in  Transport Media.  o Hand deliver to laboratory ASAP  Diagnostic -PUI           RADIOLOGY     Upon my review:    Signed,  Dolphus Jenny, MD  11:50 AM 03/28/2021

## 2021-03-28 NOTE — Respiratory Progress Note (Signed)
Respiratory Therapy                              Patient Re-Assessment Note/Protocol Order Changes    Patient has been assessed and re-evaluated for the follow therapies:       Respiratory Orders   (From admission, onward)             Start     Ordered    03/28/21 2100  CPAP continuous-not intubated  QHS (RT)      Question Answer Comment   PEEP 7    FLOW Start at: (Liters per minute) 2        03/28/21 1209    03/28/21 0913  MDI/DPI  Every morning (RT)      Comments:   All Adult patients ordered for Respiratory Therapy, i.e., inhaled meds, secretion clearance/lung expansion or Oxygen greater than 5 liters/min will be evaluated by a Respiratory Therapist and assessed per Respiratory Therapy Patient Driven Protocol.  Initial assessment and changes made per protocol can be found in the progress note section of the patient chart.    03/28/21 0912    03/25/21 2000  Nebulizer treatment intermittent  2 times daily (RT)      Comments:   All Adult patients ordered for Respiratory Therapy, i.e., inhaled meds, secretion clearance/lung expansion or Oxygen greater than 5 liters/min will be evaluated by a Respiratory Therapist and assessed per Respiratory Therapy Patient Driven Protocol.  Initial assessment and changes made per protocol can be found in the progress note section of the patient chart.    03/25/21 1048    03/23/21 0800  Resp Re-Assess Adult (RT Use Only)  2 times daily (RT)       03/23/21 9295              IP Meds - Nasal and Inhaled (From admission, onward)            Start     Stop Status Route Frequency Ordered    03/28/21 1400  albuterol-ipratropium (DUO-NEB) 2.5-0.5(3) mg/3 mL nebulizer 3 mL         -- Verified NEBULIZATION RT - Every 6 hours scheduled 03/28/21 1153    03/28/21 0945  fluticasone furoate-vilanterol (BREO ELLIPTA) 200-25 MCG/INH 1 puff         -- Dispensed IN RT - Every morning 03/28/21 0901               Current Criteria For  Therapy  Secretion Clearance: Other (Comment)  Lung Expansion: None indicated  Medications: Hx of COPD, Asthma, Bronchitis or other documented RAD, Bronchospasms or documented bronchospasms in the last 24 hrs    Expected Outcomes  Secretion: Improved breath sound   Lung Expansion: Improved air movement at the bases   Meds: Decreased bronchospasms, Decreased freq of symptoms, Return to baseline home regimen    Outcomes Met        Meds: No       Reassessment Recommendations  Recommendations: Decrease current treatment plan to BID nebs based on RT protocol.    Patient's orders have been modified as follows per RT Patient Driven Protocol.      If any questions, please contact the Respiratory Therapist assigned to this patient    Thank you

## 2021-03-28 NOTE — UM Notes (Signed)
CSR 5/7, 8    Interstitial pneumonitis    Still on 2 L O2 saturation 95%  HIV nonreactive, RF negative, elevated ESR and C-reactive protein, respiratory pathogen panel negative ANA pending.    Expiratory wheezes, fine crackles more prominent at the bases  Continue IV steroid follow-up x-ray on _0 /6, 71, 22, 117/74  Assessment & Plan 5/8:   Interstitial pneumonitis-nonspecific  Acute COPD exacerbation  Pulmonary hypertension  Hypokalemia  Hypoxic respiratory insufficiency  Morbid obesity  Chronic back pain  Tobacco dependence    Plan 5/8:   NSIP, acute on chronic hypercarbic and hypoxic respiratory failure:   Slow steroid taper. Will discuss with pulm 5/9 whether any indication for bronch. Patient to remain on 2-3L oxygen.   Suspected sleep apnea, obesity hypoventilation, metabolic encephalopathy   Does not have a cpap at home. Do think this is the cause of her morning somnolence (difficult to arouse). Will discuss with pulm as cannot discharge on cpap. Discussed with patient may need snf. Discussed with Judson Roch, patient not getting gabapentin. We have decrease oxycodone which she is on for back pain.    Morbid obesity bmi 53   Other chronic medical issues as per note below.        Interval Summary 5/7:     Patient somnolent this morning, refused CPAP overnight. Improvement in respiratory status this afternoon, patient sitting up in chair. Reports ongoing chest tightness and SOB that has improved since admission. Will decrease Solumedrol 60 mg IV q8h today.    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  Enlarged main pulmonary trunk suggestive of underlying pulmonary arterial hypertension. CTA chest without definite evidence of PE, again notes bilateral ground glass infiltrates.  -Solumedrol 60 mg every 8 hours  -Titrate oxygen to maintain O2 saturations 88 to 94%  -Ceftazidime adjusted 5/5 azithromycin + ceftriaxone  -Continue DuoNebs every 6 hours  -Antitussives as needed  -BNP and serial troponins wnl, Echocardiogram EF 60-65%, mild TR, moderate pulm htn  -ESR and CRP elevated, ANA and Rh negative. HIV non-reactive.  -Sputum culture contaminated, RVP negative  -Appreciate pulmonology evaluation, likely nonspecific interstitial pneumonitis. Possible bronchoscopy Monday if remains symptomatic  -Continue educating on the importance of smoking cessation, patient is motivated.   -PT/OT recommending home with home health    Hyperkalemia  -Improved with kayexalate, renal diet. Will liberalize back to regular diet at patient request.  -Repeat BMP in AM    Altered mental status, resolved  Hypercarbia   -ABG with stable pH, pCO2 50.5.     Chronic back pain  -Continue home indomethacin. Continue gabapentin and oxycodone at tapered doses    History of OSA  -Will start CPAP this admission, patient refused overnight  -Outpatient sleep study on discharge, patient reports insurance refusing to replace CPAP    Morbid obesity  -Lifestyle modification with diet and exercise on discharge    Anxiety/depression  -Continue home Xanax, risperidone, and sertraline. Atomoxetine non-formulary, resume on discharge    Tobacco dependence  -Cessation advised, continue nicotine patch. Will need prescription on  discharge    GERD  -Pepcid     Dysuria  -Trace LE, continue abx as above      Analgesia: Tylenol, oxycodone, gabapentin    Nutrition: regular    DVT Prophylaxis:Heparin       Code Status: Full     Current Facility-Administered  Medications   Medication Dose Route Frequency   . albuterol-ipratropium  3 mL Nebulization BID   . azithromycin  500 mg Intravenous Daily   . cefTRIAXone  1 g Intravenous Q24H   . cetirizine  10 mg Oral Daily   . famotidine  20 mg Oral BID   . heparin (porcine)  5,000 Units Subcutaneous Q8H [place]   . indomethacin  50 mg Oral BID AC   . lidocaine  1 patch Transdermal Q24H   . methylPREDNISolone  60 mg Intravenous TID   . montelukast  10 mg Oral QHS   . nicotine  1 patch Transdermal Daily   . oxybutynin  10 mg Oral TID   . risperiDONE  3 mg Oral BID   . sertraline  100 mg Oral BID

## 2021-03-28 NOTE — Progress Notes (Signed)
PULMONARY PROGRESS NOTE                                                                                                              [phone]    Date Time: 03/28/21 12:05 PM  Patient Name: [name], [name] 50 y.o. female admitted with SOB (shortness of breath)  Admit Date: 03/22/2021    Patient status: Inpatient  Hospital Day: 6           Assessment:     . Acute exacerbation of chronic obstructive pulmonary disease  . Hypoxic respiratory insufficiency  . Morbid obesity  . Obstructive sleep apnea patient has CPAP at home which is broken  . Pneumonia  . Pulmonary hypertension secondary to COPD  . Tobacco dependence    Plan:       . Continue bronchodilators  . Repeat ABG patient seems to be drowsy rule out CO2 retention  . Broad-spectrum antibiotic  . Corticosteroid  . DVT prophylaxis  . Repeat sleep study  . Follow-up with pulmonary  . Avoid narcotics and sleeping medications  . Discharge planning for skilled nursing facility  . Continue patient on nocturnal CPAP at the skilled nursing facility  . Thyroid function test      Subjective:       Patient admitted with COPD exacerbation currently lethargic arousable able to answer to simple questions            Medications:     Current Facility-Administered Medications   Medication Dose Route Frequency   . albuterol-ipratropium  3 mL Nebulization Q6H [place]   . cefTRIAXone  1 g Intravenous Q24H   . cetirizine  10 mg Oral Daily   . famotidine  20 mg Oral BID   . fluticasone furoate-vilanterol  1 puff Inhalation QAM   . heparin (porcine)  5,000 Units Subcutaneous Q8H [place]   . indomethacin  50 mg Oral BID AC   . lidocaine  1 patch Transdermal Q24H   . montelukast  10 mg Oral QHS   . nicotine  1 patch Transdermal Daily   . oxybutynin  10 mg Oral TID   . predniSONE  50 mg Oral QAM W/BREAKFAST   . risperiDONE  3 mg Oral BID   . sertraline  100 mg Oral BID       Review of Systems:        General ROS:  Afebrile    ENT ROS:  No sore throat no nasal discharge   Endocrine ROS:    fatigue   Respiratory ROS: Mild shortness of breath    cardiovascular ROS:  No chest pain or palpitation   Gastrointestinal ROS:  No nausea vomiting diarrhea.  No melanotic stool   Genito-Urinary ROS:  No burning in the urine or hematuria   Musculoskeletal ROS:  No musculoskeletal deformities   Neurological ROS:  No stroke seizure disorder   Dermatological ROS:  No skin rash        Physical Exam:     Vitals:    03/28/21 1056  BP: 106/71   Pulse: 77   Resp: 22   Temp: 97.7 F (36.5 C)   SpO2: 95%         Intake/Output Summary (Last 24 hours) at 03/28/2021 1205  Last data filed at 03/28/2021 1045  Gross per 24 hour   Intake 1960 ml   Output 5250 ml   Net -3290 ml           General appearance - no visible respiratory distress patient does not appear toxic, morbidly obese  Mental status -lethargic  Eyes - EOMI PERRLA  Nose - no nasal discharge  Mouth - mucous membrane is moist.    Neck - no JVD lymphadenopathy or thyromegaly  Chest -decreased breath sound but no wheezing heard  Heart - S1-S2 RRR no S3-S4 no murmur  Abdomen - soft nontender bowel sounds are normal with no hepatosplenomegaly  Neurological - no motor or sensory deficit  Extremities - no edema clubbing or cyanosis  Skin - no skin rash      Labs:     CBC w/Diff CMP   Recent Labs   Lab 03/28/21  0359 03/27/21  0641 03/26/21  0404 03/23/21  0629 03/22/21  1949   WBC 15.44* 12.34* 14.01*  More results in Results Review 10.78*   Hgb 12.4 12.2 12.2  More results in Results Review 12.1   Hematocrit 38.2 37.6 37.5  More results in Results Review 37.0   Platelets 368* 339 342  More results in Results Review 283   MCV 90.3 90.0 90.6  More results in Results Review 88.9   Neutrophils  --   --   --   --  67.9   More results in Results Review = values in this interval not displayed.       PT/INR   Recent Labs   Lab 03/23/21  0306   PT INR 0.9       Recent Labs   Lab 03/28/21  0359 03/27/21  0641 03/26/21  0404 03/23/21  0306 03/22/21  1949   Sodium 134* 136 137   More results in Results Review 137   Potassium 4.9 4.7 4.2  More results in Results Review 4.5   Chloride 100 102 100  More results in Results Review 105   CO2 _0 More results in Results Review 23   BUN 17.0 17.0 13.0  More results in Results Review 9.0   Creatinine 0.6 0.6 0.6  More results in Results Review 0.6   Glucose 119* 150* 150*  More results in Results Review 114*   Calcium 9.9 10.3 10.2  More results in Results Review 9.2   Protein, Total  --   --   --   --  5.5*   Albumin  --   --   --   --  2.9*   AST (SGOT)  --   --   --   --  13   ALT  --   --   --   --  10   Alkaline Phosphatase  --   --   --   --  96   Bilirubin, Total  --   --   --   --  0.5   More results in Results Review = values in this interval not displayed.      Glucose POCT   Recent Labs   Lab 03/28/21  0359 03/27/21  0641 03/26/21  0404 03/25/21  0430 03/24/21  0805 03/23/21  0306 03/22/21  1949   Glucose 119* 150* 150* 163* 139* 161* 114*        Recent Labs   Lab 03/23/21  1704 03/23/21  0306 03/22/21  1949   Troponin I <0.01 <0.01 <0.01         ABGs:    ABG CollectionSite   Date Value Ref Range Status   03/28/2021 Left Radl  Final     Allen's Test   Date Value Ref Range Status   03/28/2021 Yes  Final     pH, Arterial   Date Value Ref Range Status   03/28/2021 7.398 7.350 - 7.450 Final     pCO2, Arterial   Date Value Ref Range Status   03/28/2021 44.3 35.0 - 45.0 mmHg Final     pO2, Arterial   Date Value Ref Range Status   03/28/2021 88.6 80.0 - 90.0 mmHg Final     HCO3, Arterial   Date Value Ref Range Status   03/28/2021 26.8 23.0 - 29.0 mEq/L Final     Base Excess, Arterial   Date Value Ref Range Status   03/28/2021 2.0 -2.0 - 2.0 mEq/L Final     O2 Sat, Arterial   Date Value Ref Range Status   03/28/2021 96.3 95.0 - 100.0 % Final       Urinalysis  Recent Labs   Lab 03/23/21  1933   Urine Type Urine, Clean Ca   Color, UA Yellow   Clarity, UA Hazy   Specific Gravity UA 1.009   Urine pH 7.0   Nitrite, UA Negative   Ketones UA  Negative   Urobilinogen, UA Negative   Bilirubin, UA Negative   Blood, UA Small*   RBC, UA 0 - 2   WBC, UA 0 - 5         Rads:   No results found.      Quillian Quince MD  03/28/2021  12:05 PM

## 2021-03-28 NOTE — Consults (Addendum)
CM consult received: Is SNF possible?  Needs CPAP and doesn't have at home.  Also steroid taper and antibiotics.    Above discussed during Sound Rounds.  Patient will need a [place] SNF.  Referrals submitted via [employer].    4:23PM: Discharge order written.  CM met with patient to discuss discharge planning.  Patient declines SNF and prefers discharge to home with [facility].  Also, patient asked about being evaluated by Pulmonology again and indicated that her oxygen equipment at home needs maintenance.      Referral to [facility] Liaison completed and Dr. Romana Juniper and Dr. Janith Lima informed of patient's requests.    5:43PM: NOTE: Dr. Romana Juniper informed CM that discharge to home will be 03/29/2021.  CM will follow-up with patient re: CPAP.  As per [facility] Liaison, CPAP provided by [facility], [phone].      Memory Argue, MSW, ACM-SW  Social Worker Case Manager [facility]  [phone]

## 2021-03-28 NOTE — Progress Notes (Signed)
Respiratory Therapy Patient Assessment    A2112/A2112-A  03/28/21 11:08 PM  RT: Donivan Scull, RT      Admitting DX: SOB (shortness of breath) [R06.02]    Pulmonary History: COPD    Other Pulm Hx:      Therapy ordered:       Respiratory Orders   (From admission, onward)             Start     Ordered    03/28/21 2100  CPAP continuous-not intubated  QHS (RT)      Question Answer Comment   PEEP 7    FLOW Start at: (Liters per minute) 2        03/28/21 1209    03/28/21 0913  MDI/DPI  Every morning (RT)      Comments:   All Adult patients ordered for Respiratory Therapy, i.e., inhaled meds, secretion clearance/lung expansion or Oxygen greater than 5 liters/min will be evaluated by a Respiratory Therapist and assessed per Respiratory Therapy Patient Driven Protocol.  Initial assessment and changes made per protocol can be found in the progress note section of the patient chart.    03/28/21 0912    03/25/21 2000  Nebulizer treatment intermittent  2 times daily (RT)      Comments:   All Adult patients ordered for Respiratory Therapy, i.e., inhaled meds, secretion clearance/lung expansion or Oxygen greater than 5 liters/min will be evaluated by a Respiratory Therapist and assessed per Respiratory Therapy Patient Driven Protocol.  Initial assessment and changes made per protocol can be found in the progress note section of the patient chart.    03/25/21 1048    03/23/21 0800  Resp Re-Assess Adult (RT Use Only)  2 times daily (RT)       03/23/21 1610               IP Meds - Nasal and Inhaled (From admission, onward)            Start     Stop Status Route Frequency Ordered    03/28/21 2000  albuterol-ipratropium (DUO-NEB) 2.5-0.5(3) mg/3 mL nebulizer 3 mL         -- Dispensed NEBULIZATION RT - 2 times daily 03/28/21 1255    03/28/21 0945  fluticasone furoate-vilanterol (BREO ELLIPTA) 200-25 MCG/INH 1 puff         -- Dispensed IN RT - Every morning 03/28/21 0901             PT able to take deep breath? Yes Incentive  Spirometry Goal (mL): 1500 mL  Incentive Spirometry Achieved (mL): 1200 mL       Surgical Status: None  Airway: Natural   Mobility: Ambulatory with or without assistance  CXR: progressive diffuse lung dissease    Cough Effort: Strong Secretion Amount: Scant    Sputum Consistency: Thick     Can clear secretions with cough? Yes  Can clear secretions with suctioning? N/A     Social History     Tobacco Use   Smoking Status Current Every Day Smoker   . Packs/day: 0.25   . Years: 25.00   . Pack years: 6.25   . Types: Cigarettes   . Last attempt to quit: 12/29/2018   . Years since quitting: 2.2   Smokeless Tobacco Never Used        Breath Sounds:  Bilateral Breath Sounds: Clear, Diminished  R Breath Sounds: Crackles  L Breath Sounds: Crackles    Heart Rate: 74 Resp Rate:  18  SpO2: 95 % O2 Device: Nasal cannula  FiO2: 28 %  O2 Flow Rate (L/min): 2 L/min    Home regimen:  Home Treatments: Albuterol MDI PRN  Home Oxygen: 2L PRN   Home CPAP/BiLevel: no    Criteria for therapy:  Secretion Clearance: Other (Comment)  Lung Expansion: None indicated  Medications: Hx of COPD, Asthma, Bronchitis or other documented RAD    Recommendations/Interventions:  Recommendations/Interventions: Bronchodilators     Expected Outcomes:  Secretion: Improved breath sound  Lung Expansion: Improved air movement at the bases  Meds: Decreased bronchospasms, Decreased freq of symptoms, Return to baseline home regimen      Re-Evaluation:  Follow-up Date:   Improving with Therapy: Yes    Plan of Care Recommendations:  Plan of Care: Change nebs to PRN

## 2021-03-28 NOTE — Progress Notes (Signed)
PULSE OXIMETRY TESTING:   Document patient's oxygen at rest on room air:   _____% on room air, at rest (No ranges please)   _____% on oxygen, at rest at_____LPM via NC  IF 88% OR BELOW on room air, STOP HERE,   IF NOT ambulate patient on room air with exertion and document below:   _____% on room air, with exertion (must be 88% or below)   _____% on oxygen, with exertion, at_____LPM via NC    All three tests must be during the same session.     Please copy and paste this template,  document saturations in appropriate places, (NO RANGES PLEASE)  in a PROGRESS NOTE.     Testing to qualify for home oxygen must be no earlier than 48 hours prior to discharge, or it will need to be repeated.     Please call [phone] or secure chat when done

## 2021-03-28 NOTE — Progress Notes (Signed)
PACC communicated  RN Orma Render to complete PULSE OXIMETRY TESTING template to process home oxygen and to call or secure chat when done.

## 2021-03-29 LAB — CBC
Absolute NRBC: 0 10*3/uL (ref 0.00–0.00)
Hematocrit: 39.9 % (ref 34.7–43.7)
Hgb: 13.1 g/dL (ref 11.4–14.8)
MCH: 29.4 pg (ref 25.1–33.5)
MCHC: 32.8 g/dL (ref 31.5–35.8)
MCV: 89.7 fL (ref 78.0–96.0)
MPV: 10.2 fL (ref 8.9–12.5)
Nucleated RBC: 0 /100 WBC (ref 0.0–0.0)
Platelets: 354 10*3/uL — ABNORMAL HIGH (ref 142–346)
RBC: 4.45 10*6/uL (ref 3.90–5.10)
RDW: 14 % (ref 11–15)
WBC: 15.78 10*3/uL — ABNORMAL HIGH (ref 3.10–9.50)

## 2021-03-29 LAB — BASIC METABOLIC PANEL
Anion Gap: 7 (ref 5.0–15.0)
BUN: 20 mg/dL — ABNORMAL HIGH (ref 7.0–19.0)
CO2: 27 mEq/L (ref 22–29)
Calcium: 10.4 mg/dL (ref 8.5–10.5)
Chloride: 99 mEq/L — ABNORMAL LOW (ref 100–111)
Creatinine: 0.6 mg/dL (ref 0.6–1.0)
Glucose: 120 mg/dL — ABNORMAL HIGH (ref 70–100)
Potassium: 4.7 mEq/L (ref 3.5–5.1)
Sodium: 133 mEq/L — ABNORMAL LOW (ref 136–145)

## 2021-03-29 LAB — BLOOD GAS, ARTERIAL
Arterial Total CO2: 49.3 mEq/L — ABNORMAL HIGH (ref 24.0–30.0)
Base Excess, Arterial: 0.6 mEq/L (ref <=2.0)
FIO2: 28 %
HCO3, Arterial: 24.8 mEq/L (ref 23.0–29.0)
O2 Flow: 3 L/min
O2 Sat, Arterial: 98.2 % (ref 95.0–100.0)
Temperature: 37
pCO2, Arterial: 40.4 mmHg (ref 35.0–45.0)
pH, Arterial: 7.405 (ref 7.350–7.450)
pO2, Arterial: 114 mmHg — ABNORMAL HIGH (ref 80.0–90.0)

## 2021-03-29 LAB — TSH: TSH: 0.53 u[IU]/mL (ref 0.35–4.94)

## 2021-03-29 LAB — T4, FREE: T4 Free: 0.78 ng/dL (ref 0.70–1.48)

## 2021-03-29 LAB — T3, FREE: T3, Free: 1.77 pg/mL (ref 1.71–3.71)

## 2021-03-29 LAB — GFR: EGFR: >60

## 2021-03-29 NOTE — Progress Notes (Signed)
Pt had runs of vtach 7 beats around 2440, BP taken was 114/75 asymptomatic, Ms Leron Croak PA was notified , no new order made.

## 2021-03-29 NOTE — Progress Notes (Addendum)
CM met with patient to discuss follow-up with her oxygen provider.  [employer] provided patient's oxygen equipment.  Patient has the contact number.  CM advised her to call the company to notify of need and/or repairs needed.  As necessary, CM will facilitate.    10:54AM: CM discussion with Dr. Janith Lima and [facility] Liaison regarding patient's CPAP and oxygen needs.  Dr. Janith Lima informed that patient had a sleep study completed two years ago and indicated those results should still be valid.  [facility] Liaison informed that in November 2021 during patient's hospitalization, DME Liaison facilitated arrangements for patient's CPAP with [facility].  Again, at this time, patient should contact [facility] pertaining to the equipment she has at home.    11:54AM: Above discussed during Sound Rounds.  CM advised to call [facility] on behalf of patient to facilitate the discharge.    Call to [facility], [phone], to follow-up on patient's CPAP equipment.  The representative said that the patient received the CPAP machine in 2019.  The representative said the matter will be "trouble-shooted" and patient will be called.  CM informed of patient's discharge home.    Patient to be updated.    Call to [facility], [phone], to schedule ambulance transport home.  VM left requesting return call.    1:10PM: [facility]MT called CM back.  Patient will be picked-up at 6:00PM.  Dr. Mallie Darting, RN and patient informed.  [facility]MT transport form faxed to [phone].    1:27PM: Dr. Mallie Darting clarified for CM that if patient is unable to receive a new CPAP today, patient cannot be discharged to home.  Dr. Mallie Darting will confirm with Pulmonology and let CM know.    3:42PM: CM Director completed contact and discussions with patient's mother as well as Retail buyer regarding CPAP for patient.  New orders were written for the equipment and faxed to [employer] for expedited insurance authorization for a new  device for patient.  CM will follow.    [facility] ambulance transport canceled.      Memory Argue, MSW, ACM-SW  Social Worker Case Manager [facility]  [phone]

## 2021-03-29 NOTE — Progress Notes (Signed)
PULSE OXIMETRY TESTING:     Document patient's oxygen at rest on room air:   ____93_% on room air, at rest (No ranges please)   ____97_% on oxygen, at rest at_2L_LPM via NC  IF 88% OR BELOW on room air, STOP HERE,   IF NOT ambulate patient on room air with exertion and document below:   ___91__% on room air, with exertion (must be 88% or below)   ___93__% on oxygen, with exertion, at_2L____LPM via NC     All three tests must be during the same session.      Please copy and paste this template,  document saturations in appropriate places, (NO RANGES PLEASE)  in a PROGRESS NOTE.      Testing to qualify for home oxygen must be no earlier than 48 hours prior to discharge, or it will need to be repeated.      Please call [phone] when done

## 2021-03-29 NOTE — Plan of Care (Addendum)
Alert/oriented x4, SR-SB on the monitor, 02 saturation 97% on 2L/ NC, uses CPAP at night,complained of back pain, roxicodone 9m po given q 4hrs as needed,   On prednisone po, on IV abx,pt refused her CPAP at 0300, respiratory therapist made aware, fall precaution is maintained, bed exit alarm is on, with fall mat, call bell within reach.    Problem: Safety  Goal: Patient will be free from injury during hospitalization  Outcome: Progressing  Flowsheets (Taken 03/29/2021 0503)  Patient will be free from injury during hospitalization:  . Assess patient's risk for falls and implement fall prevention plan of care per policy  . Provide and maintain safe environment  . Use appropriate transfer methods  . Ensure appropriate safety devices are available at the bedside  . Include patient/ family/ care giver in decisions related to safety  . Hourly rounding     Problem: Safety  Goal: Patient will be free from infection during hospitalization  Outcome: Progressing  Flowsheets (Taken 03/29/2021 0503)  Free from Infection during hospitalization:  . Assess and monitor for signs and symptoms of infection  . Monitor lab/diagnostic results     Problem: Pain  Goal: Pain at adequate level as identified by patient  Outcome: Progressing  Flowsheets (Taken 03/29/2021 0503)  Pain at adequate level as identified by patient:  . Identify patient comfort function goal  . Assess for risk of opioid induced respiratory depression, including snoring/sleep apnea. Alert healthcare team of risk factors identified.  . Assess pain on admission, during daily assessment and/or before any "as needed" intervention(s)  . Reassess pain within 30-60 minutes of any procedure/intervention, per Pain Assessment, Intervention, Reassessment (AIR) Cycle  . Evaluate if patient comfort function goal is met  . Evaluate patient's satisfaction with pain management progress  . Offer non-pharmacological pain management interventions     Problem: Inadequate Gas  Exchange  Goal: Adequate oxygenation and improved ventilation  Outcome: Progressing  Flowsheets (Taken 03/29/2021 0503)  Adequate oxygenation and improved ventilation:  . Assess lung sounds  . Monitor SpO2 and treat as needed  . Teach/reinforce use of incentive spirometer 10 times per hour while awake, cough and deep breath as needed  . Plan activities to conserve energy: plan rest periods  . Increase activity as tolerated/progressive mobility  . Consult/collaborate with Respiratory Therapy     Problem: Compromised Tissue integrity  Goal: Damaged tissue is healing and protected  Outcome: Progressing  Flowsheets (Taken 03/29/2021 0503)  Damaged tissue is healing and protected:  .Marland KitchenMonitor/assess Braden scale every shift  . Provide wound care per wound care algorithm  . Reposition patient every 2 hours and as needed unless able to reposition self  . Increase activity as tolerated/progressive mobility  . Relieve pressure to bony prominences for patients at moderate and high risk

## 2021-03-29 NOTE — Respiratory Progress Note (Cosign Needed)
Respiratory Therapy                              Patient Re-Assessment Note/Protocol Order Changes    Patient has been assessed and re-evaluated for the follow therapies:       Respiratory Orders   (From admission, onward)             Start     Ordered    03/28/21 2100  CPAP continuous-not intubated  QHS (RT)      Question Answer Comment   PEEP 7    FLOW Start at: (Liters per minute) 2        03/28/21 1209    03/28/21 0913  MDI/DPI  Every morning (RT)      Comments:   All Adult patients ordered for Respiratory Therapy, i.e., inhaled meds, secretion clearance/lung expansion or Oxygen greater than 5 liters/min will be evaluated by a Respiratory Therapist and assessed per Respiratory Therapy Patient Driven Protocol.  Initial assessment and changes made per protocol can be found in the progress note section of the patient chart.    03/28/21 0912    03/25/21 2000  Nebulizer treatment intermittent  2 times daily (RT)      Comments:   All Adult patients ordered for Respiratory Therapy, i.e., inhaled meds, secretion clearance/lung expansion or Oxygen greater than 5 liters/min will be evaluated by a Respiratory Therapist and assessed per Respiratory Therapy Patient Driven Protocol.  Initial assessment and changes made per protocol can be found in the progress note section of the patient chart.    03/25/21 1048    03/23/21 0800  Resp Re-Assess Adult (RT Use Only)  2 times daily (RT)       03/23/21 1155              IP Meds - Nasal and Inhaled (From admission, onward)            Start     Stop Status Route Frequency Ordered    03/28/21 2000  albuterol-ipratropium (DUO-NEB) 2.5-0.5(3) mg/3 mL nebulizer 3 mL         -- Dispensed NEBULIZATION RT - 2 times daily 03/28/21 1255    03/28/21 0945  fluticasone furoate-vilanterol (BREO ELLIPTA) 200-25 MCG/INH 1 puff         -- Dispensed IN RT - Every morning 03/28/21 0901               Current Criteria For Therapy  Secretion  Clearance: Other (Comment)  Lung Expansion: None indicated  Medications: Hx of COPD, Asthma, Bronchitis or other documented RAD    Expected Outcomes  Secretion: Improved breath sound   Lung Expansion: Improved air movement at the bases   Meds: Decreased bronchospasms, Decreased freq of symptoms, Return to baseline home regimen    Outcomes Met        Meds: No       Reassessment Recommendations  Recommendations: Continue current treatment plan    Patient's orders have been modified as follows per RT Patient Driven Protocol.      If any questions, please contact the Respiratory Therapist assigned to this patient    Thank you

## 2021-03-29 NOTE — Progress Notes (Signed)
PULMONARY PROGRESS NOTE                                                                                                              [phone]    Date Time: 03/29/21 1:38 PM  Patient Name: [name], [name] 50 y.o. female admitted with SOB (shortness of breath)  Admit Date: 03/22/2021    Patient status: Inpatient  Hospital Day: 7           Assessment:     . Acute exacerbation of chronic obstructive pulmonary disease improved  . Hypoxic respiratory insufficiency  . Morbid obesity  . Obstructive sleep apnea diagnosed by sleep study 2 years ago has a CPAP at home which is broken  . Pulmonary hypertension secondary to COPD  . Tobacco dependence    Plan:       . Arrange for home oxygen  . Arrange for home CPAP  . If patient does not receive CPAP in next 24 to 48-hour due to his morbid obesity and sleep apnea her condition may deteriorate  . Avoid narcotics  . Keep oxygen saturation 88 to 91%  . Patient is refusing to go to SNF      Subjective:       No new complaints patient is seen drowsy during the morning hours            Medications:     Current Facility-Administered Medications   Medication Dose Route Frequency   . albuterol-ipratropium  3 mL Nebulization BID   . cefTRIAXone  1 g Intravenous Q24H   . cetirizine  10 mg Oral Daily   . famotidine  20 mg Oral BID   . fluticasone furoate-vilanterol  1 puff Inhalation QAM   . heparin (porcine)  5,000 Units Subcutaneous Q8H [place]   . indomethacin  50 mg Oral BID AC   . lidocaine  1 patch Transdermal Q24H   . montelukast  10 mg Oral QHS   . nicotine  1 patch Transdermal Daily   . oxybutynin  10 mg Oral TID   . predniSONE  50 mg Oral QAM W/BREAKFAST   . risperiDONE  3 mg Oral BID   . sertraline  100 mg Oral BID       Review of Systems:        General ROS:  Afebrile    ENT ROS:  No sore throat no nasal discharge   Endocrine ROS:   fatigue   Respiratory ROS:  No shortness of breath wheezing cough chest congestion    Cardiovascular ROS:  No chest pain or  palpitation   Gastrointestinal ROS:  No nausea vomiting diarrhea.  No melanotic stool   Genito-Urinary ROS:  No burning in the urine or hematuria   Musculoskeletal ROS:  No musculoskeletal deformities   Neurological ROS:  No stroke seizure disorder   Dermatological ROS:  No skin rash        Physical Exam:     Vitals:    03/29/21 1140   BP: 111/67   Pulse: 78  Resp: 16   Temp: 98.1 F (36.7 C)   SpO2: 95%         Intake/Output Summary (Last 24 hours) at 03/29/2021 1338  Last data filed at 03/29/2021 0935  Gross per 24 hour   Intake 1020 ml   Output 2950 ml   Net -1930 ml           General appearance -morbidly obese   mental status -  Alert and oriented x3  Eyes - EOMI PERRLA  Nose - no nasal discharge  Mouth - mucous membrane is moist.    Neck - no JVD lymphadenopathy or thyromegaly  Chest - clear to auscultation  Heart - S1-S2 RRR no S3-S4 no murmur  Abdomen - soft nontender bowel sounds are normal with no hepatosplenomegaly  Neurological - no motor or sensory deficit  Extremities - no edema clubbing or cyanosis  Skin - no skin rash      Labs:     CBC w/Diff CMP   Recent Labs   Lab 03/29/21  0401 03/28/21  0359 03/27/21  0641 03/23/21  0629 03/22/21  1949   WBC 15.78* 15.44* 12.34*  More results in Results Review 10.78*   Hgb 13.1 12.4 12.2  More results in Results Review 12.1   Hematocrit 39.9 38.2 37.6  More results in Results Review 37.0   Platelets 354* 368* 339  More results in Results Review 283   MCV 89.7 90.3 90.0  More results in Results Review 88.9   Neutrophils  --   --   --   --  67.9   More results in Results Review = values in this interval not displayed.       PT/INR   Recent Labs   Lab 03/23/21  0306   PT INR 0.9       Recent Labs   Lab 03/29/21  0401 03/28/21  0359 03/27/21  0641 03/23/21  0306 03/22/21  1949   Sodium 133* 134* 136  More results in Results Review 137   Potassium 4.7 4.9 4.7  More results in Results Review 4.5   Chloride 99* 100 102  More results in Results Review 105   CO2 _0 More results in Results Review 23   BUN 20.0* 17.0 17.0  More results in Results Review 9.0   Creatinine 0.6 0.6 0.6  More results in Results Review 0.6   Glucose 120* 119* 150*  More results in Results Review 114*   Calcium 10.4 9.9 10.3  More results in Results Review 9.2   Protein, Total  --   --   --   --  5.5*   Albumin  --   --   --   --  2.9*   AST (SGOT)  --   --   --   --  13   ALT  --   --   --   --  10   Alkaline Phosphatase  --   --   --   --  96   Bilirubin, Total  --   --   --   --  0.5   More results in Results Review = values in this interval not displayed.      Glucose POCT   Recent Labs   Lab 03/29/21  0401 03/28/21  0359 03/27/21  0641 03/26/21  0404 03/25/21  0430 03/24/21  0805 03/23/21  0306   Glucose 120* 119* 150* 150* 163* 139* 161*  Recent Labs   Lab 03/23/21  1704 03/23/21  0306 03/22/21  1949   Troponin I <0.01 <0.01 <0.01         ABGs:    ABG CollectionSite   Date Value Ref Range Status   03/28/2021 Left Radl  Final     Allen's Test   Date Value Ref Range Status   03/28/2021 Yes  Final     pH, Arterial   Date Value Ref Range Status   03/28/2021 7.398 7.350 - 7.450 Final     pCO2, Arterial   Date Value Ref Range Status   03/28/2021 44.3 35.0 - 45.0 mmHg Final     pO2, Arterial   Date Value Ref Range Status   03/28/2021 88.6 80.0 - 90.0 mmHg Final     HCO3, Arterial   Date Value Ref Range Status   03/28/2021 26.8 23.0 - 29.0 mEq/L Final     Base Excess, Arterial   Date Value Ref Range Status   03/28/2021 2.0 -2.0 - 2.0 mEq/L Final     O2 Sat, Arterial   Date Value Ref Range Status   03/28/2021 96.3 95.0 - 100.0 % Final       Urinalysis  Recent Labs   Lab 03/23/21  1933   Urine Type Urine, Clean Ca   Color, UA Yellow   Clarity, UA Hazy   Specific Gravity UA 1.009   Urine pH 7.0   Nitrite, UA Negative   Ketones UA Negative   Urobilinogen, UA Negative   Bilirubin, UA Negative   Blood, UA Small*   RBC, UA 0 - 2   WBC, UA 0 - 5         Rads:   No results found.      Quillian Quince  MD  03/29/2021  1:38 PM

## 2021-03-29 NOTE — Progress Notes (Signed)
Follow up call placed to [place], [place], option 2, spoke with rep Chevon.    Per Chevon, pt on service since 2019 with CPAP. Last supplies in 2019. Per [employer] plan does not required [employer]Med compliance and use report download. I asked this question to confirm last contact regarding requesting supplies, or advising machine not working.     Per Chevon pt is not on service for home o2.      Per Chevo n need a written order for CPAP machine and supplies. Send via fax and label urgent to fax [phone].    1536:  Dr Mallie Darting completed order / it was faxed and confirmed by this writer with [employer].      16:00:    Pulmonary notes 03/28/21 and 03/29/21 faxing for supporting documents for auth initiation for new machine.      16:50 spoke with Estill Bamberg at [facility] r/t returning her call.  Went to pt's bedside to rev iew cpap settings and spoke with pt while on the phone with Estill Bamberg.      Per Estill Bamberg she will send over their written order form to our fax ( will check call pilot).     Once compliant order received the order and supporting documents will be submitted for auth for replacement CPAP machine.  Per reps the pt has had current machine less than 5 years. They will attempt auth. If not Josem Kaufmann it will likely be private pay.    CM wi ll follow up to send CPAP order in the a.m. or the [facility] ordering doc.    Updated Dr Mallie Darting and team via epic chat regarding the above.    Penny Pia., RN  Director, care management   [phone]

## 2021-03-29 NOTE — Progress Notes (Addendum)
Per oxygen template done, pt does not qualify for home oxygen. Pt stated she is already active with [facility] for home oxygen and she is on 2 L NC continuous at home. Patient stated her CPAP machine is broken and her insurance denied her for replacement. Update given to CM [place].

## 2021-03-29 NOTE — Progress Notes (Signed)
[facility]  PROGRESS NOTE      Patient: [name]  Date: 03/29/2021   LOS: 7 Days  Admission Date: 03/22/2021   MRN: [id number]  Attending: Dr. Romana Juniper Blair Hailey, PA-C  Please contact me on secure chat       ASSESSMENT/PLAN     [name] is a 50 y.o. female with a PMHx of tobacco dependence, chronic respiratory failure secondary to COPD (O2 dependent on 2 L via NC), history of psychosis, anxiety/depression, OSA (not using CPAP), morbid obesity, and chronic back pain admitted with acute COPD exacerbation, interstitial pneumonitis.      Interval Summary:   Patient  Has a history of chronic respiratory failure on 2L home O2 due to COPD and morbid obesity, untreated sleep apnea, and presented with acute on chronic respiratory failure and distress. Lung findings significant for rhonchi, wheezing, and crackles with increased work of breathing for much of her hospital stay, gradually improving. 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 Patient has sleep apnea worsened by her home pain meds. Pain meds have been decreased. She needs sleep study. Pulmonary was consulted, recommending antibiotics and steroids, outpatient pulm followup. Today steroids were switched to oral, and I have asked CM to look for SNF as still significant DOE as well as daytime somnolence due to needing cpap and weaning of pain meds. Imaging on 5/8 shows continued infiltrates, mildly improved.    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  Enlarged main pulmonary trunk suggestive of underlying pulmonary arterial hypertension.  CTA chest without definite evidence of PE, again notes bilateral ground glass infiltrates.  CXR 5/8: persistent mildly improved air space disease  -Status post IV Solumedrol , continue with oral prednisone  -Titrate oxygen to maintain O2 saturations 88 to 92%  -Ceftazidime changed to azithromycin + ceftriaxone  -Continue DuoNebs  -Antitussives as needed  -BNP and serial troponins wnl, Echocardiogram EF 60-65%, mild TR, moderate pulm htn  -ESR and CRP elevated, ANA and Rh negative. HIV non-reactive.  -Sputum culture contaminated, RVP negative  -Appreciate pulmonology evaluation, likely nonspecific interstitial pneumonitis.   -smoking cessation  - Discussed with Dr Janith Lima     OSA, untreated  -Patient with history of obstructive sleep apnea diagnosed by sleep study 3 years ago.  Patient was compliant with CPAP at home until it broke  -Patient is discharged however awaiting to receive CPAP prior to discharge  -As per pulmonology "If patient does not receive CPAP in next 24 to 48-hour due to his morbid obesity and sleep apnea her condition may deteriorate"    Hyperkalemia  -Improved with kayexalate  -Daily BMP    Altered mental status, labile  Hypercarbia   -Patient with increased somnolence in the morning that improves by the afternoon. Suspect secondary to CO2 retention overnight, worsened by oxycodone/gabapentin.    -Continue with CPAP at nighttime    Chronic back pain  -Continue home indomethacin. Continue gabapentin and oxycodone at lower dose     Morbid obesity BMI 54  -Lifestyle modification with diet and exercise on discharge    Anxiety/depression  -Continue home Xanax, risperidone, and sertraline. Atomoxetine non-formulary, resume on discharge  Tobacco dependence  -Cessation advised, continue nicotine patch. Will need prescription on discharge    GERD  -Pepcid     Dysuria  -Trace LE, continue abx as above      Analgesia: Tylenol, oxycodone, gabapentin    Nutrition: regular    DVT Prophylaxis:Heparin        Code Status: Full    DISPO: Likely home with home health once CPAP can be arranged        SUBJECTIVE     [name] is somnolent again this am. Wakes up to verbal and past evaluation but then quickly back to sleep.       MEDICATIONS     Current Facility-Administered Medications   Medication Dose Route Frequency   . albuterol-ipratropium  3 mL Nebulization BID   . cefTRIAXone  1 g Intravenous Q24H   . cetirizine  10 mg Oral Daily   . famotidine  20 mg Oral BID   . fluticasone furoate-vilanterol  1 puff Inhalation QAM   . heparin (porcine)  5,000 Units Subcutaneous Q8H Gilmer   . indomethacin  50 mg Oral BID AC   . lidocaine  1 patch Transdermal Q24H   . montelukast  10 mg Oral QHS   . nicotine  1 patch Transdermal Daily   . oxybutynin  10 mg Oral TID   . predniSONE  50 mg Oral QAM W/BREAKFAST   . risperiDONE  3 mg Oral BID   . sertraline  100 mg Oral BID       PHYSICAL EXAM     Vitals:    03/29/21 1852   BP: 125/85   Pulse: 73   Resp: 20   Temp: 98.4 F (36.9 C)   SpO2: 98%       Temperature: Temp  Min: 97.5 F (36.4 C)  Max: 98.8 F (37.1 C)  Pulse: Pulse  Min: 57  Max: 78  Respiratory: Resp  Min: 16  Max: 20  Non-Invasive BP: BP  Min: 102/68  Max: 136/87  Pulse Oximetry SpO2  Min: 94 %  Max: 98 %    Intake and Output Summary (Last 24 hours) at Date Time    Intake/Output Summary (Last 24 hours) at 03/29/2021 1915  Last data filed at 03/29/2021 1816  Gross per 24 hour   Intake 1560 ml   Output 2700 ml   Net -1140 ml       GEN APPEARANCE: Somnolent, NAD.  HEENT: PERLA; EOMI; Conjunctiva Clear  NECK: Supple; No bruits  CVS: RRR, S1, S2; No M/G/R  LUNGS: Coarse breath sounds bilaterally- basilar crackles  ABD: Obese abdomen, Soft; No TTP; + Normoactive BS  EXT: No edema; Pulses 2+ and intact  Skin exam:  no pallor  NEURO: CN 2-12 intact; No Focal neurological deficits  CAP REFILL:  Normal  MENTAL STATUS:  Altered      LABS     Recent Labs   Lab 03/29/21  0401 03/28/21  0359 03/27/21  0641   WBC 15.78* 15.44*  12.34*   RBC 4.45 4.23 4.18   Hgb 13.1 12.4 12.2   Hematocrit 39.9 38.2 37.6   MCV 89.7 90.3 90.0   Platelets 354* 368* 339       Recent Labs   Lab 03/29/21  0401 03/28/21  0359 03/27/21  0641 03/26/21  0404 03/25/21  0430   Sodium 133* 134* 136 137 136   Potassium 4.7 4.9 4.7 4.2 5.4*   Chloride 99* 100 102 100 103  CO2 _0 BUN 20.0* 17.0 17.0 13.0 9.0   Creatinine 0.6 0.6 0.6 0.6 0.6   Glucose 120* 119* 150* 150* 163*   Calcium 10.4 9.9 10.3 10.2 9.0       Recent Labs   Lab 03/22/21  1949   ALT 10   AST (SGOT) 13   Bilirubin, Total 0.5   Albumin 2.9*   Alkaline Phosphatase 96       Recent Labs   Lab 03/23/21  1704 03/23/21  0306 03/22/21  1949   Troponin I <0.01 <0.01 <0.01       Recent Labs   Lab 03/23/21  0306   PT INR 0.9   PT 10.2       Microbiology Results (last 15 days)     Procedure Component Value Units Date/Time    Respiratory Pathogen Panel w/COVID-19, PCR [[id number]] Collected: 03/24/21 1307    Order Status: Completed Specimen: Nasopharyngeal Updated: 03/25/21 0512     Adenovirus Not Detected     Coronavirus 229E Not Detected     Coronavirus HKU1 Not Detected     Coronavirus NL63 Not Detected     Coronavirus OC43 Not Detected     SARS CoV-2 RNA Not Detected     Human Metapneumovirus Not Detected     Human Rhinovirus/Enterovirus Not Detected     Influenza A Not Detected     Influenza A/H1 Not Detected     Influenza AH1 - 2009 Not Detected     Influenza A/H3 Not Detected     Influenza B Not Detected     Parainfluenza Virus 1 Not Detected     Parainfluenza Virus 2 Not Detected     Parainfluenza Virus 3 Not Detected     Parainfluenza Virus 4 Not Detected     Respiratory Syncytial Virus Not Detected     Bordetella pertussis Not Detected     Bordetella parapertussis PCR Not Detected     Chlamydophila pneumoniae Not Detected     Mycoplasma pneumoniae Not Detected     Source NP Swab     Test Comment for Respiratory Panel with COVID See Below     Comment: Testing performed using the [employer]  FilmArray  Respiratory Panel (RP 2.1)  This test is for the qualitative detection of  respiratory pathogen nucleic acid. This assay cannot  differentiate between Rhinovirus/Enterovirus. If  necessary for patient care, a positive result for  Rhinovirus/Enterovirus may be followed-up using an  alternate method. This assay should not be used if  B. pertussis infection is specifically suspected as it  is less sensitive than other alternatives. If suspected,  ensure that B. pertussis specific testing is ordered.  Recent administration of a nasal influenza vaccine may  cause false positive results for Influenza A and/or  Influenza B.  Viral and bacterial nucleic acids may persist even  though no viable organism is present. Detection of  nucleic acid does not imply that the corresponding  organisms are infectious or are the causative agents of  clinical symptoms. Positive results of this test do not  rule out coinfection with other organisms. A negative  result does not exclude the possibility of viral or  bacterial infection.  Assay performance characteristics  may vary with circulating strains and this assay may not  be able to distinguish between existing viral strains  and new variants as they emerge. Results of this test  should not be used as the sole basis for diagnosis,  treatment, or other patient management decisions.  This assay is FDA authorized for nasopharyngeal swab  samples.  Performance characteristics for  Bronchoalveolar lavage samples have been determined by  the [facility] laboratory. Other sample types are unacceptable.  Invalid results may be due to inhibiting substances in  the specimen and recollection should occur.          Purpose of COVID testing Diagnostic -PUI    Narrative:      For NP, Call laboratory for VTM NP Swab collection device.  Bronchoalveolar Lavage is submitted in a sterile container.  Diagnostic -PUI    CULTURE + Lacretia Leigh [[id number]] Collected: 03/23/21 2208     Order Status: Completed Specimen: Sputum, Expectorated Updated: 03/24/21 0620    Narrative:      Results Faxed [phone], by [id number] on 03/24/2021 at 06:20  Culture and Gram Stain, Aerobic, Respiratory was cancelled on 03/24/21 at  06:20 by [phone]; Specimen quality is inadequate for culture  Specimen appears  to be saliva  Please submit another sputum specimen  Induction of sputum may  improve specimen quality  ORDER#: [id number]                                    ORDERED BY: JABBAR, LUBNA  SOURCE: Sputum, Expectorated as below                COLLECTED:  03/23/21 22:08  ANTIBIOTICS AT COLL.:                                RECEIVED :  03/24/21 01:47  Results Faxed [phone], [place] [id number] on 03/24/2021 at 06:20  Culture and Gram Stain, Aerobic, Respiratory was cancelled on 03/24/21 at 06:20 by [id number]; Specimen quality is inadequate for  culture  Specimen appears to be saliva  Please submit another sputum specimen  Induction of sputum may improve specimen quality  Stain, Gram (Respiratory)                  FINAL       03/24/21 06:20  03/24/21   Smear contains > or = 10 squamous epithelial cells per low             power field, suggestive of significant oral contamination and             poor quality specimen. Culture not performed.             Please recollect if clinically indicated.  Culture and Gram Stain, Aerobic, RespiratorCANCELLED   03/24/21 06:20            - cancelled on 03/24/21 06:20   by  [id number]   Specimen quality is inadequate for culture  Specimen appears to be saliva   Please submit another sputum specimen  Induction of sputum may improve specimen   quality      Culture Blood Aerobic and Anaerobic [[id number]] Collected: 03/22/21 2349    Order Status: Completed Specimen: Arm from Blood, Venipuncture Updated: 03/28/21 0821    Narrative:      ORDER#: [id number]                                    ORDERED BY: HE, ALBERT  SOURCE: Blood, Venipuncture Arm  COLLECTED:  03/22/21  23:49  ANTIBIOTICS AT COLL.:                                RECEIVED :  03/23/21 05:29  Culture Blood Aerobic and Anaerobic        FINAL       03/28/21 08:21  03/28/21   No growth after 5 days of incubation.      Culture Blood Aerobic and Anaerobic [[id number]] Collected: 03/22/21 2349    Order Status: Completed Specimen: Arm from Blood, Venipuncture Updated: 03/28/21 0821    Narrative:      ORDER#: [id number]                                    ORDERED BY: HE, ALBERT  SOURCE: Blood, Venipuncture Arm                      COLLECTED:  03/22/21 23:49  ANTIBIOTICS AT COLL.:                                RECEIVED :  03/23/21 05:29  Culture Blood Aerobic and Anaerobic        FINAL       03/28/21 08:21  03/28/21   No growth after 5 days of incubation.      COVID-19 (SARS-CoV-2) and Influenza A/B, NAA (Liat Rapid)- Admission [[id number]] Collected: 03/22/21 2227    Order Status: Completed Specimen: Culturette from Nasopharyngeal Updated: 03/22/21 2304     Purpose of COVID testing Diagnostic -PUI     SARS-CoV-2 Specimen Source Nasal Swab     SARS CoV 2 Overall Result Not Detected     Comment: __________________________________________________  -A result of "Detected" indicates POSITIVE for the    presence of SARS CoV-2 RNA  -A result of "Not Detected" indicates NEGATIVE for the    presence of SARS CoV-2 RNA  __________________________________________________________  Test performed using the Roche cobas Liat SARS-CoV-2 assay. This assay is  only for use under the Food and Drug Administration's Emergency Use  Authorization. This is a real-time RT-PCR assay for the qualitative  detection of SARS-CoV-2 RNA. Viral nucleic acids may persist in vivo,  independent of viability. Detection of viral nucleic acid does not imply the  presence of infectious virus, or that virus nucleic acid is the cause of  clinical symptoms. Negative results do not preclude SARS-CoV-2 infection and  should not be used as the sole basis for diagnosis,  treatment or other  patient management decisions. Negative results must be combined with  clinical observations, patient history, and/or epidemiological information.  Invalid results may be due to inhibiting substances in the specimen and  recollection should occur. Please see Fact Sheets for patients and providers  located:  https://www.benson-chung.com/          Influenza A Not Detected     Influenza B Not Detected     Comment: Test performed using the Roche cobas Liat SARS-CoV-2 & Influenza A/B assay.  This assay is only for use under the Food and Drug Administration's  Emergency Use Authorization. This is a multiplex real-time RT-PCR assay  intended for the simultaneous in vitro qualitative detection and  differentiation of SARS-CoV-2, influenza A, and influenza B virus RNA. Viral  nucleic acids may persist  in vivo, independent of viability. Detection of  viral nucleic acid does not imply the presence of infectious virus, or that  virus nucleic acid is the cause of clinical symptoms. Negative results do  not preclude SARS-CoV-2, influenza A, and/or influenza B infection and  should not be used as the sole basis for diagnosis, treatment or other  patient management decisions. Negative results must be combined with  clinical observations, patient history, and/or epidemiological information.  Invalid results may be due to inhibiting substances in the specimen and  recollection should occur. Please see Fact Sheets for patients and providers  located: http://olson-hall.info/.         Narrative:      o Collect and clearly label specimen type:  o PREFERRED-Upper respiratory specimen: One Nasal Swab in  Transport Media.  o Hand deliver to laboratory ASAP  Diagnostic -PUI           RADIOLOGY     CT Chest without Contrast    Result Date: 03/22/2021  Patchy groundglass like opacities following the bronchovascular bundles throughout the lung fields bilaterally, have increased from the prior  study. Differential diagnosis includes but is not limited to NSIP, hypersensitivity and drug induced pneumonitis, and atypical pneumonia. Stable mild cylindrical bronchiectasis, with parenchymal scarring and honeycombing within the lung apices. Enlarged main pulmonary trunk is suggestive of underlying pulmonary arterial hypertension. Status post cholecystectomy. Timmie Foerster MD, MD  03/22/2021 11:02 PM    CT Angiogram Chest    Result Date: 03/23/2021   There is suboptimal opacification of the pulmonary arteries particularly in the upper lobes. No definite pulmonary embolism is noted. Bilateral groundglass infiltrates Steward Drone, MD  03/23/2021 10:48 PM    XR Chest AP Portable    Result Date: 03/27/2021   Persistent but mildly improved interstitial and airspace disease. Stable cardiomegaly. [name], MD  03/27/2021 8:38 AM    XR Chest AP Portable    Result Date: 03/24/2021   Progressive diffuse lung disease Elyn Peers, MD  03/24/2021 10:20 AM    Chest AP Portable    Result Date: 03/22/2021   Pulmonary edema. [name], MD  03/22/2021 7:44 PM      Signed,  Marya Landry, MD  7:15 PM 03/29/2021

## 2021-03-29 NOTE — Plan of Care (Signed)
Pt was alert and oriented x4. VSS. Sinus brady on tele. On 2L NC saturated at 97%. Dyspnea on exertion. Complain of pain in the lower back. Oxycodone 75m was given per order. Possible discharge home with CPAP machine. All safety measures were in place. Purposeful rounding done.    Problem: Moderate/High Fall Risk Score >5  Goal: Patient will remain free of falls  Outcome: Progressing     Problem: Safety  Goal: Patient will be free from injury during hospitalization  Outcome: Progressing  Goal: Patient will be free from infection during hospitalization  Outcome: Progressing     Problem: Pain  Goal: Pain at adequate level as identified by patient  Outcome: Progressing     Problem: Side Effects from Pain Analgesia  Goal: Patient will experience minimal side effects of analgesic therapy  Outcome: Progressing     Problem: Discharge Barriers  Goal: Patient will be discharged home or other facility with appropriate resources  Outcome: Progressing     Problem: Psychosocial and Spiritual Needs  Goal: Demonstrates ability to cope with hospitalization/illness  Outcome: Progressing     Problem: Compromised Hemodynamic Status  Goal: Vital signs and fluid balance maintained/improved  Outcome: Progressing     Problem: Inadequate Gas Exchange  Goal: Adequate oxygenation and improved ventilation  Outcome: Progressing     Problem: Compromised Tissue integrity  Goal: Damaged tissue is healing and protected  Outcome: Progressing  Goal: Nutritional status is improving  Outcome: Progressing

## 2021-03-29 NOTE — Progress Notes (Signed)
Home Health Referral                                                                            Referral from [facility] (Case Manager) for home health care upon discharge.    By [employer], the patient has the right to freely choose a home care provider.  Arrangements have been made with:     A company of the patients choosing. We have supplied the patient with a listing of providers in your area who asked to be included and participate in Medicare.   [facility], formerly [place], a home care agency that provides adult home care services and participates in Medicare   The preferred provider of your insurance company. Choosing a home care provider other than your insurance company's preferred provider may affect your insurance coverage.      Home Health Discharge Information    Your doctor has ordered Skilled Nursing in-home service(s) for you while you recuperate at home, to assist you in the transition from hospital to home.      The agency that you or your representative chose to provide the service:  Name of [place] Placement:  (TBD)]      The above services were set up by:  Cecille Amsterdam, RN  [facility] Liaison)   Phone  [phone]    Additional comments:     IF YOU HAVE NOT HEARD [place] 24-48 HOURS AFTER DISCHARGE PLEASE CALL YOUR AGENCY TO ARRANGE A TIME FOR YOUR FIRST VISIT. FOR ANY SCHEDULING CONCERNS OR QUESTIONS RELATED TO HOME HEALTH, SUCH AS TIME OR DATE PLEASE CONTACT YOUR HOME HEALTH AGENCY AT THE NUMBER LISTED ABOVE.    HOME HEALTH REFERRAL    PATIENT DEMOGRAPHICS:        Name: [name]    Discharge Address: [address] [place] [address]      Primary Telephone Number: [phone] cell  Secondary Telephone Number: [phone] mother  Emergency Contact and Number: Extended Emergency Contact Information  Primary Emergency Contact: [name]  Mobile Phone: [phone]  Relation:  Mother    Ordering Physician: Marya Landry, MD    PCP: Virgel Bouquet, MD, [phone]    Following Physician:  Follow up with Vassie Moment, MD in 1 week(s)  [facility], [facility]  [address]   [place] [address]  [phone]       Agreeable to Follow: Yes  Date/Time of Call:     Language/Communication Barrier:       No    Primary Diagnosis and Reason for Services:   SOB (shortness of breath) R06.02    a 50 y.o. 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      Patient states that she is having shortness of breath since this morning, associated with wheezing, cough (unable to cough out mucus) and chest tightness.  Patient also reports seasonal allergies, admits to smoking  1 pack of cigarettes daily, denies pets at home.  Patient states that she is not COVID vaccinated.  Patient denies prior intubation, however admitted with COPD exacerbation in November, 2021.  Patient states that she takes Xanax, high dose of gabapentin 800 mg 3 times daily and high dose of Percocet for back pain daily. Patient denies fever, chills, palpitation, nausea, vomiting, change in urinary/bowel habit.     [employer] (Labs, Wounds, Infusions, etc.):  Has home oxygen ROC with [employer]    Additional Comments:  SOC CALL IS NEEDED    COVID-19 Screening:  SARS CoV-2 RNA  Respiratory Pathogen Panel w/COVID-19, PCR  Collected: 03/24/21 1307   Result status: Final   Resulting lab: [facility]   Value: Not [place] face-to-face (FTF) Encounter (Order [id number])  Consult  Date: 03/29/2021 Department: [address] Ordering/Authorizing: Marya Landry, MD           Order Information    Order Date/Time Release Date/Time Start Date/Time End Date/Time    03/29/21 12:11 PM None 03/29/21 12:10 PM 03/29/21 12:10 PM       Order Details    Frequency Duration Priority Order Class   Once 1 occurrence Routine Hospital Performed               Standing Order Information    Remaining Occurrences Interval Last Released     0/1 Once 03/29/2021                 Provider Information    Ordering User Ordering Provider Authorizing Provider   Cecille Amsterdam, RN Marya Landry, MD Marya Landry, MD   Attending Provider(s) Admitting Provider PCP   He, Devin Going, MD PhD; Sheppard Coil, MD; Dolphus Jenny, MD; Marya Landry, MD Sheppard Coil, MD Virgel Bouquet, MD     Verbal Order Info    Action Created on Order Mode Entered by Responsible Provider Signed by Signed on   Ordering 03/29/21 1211 Telephone with Jacqulyn Liner, RN Marya Landry, MD             Comments    SOB (shortness of breath) R06.02   Home nursing required for skilled assessment including cardiopulmonary assessment and dietary education for disease management, and medication instruction.             Home Health face-to-face (FTF) Encounter: Patient Communication    Not Released Not seen         Order Questions    Question Answer   Date I saw the patient face-to-face: 03/29/2021   Evidence this patient is homebound because: C. Decreased endurance, strength, ROM, cadence, safety/judgment during mobility    G. Fall risk due to impaired coordination, gait and decreased balance    N. Impaired mobility d/t pain, arthritis, weakness that compromises patient safety   Medical conditions that necessitate Home Health care: B. Functional impairment due to recent hospitalization/procedure/treatment    C. Risk for complication/infection/pain requiring follow up and monitoring    D. Chronic illness & risk for re-hospitalization due to unstable disease status    E. Exacerbation of disease requiring follow up monitoring    F. New diagnosis & treatment requiring follow up monitoring and management    Per clinical findings, following services are medically necessary: Skilled Nursing   Clinical findings that support the need for Skilled Nursing. SN will: C. Monitor for signs and symptoms of  exacerbation of disease and management    D. Review medication reconciliation, manage and educate on use and side effects    G. Educate on new diagnosis, treatment & management to prevent re-hospitalization    H. Assess cardiopulmonary status and monitor for signs &symptoms of exacerbation    I. Educate dietary and or fluid restrictions and weight management    N. Instruct on oxygen use and safety   Other (please specify) Salih,Ibraham,MDm,PCP                   Process Instructions    Please select Home Care Services medically necessary.     Based on the above findings, I certify that this patient is confined to the home and needs intermittent skilled nursing care, physical therapry and / or speech therapy or continues to need occupational therapy. The patient is under my care, and I have initiated the establishment of the plan of care. This patient will be followed by a physician who will periodically review the plan of care.      Collection Information            Consult Order Info    ID Description Priority Start Date Start Time   [id number] [place] face-to-face (FTF) Encounter Routine 03/29/2021 12:10 PM   Provider Specialty Referred to   ______________________________________ _____________________________________         Verbal Order Info    Action Created on Order Mode Entered by Responsible Provider Signed by Signed on   Ordering 03/29/21 1211 Telephone with Jacqulyn Liner, RN Marya Landry, MD             Patient Information    Patient Name   [name], [name] Legal Sex   Female DOB   Nov 07, 1971       Additional Information    Associated Reports External References   Priority and Order Details [place]        Patient Name: LILLIEN, PETRONIO     MRN: [id number]     CSN: [id number]       [place] #   [id number] Patient Class   Inpatient Service  Cardiology Accommodation Code  Semi-Private     Admission Information    Admitting Physician:  Attending Physician: Sheppard Coil, MD  Marya Landry, MD Unit  [address] [facility] L&D Status     Admitting Diagnosis: SOB (shortness of breath) Room / Bed  A2112/A2112-A L&D - Last Menstrual Cycle     Chief Complaint: Shortness of Breath; Che*     Admit Type:  Admit Date/Time:  Discharge Date/Time: Emergency  03/22/2021 / 1859   /  Length of Stay: 7 Days   L&D EDD   Estimated Date of Delivery: None noted.     Patient Information            Home Address: [address] [place] [address] Employer:  Employer Address:     ,     Main Phone: [phone] Employer Phone:    SSN: [id number]     DOB: January 18, 1971 (58 yrs)     Sex: Female Primary Care Physician: Virgel Bouquet, MD   Marital Status: Single Referring Physician:       No ref. provider found   Race: White or Caucasian     Ethnicity: Non  Hispanic/Latino     Emergency Contacts  Name Home Phone Work Phone Mobile Phone Relationship Cecille Rubin   [place], [place] Mother         Guarantor Information    Guarantor Name: DAEJA, HELDERMAN ID: [id number]   Guarantor Relationship to Pt: Self Guarantor Type: Personal/Family   Guarantor DOB:   15-May-1971     Guarantor Address: [address] 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 Emp Phone:               [employer]    Insurance Name: [place] Name: [employer]   Insurance Address:   [address]  [place], [place] Subscriber DOB: Jan 14, 1971     Subscriber ID: [id number]   Insurance Phone: [phone] Pt Relationship to Sub:   Self   Insurance ID:       Group Name:  Preauthorization #: Pending   Group #: [id number] Preauthorization Days:      Agricultural engineer Name: - [place] Name:    Insurance underwriter Address:     ,   Veterinary surgeon DOB:      Subscriber ID:    Veterinary surgeon:  Pt Relationship to Sub:      Insurance ID:      Group Name:  Preauthorization #:    Group #:  Preauthorization Days:      [facility] Name: - [place] Name:    Insurance underwriter Address:     ,   Veterinary surgeon DOB:      Subscriber ID:    Veterinary surgeon:  Pt Relationship to Sub:      Insurance ID:      Group Name:  Preauthorization #:    Group #:  Preauthorization Days:        03/29/2021 12:13 PM                     Discharge Date:03/29/21  Referral Source (PACC/Hospital/Unit): Cecille Amsterdam, RN  Referral Date: 03/29/21

## 2021-03-30 LAB — CBC
Absolute NRBC: 0 10*3/uL (ref 0.00–0.00)
Hematocrit: 39 % (ref 34.7–43.7)
Hgb: 12.6 g/dL (ref 11.4–14.8)
MCH: 29.1 pg (ref 25.1–33.5)
MCHC: 32.3 g/dL (ref 31.5–35.8)
MCV: 90.1 fL (ref 78.0–96.0)
MPV: 9.6 fL (ref 8.9–12.5)
Nucleated RBC: 0 /100 WBC (ref 0.0–0.0)
Platelets: 337 10*3/uL (ref 142–346)
RBC: 4.33 10*6/uL (ref 3.90–5.10)
RDW: 13 % (ref 11–15)
WBC: 17.21 10*3/uL — ABNORMAL HIGH (ref 3.10–9.50)

## 2021-03-30 LAB — GFR: EGFR: >60

## 2021-03-30 LAB — BASIC METABOLIC PANEL
Anion Gap: 12 (ref 5.0–15.0)
BUN: 20 mg/dL — ABNORMAL HIGH (ref 7.0–19.0)
CO2: 24 mEq/L (ref 22–29)
Calcium: 9.8 mg/dL (ref 8.5–10.5)
Chloride: 98 mEq/L — ABNORMAL LOW (ref 100–111)
Creatinine: 0.7 mg/dL (ref 0.6–1.0)
Glucose: 96 mg/dL (ref 70–100)
Potassium: 4.6 mEq/L (ref 3.5–5.1)
Sodium: 134 mEq/L — ABNORMAL LOW (ref 136–145)

## 2021-03-30 NOTE — Plan of Care (Signed)
Pt is AOx4. Pt reports pain in her lower back. Pt given prn pain medications as ordered. Pt denies chest pain. Pt is on tele. Will continue to monitor.     Problem: Moderate/High Fall Risk Score >5  Goal: Patient will remain free of falls  Outcome: Progressing  Flowsheets  Taken 03/29/2021 2048 by Lorrene Reid, RN  High (Greater than 13):   MOD-Use of chair-pad alarm when appropriate   MOD-Remain with patient during toileting   MOD-Use of assistive devices -Bedside Commode if appropriate   MOD-Perform dangle, stand, walk (DSW) prior to mobilization   MOD-Request PT/OT consult order for patients with gait/mobility impairment   MOD-Place Fall Risk level on whiteboard in room   HIGH-Visual cue at entrance to patient's room   HIGH-Bed alarm on at all times while patient in bed   HIGH-Utilize chair pad alarm for patient while in the chair   HIGH-Apply yellow "Fall Risk" arm band   HIGH-Pharmacy to initiate evaluation and intervention per protocol   HIGH-Initiate use of floor mats as appropriate   HIGH-Consider use of low bed  Taken 03/28/2021 2000 by Pincus Sanes, RN  Moderate Risk (6-13):   MOD-Remain with patient during toileting   MOD-Floor mat at bedside (where available) if appropriate   MOD-Consider activation of bed alarm if appropriate     Problem: Safety  Goal: Patient will be free from injury during hospitalization  Outcome: Progressing  Flowsheets (Taken 03/29/2021 0503 by Pincus Sanes, RN)  Patient will be free from injury during hospitalization:   Assess patient's risk for falls and implement fall prevention plan of care per policy   Provide and maintain safe environment   Use appropriate transfer methods   Ensure appropriate safety devices are available at the bedside   Include patient/ family/ care giver in decisions related to safety   Hourly rounding  Goal: Patient will be free from infection during hospitalization  Outcome: Progressing  Flowsheets (Taken 03/29/2021 0503 by Pincus Sanes,  RN)  Free from Infection during hospitalization:   Assess and monitor for signs and symptoms of infection   Monitor lab/diagnostic results     Problem: Side Effects from Pain Analgesia  Goal: Patient will experience minimal side effects of analgesic therapy  Outcome: Progressing  Flowsheets (Taken 03/26/2021 0346 by Junie Bame, RN)  Patient will experience minimal side effects of analgesic therapy:   Monitor/assess patient's respiratory status (RR depth, effort, breath sounds)   Assess for changes in cognitive function   Prevent/manage side effects per LIP orders (i.e. nausea, vomiting, pruritus, constipation, urinary retention, etc.)   Evaluate for opioid-induced sedation with appropriate assessment tool (i.e. POSS)     Problem: Discharge Barriers  Goal: Patient will be discharged home or other facility with appropriate resources  Outcome: Progressing  Flowsheets (Taken 03/25/2021 1825 by Gwendlyn Deutscher, Angelica, RN)  Discharge to home or other facility with appropriate resources: Provide appropriate patient education     Problem: Psychosocial and Spiritual Needs  Goal: Demonstrates ability to cope with hospitalization/illness  Outcome: Progressing  Flowsheets (Taken 03/25/2021 1825 by Gwendlyn Deutscher, Angelica, RN)  Demonstrates ability to cope with hospitalizations/illness:   Encourage verbalization of feelings/concerns/expectations   Provide quiet environment   Encourage participation in diversional activity     Problem: Compromised Hemodynamic Status  Goal: Vital signs and fluid balance maintained/improved  Outcome: Progressing  Flowsheets (Taken 03/26/2021 2050 by [place], Caitlin, RN)  Vital signs and fluid balance are maintained/improved:   Position patient for maximum circulation/cardiac  output   Monitor/assess vitals and hemodynamic parameters with position changes   Monitor intake and output. Notify LIP if urine output is less than 30 mL/hour.   Monitor/assess lab values and report abnormal values   Monitor and compare  daily weight     Problem: Inadequate Gas Exchange  Goal: Adequate oxygenation and improved ventilation  Outcome: Progressing  Flowsheets (Taken 03/29/2021 0503 by Pincus Sanes, RN)  Adequate oxygenation and improved ventilation:   Assess lung sounds   Monitor SpO2 and treat as needed   Teach/reinforce use of incentive spirometer 10 times per hour while awake, cough and deep breath as needed   Plan activities to conserve energy: plan rest periods   Increase activity as tolerated/progressive mobility   Consult/collaborate with Respiratory Therapy     Problem: Compromised Tissue integrity  Goal: Damaged tissue is healing and protected  Outcome: Progressing  Flowsheets (Taken 03/29/2021 0503 by Pincus Sanes, RN)  Damaged tissue is healing and protected:   Monitor/assess Braden scale every shift   Provide wound care per wound care algorithm   Reposition patient every 2 hours and as needed unless able to reposition self   Increase activity as tolerated/progressive mobility   Relieve pressure to bony prominences for patients at moderate and high risk  Goal: Nutritional status is improving  Outcome: Progressing  Flowsheets (Taken 03/26/2021 2050 by [place], Caitlin, RN)  Nutritional status is improving:   Allow adequate time for meals   Encourage patient to take dietary supplement(s) as ordered   Collaborate with Clinical Nutritionist   Include patient/patient care companion in decisions related to nutrition

## 2021-03-30 NOTE — Progress Notes (Signed)
PULSE OXIMETRY TESTING:       Document patient's oxygen at rest on room air:     _____% on room air, at rest (No ranges please)     _____% on oxygen, at rest at_____LPM via NC      IF 88% OR BELOW on room air, STOP HERE,     IF NOT ambulate patient on room air with exertion and document below:     _____% on room air, with exertion (must be 88% or below)     _____% on oxygen, with exertion, at_____LPM via NC          All three tests must be during the same session.       Please copy and paste this template,  document saturations in appropriate places, (NO RANGES PLEASE)  in a PROGRESS NOTE.     Testing to qualify for home oxygen must be no earlier than 48 hours prior to discharge, or it will need to be repeated.

## 2021-03-30 NOTE — Progress Notes (Signed)
PULMONARY PROGRESS NOTE                                                                                                              [phone]    Date Time: 03/30/21 2:33 PM  Patient Name: [name], [name] 50 y.o. female admitted with SOB (shortness of breath)  Admit Date: 03/22/2021    Patient status: Inpatient  Hospital Day: 8           Assessment:     . Acute exacerbation of chronic obstructive pulmonary disease  . Hypoxic respiratory insufficiency  . Morbid obesity  . Obstructive sleep apnea hypertension  . Tobacco abuse    Plan:       . Arrange for home oxygen with ambulatory pulse oximetry if less than 90 arrange for home oxygen  . CPAP   . Avoid narcotics  . Controlled oxygen      Subjective:           Patient is sitting in the chair on supplemental oxygen home oxygen and CPAP arrangement is being made        Medications:     Current Facility-Administered Medications   Medication Dose Route Frequency   . albuterol-ipratropium  3 mL Nebulization BID   . cefTRIAXone  1 g Intravenous Q24H   . cetirizine  10 mg Oral Daily   . famotidine  20 mg Oral BID   . fluticasone furoate-vilanterol  1 puff Inhalation QAM   . heparin (porcine)  5,000 Units Subcutaneous Q8H [place]   . indomethacin  50 mg Oral BID AC   . lidocaine  1 patch Transdermal Q24H   . montelukast  10 mg Oral QHS   . nicotine  1 patch Transdermal Daily   . oxybutynin  10 mg Oral TID   . predniSONE  50 mg Oral QAM W/BREAKFAST   . risperiDONE  3 mg Oral BID   . sertraline  100 mg Oral BID       Review of Systems:        General ROS:  Afebrile    ENT ROS:  No sore throat no nasal discharge   Endocrine ROS:   fatigue   Respiratory ROS: Shortness of breath    cardiovascular ROS:  No chest pain or palpitation   Gastrointestinal ROS:  No nausea vomiting diarrhea.  No melanotic stool   Genito-Urinary ROS:  No burning in the urine or hematuria   Musculoskeletal ROS:  No musculoskeletal deformities   Neurological ROS:  No stroke seizure  disorder   Dermatological ROS:  No skin rash        Physical Exam:     Vitals:    03/30/21 1202   BP: 105/66   Pulse: 81   Resp: 22   Temp: 98.8 F (37.1 C)   SpO2: 97%         Intake/Output Summary (Last 24 hours) at 03/30/2021 1433  Last data filed at 03/30/2021 1202  Gross per 24 hour   Intake 1260 ml  Output 2250 ml   Net -990 ml           General appearance - no visible respiratory distress patient does not appear toxic  Mental status -  Alert and oriented x3  Eyes - EOMI PERRLA  Nose - no nasal discharge  Mouth - mucous membrane is moist.    Neck - no JVD lymphadenopathy or thyromegaly  Chest -decreased breath sound all over the lung field no wheezing heard  Heart - S1-S2 RRR no S3-S4 no murmur  Abdomen - soft nontender bowel sounds are normal with no hepatosplenomegaly  Neurological - no motor or sensory deficit  Extremities - no edema clubbing or cyanosis  Skin - no skin rash      Labs:     CBC w/Diff CMP   Recent Labs   Lab 03/30/21  0251 03/29/21  0401 03/28/21  0359   WBC 17.21* 15.78* 15.44*   Hgb 12.6 13.1 12.4   Hematocrit 39.0 39.9 38.2   Platelets 337 354* 368*   MCV 90.1 89.7 90.3       PT/INR         Recent Labs   Lab 03/30/21  0251 03/29/21  0401 03/28/21  0359   Sodium 134* 133* 134*   Potassium 4.6 4.7 4.9   Chloride 98* 99* 100   CO2 _0 BUN 20.0* 20.0* 17.0   Creatinine 0.7 0.6 0.6   Glucose 96 120* 119*   Calcium 9.8 10.4 9.9      Glucose POCT   Recent Labs   Lab 03/30/21  0251 03/29/21  0401 03/28/21  0359 03/27/21  0641 03/26/21  0404 03/25/21  0430 03/24/21  0805   Glucose 96 120* 119* 150* 150* 163* 139*        Recent Labs   Lab 03/23/21  1704   Troponin I <0.01         ABGs:    ABG CollectionSite   Date Value Ref Range Status   03/29/2021 Right Radl  Final     Allen's Test   Date Value Ref Range Status   03/29/2021 Yes  Final     pH, Arterial   Date Value Ref Range Status   03/29/2021 7.405 7.350 - 7.450 Final     pCO2, Arterial   Date Value Ref Range Status   03/29/2021 40.4  35.0 - 45.0 mmHg Final     pO2, Arterial   Date Value Ref Range Status   03/29/2021 114.0 (H) 80.0 - 90.0 mmHg Final     HCO3, Arterial   Date Value Ref Range Status   03/29/2021 24.8 23.0 - 29.0 mEq/L Final     Base Excess, Arterial   Date Value Ref Range Status   03/29/2021 0.6 -2.0 - 2.0 mEq/L Final     O2 Sat, Arterial   Date Value Ref Range Status   03/29/2021 98.2 95.0 - 100.0 % Final       Urinalysis  Recent Labs   Lab 03/23/21  1933   Urine Type Urine, Clean Ca   Color, UA Yellow   Clarity, UA Hazy   Specific Gravity UA 1.009   Urine pH 7.0   Nitrite, UA Negative   Ketones UA Negative   Urobilinogen, UA Negative   Bilirubin, UA Negative   Blood, UA Small*   RBC, UA 0 - 2   WBC, UA 0 - 5         Rads:  No results found.      Quillian Quince MD  03/30/2021  2:33 PM

## 2021-03-30 NOTE — Plan of Care (Signed)
Shift Summary:   Pt remained free from falls and injury throughout shift. Safety and fall risk precautions in place  Purposeful rounding implemented. OOB with stand by assistance and walker. Able to ambulate a few steps but stayed in chair for majority of the day.     Alert & Oriented: x4   Pain: moderated to severe c/o og back pain, pern pain meds given  Vitals: VSS 96 % on 2L NC  Telemetry: NSR  Assessment: Pt denies HA, dizziness, blurred vision, CP, N/V, diarrhea/constipation. Dyspnea on exertion   VTE Prophylaxis: Heparin  Diet: Regular  D/C Plan: Awaiting at home CPAP machine shipment.    Care Plan:      Problem: Moderate/High Fall Risk Score >5  Goal: Patient will remain free of falls  Outcome: Progressing  Flowsheets (Taken 03/30/2021 1000)  High (Greater than 13):  Marland Kitchen HIGH-Consider use of low bed  . HIGH-Initiate use of floor mats as appropriate  . HIGH-Apply yellow "Fall Risk" arm band  . HIGH-Utilize chair pad alarm for patient while in the chair  . HIGH-Bed alarm on at all times while patient in bed  . HIGH-Visual cue at entrance to patient's room  . HIGH-Pharmacy to initiate evaluation and intervention per protocol     Problem: Safety  Goal: Patient will be free from injury during hospitalization  Outcome: Progressing  Flowsheets (Taken 03/30/2021 1626)  Patient will be free from injury during hospitalization:  . Assess patient's risk for falls and implement fall prevention plan of care per policy  . Provide and maintain safe environment  . Use appropriate transfer methods  . Ensure appropriate safety devices are available at the bedside  . Include patient/ family/ care giver in decisions related to safety  . Hourly rounding  Goal: Patient will be free from infection during hospitalization  Outcome: Progressing  Flowsheets (Taken 03/30/2021 1626)  Free from Infection during hospitalization:  . Assess and monitor for signs and symptoms of infection  . Monitor lab/diagnostic results  . Monitor all insertion  sites (i.e. indwelling lines, tubes, urinary catheters, and drains)     Problem: Pain  Goal: Pain at adequate level as identified by patient  Outcome: Progressing  Flowsheets (Taken 03/30/2021 1626)  Pain at adequate level as identified by patient:  . Identify patient comfort function goal  . Assess pain on admission, during daily assessment and/or before any "as needed" intervention(s)  . Reassess pain within 30-60 minutes of any procedure/intervention, per Pain Assessment, Intervention, Reassessment (AIR) Cycle  . Evaluate if patient comfort function goal is met  . Offer non-pharmacological pain management interventions  . Include patient/patient care companion in decisions related to pain management as needed     Problem: Side Effects from Pain Analgesia  Goal: Patient will experience minimal side effects of analgesic therapy  Outcome: Progressing  Flowsheets (Taken 03/30/2021 1626)  Patient will experience minimal side effects of analgesic therapy:  . Monitor/assess patient's respiratory status (RR depth, effort, breath sounds)  . Prevent/manage side effects per LIP orders (i.e. nausea, vomiting, pruritus, constipation, urinary retention, etc.)  . Assess for changes in cognitive function  . Evaluate for opioid-induced sedation with appropriate assessment tool (i.e. POSS)     Problem: Discharge Barriers  Goal: Patient will be discharged home or other facility with appropriate resources  Outcome: Progressing  Flowsheets (Taken 03/30/2021 1626)  Discharge to home or other facility with appropriate resources:  . Provide appropriate patient education  . Provide information on available health resources  Problem: Psychosocial and Spiritual Needs  Goal: Demonstrates ability to cope with hospitalization/illness  Outcome: Progressing  Flowsheets (Taken 03/30/2021 1626)  Demonstrates ability to cope with hospitalizations/illness:  . Encourage verbalization of feelings/concerns/expectations  . Provide quiet environment  .  Assist patient to identify own strengths and abilities  . Include patient/ patient care companion in decisions     Problem: Compromised Hemodynamic Status  Goal: Vital signs and fluid balance maintained/improved  Outcome: Progressing  Flowsheets (Taken 03/30/2021 1626)  Vital signs and fluid balance are maintained/improved:  Marland Kitchen Position patient for maximum circulation/cardiac output  . Monitor/assess vitals and hemodynamic parameters with position changes  . Monitor and compare daily weight  . Monitor/assess lab values and report abnormal values     Problem: Inadequate Gas Exchange  Goal: Adequate oxygenation and improved ventilation  Outcome: Progressing  Flowsheets (Taken 03/30/2021 1626)  Adequate oxygenation and improved ventilation:  . Assess lung sounds  . Monitor SpO2 and treat as needed  . Provide mechanical and oxygen support to facilitate gas exchange  . Teach/reinforce use of incentive spirometer 10 times per hour while awake, cough and deep breath as needed  . Plan activities to conserve energy: plan rest periods  . Consult/collaborate with Respiratory Therapy  . Position for maximum ventilatory efficiency  . Increase activity as tolerated/progressive mobility     Problem: Compromised Tissue integrity  Goal: Damaged tissue is healing and protected  Outcome: Progressing  Flowsheets (Taken 03/29/2021 0503 by Pincus Sanes, RN)  Damaged tissue is healing and protected:  Marland Kitchen Monitor/assess Braden scale every shift  . Provide wound care per wound care algorithm  . Reposition patient every 2 hours and as needed unless able to reposition self  . Increase activity as tolerated/progressive mobility  . Relieve pressure to bony prominences for patients at moderate and high risk  Goal: Nutritional status is improving  Outcome: Progressing  Flowsheets (Taken 03/26/2021 2050 by Martinique, Caitlin, RN)  Nutritional status is improving:  . Allow adequate time for meals  . Encourage patient to take dietary supplement(s) as  ordered  . Collaborate with Clinical Nutritionist  . Include patient/patient care companion in decisions related to nutrition

## 2021-03-30 NOTE — Progress Notes (Signed)
PULSE OXIMETRY TESTING:       Document patient's oxygen at rest on room air:     __95__% on room air, at rest      _97__% on oxygen, at rest at_2__LPM via NC      IF 88% OR BELOW on room air, STOP HERE,     IF NOT ambulate patient on room air with exertion and document below:     _91_% on room air, with exertion (must be 88% or below)     _91__% on oxygen, with exertion, at__2_LPM via NC

## 2021-03-30 NOTE — Progress Notes (Signed)
[facility]  PROGRESS NOTE      Patient: [name]  Date: 03/30/2021   LOS: 8 Days  Admission Date: 03/22/2021   MRN: [id number]  Attending: Dr. Romana Juniper Blair Hailey, PA-C  Please contact me on secure chat       ASSESSMENT/PLAN     CHESLEY VALLS is a 50 y.o. female with a PMHx of tobacco dependence, chronic respiratory failure secondary to COPD (O2 dependent on 2 L via NC), history of psychosis, anxiety/depression, OSA (not using CPAP), morbid obesity, and chronic back pain admitted with acute COPD exacerbation, interstitial pneumonitis.      Interval Summary:   Patient  Has a history of chronic respiratory failure on 2L home O2 due to COPD and morbid obesity, untreated sleep apnea, and presented with acute on chronic respiratory failure and distress. Lung findings significant for rhonchi, wheezing, and crackles with increased work of breathing for much of her hospital stay, gradually improving. 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 Patient has sleep apnea worsened by her home pain meds. Pain meds have been decreased. She needs sleep study. Pulmonary was consulted, recommending antibiotics and steroids, outpatient pulm followup. Today steroids were switched to oral, and I have asked CM to look for SNF as still significant DOE as well as daytime somnolence due to needing cpap and weaning of pain meds. Imaging on 5/8 shows continued infiltrates, mildly improved.    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  Enlarged main pulmonary trunk suggestive of underlying pulmonary arterial hypertension.  CTA chest without definite evidence of PE, again notes bilateral ground glass infiltrates.  CXR 5/8: persistent mildly improved air space disease  -Status post IV Solumedrol , continue with oral prednisone  -Titrate oxygen to maintain O2 saturations 88 to 92%  -Ceftazidime changed to azithromycin + ceftriaxone  -Continue DuoNebs  -Antitussives as needed  -BNP and serial troponins wnl, Echocardiogram EF 60-65%, mild TR, moderate pulm htn  -ESR and CRP elevated, ANA and Rh negative. HIV non-reactive.  -Sputum culture contaminated, RVP negative  -Appreciate pulmonology evaluation, likely nonspecific interstitial pneumonitis.   -smoking cessation       OSA, untreated  -Patient with history of obstructive sleep apnea diagnosed by sleep study 3 years ago.  Patient was compliant with CPAP at home until it broke  -Patient is discharged however awaiting to receive CPAP prior to discharge  -As per pulmonology "If patient does not receive CPAP in next 24 to 48-hour due to her morbid obesity and sleep apnea her condition may deteriorate"  -Case management on board.  Patient will have her home CPAP set up prior to discharge    Hyperkalemia  -Improved with kayexalate  -Daily BMP    Altered mental status, labile  Hypercarbia   -Patient with increased somnolence in the morning that improves by the afternoon. Suspect secondary to CO2 retention overnight, worsened by oxycodone/gabapentin.    -Continue with CPAP at nighttime    Chronic back pain  -Continue home indomethacin. Continue gabapentin and oxycodone at lower dose     Morbid obesity BMI 54  -Lifestyle modification with diet and exercise on discharge    Anxiety/depression  -  Continue home Xanax, risperidone, and sertraline. Atomoxetine non-formulary, resume on discharge    Tobacco dependence  -Cessation advised, continue nicotine patch. Will need prescription on discharge    GERD  -Pepcid     Dysuria  -Trace LE, continue abx as above      Analgesia: Tylenol, oxycodone,  gabapentin    Nutrition: regular    DVT Prophylaxis:Heparin       Code Status: Full    DISPO: Likely home with home health tomorrow once CPAP can be arranged        SUBJECTIVE     [name] is much awake this morning.  Patient reports that she feels much better.  Still requiring supplemental oxygen.  Currently denies any fever chills nausea vomiting diarrhea chest pain.  Does report shortness of breath with exertion     MEDICATIONS     Current Facility-Administered Medications   Medication Dose Route Frequency   . albuterol-ipratropium  3 mL Nebulization BID   . cefTRIAXone  1 g Intravenous Q24H   . cetirizine  10 mg Oral Daily   . famotidine  20 mg Oral BID   . fluticasone furoate-vilanterol  1 puff Inhalation QAM   . heparin (porcine)  5,000 Units Subcutaneous Q8H Leonidas   . indomethacin  50 mg Oral BID AC   . lidocaine  1 patch Transdermal Q24H   . montelukast  10 mg Oral QHS   . nicotine  1 patch Transdermal Daily   . oxybutynin  10 mg Oral TID   . predniSONE  50 mg Oral QAM W/BREAKFAST   . risperiDONE  3 mg Oral BID   . sertraline  100 mg Oral BID       PHYSICAL EXAM     Vitals:    03/30/21 1606   BP: 104/69   Pulse: 77   Resp: 22   Temp: 98.1 F (36.7 C)   SpO2: 96%       Temperature: Temp  Min: 97.3 F (36.3 C)  Max: 98.8 F (37.1 C)  Pulse: Pulse  Min: 65  Max: 84  Respiratory: Resp  Min: 19  Max: 22  Non-Invasive BP: BP  Min: 94/62  Max: 125/85  Pulse Oximetry SpO2  Min: 91 %  Max: 98 %    Intake and Output Summary (Last 24 hours) at Date Time    Intake/Output Summary (Last 24 hours) at 03/30/2021 1757  Last data filed at 03/30/2021 1700  Gross per 24 hour   Intake 1840 ml   Output 2700 ml   Net -860 ml       GEN APPEARANCE: Awake, NAD.  Morbidly obese  HEENT: PERLA; EOMI; Conjunctiva Clear  NECK: Supple; No bruits  CVS: RRR, S1, S2; No M/G/R  LUNGS: Diminished breath sounds bilaterally, no rales, wheezing or rhonchi  ABD: Obese abdomen, Soft; No TTP; + Normoactive BS  EXT: No edema; Pulses 2+ and  intact  Skin exam:  no pallor  NEURO: CN 2-12 intact; No Focal neurological deficits  CAP REFILL:  Normal  MENTAL STATUS:  Normal      LABS     Recent Labs   Lab 03/30/21  0251 03/29/21  0401 03/28/21  0359   WBC 17.21* 15.78* 15.44*   RBC 4.33 4.45 4.23   Hgb 12.6 13.1 12.4   Hematocrit 39.0 39.9 38.2   MCV 90.1 89.7 90.3   Platelets 337 354* 368*       Recent Labs   Lab 03/30/21  8366 03/29/21  0401 03/28/21  0359 03/27/21  0641 03/26/21  0404   Sodium 134* 133* 134* 136 137   Potassium 4.6 4.7 4.9 4.7 4.2   Chloride 98* 99* 100 102 100   CO2 _0 BUN 20.0* 20.0* 17.0 17.0 13.0   Creatinine 0.7 0.6 0.6 0.6 0.6   Glucose 96 120* 119* 150* 150*   Calcium 9.8 10.4 9.9 10.3 10.2                         Microbiology Results (last 15 days)     Procedure Component Value Units Date/Time    Respiratory Pathogen Panel w/COVID-19, PCR [[id number]] Collected: 03/24/21 1307    Order Status: Completed Specimen: Nasopharyngeal Updated: 03/25/21 0512     Adenovirus Not Detected     Coronavirus 229E Not Detected     Coronavirus HKU1 Not Detected     Coronavirus NL63 Not Detected     Coronavirus OC43 Not Detected     SARS CoV-2 RNA Not Detected     Human Metapneumovirus Not Detected     Human Rhinovirus/Enterovirus Not Detected     Influenza A Not Detected     Influenza A/H1 Not Detected     Influenza AH1 - 2009 Not Detected     Influenza A/H3 Not Detected     Influenza B Not Detected     Parainfluenza Virus 1 Not Detected     Parainfluenza Virus 2 Not Detected     Parainfluenza Virus 3 Not Detected     Parainfluenza Virus 4 Not Detected     Respiratory Syncytial Virus Not Detected     Bordetella pertussis Not Detected     Bordetella parapertussis PCR Not Detected     Chlamydophila pneumoniae Not Detected     Mycoplasma pneumoniae Not Detected     Source NP Swab     Test Comment for Respiratory Panel with COVID See Below     Comment: Testing performed using the [employer] Film[employer]  Respiratory Panel (RP 2.1)  This test is  for the qualitative detection of  respiratory pathogen nucleic acid. This assay cannot  differentiate between Rhinovirus/Enterovirus. If  necessary for patient care, a positive result for  Rhinovirus/Enterovirus may be followed-up using an  alternate method. This assay should not be used if  B. pertussis infection is specifically suspected as it  is less sensitive than other alternatives. If suspected,  ensure that B. pertussis specific testing is ordered.  Recent administration of a nasal influenza vaccine may  cause false positive results for Influenza A and/or  Influenza B.  Viral and bacterial nucleic acids may persist even  though no viable organism is present. Detection of  nucleic acid does not imply that the corresponding  organisms are infectious or are the causative agents of  clinical symptoms. Positive results of this test do not  rule out coinfection with other organisms. A negative  result does not exclude the possibility of viral or  bacterial infection.  Assay performance characteristics  may vary with circulating strains and this assay may not  be able to distinguish between existing viral strains  and new variants as they emerge. Results of this test  should not be used as the sole basis for diagnosis,  treatment, or other patient management decisions.  This assay is FDA authorized for nasopharyngeal swab  samples.  Performance characteristics for  Bronchoalveolar lavage samples have been determined by  the [facility] laboratory. Other sample types are unacceptable.  Invalid results may be due to inhibiting substances in  the specimen and recollection should occur.          Purpose of COVID testing Diagnostic -PUI    Narrative:      For NP, Call laboratory for VTM NP Swab collection device.  Bronchoalveolar Lavage is submitted in a sterile container.  Diagnostic -PUI    CULTURE + Lacretia Leigh [[id number]] Collected: 03/23/21 2208    Order Status: Completed Specimen: Sputum, Expectorated  Updated: 03/24/21 0620    Narrative:      Results Faxed [phone], by [id number] on 03/24/2021 at 06:20  Culture and Gram Stain, Aerobic, Respiratory was cancelled on 03/24/21 at  06:20 by [phone]; Specimen quality is inadequate for culture  Specimen appears  to be saliva  Please submit another sputum specimen  Induction of sputum may  improve specimen quality  ORDER#: [id number]                                    ORDERED BY: JABBAR, LUBNA  SOURCE: Sputum, Expectorated as below                COLLECTED:  03/23/21 22:08  ANTIBIOTICS AT COLL.:                                RECEIVED :  03/24/21 01:47  Results Faxed [phone], [place] [id number] on 03/24/2021 at 06:20  Culture and Gram Stain, Aerobic, Respiratory was cancelled on 03/24/21 at 06:20 by [id number]; Specimen quality is inadequate for  culture  Specimen appears to be saliva  Please submit another sputum specimen  Induction of sputum may improve specimen quality  Stain, Gram (Respiratory)                  FINAL       03/24/21 06:20  03/24/21   Smear contains > or = 10 squamous epithelial cells per low             power field, suggestive of significant oral contamination and             poor quality specimen. Culture not performed.             Please recollect if clinically indicated.  Culture and Gram Stain, Aerobic, RespiratorCANCELLED   03/24/21 06:20            - cancelled on 03/24/21 06:20   by  [id number]   Specimen quality is inadequate for culture  Specimen appears to be saliva   Please submit another sputum specimen  Induction of sputum may improve specimen   quality      Culture Blood Aerobic and Anaerobic [[id number]] Collected: 03/22/21 2349    Order Status: Completed Specimen: Arm from Blood, Venipuncture Updated: 03/28/21 0821    Narrative:      ORDER#: [id number]                                    ORDERED BY: HE, ALBERT  SOURCE: Blood, Venipuncture Arm                      COLLECTED:  03/22/21 23:49  ANTIBIOTICS AT COLL.:  RECEIVED :  03/23/21 05:29  Culture Blood Aerobic and Anaerobic        FINAL       03/28/21 08:21  03/28/21   No growth after 5 days of incubation.      Culture Blood Aerobic and Anaerobic [[id number]] Collected: 03/22/21 2349    Order Status: Completed Specimen: Arm from Blood, Venipuncture Updated: 03/28/21 0821    Narrative:      ORDER#: [id number]                                    ORDERED BY: HE, ALBERT  SOURCE: Blood, Venipuncture Arm                      COLLECTED:  03/22/21 23:49  ANTIBIOTICS AT COLL.:                                RECEIVED :  03/23/21 05:29  Culture Blood Aerobic and Anaerobic        FINAL       03/28/21 08:21  03/28/21   No growth after 5 days of incubation.      COVID-19 (SARS-CoV-2) and Influenza A/B, NAA (Liat Rapid)- Admission [[id number]] Collected: 03/22/21 2227    Order Status: Completed Specimen: Culturette from Nasopharyngeal Updated: 03/22/21 2304     Purpose of COVID testing Diagnostic -PUI     SARS-CoV-2 Specimen Source Nasal Swab     SARS CoV 2 Overall Result Not Detected     Comment: __________________________________________________  -A result of "Detected" indicates POSITIVE for the    presence of SARS CoV-2 RNA  -A result of "Not Detected" indicates NEGATIVE for the    presence of SARS CoV-2 RNA  __________________________________________________________  Test performed using the Roche cobas Liat SARS-CoV-2 assay. This assay is  only for use under the Food and Drug Administration's Emergency Use  Authorization. This is a real-time RT-PCR assay for the qualitative  detection of SARS-CoV-2 RNA. Viral nucleic acids may persist in vivo,  independent of viability. Detection of viral nucleic acid does not imply the  presence of infectious virus, or that virus nucleic acid is the cause of  clinical symptoms. Negative results do not preclude SARS-CoV-2 infection and  should not be used as the sole basis for diagnosis, treatment or other  patient management decisions. Negative  results must be combined with  clinical observations, patient history, and/or epidemiological information.  Invalid results may be due to inhibiting substances in the specimen and  recollection should occur. Please see Fact Sheets for patients and providers  located:  https://www.benson-chung.com/          Influenza A Not Detected     Influenza B Not Detected     Comment: Test performed using the Roche cobas Liat SARS-CoV-2 & Influenza A/B assay.  This assay is only for use under the Food and Drug Administration's  Emergency Use Authorization. This is a multiplex real-time RT-PCR assay  intended for the simultaneous in vitro qualitative detection and  differentiation of SARS-CoV-2, influenza A, and influenza B virus RNA. Viral  nucleic acids may persist in vivo, independent of viability. Detection of  viral nucleic acid does not imply the presence of infectious virus, or that  virus nucleic acid is the cause of clinical symptoms. Negative results do  not preclude SARS-CoV-2, influenza A,  and/or influenza B infection and  should not be used as the sole basis for diagnosis, treatment or other  patient management decisions. Negative results must be combined with  clinical observations, patient history, and/or epidemiological information.  Invalid results may be due to inhibiting substances in the specimen and  recollection should occur. Please see Fact Sheets for patients and providers  located: http://olson-hall.info/.         Narrative:      o Collect and clearly label specimen type:  o PREFERRED-Upper respiratory specimen: One Nasal Swab in  Transport Media.  o Hand deliver to laboratory ASAP  Diagnostic -PUI           RADIOLOGY     CT Chest without Contrast    Result Date: 03/22/2021  Patchy groundglass like opacities following the bronchovascular bundles throughout the lung fields bilaterally, have increased from the prior study. Differential diagnosis includes but is not limited to  NSIP, hypersensitivity and drug induced pneumonitis, and atypical pneumonia. Stable mild cylindrical bronchiectasis, with parenchymal scarring and honeycombing within the lung apices. Enlarged main pulmonary trunk is suggestive of underlying pulmonary arterial hypertension. Status post cholecystectomy. Timmie Foerster MD, MD  03/22/2021 11:02 PM    CT Angiogram Chest    Result Date: 03/23/2021   There is suboptimal opacification of the pulmonary arteries particularly in the upper lobes. No definite pulmonary embolism is noted. Bilateral groundglass infiltrates Steward Drone, MD  03/23/2021 10:48 PM    XR Chest AP Portable    Result Date: 03/27/2021   Persistent but mildly improved interstitial and airspace disease. Stable cardiomegaly. [name], MD  03/27/2021 8:38 AM    XR Chest AP Portable    Result Date: 03/24/2021   Progressive diffuse lung disease Elyn Peers, MD  03/24/2021 10:20 AM    Chest AP Portable    Result Date: 03/22/2021   Pulmonary edema. [name], MD  03/22/2021 7:44 PM      Signed,  Marya Landry, MD  5:57 PM 03/30/2021

## 2021-03-30 NOTE — Progress Notes (Addendum)
Completed and signed (Dr. Mallie Darting) PAP Dispensing Order faxed to [facility], [phone].  CM to follow.    1:45PM: [place], [place] called CM.  Above document received.  CM confirmed that patient needs CPAP prior to discharging from the hospital.  [place] will determine logistics for equipment delivery to the hospital and call CM back.    3:03PM: Return call from [place], [place].  A Respiratory Therapist is not available today to come to the hospital to setup patient's CPAP, but will come tomorrow and arrive between 9:00AM-12:00PM.      Memory Argue, MSW, ACM-SW  Social Worker Case Manager [facility]  [phone]

## 2021-03-31 DIAGNOSIS — K219 Gastro-esophageal reflux disease without esophagitis: Secondary | ICD-10-CM | POA: Diagnosis present

## 2021-03-31 DIAGNOSIS — G8929 Other chronic pain: Secondary | ICD-10-CM | POA: Diagnosis present

## 2021-03-31 DIAGNOSIS — F17200 Nicotine dependence, unspecified, uncomplicated: Secondary | ICD-10-CM | POA: Diagnosis present

## 2021-03-31 DIAGNOSIS — E875 Hyperkalemia: Secondary | ICD-10-CM | POA: Diagnosis present

## 2021-03-31 DIAGNOSIS — J8489 Other specified interstitial pulmonary diseases: Secondary | ICD-10-CM | POA: Diagnosis present

## 2021-03-31 DIAGNOSIS — R4182 Altered mental status, unspecified: Secondary | ICD-10-CM | POA: Diagnosis present

## 2021-03-31 DIAGNOSIS — J9610 Chronic respiratory failure, unspecified whether with hypoxia or hypercapnia: Secondary | ICD-10-CM | POA: Diagnosis present

## 2021-03-31 DIAGNOSIS — R0689 Other abnormalities of breathing: Secondary | ICD-10-CM | POA: Diagnosis present

## 2021-03-31 DIAGNOSIS — M549 Dorsalgia, unspecified: Secondary | ICD-10-CM | POA: Diagnosis present

## 2021-03-31 DIAGNOSIS — I272 Pulmonary hypertension, unspecified: Secondary | ICD-10-CM | POA: Diagnosis present

## 2021-03-31 LAB — BASIC METABOLIC PANEL
Anion Gap: 10 (ref 5.0–15.0)
BUN: 19 mg/dL (ref 7.0–19.0)
CO2: 27 mEq/L (ref 22–29)
Calcium: 9.6 mg/dL (ref 8.5–10.5)
Chloride: 98 mEq/L — ABNORMAL LOW (ref 100–111)
Creatinine: 0.6 mg/dL (ref 0.6–1.0)
Glucose: 97 mg/dL (ref 70–100)
Potassium: 4.6 mEq/L (ref 3.5–5.1)
Sodium: 135 mEq/L — ABNORMAL LOW (ref 136–145)

## 2021-03-31 LAB — CBC
Absolute NRBC: 0 10*3/uL (ref 0.00–0.00)
Hematocrit: 37.2 % (ref 34.7–43.7)
Hgb: 12 g/dL (ref 11.4–14.8)
MCH: 29.3 pg (ref 25.1–33.5)
MCHC: 32.3 g/dL (ref 31.5–35.8)
MCV: 90.7 fL (ref 78.0–96.0)
MPV: 9.5 fL (ref 8.9–12.5)
Nucleated RBC: 0 /100 WBC (ref 0.0–0.0)
Platelets: 319 10*3/uL (ref 142–346)
RBC: 4.1 10*6/uL (ref 3.90–5.10)
RDW: 14 % (ref 11–15)
WBC: 17.88 10*3/uL — ABNORMAL HIGH (ref 3.10–9.50)

## 2021-03-31 LAB — GFR: EGFR: >60

## 2021-03-31 MED ORDER — PREDNISONE 20 MG PO TABS
ORAL_TABLET | ORAL | 0 refills | Status: AC
Start: 2021-03-31 — End: 2021-04-06

## 2021-03-31 MED ORDER — PREDNISONE 20 MG PO TABS
ORAL_TABLET | ORAL | 0 refills | Status: DC
Start: 2021-03-31 — End: 2021-03-31

## 2021-03-31 MED ORDER — FLUTICASONE FUROATE-VILANTEROL 200-25 MCG/INH IN AEPB
1.0000 | INHALATION_SPRAY | Freq: Every day | RESPIRATORY_TRACT | 0 refills | Status: AC
Start: 2021-03-31 — End: ?

## 2021-03-31 MED ORDER — LIDOCAINE 5 % EX PTCH
1.0000 | MEDICATED_PATCH | CUTANEOUS | 0 refills | Status: AC
Start: 2021-04-01 — End: ?

## 2021-03-31 MED ORDER — FAMOTIDINE 20 MG PO TABS
20.0000 mg | ORAL_TABLET | Freq: Two times a day (BID) | ORAL | 0 refills | Status: AC
Start: 2021-03-31 — End: 2021-04-30

## 2021-03-31 MED ORDER — NICOTINE 14 MG/24HR TD PT24
1.0000 | MEDICATED_PATCH | Freq: Every day | TRANSDERMAL | 0 refills | Status: AC
Start: 2021-04-01 — End: ?

## 2021-03-31 MED ORDER — OXYCODONE HCL 5 MG PO TABS
5.0000 mg | ORAL_TABLET | Freq: Four times a day (QID) | ORAL | 0 refills | Status: AC | PRN
Start: 2021-03-31 — End: 2021-04-07

## 2021-03-31 MED ORDER — INDOMETHACIN 50 MG PO CAPS
50.0000 mg | ORAL_CAPSULE | Freq: Two times a day (BID) | ORAL | 0 refills | Status: AC
Start: 2021-03-31 — End: 2021-04-30

## 2021-03-31 MED ORDER — OXYBUTYNIN CHLORIDE 5 MG PO TABS
10.0000 mg | ORAL_TABLET | Freq: Three times a day (TID) | ORAL | 0 refills | Status: AC
Start: 2021-03-31 — End: 2021-04-30

## 2021-03-31 MED ORDER — MONTELUKAST SODIUM 10 MG PO TABS
10.0000 mg | ORAL_TABLET | Freq: Every evening | ORAL | 0 refills | Status: AC
Start: 2021-03-31 — End: 2021-04-30

## 2021-03-31 NOTE — Discharge Instr - AVS First Page (Addendum)
[facility] DISCHARGE INSTRUCTIONS     Date of Admission: 03/22/2021    Date of Discharge: 03/31/2021    Discharge Physician: Marya Landry, MD    Dear [name],     Thank you for choosing [facility] for your emergency care needs. We strive to provide EXCELLENT care to you and your family.     In an effort to explain clearly why you were here in the hospital, I've written a very brief summary. I hope that you find it useful. Other details including formal diagnosis, medication changes, follow up appointment recommendations, and access to MyChart for formal medical records can be found in this packet.       You were admitted for COPD with acute exacerbation. Make sure to follow up with our [facility] or Virgel Bouquet, MD your primary care doctor for follow-up. I cannot stress the importance of follow up enough.    Make sure to bring the following to your doctors appointments:  Medications in their original bottles  Glucometer/blood sugar log (if diabetic)   Weight log (if you have heart failure)    If you are unable to obtain an appointment, unable to obtain newly prescribed medications, or are unclear about any of your discharge instructions please contact me at [phone] (M-F, 8am-3pm) or weekends and after hours via the hospital operator [phone]) [phone], the hospital case manager, or your primary care physician.    Finally, as your discharging physician, you may be receiving a survey which is regarding my care. I would greatly value and appreciate your feedback as I strive for excellence.     Respectfully yours,    Marya Landry, MD       Home Health Discharge Information    Your doctor has ordered Skilled Nursing Physical Therapy ,Ocopitional Therapy in-home service(s) for you while you recuperate at home, to assist you in the transition from hospital to home.      The agency that you or your representative chose to provide the service:  Name of [place]  Placement:  ([facility] [phone])]      The above services were set up by:  Cecille Amsterdam, RN  [facility] Liaison)   Phone  [phone]    Additional comments:     IF YOU HAVE NOT HEARD [place] 24-48 HOURS AFTER DISCHARGE PLEASE CALL YOUR AGENCY TO ARRANGE A TIME FOR YOUR FIRST VISIT. FOR ANY SCHEDULING CONCERNS OR QUESTIONS RELATED TO HOME HEALTH, SUCH AS TIME OR DATE PLEASE CONTACT YOUR HOME HEALTH AGENCY AT THE NUMBER LISTED ABOVE.

## 2021-03-31 NOTE — PT Progress Note (Signed)
Physical Therapy Note    [facility]  Physical Therapy Treatment    Patient:  [name]  MRN#:  [id number]  Unit:  [address]  Room/Bed:  A2112/A2112-A    Time of treatment:  Time Calculation  PT Received On: 03/31/21  Start Time: 3825  Stop Time: 1210  Time Calculation (min): 39 min            Chart Review and Collaboration with Care Team: 6 minutes, not included in above time.    PT Visit Number: 2    ___________________________________________________    POST ACUTE CARE THERAPY RECOMMENDATIONS:   Discharge Recommendation: Home with supervision, Home with home health PT, Home with home health OT (Pt states her fiance can assist)  If recommended discharge destination is unavailable, patient will require the DME noted below and the following assistance: SNF    DME Recommended for Discharge: No additional equipment/DME recommended at this time      ACUTE CARE THERAPY RECOMMENDATIONS:  Pt would benefit from Physical Therapy to address deficits and increase functional independence.     Is an Occupational Therapy Evaluation Indicated at this time? No, an acute care OT evaluation is not indicated yet; PT will continue to assess.      (Therapy recommendations are subject to change with patient status.  Please refer to the most recent PT/OT note for up-to-date recommendations.)  ___________________________________________________      Precautions:   Precautions  Other Precautions: High falls, BMI    Personal Protective Equipment (PPE)  gloves and procedure mask with face shield    Updated X-Rays/Tests/Labs:  Lab Results   Component Value Date/Time    HGB 12.0 03/31/2021 04:04 AM    HCT 37.2 03/31/2021 04:04 AM    K 4.6 03/31/2021 04:04 AM    NA 135 (L) 03/31/2021 04:04 AM    INR 0.9 03/23/2021 03:06 AM    TROPI <0.01 03/23/2021 05:04 PM    TROPI <0.01 03/23/2021 03:06 AM    TROPI <0.01 03/22/2021 07:49 PM    TROPI <0.01 10/10/2020 10:16 AM       All imaging reviewed, please see chart for  details.      Subjective: Patient would like to go home. "Have they delivered my C pap yet?"        Pain Assessment  Pain Assessment: Numeric Scale (0-10)  Pain Score: 9-severe pain (8-9)  POSS Score: Awake and Alert  Pain Location: Back  Pain Orientation: Lower;Upper  Pain Frequency: Increases with movement  Pain Intervention(s): Repositioned           Patient's medical condition is appropriate for Physical Therapy intervention at this time.  Patient is agreeable to participation in the therapy session.      Objective:    PTA has reviewed chart and PT Eval for POC prior to treatment.      Observation of Patient/Vital Signs: Blood pressure 98/63, pulse 69, temperature 98.2 F (36.8 C), temperature source Oral, resp. rate 18, height 1.753 m (_0 ), weight (!) 170 kg (374 lb 12.5 oz), SpO2 98 %.        Cognition/Neuro Status  Arousal/Alertness: Appropriate responses to stimuli  Attention Span: Appears intact  Following Commands: Follows all commands and directions without difficulty  Safety Awareness: minimal verbal instruction  Insights: Educated in Surveyor, mining;Fully aware of deficits  Behavior: anxious;calm;cooperative (anxious w/ extended OOB mobility)         Functional Mobility  Sit to Stand: Contact  Guard Assist;Increased Time;Increased Effort;with instruction for hand placement to increase safety (bedside chair, pt rocking fwd<>bwd for momentum)  Stand to Sit: [employer] Assist (instruction for hand placement, eccentric control)     Locomotion  Ambulation: Contact Guard Assist;with front-wheeled walker  Pattern: Wide BOS;decreased step length;decreased cadence  Distance Walked (ft) (Step 6,7): 25 Feet    Therapeutic Exercise  Manual Stretch: RLE;LLE (in sitting, hamstring st 69mn ea)  Knee AROM : seated LAQ's: x10 L/R  Seated heel toe raises: x20 B       Neuro Re-Ed  Standing Balance: with instruction;with support;stand by assist (RW)           Educated the Patient to role of physical therapy, plan of  care, goals of therapy and HEP, safety with mobility and ADLs, pursed lip breathing with verbalized understanding  and demonstrated understanding.    Patient left in bedside chair with all medical equipment in place and call bell and all personal items/needs within reach (of note, pt received without alarm in place).  RN notified of session outcome.        Assessment:    Pt w/slow progression toward functional mobility. Pt deconditioned w/sever obesity, ROM and mobility limited in part to body habitus. T/o session pt remains on 2-3lt O2 via NC (O2 appears mid/upper 90's); Labored breathing at rest or with little activity, ongoing DOE requiring frequent rest/ recovery. Pt ambulating in room w/RW c/o onset>inc feeling lightheaded, symptoms slowly improving w/seated rest. Patient would continue to benefit from skilled physical therapy to address deficits and increase functional independence.        PMP Activity: Step 6 - Walks in Room  Distance Walked (ft) (Step 6,7): 25 Feet    Plan:  Treatment/Interventions: Exercise, Gait training, Stair training, Functional transfer training, LE strengthening/ROM, Patient/family training, Equipment eval/education      PT Frequency: 2-3x/wk   Continue plan of care.    Goals:  Goals  Goal Formulation: With patient  Time for Goal Acheivement: By time of discharge  Goals: Select goal  Pt Will Perform Sit to Stand: with supervision, to maximize functional mobility and independence  Pt Will Ambulate: 51-100 feet, with rolling walker, with supervision  Pt Will Go Up / Down Stairs: 1 flight, modified independent, With rail, to maximize functional mobility and independence  Pt Will Perform Home Exer Program: independent, to maximize functional mobility and independence      [place] LRio OsoLic# [id number]   [facility] [phone]     03/31/2021 12:24 PM

## 2021-03-31 NOTE — Plan of Care (Addendum)
Patient alert and oriented. BP very soft overnight. Complained of back pain twice. Pain well managed with oxycodone given twice. No acute event at night. Call light within reach. Continue monitoring.    Problem: Moderate/High Fall Risk Score >5  Goal: Patient will remain free of falls  Outcome: Progressing  Flowsheets (Taken 03/30/2021 2200)  High (Greater than 13):  Marland Kitchen HIGH-Visual cue at entrance to patient's room  . HIGH-Bed alarm on at all times while patient in bed  . HIGH-Utilize chair pad alarm for patient while in the chair  . HIGH-Apply yellow "Fall Risk" arm band  . HIGH-Initiate use of floor mats as appropriate  . HIGH-Consider use of low bed     Problem: Safety  Goal: Patient will be free from injury during hospitalization  Outcome: Progressing  Flowsheets (Taken 03/30/2021 1626 by Dierdre Highman, RN)  Patient will be free from injury during hospitalization:  . Assess patient's risk for falls and implement fall prevention plan of care per policy  . Provide and maintain safe environment  . Use appropriate transfer methods  . Ensure appropriate safety devices are available at the bedside  . Include patient/ family/ care giver in decisions related to safety  . Hourly rounding  Goal: Patient will be free from infection during hospitalization  Outcome: Progressing  Flowsheets (Taken 03/30/2021 1626 by Dierdre Highman, RN)  Free from Infection during hospitalization:  . Assess and monitor for signs and symptoms of infection  . Monitor lab/diagnostic results  . Monitor all insertion sites (i.e. indwelling lines, tubes, urinary catheters, and drains)     Problem: Pain  Goal: Pain at adequate level as identified by patient  Outcome: Progressing  Flowsheets (Taken 03/30/2021 1626 by Dierdre Highman, RN)  Pain at adequate level as identified by patient:  . Identify patient comfort function goal  . Assess pain on admission, during daily assessment and/or before any "as needed" intervention(s)  . Reassess pain within 30-60  minutes of any procedure/intervention, per Pain Assessment, Intervention, Reassessment (AIR) Cycle  . Evaluate if patient comfort function goal is met  . Offer non-pharmacological pain management interventions  . Include patient/patient care companion in decisions related to pain management as needed     Problem: Compromised Hemodynamic Status  Goal: Vital signs and fluid balance maintained/improved  Outcome: Progressing  Flowsheets (Taken 03/30/2021 1626 by Dierdre Highman, RN)  Vital signs and fluid balance are maintained/improved:  Marland Kitchen Position patient for maximum circulation/cardiac output  . Monitor/assess vitals and hemodynamic parameters with position changes  . Monitor and compare daily weight  . Monitor/assess lab values and report abnormal values     Problem: Inadequate Gas Exchange  Goal: Adequate oxygenation and improved ventilation  Outcome: Progressing  Flowsheets (Taken 03/30/2021 1626 by Dierdre Highman, RN)  Adequate oxygenation and improved ventilation:  . Assess lung sounds  . Monitor SpO2 and treat as needed  . Provide mechanical and oxygen support to facilitate gas exchange  . Teach/reinforce use of incentive spirometer 10 times per hour while awake, cough and deep breath as needed  . Plan activities to conserve energy: plan rest periods  . Consult/collaborate with Respiratory Therapy  . Position for maximum ventilatory efficiency  . Increase activity as tolerated/progressive mobility     Problem: Compromised Tissue integrity  Goal: Damaged tissue is healing and protected  Outcome: Progressing  Flowsheets (Taken 03/29/2021 0503 by Pincus Sanes, RN)  Damaged tissue is healing and protected:  Marland Kitchen Monitor/assess Braden scale every shift  . Provide wound  care per wound care algorithm  . Reposition patient every 2 hours and as needed unless able to reposition self  . Increase activity as tolerated/progressive mobility  . Relieve pressure to bony prominences for patients at moderate and high risk  Goal:  Nutritional status is improving  Outcome: Progressing  Flowsheets (Taken 03/26/2021 2050 by Martinique, Caitlin, RN)  Nutritional status is improving:  . Allow adequate time for meals  . Encourage patient to take dietary supplement(s) as ordered  . Collaborate with Clinical Nutritionist  . Include patient/patient care companion in decisions related to nutrition

## 2021-03-31 NOTE — Progress Notes (Signed)
PULMONARY PROGRESS NOTE                                                                                                              [phone]    Date Time: 03/31/21 12:47 PM  Patient Name: [name], [name] 50 y.o. female admitted with SOB (shortness of breath)  Admit Date: 03/22/2021    Patient status: Inpatient  Hospital Day: 9           Assessment:     . Acute exacerbation of chronic obstructive pulmonary disease  . Hypoxic respiratory insufficiency  . Morbid obesity  . Obstructive sleep apnea  . Pulmonary hypertension  . Tobacco abuse    Plan:     Discharge planning in progress  Arrange for home CPAP  Arrange for home oxygen  .       Subjective:         Patient still requiring supplemental oxygen no new complaints alert and awake uses CPAP at night          Medications:     Current Facility-Administered Medications   Medication Dose Route Frequency   . albuterol-ipratropium  3 mL Nebulization BID   . cefTRIAXone  1 g Intravenous Q24H   . cetirizine  10 mg Oral Daily   . famotidine  20 mg Oral BID   . fluticasone furoate-vilanterol  1 puff Inhalation QAM   . heparin (porcine)  5,000 Units Subcutaneous Q8H [place]   . indomethacin  50 mg Oral BID AC   . lidocaine  1 patch Transdermal Q24H   . montelukast  10 mg Oral QHS   . nicotine  1 patch Transdermal Daily   . oxybutynin  10 mg Oral TID   . predniSONE  50 mg Oral QAM W/BREAKFAST   . risperiDONE  3 mg Oral BID   . sertraline  100 mg Oral BID       Review of Systems:        General ROS:  Afebrile    ENT ROS:  No sore throat no nasal discharge   Endocrine ROS:   fatigue   Respiratory ROS: Minimal shortness of breath    Decreased breath sound no wheezing cardiovascular ROS:  No chest pain or palpitation   Gastrointestinal ROS:  No nausea vomiting diarrhea.  No melanotic stool   Genito-Urinary ROS:  No burning in the urine or hematuria   Musculoskeletal ROS:  No musculoskeletal deformities   Neurological ROS:  No stroke seizure disorder   Dermatological ROS:   No skin rash        Physical Exam:     Vitals:    03/31/21 1238   BP: 98/63   Pulse: 69   Resp: 18   Temp: 98.2 F (36.8 C)   SpO2: 98%         Intake/Output Summary (Last 24 hours) at 03/31/2021 1247  Last data filed at 03/31/2021 1020  Gross per 24 hour   Intake 1990 ml   Output 3800 ml   Net -1810  ml           General appearance -minimal shortness of breath with exertion   mental status -  Alert and oriented x3  Eyes - EOMI PERRLA  Nose - no nasal discharge  Mouth - mucous membrane is moist.    Neck - no JVD lymphadenopathy or thyromegaly  Chest -decreased breath sounds no wheezing heard  Heart - S1-S2 RRR no S3-S4 no murmur  Abdomen - soft nontender bowel sounds are normal with no hepatosplenomegaly  Neurological - no motor or sensory deficit  Extremities - no edema clubbing or cyanosis  Skin - no skin rash      Labs:     CBC w/Diff CMP   Recent Labs   Lab 03/31/21  0404 03/30/21  0251 03/29/21  0401   WBC 17.88* 17.21* 15.78*   Hgb 12.0 12.6 13.1   Hematocrit 37.2 39.0 39.9   Platelets 319 337 354*   MCV 90.7 90.1 89.7       PT/INR         Recent Labs   Lab 03/31/21  0404 03/30/21  0251 03/29/21  0401   Sodium 135* 134* 133*   Potassium 4.6 4.6 4.7   Chloride 98* 98* 99*   CO2 _0 BUN 19.0 20.0* 20.0*   Creatinine 0.6 0.7 0.6   Glucose 97 96 120*   Calcium 9.6 9.8 10.4      Glucose POCT   Recent Labs   Lab 03/31/21  0404 03/30/21  0251 03/29/21  0401 03/28/21  0359 03/27/21  0641 03/26/21  0404 03/25/21  0430   Glucose 97 96 120* 119* 150* 150* 163*                ABGs:    ABG CollectionSite   Date Value Ref Range Status   03/29/2021 Right Radl  Final     Allen's Test   Date Value Ref Range Status   03/29/2021 Yes  Final     pH, Arterial   Date Value Ref Range Status   03/29/2021 7.405 7.350 - 7.450 Final     pCO2, Arterial   Date Value Ref Range Status   03/29/2021 40.4 35.0 - 45.0 mmHg Final     pO2, Arterial   Date Value Ref Range Status   03/29/2021 114.0 (H) 80.0 - 90.0 mmHg Final     HCO3, Arterial    Date Value Ref Range Status   03/29/2021 24.8 23.0 - 29.0 mEq/L Final     Base Excess, Arterial   Date Value Ref Range Status   03/29/2021 0.6 -2.0 - 2.0 mEq/L Final     O2 Sat, Arterial   Date Value Ref Range Status   03/29/2021 98.2 95.0 - 100.0 % Final       Urinalysis        Invalid input(s): LEUKOCYTESUR      Rads:   No results found.      Quillian Quince MD  03/31/2021  12:47 PM

## 2021-03-31 NOTE — Progress Notes (Signed)
Pt was discharged from Unit 21, picked up by PTS at 1700. IV and telemetry were removed. VSS. Medication, discharge and follow-up instructions were given to the patient. All questions and concerns were answered. Pt was able to teach back education. Medication prescriptions were sent to pharmacy. All belongings were with the patient upon discharge including CPAP machine.

## 2021-03-31 NOTE — Plan of Care (Signed)
Problem: Moderate/High Fall Risk Score >5  Goal: Patient will remain free of falls  03/31/2021 0845 by Maree Erie, RN  Outcome: Progressing  Flowsheets (Taken 03/30/2021 2200)  High (Greater than 13):   HIGH-Visual cue at entrance to patient's room   HIGH-Bed alarm on at all times while patient in bed   HIGH-Utilize chair pad alarm for patient while in the chair   HIGH-Apply yellow "Fall Risk" arm band   HIGH-Initiate use of floor mats as appropriate   HIGH-Consider use of low bed  03/31/2021 0840 by Maree Erie, RN  Outcome: Progressing  Flowsheets (Taken 03/30/2021 2200)  High (Greater than 13):   HIGH-Visual cue at entrance to patient's room   HIGH-Bed alarm on at all times while patient in bed   HIGH-Utilize chair pad alarm for patient while in the chair   HIGH-Apply yellow "Fall Risk" arm band   HIGH-Initiate use of floor mats as appropriate   HIGH-Consider use of low bed     Problem: Safety  Goal: Patient will be free from injury during hospitalization  03/31/2021 0845 by Maree Erie, RN  Outcome: Progressing  Flowsheets (Taken 03/30/2021 1626 by Dierdre Highman, RN)  Patient will be free from injury during hospitalization:   Assess patient's risk for falls and implement fall prevention plan of care per policy   Provide and maintain safe environment   Use appropriate transfer methods   Ensure appropriate safety devices are available at the bedside   Include patient/ family/ care giver in decisions related to safety   [place]our[place] rounding  03/31/2021 0840 by Maree Erie, RN  Outcome: Progressing  Flowsheets (Taken 03/30/2021 1626 by Dierdre Highman, RN)  Patient will be free from injury during hospitalization:   Assess patient's risk for falls and implement fall prevention plan of care per policy   Provide and maintain safe environment   Use appropriate transfer methods   Ensure appropriate safety devices are available at the bedside   Include patient/ family/  care giver in decisions related to safety   Hourly rounding  Goal: Patient will be free from infection during hospitalization  03/31/2021 0845 by Darrow Bussing, Johnston Ebbs, RN  Outcome: Progressing  Flowsheets (Taken 03/30/2021 1626 by Dierdre Highman, RN)  Free from Infection during hospitalization:   Assess and monitor for signs and symptoms of infection   Monitor lab/diagnostic results   Monitor all insertion sites (i.e. indwelling lines, tubes, urinary catheters, and drains)  03/31/2021 0840 by Maree Erie, RN  Outcome: Progressing  Flowsheets (Taken 03/30/2021 1626 by Dierdre Highman, RN)  Free from Infection during hospitalization:   Assess and monitor for signs and symptoms of infection   Monitor lab/diagnostic results   Monitor all insertion sites (i.e. indwelling lines, tubes, urinary catheters, and drains)     Problem: Pain  Goal: Pain at adequate level as identified by patient  03/31/2021 0845 by Darrow Bussing, Johnston Ebbs, RN  Outcome: Progressing  Flowsheets (Taken 03/30/2021 1626 by Dierdre Highman, RN)  Pain at adequate level as identified by patient:   Identify patient comfort function goal   Assess pain on admission, during daily assessment and/or before any "as needed" intervention(s)   Reassess pain within 30-60 minutes of any procedure/intervention, per Pain Assessment, Intervention, Reassessment (AIR) Cycle   Evaluate if patient comfort function goal is met   Offer non-pharmacological pain management interventions   Include patient/patient care companion in decisions related to pain management as  needed  03/31/2021 0840 by Maree Erie, RN  Outcome: Progressing  Flowsheets (Taken 03/30/2021 1626 by Dierdre Highman, RN)  Pain at adequate level as identified by patient:   Identify patient comfort function goal   Assess pain on admission, during daily assessment and/or before any "as needed" intervention(s)   Reassess pain within 30-60 minutes of any procedure/intervention, per  Pain Assessment, Intervention, Reassessment (AIR) Cycle   Evaluate if patient comfort function goal is met   Offer non-pharmacological pain management interventions   Include patient/patient care companion in decisions related to pain management as needed     Problem: Compromised Hemodynamic Status  Goal: Vital signs and fluid balance maintained/improved  03/31/2021 0845 by Maree Erie, RN  Outcome: Progressing  Flowsheets (Taken 03/30/2021 1626 by Dierdre Highman, RN)  Vital signs and fluid balance are maintained/improved:   Position patient for maximum circulation/cardiac output   Monitor/assess vitals and hemodynamic parameters with position changes   Monitor and compare daily weight   Monitor/assess lab values and report abnormal values  03/31/2021 0840 by Maree Erie, RN  Outcome: Progressing  Flowsheets (Taken 03/30/2021 1626 by Dierdre Highman, RN)  Vital signs and fluid balance are maintained/improved:   Position patient for maximum circulation/cardiac output   Monitor/assess vitals and hemodynamic parameters with position changes   Monitor and compare daily weight   Monitor/assess lab values and report abnormal values     Problem: Inadequate Gas Exchange  Goal: Adequate oxygenation and improved ventilation  03/31/2021 0845 by Maree Erie, RN  Outcome: Progressing  Flowsheets (Taken 03/30/2021 1626 by Dierdre Highman, RN)  Adequate oxygenation and improved ventilation:   Assess lung sounds   Monitor SpO2 and treat as needed   Provide mechanical and oxygen support to facilitate gas exchange   Teach/reinforce use of incentive spirometer 10 times per hour while awake, cough and deep breath as needed   Plan activities to conserve energy: plan rest periods   Consult/collaborate with Respiratory Therapy   Position for maximum ventilatory efficiency   Increase activity as tolerated/progressive mobility  03/31/2021 0840 by Maree Erie, RN  Outcome:  Progressing  Flowsheets (Taken 03/30/2021 1626 by Dierdre Highman, RN)  Adequate oxygenation and improved ventilation:   Assess lung sounds   Monitor SpO2 and treat as needed   Provide mechanical and oxygen support to facilitate gas exchange   Teach/reinforce use of incentive spirometer 10 times per hour while awake, cough and deep breath as needed   Plan activities to conserve energy: plan rest periods   Consult/collaborate with Respiratory Therapy   Position for maximum ventilatory efficiency   Increase activity as tolerated/progressive mobility     Problem: Compromised Tissue integrity  Goal: Damaged tissue is healing and protected  03/31/2021 0845 by Maree Erie, RN  Outcome: Progressing  Flowsheets (Taken 03/29/2021 0503 by Pincus Sanes, RN)  Damaged tissue is healing and protected:   Monitor/assess Braden scale every shift   Provide wound care per wound care algorithm   Reposition patient every 2 hours and as needed unless able to reposition self   Increase activity as tolerated/progressive mobility   Relieve pressure to bony prominences for patients at moderate and high risk  03/31/2021 0840 by Maree Erie, RN  Outcome: Progressing  Flowsheets (Taken 03/29/2021 0503 by Pincus Sanes, RN)  Damaged tissue is healing and protected:   Monitor/assess Braden scale every shift   Provide wound care per wound  care algorithm   Reposition patient every 2 hours and as needed unless able to reposition self   Increase activity as tolerated/progressive mobility   Relieve pressure to bony prominences for patients at moderate and high risk  Goal: Nutritional status is improving  03/31/2021 0845 by Maree Erie, RN  Outcome: Progressing  Flowsheets (Taken 03/26/2021 2050 by Martinique, Caitlin, RN)  Nutritional status is improving:   Allow adequate time for meals   Encourage patient to take dietary supplement(s) as ordered   Collaborate with Clinical Nutritionist   Include  patient/patient care companion in decisions related to nutrition  03/31/2021 0840 by Maree Erie, RN  Outcome: Progressing  Flowsheets (Taken 03/26/2021 2050 by Martinique, Caitlin, RN)  Nutritional status is improving:   Allow adequate time for meals   Encourage patient to take dietary supplement(s) as ordered   Collaborate with Clinical Nutritionist   Include patient/patient care companion in decisions related to nutrition

## 2021-03-31 NOTE — Progress Notes (Addendum)
Call to [facility], [phone], to follow-up on Respiratory Therapist visit and CPAP delivery today at the hospital.  While on hold, Dr. Mallie Darting informed CM that the Respiratory Therapist was present in patient's room and brought the CPAP.  Call with [facility] ended.    Call to [facility], [phone], to determine availability of a bariatric stretcher.  The bariatric team is available to pickup patient at 4:30PM under SCM.  CM will fax the Patient's Transport Form to [phone].  Dr. Mallie Darting, RN and patient updated.      Memory Argue, MSW, ACM-SW  Social Worker Case Manager [facility]  [phone]

## 2021-03-31 NOTE — Discharge Summary (Signed)
[facility]      Patient: [name]  Admission Date: 03/22/2021   DOB: [birth year]  Discharge Date: 03/31/2021    MRN: [id number]  Discharge Attending:Kaevon Cotta Karmen Stabs, MD     Referring Physician: Virgel Bouquet, MD  PCP: Virgel Bouquet, MD       DISCHARGE SUMMARY     Discharge Information   Admission Diagnosis:   COPD with acute exacerbation    Discharge Diagnosis:   Active Hospital Problems    Diagnosis   . Chronic respiratory failure   . Interstitial pneumonitis   . Severe pulmonary hypertension   . Hyperkalemia   . Altered mental status   . Hypercarbia   . Chronic back pain   . Morbid obesity with BMI of 50.0-59.9, adult   . Tobacco dependence   . GERD (gastroesophageal reflux disease)   . COPD with acute exacerbation   . OSA (obstructive sleep apnea)   . Anxiety and depression        Admission Condition: Guarded  Discharge Condition: Stable  Consultants: Pulmonology  Functional Status: Able to ambulate 25 feet with walker and assistance  Discharged to: Home with supervision, home PT, home OT, home health    Discharge Medications:     Medication List      START taking these medications    famotidine 20 MG tablet  Commonly known as: PEPCID  Take 1 tablet (20 mg total) by mouth 2 (two) times daily     fluticasone furoate-vilanterol 200-25 MCG/INH Aepb  Commonly known as: BREO ELLIPTA  Inhale 1 puff into the lungs daily     indomethacin 50 MG capsule  Commonly known as: INDOCIN  Take 1 capsule (50 mg total) by mouth 2 (two) times daily before meals     lidocaine 5 %  Commonly known as: LIDODERM  Place 1 patch onto the skin every 24 hours Remove & Discard patch within 12 hours or as directed by MD  Start taking on: Apr 01, 2021     montelukast 10 MG tablet  Commonly known as: SINGULAIR  Take 1 tablet (10 mg total) by mouth nightly     nicotine 14 MG/24HR  Commonly known as: NICODERM CQ  Place 1 patch onto the skin daily  Start taking on: Apr 01, 2021     oxyCODONE 5 MG immediate release tablet  Commonly  known as: ROXICODONE  Take 1 tablet (5 mg total) by mouth every 6 (six) hours as needed for Pain     predniSONE 20 MG tablet  Commonly known as: DELTASONE  Take 2 tablets (40 mg total) by mouth daily for 2 days, THEN 1 tablet (20 mg total) daily for 2 days, THEN 0.5 tablets (10 mg total) daily for 2 days.  Start taking on: Mar 31, 2021        CONTINUE taking these medications    albuterol sulfate HFA 108 (90 Base) MCG/ACT inhaler  Commonly known as: PROVENTIL  Inhale 2 puffs into the lungs every 4 (four) hours as needed for Wheezing     ALPRAZolam 0.5 MG tablet  Commonly known as: XANAX     atomoxetine 10 MG capsule  Commonly known as: STRATTERA     benzonatate 200 MG capsule  Commonly known as: TESSALON  Take 1 capsule (200 mg total) by mouth 3 (three) times daily as needed for Cough     fluticasone-salmeterol 500-50 MCG/DOSE Aepb  Commonly known as: ADVAIR DISKUS  Inhale 1 puff into the lungs 2 (  two) times daily     gabapentin 800 MG tablet  Commonly known as: NEURONTIN     naloxone 4 MG/0.1ML nasal spray  Commonly known as: NARCAN  1 spray intranasally. If pt does not respond or relapses into respiratory depression call 911. Give additional doses every 2-3 min.     oxybutynin 5 MG tablet  Commonly known as: DITROPAN  Take 2 tablets (10 mg total) by mouth 3 (three) times daily     risperiDONE 3 MG tablet  Commonly known as: RisperDAL     sertraline 100 MG tablet  Commonly known as: ZOLOFT        STOP taking these medications    cyclobenzaprine 10 MG tablet  Commonly known as: FLEXERIL     oxyCODONE-acetaminophen 10-325 MG per tablet  Commonly known as: PERCOCET           Where to Get Your Medications      These medications were sent to [id number] D[place] Keel, MD - [place] [facility] [address], [place] [address]    Phone: [phone]    famotidine 20 MG tablet   fluticasone furoate-vilanterol 200-25 MCG/INH Aepb   indomethacin 50 MG  capsule   lidocaine 5 %   montelukast 10 MG tablet   nicotine 14 MG/24HR   oxybutynin 5 MG tablet   oxyCODONE 5 MG immediate release tablet   predniSONE 20 MG tablet             Hospital Course   Presentation History   As per admitting provider, "50 y.o. 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      Patient states that she is having shortness of breath since this morning, associated with wheezing, cough (unable to cough out mucus) and chest tightness.  Patient also reports seasonal allergies, admits to smoking 1 pack of cigarettes daily, denies pets at home.  Patient states that she is not COVID vaccinated.  Patient denies prior intubation, however admitted with COPD exacerbation in November, 2021.  Patient states that she takes Xanax, high dose of gabapentin 800 mg 3 times daily and high dose of Percocet for back pain daily. Patient denies fever, chills, palpitation, nausea, vomiting, change in urinary/bowel habit."    See HPI for details.    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  Enlarged main pulmonary trunk suggestive of underlying pulmonary arterial hypertension. CTA chest without definite evidence of PE, again notes bilateral ground glass infiltrates.  CXR 5/8: persistent mildly improved air space disease  -Status post IV Solumedrol , continue with oral prednisone taper  -Patient initially started on ceftazidime which  changed to azithromycin + ceftriaxone.  Patient will be discharged home without any antibiotics  -Antitussives as needed  -BNP and serial troponins wnl, Echocardiogram EF 60-65%, mild TR, moderate pulm htn  -ESR and CRP elevated, ANA and Rh negative. HIV non-reactive.  -Sputum culture contaminated, RVP negative  -Patient to follow-up with pulmonology as an outpatient  -smoking  cessation       OSA, untreated  -Patient with history of obstructive sleep apnea diagnosed by sleep study 3 years ago.  Patient was compliant with CPAP at home until it broke  -Patient's CPAP arrived to the hospital and patient will be discharged home in hemodynamically stable condition with her CPAP  -As per pulmonology, patient's condition may deteriorate if patient does not receive CPAP for more than 24 to 48 hours due to her morbid obesity and sleep apnea      Hyperkalemia  -Improved with kayexalate  -Follow-up as an outpatient    Altered mental status, labile  Hypercarbia   -Patient with increased somnolence in the morning that improves by the afternoon. Suspect secondary to CO2 retention overnight, worsened by oxycodone/gabapentin.    -Continue with CPAP at nighttime    Chronic back pain  -Continue home indomethacin. Continue gabapentin and oxycodone at lower dose     Morbid obesity BMI 54  -Lifestyle modification with diet and exercise on discharge    Anxiety/depression  -Continue home Xanax, risperidone, and sertraline. Atomoxetine     Tobacco dependence  -Cessation advised, continue nicotine patch. prescription given on discharge    GERD  -Pepcid           Procedures/Imaging:   CT Chest without Contrast    Result Date: 03/22/2021  Patchy groundglass like opacities following the bronchovascular bundles throughout the lung fields bilaterally, have increased from the prior study. Differential diagnosis includes but is not limited to NSIP, hypersensitivity and drug induced pneumonitis, and atypical pneumonia. Stable mild  cylindrical bronchiectasis, with parenchymal scarring and honeycombing within the lung apices. Enlarged main pulmonary trunk is suggestive of underlying pulmonary arterial hypertension. Status post cholecystectomy. Timmie Foerster MD, MD  03/22/2021 11:02 PM    CT Angiogram Chest    Result Date: 03/23/2021   There is suboptimal opacification of the pulmonary arteries particularly in the upper lobes. No definite pulmonary embolism is noted. Bilateral groundglass infiltrates Steward Drone, MD  03/23/2021 10:48 PM    XR Chest AP Portable    Result Date: 03/27/2021   Persistent but mildly improved interstitial and airspace disease. Stable cardiomegaly. [name], MD  03/27/2021 8:38 AM    XR Chest AP Portable    Result Date: 03/24/2021   Progressive diffuse lung disease Elyn Peers, MD  03/24/2021 10:20 AM    Chest AP Portable    Result Date: 03/22/2021   Pulmonary edema. [name], MD  03/22/2021 7:44 PM         Best Practices   Was the patient admitted with either a CHF Exacerbation or Pneumonia?  No     Progress Note/Physical Exam at Discharge     Subjective: Today patient reports that she feels much better.  Patient is eager to go home currently denies any fever chills nausea vomiting diarrhea chest pain shortness of breath headache lightheadedness or dizziness    Vitals:    03/31/21 0927 03/31/21 1000 03/31/21 1238 03/31/21 1507   BP:   98/63 107/68   Pulse: 65  69 75   Resp:   18 20   Temp: 99.1 F (37.3 C)  98.2 F (36.8 C) 98.1 F (36.7 C)   TempSrc: Oral  Oral Oral   SpO2: 92% 93% 98% 96%   Weight:       Height:           GEN APPEARANCE: Awake, NAD.  Morbidly obese  HEENT: PERLA; EOMI; Conjunctiva Clear  NECK:  Supple; No bruits  CVS: RRR, S1, S2; No M/G/R  LUNGS:  Improving breath sounds bilaterally, no rales, wheezing or rhonchi  ABD: Obese abdomen, Soft; No TTP; + Normoactive BS  EXT: No edema; Pulses 2+ and intact  Skin exam:  no pallor  NEURO: CN 2-12 intact; No Focal neurological deficits  CAP REFILL:  Normal  MENTAL  STATUS:  Normal         Diagnostics     Labs/Studies Pending at Discharge: No    Last Labs   Recent Labs   Lab 03/31/21  0404 03/30/21  0251 03/29/21  0401   WBC 17.88* 17.21* 15.78*   RBC 4.10 4.33 4.45   Hgb 12.0 12.6 13.1   Hematocrit 37.2 39.0 39.9   MCV 90.7 90.1 89.7   Platelets 319 337 354*       Recent Labs   Lab 03/31/21  0404 03/30/21  0251 03/29/21  0401 03/28/21  0359 03/27/21  0641   Sodium 135* 134* 133* 134* 136   Potassium 4.6 4.6 4.7 4.9 4.7   Chloride 98* 98* 99* 100 102   CO2 _0 BUN 19.0 20.0* 20.0* 17.0 17.0   Creatinine 0.6 0.7 0.6 0.6 0.6   Glucose 97 96 120* 119* 150*   Calcium 9.6 9.8 10.4 9.9 10.3       Microbiology Results (last 15 days)     Procedure Component Value Units Date/Time    Respiratory Pathogen Panel w/COVID-19, PCR [[id number]] Collected: 03/24/21 1307    Order Status: Completed Specimen: Nasopharyngeal Updated: 03/25/21 0512     Adenovirus Not Detected     Coronavirus 229E Not Detected     Coronavirus HKU1 Not Detected     Coronavirus NL63 Not Detected     Coronavirus OC43 Not Detected     SARS CoV-2 RNA Not Detected     Human Metapneumovirus Not Detected     Human Rhinovirus/Enterovirus Not Detected     Influenza A Not Detected     Influenza A/H1 Not Detected     Influenza AH1 - 2009 Not Detected     Influenza A/H3 Not Detected     Influenza B Not Detected     Parainfluenza Virus 1 Not Detected     Parainfluenza Virus 2 Not Detected     Parainfluenza Virus 3 Not Detected     Parainfluenza Virus 4 Not Detected     Respiratory Syncytial Virus Not Detected     Bordetella pertussis Not Detected     Bordetella parapertussis PCR Not Detected     Chlamydophila pneumoniae Not Detected     Mycoplasma pneumoniae Not Detected     Source NP Swab     Test Comment for Respiratory Panel with COVID See Below     Comment: Testing performed using the [employer] Film[employer]  Respiratory Panel (RP 2.1)  This test is for the qualitative detection of  respiratory pathogen nucleic acid.  This assay cannot  differentiate between Rhinovirus/Enterovirus. If  necessary for patient care, a positive result for  Rhinovirus/Enterovirus may be followed-up using an  alternate method. This assay should not be used if  B. pertussis infection is specifically suspected as it  is less sensitive than other alternatives. If suspected,  ensure that B. pertussis specific testing is ordered.  Recent administration of a nasal influenza vaccine may  cause false positive results for Influenza A and/or  Influenza B.  Viral and bacterial nucleic acids may persist even  though no viable organism is present. Detection of  nucleic acid does not imply that the corresponding  organisms are infectious or are the causative agents of  clinical symptoms. Positive results of this test do not  rule out coinfection with other organisms. A negative  result does not exclude the possibility of viral or  bacterial infection.  Assay performance characteristics  may vary with circulating strains and this assay may not  be able to distinguish between existing viral strains  and new variants as they emerge. Results of this test  should not be used as the sole basis for diagnosis,  treatment, or other patient management decisions.  This assay is FDA authorized for nasopharyngeal swab  samples.  Performance characteristics for  Bronchoalveolar lavage samples have been determined by  the [facility] laboratory. Other sample types are unacceptable.  Invalid results may be due to inhibiting substances in  the specimen and recollection should occur.          Purpose of COVID testing Diagnostic -PUI    Narrative:      For NP, Call laboratory for VTM NP Swab collection device.  Bronchoalveolar Lavage is submitted in a sterile container.  Diagnostic -PUI    CULTURE + Lacretia Leigh [[id number]] Collected: 03/23/21 2208    Order Status: Completed Specimen: Sputum, Expectorated Updated: 03/24/21 0620    Narrative:      Results Faxed  [phone], by [id number] on 03/24/2021 at 06:20  Culture and Gram Stain, Aerobic, Respiratory was cancelled on 03/24/21 at  06:20 by [phone]; Specimen quality is inadequate for culture  Specimen appears  to be saliva  Please submit another sputum specimen  Induction of sputum may  improve specimen quality  ORDER#: [id number]                                    ORDERED BY: JABBAR, LUBNA  SOURCE: Sputum, Expectorated as below                COLLECTED:  03/23/21 22:08  ANTIBIOTICS AT COLL.:                                RECEIVED :  03/24/21 01:47  Results Faxed [phone], by [id number] on 03/24/2021 at 06:20  Culture and Gram Stain, Aerobic, Respiratory was cancelled on 03/24/21 at 06:20 by [id number]; Specimen quality is inadequate for  culture  Specimen appears to be saliva  Please submit another sputum specimen  Induction of sputum may improve specimen quality  Stain, Gram (Respiratory)                  FINAL       03/24/21 06:20  03/24/21   Smear contains > or = 10 squamous epithelial cells per low             power field, suggestive of significant oral contamination and             poor quality specimen. Culture not performed.             Please recollect if clinically indicated.  Culture and Gram Stain, Aerobic, RespiratorCANCELLED   03/24/21 06:20            - cancelled on 03/24/21 06:20   by  [id number]   Specimen quality is inadequate for culture  Specimen appears to be saliva  Please submit another sputum specimen  Induction of sputum may improve specimen   quality      Culture Blood Aerobic and Anaerobic [[id number]] Collected: 03/22/21 2349    Order Status: Completed Specimen: Arm from Blood, Venipuncture Updated: 03/28/21 0821    Narrative:      ORDER#: [id number]                                    ORDERED BY: HE, ALBERT  SOURCE: Blood, Venipuncture Arm                      COLLECTED:  03/22/21 23:49  ANTIBIOTICS AT COLL.:                                RECEIVED :  03/23/21 05:29  Culture Blood Aerobic and Anaerobic         FINAL       03/28/21 08:21  03/28/21   No growth after 5 days of incubation.      Culture Blood Aerobic and Anaerobic [[id number]] Collected: 03/22/21 2349    Order Status: Completed Specimen: Arm from Blood, Venipuncture Updated: 03/28/21 0821    Narrative:      ORDER#: [id number]                                    ORDERED BY: HE, ALBERT  SOURCE: Blood, Venipuncture Arm                      COLLECTED:  03/22/21 23:49  ANTIBIOTICS AT COLL.:                                RECEIVED :  03/23/21 05:29  Culture Blood Aerobic and Anaerobic        FINAL       03/28/21 08:21  03/28/21   No growth after 5 days of incubation.      COVID-19 (SARS-CoV-2) and Influenza A/B, NAA (Liat Rapid)- Admission [[id number]] Collected: 03/22/21 2227    Order Status: Completed Specimen: Culturette from Nasopharyngeal Updated: 03/22/21 2304     Purpose of COVID testing Diagnostic -PUI     SARS-CoV-2 Specimen Source Nasal Swab     SARS CoV 2 Overall Result Not Detected     Comment: __________________________________________________  -A result of "Detected" indicates POSITIVE for the    presence of SARS CoV-2 RNA  -A result of "Not Detected" indicates NEGATIVE for the    presence of SARS CoV-2 RNA  __________________________________________________________  Test performed using the Roche cobas Liat SARS-CoV-2 assay. This assay is  only for use under the Food and Drug Administration's Emergency Use  Authorization. This is a real-time RT-PCR assay for the qualitative  detection of SARS-CoV-2 RNA. Viral nucleic acids may persist in vivo,  independent of viability. Detection of viral nucleic acid does not imply the  presence of infectious virus, or that virus nucleic acid is the cause of  clinical symptoms. Negative results do not preclude SARS-CoV-2 infection and  should not be used as the sole basis for diagnosis, treatment or other  patient management decisions. Negative results must be combined with  clinical observations, patient history,  and/or epidemiological  information.  Invalid results may be due to inhibiting substances in the specimen and  recollection should occur. Please see Fact Sheets for patients and providers  located:  https://www.benson-chung.com/          Influenza A Not Detected     Influenza B Not Detected     Comment: Test performed using the Roche cobas Liat SARS-CoV-2 & Influenza A/B assay.  This assay is only for use under the Food and Drug Administration's  Emergency Use Authorization. This is a multiplex real-time RT-PCR assay  intended for the simultaneous in vitro qualitative detection and  differentiation of SARS-CoV-2, influenza A, and influenza B virus RNA. Viral  nucleic acids may persist in vivo, independent of viability. Detection of  viral nucleic acid does not imply the presence of infectious virus, or that  virus nucleic acid is the cause of clinical symptoms. Negative results do  not preclude SARS-CoV-2, influenza A, and/or influenza B infection and  should not be used as the sole basis for diagnosis, treatment or other  patient management decisions. Negative results must be combined with  clinical observations, patient history, and/or epidemiological information.  Invalid results may be due to inhibiting substances in the specimen and  recollection should occur. Please see Fact Sheets for patients and providers  located: http://olson-hall.info/.         Narrative:      o Collect and clearly label specimen type:  o PREFERRED-Upper respiratory specimen: One Nasal Swab in  Transport Media.  o Hand deliver to laboratory ASAP  Diagnostic -PUI           Patient Instructions   Discharge Diet: Regular  Discharge Activity: As tolerated    Follow Up Appointment:   Follow-up Information     Herscowitz, Sandy Salaam, MD Follow up in 1 week(s).    Specialty: Pulmonary Disease  Why: copd, osa  Contact information:  [place] [address]  [phone]             Virgel Bouquet, MD .     Specialty: Family Medicine  Contact information:  [address]  [place] [address]  Julian [phone] Follow up.    Why: SN                        Time spent examining patient, discussing with patient/family regarding hospital course, chart review, reconciling medications and discharge planning: > 35 minutes.    Signed,  Marya Landry, MD    6:46 PM 03/31/2021

## 2021-03-31 NOTE — UM Notes (Signed)
[facility] Utilization Review   NPI #[id number], Tax ID [id number]  Please call Milas Gain MSN RN CCM @ [phone] with any questions or concerns.  Email:  [email or web address]  Fax final authorization and requests for additional information to [phone]    CONCURRENT REVIEW FOR: 5/10-5/12      PATIENT NAME: [name],[name] / 12/03/1970 / AGE: 50 y.o.        V/S:    Vital Sign Min/Max (last 24 hours)    Value Min Max   Temp 97.9 F (36.6 C) 99.1 F (37.3 C)   Heart Rate 62 85   Resp Rate 16 22   BP: Systolic 95 861   BP: Diastolic 60 69   FiO2 -- --   SpO2 91 % 100 %     REQUIRING 2L/MIN NC; NOCTURNAL CPAP    Labs -  Lab 03/30/21  0251 03/29/21  0401   WBC 17.21* 15.78*     Lab 03/30/21  0251 03/29/21  0401   Sodium 134* 133*   Chloride 98* 99*   BUN 20.0* 20.0*   Glucose 96 120*        03/31/21 04:04   WBC 17.88 (H)      03/31/21 04:04   Sodium 135 ([name])   Chloride 98 ([name])       MD NOTES:  MEDICINE NOTES  A&P  Acute respiratory distress secondary to COPD exacerbation  Chronic respiratory failure secondary to COPD  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  Enlarged main pulmonary trunk suggestive of underlying pulmonary arterial hypertension. CTA chest without definite evidence of PE, again notes bilateral ground glass infiltrates.  CXR 5/8: persistent mildly improved air space disease  -Status post IV Solumedrol , continue with oral prednisone  -Titrate oxygen to maintain O2 saturations 88 to 92%  -Ceftazidime changed to azithromycin + ceftriaxone  -Continue DuoNebs  -Antitussives as needed  -BNP and serial troponins wnl, Echocardiogram EF 60-65%, mild TR, moderate pulm htn  -ESR and CRP elevated, ANA and Rh negative. HIV non-reactive.  -Sputum culture contaminated, RVP  negative  -Appreciate pulmonology evaluation, likely nonspecific interstitial pneumonitis.   -smoking cessation       OSA, untreated  -Patient with history of obstructive sleep apnea diagnosed by sleep study 3 years ago.  Patient was compliant with CPAP at home until it broke  -Patient is  awaiting to receive CPAP prior to discharge  -As per pulmonology "If patient does not receive CPAP in next 24 to 48-hour due to his morbid obesity and sleep apnea her condition may deteriorate"    Hyperkalemia  -Improved with kayexalate  -Daily BMP    Altered mental status, labile  Hypercarbia   -Patient with increased somnolence in the morning that improves by the afternoon. Suspect secondary to CO2 retention overnight, worsened by oxycodone/gabapentin.    -Continue with CPAP at nighttime    Chronic back pain  -Continue home indomethacin. Continue gabapentin and oxycodone at lower dose     Morbid obesity BMI 54  -Lifestyle modification with diet and exercise on discharge    Anxiety/depression  -Continue home Xanax, risperidone, and sertraline. Atomoxetine non-formulary, resume on discharge    Tobacco dependence  -Cessation advised, continue nicotine patch. Will need prescription on discharge    GERD  -Pepcid     Dysuria  -Trace LE, continue abx as  above      Analgesia: Tylenol, oxycodone, gabapentin    Nutrition: regular    DVT Prophylaxis:Heparin    Current Medications:   Scheduled Meds:  Current Facility-Administered Medications   Medication Dose Route Frequency   . albuterol-ipratropium  3 mL Nebulization BID   . cefTRIAXone  1 g Intravenous Q24H   . cetirizine  10 mg Oral Daily   . famotidine  20 mg Oral BID   . fluticasone furoate-vilanterol  1 puff Inhalation QAM   . heparin (porcine)  5,000 Units Subcutaneous Q8H [place]   . indomethacin  50 mg Oral BID AC   . lidocaine  1 patch Transdermal Q24H   . montelukast  10 mg Oral QHS   . nicotine  1 patch Transdermal Daily   . oxybutynin  10 mg Oral TID   . predniSONE   50 mg Oral QAM W/BREAKFAST   . risperiDONE  3 mg Oral BID   . sertraline  100 mg Oral BID     [facility]  WITH DME (CPAP)

## 2021-03-31 NOTE — Plan of Care (Signed)
Shift Summary:   Pt remained free from falls and injury throughout shift. Safety and fall risk precautions in place  Purposeful rounding implemented. OOB with stand by assistance and walker. Able to ambulate a few steps but stayed in chair for majority of the day. Worked with PT, CPAP machine delivered.    Alert & Oriented: x4   Pain: moderated to severe c/o of back pain, prn pain meds given  Vitals: VSS 96 % on 2L NC  Telemetry: NSR  Assessment: Pt denies HA, dizziness, blurred vision, CP, N/V, diarrhea/constipation. Dyspnea on exertion   VTE Prophylaxis: Heparin  Diet: Regular  D/C Plan: Today at 4:30 per CM note    Care Plan:      Problem: Moderate/High Fall Risk Score >5  Goal: Patient will remain free of falls  Outcome: Progressing  Flowsheets (Taken 03/31/2021 0949)  High (Greater than 13):  Marland Kitchen HIGH-Bed alarm on at all times while patient in bed  . HIGH-Utilize chair pad alarm for patient while in the chair  . HIGH-Apply yellow "Fall Risk" arm band  . HIGH-Pharmacy to initiate evaluation and intervention per protocol  . HIGH-Initiate use of floor mats as appropriate  . HIGH-Consider use of low bed     Problem: Safety  Goal: Patient will be free from injury during hospitalization  Outcome: Progressing  Flowsheets (Taken 03/30/2021 1626)  Patient will be free from injury during hospitalization:  . Assess patient's risk for falls and implement fall prevention plan of care per policy  . Provide and maintain safe environment  . Use appropriate transfer methods  . Ensure appropriate safety devices are available at the bedside  . Include patient/ family/ care giver in decisions related to safety  . Hourly rounding  Goal: Patient will be free from infection during hospitalization  Outcome: Progressing  Flowsheets (Taken 03/30/2021 1626)  Free from Infection during hospitalization:  . Assess and monitor for signs and symptoms of infection  . Monitor lab/diagnostic results  . Monitor all insertion sites (i.e. indwelling  lines, tubes, urinary catheters, and drains)     Problem: Pain  Goal: Pain at adequate level as identified by patient  Outcome: Progressing  Flowsheets (Taken 03/30/2021 1626)  Pain at adequate level as identified by patient:  . Identify patient comfort function goal  . Assess pain on admission, during daily assessment and/or before any "as needed" intervention(s)  . Reassess pain within 30-60 minutes of any procedure/intervention, per Pain Assessment, Intervention, Reassessment (AIR) Cycle  . Evaluate if patient comfort function goal is met  . Offer non-pharmacological pain management interventions  . Include patient/patient care companion in decisions related to pain management as needed     Problem: Side Effects from Pain Analgesia  Goal: Patient will experience minimal side effects of analgesic therapy  Outcome: Progressing  Flowsheets (Taken 03/30/2021 1626)  Patient will experience minimal side effects of analgesic therapy:  . Monitor/assess patient's respiratory status (RR depth, effort, breath sounds)  . Prevent/manage side effects per LIP orders (i.e. nausea, vomiting, pruritus, constipation, urinary retention, etc.)  . Assess for changes in cognitive function  . Evaluate for opioid-induced sedation with appropriate assessment tool (i.e. POSS)     Problem: Discharge Barriers  Goal: Patient will be discharged home or other facility with appropriate resources  Outcome: Progressing  Flowsheets (Taken 03/30/2021 1626)  Discharge to home or other facility with appropriate resources:  . Provide appropriate patient education  . Provide information on available health resources     Problem:  Psychosocial and Spiritual Needs  Goal: Demonstrates ability to cope with hospitalization/illness  Outcome: Progressing  Flowsheets (Taken 03/30/2021 1626)  Demonstrates ability to cope with hospitalizations/illness:  . Encourage verbalization of feelings/concerns/expectations  . Provide quiet environment  . Assist patient to  identify own strengths and abilities  . Include patient/ patient care companion in decisions     Problem: Compromised Hemodynamic Status  Goal: Vital signs and fluid balance maintained/improved  Outcome: Progressing  Flowsheets (Taken 03/30/2021 1626)  Vital signs and fluid balance are maintained/improved:  Marland Kitchen Position patient for maximum circulation/cardiac output  . Monitor/assess vitals and hemodynamic parameters with position changes  . Monitor and compare daily weight  . Monitor/assess lab values and report abnormal values     Problem: Inadequate Gas Exchange  Goal: Adequate oxygenation and improved ventilation  Outcome: Progressing  Flowsheets (Taken 03/30/2021 1626)  Adequate oxygenation and improved ventilation:  . Assess lung sounds  . Monitor SpO2 and treat as needed  . Provide mechanical and oxygen support to facilitate gas exchange  . Teach/reinforce use of incentive spirometer 10 times per hour while awake, cough and deep breath as needed  . Plan activities to conserve energy: plan rest periods  . Consult/collaborate with Respiratory Therapy  . Position for maximum ventilatory efficiency  . Increase activity as tolerated/progressive mobility     Problem: Compromised Tissue integrity  Goal: Damaged tissue is healing and protected  Outcome: Progressing  Flowsheets (Taken 03/29/2021 0503 by Pincus Sanes, RN)  Damaged tissue is healing and protected:  Marland Kitchen Monitor/assess Braden scale every shift  . Provide wound care per wound care algorithm  . Reposition patient every 2 hours and as needed unless able to reposition self  . Increase activity as tolerated/progressive mobility  . Relieve pressure to bony prominences for patients at moderate and high risk  Goal: Nutritional status is improving  Outcome: Progressing  Flowsheets (Taken 03/26/2021 2050 by [place], Caitlin, RN)  Nutritional status is improving:  . Allow adequate time for meals  . Encourage patient to take dietary supplement(s) as ordered  .  Collaborate with Clinical Nutritionist  . Include patient/patient care companion in decisions related to nutrition

## 2021-03-31 NOTE — Progress Notes (Signed)
DISCHARGE: Patient will discharge to home today with [facility] RN, PT, OT and CPAP machine provided by [facility].  [facility], bariatric stretcher, scheduled for 4:30PM.       03/31/21 1341   Discharge Disposition   Patient preference/choice provided? Yes   Physical Discharge Disposition Home, Home Health   Name of [place] [facility] Other (comment box)  ([facility]   Name of [employer]  (CPAP device.)   Mode of Transportation Other (comment)  ([employer], Programme researcher, broadcasting/film/video, under SCM.)   Patient/Family/POA notified of transfer plan Yes   Patient agreeable to discharge plan/expected d/c date? Yes   Family/POA agreeable to discharge plan/expected d/c date? Yes   Bedside nurse notified of transport plan? Yes   [facility];Home PT/OT/ST   CM Interventions   Follow up appointment scheduled? No   Reason no follow up scheduled? Other (comment)  (Patient will schedule as advised.)   Notified MD? Yes   Referral made for home health RN visit? Yes   Multidisciplinary rounds/family meeting before d/c? Yes   Medicare Checklist   Is this a Medicare patient? No   Patient received 1st IMM Letter? [place], MSW, ACM-SW  Social Worker Case Manager [facility]  [phone]

## 2021-04-06 NOTE — Progress Notes (Signed)
Pt called this Probation officer and stated she is missing a small piece/part  of her CPAP machine and needs replacement. [facility] advised pt to call [facility] who provided her the CPAP machine and provided her with phone number.

## 2021-04-12 NOTE — UM Notes (Signed)
Discharge Date -  03/31/2021  5:12 PM    ER ADMIT DATE AND TIME: 03/22/2021  6:59 PM    IP ADMIT DATE: 03/22/21    NAME: [name]             MR#: [id number]      Patient Address:  [address] [place] [address]    Patient phone: [phone] (home)     PATIENT NAME: [name], [name]  DOB: Feb 19, 1971   PMH:  has a past medical history of Anxiety, Chronic obstructive pulmonary disease, and Panic attacks.  PSH:  has a past surgical history that includes Cholecystectomy; APPENDECTOMY (OPEN); and ARTHROSCOPIC KNEE, ACL RECONSTRUCTION.      DIAGNOSIS:     ICD-10-CM    1. OSA (obstructive sleep apnea)  G47.33 CANCELED: C-Pap (DME)   2. SOB (shortness of breath)  R06.02

## 2022-01-18 DEATH — deceased
